# Patient Record
Sex: Female | Born: 1955 | Race: White | Hispanic: No | Smoking: Current every day smoker
Health system: Southern US, Community
[De-identification: ages and names within clinical notes are randomized; demographics above are authoritative.]

## PROBLEM LIST (undated history)

## (undated) DIAGNOSIS — I10 Essential (primary) hypertension: Secondary | ICD-10-CM

## (undated) DIAGNOSIS — R51 Headache: Secondary | ICD-10-CM

## (undated) DIAGNOSIS — M1990 Unspecified osteoarthritis, unspecified site: Secondary | ICD-10-CM

## (undated) DIAGNOSIS — E669 Obesity, unspecified: Secondary | ICD-10-CM

## (undated) DIAGNOSIS — G8929 Other chronic pain: Secondary | ICD-10-CM

## (undated) DIAGNOSIS — I5032 Chronic diastolic (congestive) heart failure: Secondary | ICD-10-CM

## (undated) DIAGNOSIS — R079 Chest pain, unspecified: Secondary | ICD-10-CM

## (undated) DIAGNOSIS — J40 Bronchitis, not specified as acute or chronic: Secondary | ICD-10-CM

## (undated) DIAGNOSIS — J449 Chronic obstructive pulmonary disease, unspecified: Secondary | ICD-10-CM

## (undated) DIAGNOSIS — I219 Acute myocardial infarction, unspecified: Secondary | ICD-10-CM

## (undated) DIAGNOSIS — R0602 Shortness of breath: Secondary | ICD-10-CM

## (undated) DIAGNOSIS — Z559 Problems related to education and literacy, unspecified: Secondary | ICD-10-CM

## (undated) DIAGNOSIS — K219 Gastro-esophageal reflux disease without esophagitis: Secondary | ICD-10-CM

## (undated) DIAGNOSIS — F419 Anxiety disorder, unspecified: Secondary | ICD-10-CM

## (undated) DIAGNOSIS — J45909 Unspecified asthma, uncomplicated: Secondary | ICD-10-CM

## (undated) DIAGNOSIS — Z550 Illiteracy and low-level literacy: Secondary | ICD-10-CM

## (undated) DIAGNOSIS — E039 Hypothyroidism, unspecified: Secondary | ICD-10-CM

## (undated) DIAGNOSIS — R519 Headache, unspecified: Secondary | ICD-10-CM

## (undated) HISTORY — PX: ECTOPIC PREGNANCY SURGERY: SHX613

## (undated) HISTORY — PX: NM MYOVIEW LTD: HXRAD82

## (undated) HISTORY — DX: Obesity, unspecified: E66.9

## (undated) HISTORY — DX: Problems related to education and literacy, unspecified: Z55.9

## (undated) HISTORY — DX: Illiteracy and low-level literacy: Z55.0

## (undated) HISTORY — DX: Acute myocardial infarction, unspecified: I21.9

---

## 1995-12-17 DIAGNOSIS — I219 Acute myocardial infarction, unspecified: Secondary | ICD-10-CM

## 1995-12-17 HISTORY — DX: Acute myocardial infarction, unspecified: I21.9

## 1999-09-30 ENCOUNTER — Emergency Department (HOSPITAL_COMMUNITY): Admission: EM | Admit: 1999-09-30 | Discharge: 1999-09-30 | Payer: Self-pay | Admitting: Emergency Medicine

## 1999-10-06 ENCOUNTER — Emergency Department (HOSPITAL_COMMUNITY): Admission: EM | Admit: 1999-10-06 | Discharge: 1999-10-06 | Payer: Self-pay | Admitting: Emergency Medicine

## 1999-11-03 ENCOUNTER — Emergency Department (HOSPITAL_COMMUNITY): Admission: EM | Admit: 1999-11-03 | Discharge: 1999-11-03 | Payer: Self-pay | Admitting: Emergency Medicine

## 1999-11-03 ENCOUNTER — Encounter: Payer: Self-pay | Admitting: Emergency Medicine

## 2000-11-05 ENCOUNTER — Encounter: Admission: RE | Admit: 2000-11-05 | Discharge: 2000-11-05 | Payer: Self-pay | Admitting: Internal Medicine

## 2001-03-02 ENCOUNTER — Emergency Department (HOSPITAL_COMMUNITY): Admission: EM | Admit: 2001-03-02 | Discharge: 2001-03-02 | Payer: Self-pay | Admitting: Emergency Medicine

## 2001-03-02 ENCOUNTER — Encounter: Payer: Self-pay | Admitting: Emergency Medicine

## 2004-10-22 ENCOUNTER — Emergency Department (HOSPITAL_COMMUNITY): Admission: EM | Admit: 2004-10-22 | Discharge: 2004-10-23 | Payer: Self-pay

## 2010-12-16 HISTORY — PX: CARDIAC CATHETERIZATION: SHX172

## 2011-01-17 ENCOUNTER — Emergency Department (HOSPITAL_COMMUNITY)
Admission: EM | Admit: 2011-01-17 | Discharge: 2011-01-18 | Disposition: A | Discharge status: Home / Self Care | Payer: Self-pay | Attending: Emergency Medicine | Admitting: Emergency Medicine

## 2011-01-17 ENCOUNTER — Emergency Department (HOSPITAL_COMMUNITY): Payer: Self-pay

## 2011-01-17 DIAGNOSIS — J40 Bronchitis, not specified as acute or chronic: Secondary | ICD-10-CM | POA: Insufficient documentation

## 2011-01-17 DIAGNOSIS — R0789 Other chest pain: Secondary | ICD-10-CM | POA: Insufficient documentation

## 2011-01-17 DIAGNOSIS — J9801 Acute bronchospasm: Secondary | ICD-10-CM | POA: Insufficient documentation

## 2011-01-17 DIAGNOSIS — R059 Cough, unspecified: Secondary | ICD-10-CM | POA: Insufficient documentation

## 2011-01-17 DIAGNOSIS — R0602 Shortness of breath: Secondary | ICD-10-CM | POA: Insufficient documentation

## 2011-01-17 DIAGNOSIS — R05 Cough: Secondary | ICD-10-CM | POA: Insufficient documentation

## 2011-01-17 LAB — POCT CARDIAC MARKERS
CKMB, poc: <1 ng/mL — ABNORMAL LOW (ref 1.0–8.0)
CKMB, poc: <1 ng/mL — ABNORMAL LOW (ref 1.0–8.0)
Myoglobin, poc: 32.5 ng/mL (ref 12–200)
Myoglobin, poc: 37.2 ng/mL (ref 12–200)
Troponin i, poc: <0.05 ng/mL (ref 0.00–0.09)
Troponin i, poc: <0.05 ng/mL (ref 0.00–0.09)

## 2011-01-19 ENCOUNTER — Emergency Department (HOSPITAL_COMMUNITY)
Admission: EM | Admit: 2011-01-19 | Discharge: 2011-01-20 | Disposition: A | Discharge status: Home / Self Care | Payer: Self-pay | Attending: Emergency Medicine | Admitting: Emergency Medicine

## 2011-01-19 DIAGNOSIS — R071 Chest pain on breathing: Secondary | ICD-10-CM | POA: Insufficient documentation

## 2011-01-19 DIAGNOSIS — I252 Old myocardial infarction: Secondary | ICD-10-CM | POA: Insufficient documentation

## 2011-03-03 ENCOUNTER — Emergency Department (HOSPITAL_COMMUNITY): Payer: Self-pay

## 2011-03-03 ENCOUNTER — Emergency Department (HOSPITAL_COMMUNITY)
Admission: EM | Admit: 2011-03-03 | Discharge: 2011-03-03 | Disposition: A | Discharge status: Home / Self Care | Payer: Self-pay | Attending: Emergency Medicine | Admitting: Emergency Medicine

## 2011-03-03 DIAGNOSIS — S40029A Contusion of unspecified upper arm, initial encounter: Secondary | ICD-10-CM | POA: Insufficient documentation

## 2011-03-03 DIAGNOSIS — W1809XA Striking against other object with subsequent fall, initial encounter: Secondary | ICD-10-CM | POA: Insufficient documentation

## 2011-03-03 DIAGNOSIS — M79609 Pain in unspecified limb: Secondary | ICD-10-CM | POA: Insufficient documentation

## 2011-03-03 DIAGNOSIS — I519 Heart disease, unspecified: Secondary | ICD-10-CM | POA: Insufficient documentation

## 2011-04-17 ENCOUNTER — Emergency Department (HOSPITAL_COMMUNITY)
Admission: EM | Admit: 2011-04-17 | Discharge: 2011-04-17 | Disposition: A | Discharge status: Home / Self Care | Payer: Self-pay | Attending: Emergency Medicine | Admitting: Emergency Medicine

## 2011-04-17 DIAGNOSIS — IMO0002 Reserved for concepts with insufficient information to code with codable children: Secondary | ICD-10-CM | POA: Insufficient documentation

## 2011-04-17 DIAGNOSIS — S90569A Insect bite (nonvenomous), unspecified ankle, initial encounter: Secondary | ICD-10-CM | POA: Insufficient documentation

## 2011-04-17 DIAGNOSIS — I252 Old myocardial infarction: Secondary | ICD-10-CM | POA: Insufficient documentation

## 2011-04-18 LAB — GLUCOSE, CAPILLARY: Glucose-Capillary: 127 mg/dL — ABNORMAL HIGH (ref 70–99)

## 2011-04-24 ENCOUNTER — Emergency Department (HOSPITAL_COMMUNITY)
Admission: EM | Admit: 2011-04-24 | Discharge: 2011-04-24 | Disposition: A | Discharge status: Home / Self Care | Payer: Self-pay | Attending: Emergency Medicine | Admitting: Emergency Medicine

## 2011-04-24 DIAGNOSIS — B86 Scabies: Secondary | ICD-10-CM | POA: Insufficient documentation

## 2011-04-24 DIAGNOSIS — L0291 Cutaneous abscess, unspecified: Secondary | ICD-10-CM | POA: Insufficient documentation

## 2011-04-24 DIAGNOSIS — L0390 Cellulitis, unspecified: Secondary | ICD-10-CM | POA: Insufficient documentation

## 2011-04-24 DIAGNOSIS — I252 Old myocardial infarction: Secondary | ICD-10-CM | POA: Insufficient documentation

## 2011-04-24 DIAGNOSIS — I2510 Atherosclerotic heart disease of native coronary artery without angina pectoris: Secondary | ICD-10-CM | POA: Insufficient documentation

## 2011-06-03 ENCOUNTER — Emergency Department (HOSPITAL_COMMUNITY)
Admission: EM | Admit: 2011-06-03 | Discharge: 2011-06-04 | Disposition: A | Discharge status: Home / Self Care | Payer: Self-pay | Attending: Emergency Medicine | Admitting: Emergency Medicine

## 2011-06-03 DIAGNOSIS — I252 Old myocardial infarction: Secondary | ICD-10-CM | POA: Insufficient documentation

## 2011-06-03 DIAGNOSIS — R079 Chest pain, unspecified: Secondary | ICD-10-CM | POA: Insufficient documentation

## 2011-06-03 DIAGNOSIS — I2510 Atherosclerotic heart disease of native coronary artery without angina pectoris: Secondary | ICD-10-CM | POA: Insufficient documentation

## 2011-06-03 DIAGNOSIS — L0231 Cutaneous abscess of buttock: Secondary | ICD-10-CM | POA: Insufficient documentation

## 2011-06-03 DIAGNOSIS — R0602 Shortness of breath: Secondary | ICD-10-CM | POA: Insufficient documentation

## 2011-06-03 DIAGNOSIS — L03317 Cellulitis of buttock: Secondary | ICD-10-CM | POA: Insufficient documentation

## 2011-06-04 ENCOUNTER — Emergency Department (HOSPITAL_COMMUNITY): Payer: Self-pay

## 2011-06-04 LAB — DIFFERENTIAL
Basophils Relative: 1 % (ref 0–1)
Eosinophils Absolute: 0.3 10*3/uL (ref 0.0–0.7)
Eosinophils Relative: 3 % (ref 0–5)
Lymphs Abs: 2.3 10*3/uL (ref 0.7–4.0)

## 2011-06-04 LAB — CBC
MCH: 27.9 pg (ref 26.0–34.0)
MCV: 84.9 fL (ref 78.0–100.0)
Platelets: 266 10*3/uL (ref 150–400)
RDW: 14.3 % (ref 11.5–15.5)
WBC: 8.8 10*3/uL (ref 4.0–10.5)

## 2011-06-04 LAB — TROPONIN I: Troponin I: <0.3 ng/mL (ref <0.30)

## 2011-06-04 LAB — POCT I-STAT, CHEM 8
Calcium, Ion: 1.09 mmol/L — ABNORMAL LOW (ref 1.12–1.32)
Chloride: 105 mEq/L (ref 96–112)
Glucose, Bld: 96 mg/dL (ref 70–99)
HCT: 42 % (ref 36.0–46.0)
TCO2: 24 mmol/L (ref 0–100)

## 2011-06-05 ENCOUNTER — Other Ambulatory Visit (HOSPITAL_COMMUNITY): Payer: Self-pay | Admitting: Cardiology

## 2011-06-07 ENCOUNTER — Emergency Department (HOSPITAL_COMMUNITY)
Admission: EM | Admit: 2011-06-07 | Discharge: 2011-06-07 | Disposition: A | Discharge status: Home / Self Care | Payer: Self-pay | Attending: Emergency Medicine | Admitting: Emergency Medicine

## 2011-06-07 ENCOUNTER — Ambulatory Visit (HOSPITAL_COMMUNITY): Payer: Self-pay

## 2011-06-07 ENCOUNTER — Encounter (HOSPITAL_COMMUNITY)
Admission: RE | Admit: 2011-06-07 | Discharge: 2011-06-07 | Disposition: A | Discharge status: Home / Self Care | Payer: Self-pay | Source: Ambulatory Visit | Attending: Cardiology | Admitting: Cardiology

## 2011-06-07 DIAGNOSIS — R079 Chest pain, unspecified: Secondary | ICD-10-CM | POA: Insufficient documentation

## 2011-06-07 DIAGNOSIS — I252 Old myocardial infarction: Secondary | ICD-10-CM | POA: Insufficient documentation

## 2011-06-07 DIAGNOSIS — L0231 Cutaneous abscess of buttock: Secondary | ICD-10-CM | POA: Insufficient documentation

## 2011-06-07 DIAGNOSIS — I2510 Atherosclerotic heart disease of native coronary artery without angina pectoris: Secondary | ICD-10-CM | POA: Insufficient documentation

## 2011-06-07 MED ORDER — TECHNETIUM TC 99M TETROFOSMIN IV KIT
30.0000 | PACK | Freq: Once | INTRAVENOUS | Status: AC | PRN
  Administered 2011-06-07: 30 via INTRAVENOUS

## 2011-06-07 MED ORDER — TECHNETIUM TC 99M TETROFOSMIN IV KIT
10.0000 | PACK | Freq: Once | INTRAVENOUS | Status: AC | PRN
  Administered 2011-06-07: 10 via INTRAVENOUS

## 2011-06-29 ENCOUNTER — Emergency Department (HOSPITAL_COMMUNITY): Payer: Self-pay

## 2011-06-29 ENCOUNTER — Emergency Department (HOSPITAL_COMMUNITY)
Admission: EM | Admit: 2011-06-29 | Discharge: 2011-06-29 | Disposition: A | Discharge status: Home / Self Care | Payer: Self-pay | Attending: Emergency Medicine | Admitting: Emergency Medicine

## 2011-06-29 DIAGNOSIS — I252 Old myocardial infarction: Secondary | ICD-10-CM | POA: Insufficient documentation

## 2011-06-29 DIAGNOSIS — R0609 Other forms of dyspnea: Secondary | ICD-10-CM | POA: Insufficient documentation

## 2011-06-29 DIAGNOSIS — R0989 Other specified symptoms and signs involving the circulatory and respiratory systems: Secondary | ICD-10-CM | POA: Insufficient documentation

## 2011-06-29 DIAGNOSIS — I2510 Atherosclerotic heart disease of native coronary artery without angina pectoris: Secondary | ICD-10-CM | POA: Insufficient documentation

## 2011-06-29 DIAGNOSIS — I4949 Other premature depolarization: Secondary | ICD-10-CM | POA: Insufficient documentation

## 2011-06-29 LAB — COMPREHENSIVE METABOLIC PANEL
ALT: 17 U/L (ref 0–35)
AST: 26 U/L (ref 0–37)
Albumin: 4.1 g/dL (ref 3.5–5.2)
Calcium: 9.3 mg/dL (ref 8.4–10.5)
Sodium: 136 mEq/L (ref 135–145)
Total Protein: 7.5 g/dL (ref 6.0–8.3)

## 2011-06-29 LAB — PRO B NATRIURETIC PEPTIDE: Pro B Natriuretic peptide (BNP): 89.3 pg/mL (ref 0–125)

## 2011-06-29 LAB — CBC
MCH: 27.4 pg (ref 26.0–34.0)
MCHC: 32.4 g/dL (ref 30.0–36.0)
Platelets: 251 10*3/uL (ref 150–400)
RBC: 4.64 MIL/uL (ref 3.87–5.11)

## 2011-06-30 ENCOUNTER — Emergency Department (HOSPITAL_COMMUNITY): Payer: Self-pay

## 2011-06-30 ENCOUNTER — Emergency Department (HOSPITAL_COMMUNITY)
Admission: EM | Admit: 2011-06-30 | Discharge: 2011-06-30 | Disposition: A | Discharge status: Home / Self Care | Payer: Self-pay | Attending: Emergency Medicine | Admitting: Emergency Medicine

## 2011-06-30 DIAGNOSIS — R5381 Other malaise: Secondary | ICD-10-CM | POA: Insufficient documentation

## 2011-06-30 DIAGNOSIS — R5383 Other fatigue: Secondary | ICD-10-CM | POA: Insufficient documentation

## 2011-06-30 DIAGNOSIS — R079 Chest pain, unspecified: Secondary | ICD-10-CM | POA: Insufficient documentation

## 2011-06-30 DIAGNOSIS — I252 Old myocardial infarction: Secondary | ICD-10-CM | POA: Insufficient documentation

## 2011-06-30 DIAGNOSIS — I2510 Atherosclerotic heart disease of native coronary artery without angina pectoris: Secondary | ICD-10-CM | POA: Insufficient documentation

## 2011-06-30 DIAGNOSIS — R0602 Shortness of breath: Secondary | ICD-10-CM | POA: Insufficient documentation

## 2011-06-30 LAB — URINALYSIS, ROUTINE W REFLEX MICROSCOPIC
Glucose, UA: NEGATIVE mg/dL
Protein, ur: NEGATIVE mg/dL
Specific Gravity, Urine: 1.006 (ref 1.005–1.030)
pH: 7.5 (ref 5.0–8.0)

## 2011-06-30 LAB — URINE MICROSCOPIC-ADD ON: Urine-Other: NONE SEEN

## 2011-06-30 LAB — TROPONIN I: Troponin I: <0.3 ng/mL (ref <0.30)

## 2011-06-30 LAB — D-DIMER, QUANTITATIVE: D-Dimer, Quant: 0.42 ug/mL-FEU (ref 0.00–0.48)

## 2011-07-03 ENCOUNTER — Ambulatory Visit: Payer: Self-pay | Admitting: Family Medicine

## 2011-07-04 ENCOUNTER — Other Ambulatory Visit: Payer: Self-pay | Admitting: Family Medicine

## 2011-07-04 ENCOUNTER — Encounter: Payer: Self-pay | Admitting: Family Medicine

## 2011-07-04 ENCOUNTER — Ambulatory Visit (INDEPENDENT_AMBULATORY_CARE_PROVIDER_SITE_OTHER): Payer: Self-pay | Admitting: Family Medicine

## 2011-07-04 VITALS — BP 121/80 | HR 73 | Temp 97.7°F | Ht 63.6 in | Wt 223.0 lb

## 2011-07-04 DIAGNOSIS — I252 Old myocardial infarction: Secondary | ICD-10-CM

## 2011-07-04 DIAGNOSIS — E785 Hyperlipidemia, unspecified: Secondary | ICD-10-CM

## 2011-07-04 DIAGNOSIS — Z8249 Family history of ischemic heart disease and other diseases of the circulatory system: Secondary | ICD-10-CM

## 2011-07-04 DIAGNOSIS — R7989 Other specified abnormal findings of blood chemistry: Secondary | ICD-10-CM

## 2011-07-04 DIAGNOSIS — E669 Obesity, unspecified: Secondary | ICD-10-CM

## 2011-07-04 DIAGNOSIS — R6889 Other general symptoms and signs: Secondary | ICD-10-CM

## 2011-07-04 DIAGNOSIS — R002 Palpitations: Secondary | ICD-10-CM

## 2011-07-04 LAB — CBC WITH DIFFERENTIAL/PLATELET
Basophils Absolute: 0 10*3/uL (ref 0.0–0.1)
Basophils Relative: 1 % (ref 0–1)
Eosinophils Relative: 5 % (ref 0–5)
HCT: 42.2 % (ref 36.0–46.0)
Lymphocytes Relative: 32 % (ref 12–46)
MCHC: 32.5 g/dL (ref 30.0–36.0)
MCV: 86.5 fL (ref 78.0–100.0)
Monocytes Absolute: 0.5 10*3/uL (ref 0.1–1.0)
Platelets: 280 10*3/uL (ref 150–400)
RDW: 14.9 % (ref 11.5–15.5)
WBC: 5.9 10*3/uL (ref 4.0–10.5)

## 2011-07-04 LAB — COMPREHENSIVE METABOLIC PANEL
AST: 16 U/L (ref 0–37)
Albumin: 4.3 g/dL (ref 3.5–5.2)
Alkaline Phosphatase: 79 U/L (ref 39–117)
BUN: 15 mg/dL (ref 6–23)
Creat: 0.68 mg/dL (ref 0.50–1.10)
Glucose, Bld: 81 mg/dL (ref 70–99)
Total Bilirubin: 0.5 mg/dL (ref 0.3–1.2)

## 2011-07-04 LAB — LIPID PANEL
HDL: 53 mg/dL (ref >39)
LDL Cholesterol: 146 mg/dL — ABNORMAL HIGH (ref 0–99)
Total CHOL/HDL Ratio: 4.4 Ratio
Triglycerides: 160 mg/dL — ABNORMAL HIGH (ref <150)
VLDL: 32 mg/dL (ref 0–40)

## 2011-07-04 MED ORDER — METOPROLOL TARTRATE 25 MG PO TABS: 25.0000 mg | ORAL_TABLET | Freq: Two times a day (BID) | ORAL | Status: DC

## 2011-07-04 NOTE — Progress Notes (Signed)
  Subjective:    Patient ID: [name], female    DOB: [birth year], 55 y.o.   MRN: [id number]  HPI Here to establish primary care.  She states she has not seen a doctor in 16 years.  She feels is it time to establish primary care because she is getting older and has collected a myriad of symptoms and would like to feel better.  Cardiac: States she had a heart attack over a decade ago that placed her in the hospital for 18 days.  It sounds may have been A fib? Unclear if was actually an MI. Continues to have periods of palpitation, no chest pain, dyspnea, nausea.  I reviewed last EKG from ER visit, no changes.  Has nucelar stress test which was normal, she has been in ER multiples in past year for different issues.    Review of Systems  Constitutional: Negative for fever and chills.  HENT: Negative for hearing loss and sore throat.   Eyes: Negative for visual disturbance.  Respiratory: Positive for cough and shortness of breath.   Cardiovascular: Positive for palpitations. Negative for chest pain.  Gastrointestinal: Negative for nausea, abdominal pain, diarrhea and constipation.  Genitourinary: Negative for dysuria.  Musculoskeletal: Negative for back pain.  Neurological: Positive for dizziness and light-headedness. Negative for numbness.  Hematological: Bruises/bleeds easily.  Psychiatric/Behavioral: Negative for dysphoric mood. The patient is not nervous/anxious.        Objective:   Physical Exam    GEN: Alert & Oriented, No acute distress, low health literacy CV:  Regular Rate & Rhythm, no murmur.   Respiratory:  Normal work of breathing, CTAB Abd:  + BS, soft, no tenderness to palpation Ext: no pre-tibial edema     Assessment & Plan:

## 2011-07-04 NOTE — Patient Instructions (Signed)
Take a baby aspirin (81 mg) and metoprolol daily Come back in 1 week for physical, gynecological exam and to talk more about prevention. We will review the results of your labwork then.

## 2011-07-05 ENCOUNTER — Telehealth: Payer: Self-pay

## 2011-07-05 DIAGNOSIS — I252 Old myocardial infarction: Secondary | ICD-10-CM | POA: Insufficient documentation

## 2011-07-05 DIAGNOSIS — E039 Hypothyroidism, unspecified: Secondary | ICD-10-CM

## 2011-07-05 NOTE — Progress Notes (Signed)
Addended by: Macy Mis on: 07/05/2011 10:25 AM   Modules accepted: Orders

## 2011-07-05 NOTE — Assessment & Plan Note (Addendum)
Possibly PVC vs A fib per history?  No evidence on last EKG or exam.  CHADS2 = no anticoagulation.  Will treat with low dose BB (also has questionable hx of previous MI)  Will check TSH

## 2011-07-05 NOTE — Telephone Encounter (Signed)
Is calling for lab results. °

## 2011-07-05 NOTE — Assessment & Plan Note (Signed)
Undocumented.  Will place on ASA 81 and BB.  Will check labs for risk stratification.  Fam Hx of early MI in mom

## 2011-07-08 LAB — T4, FREE: Free T4: 0.83 ng/dL (ref 0.80–1.80)

## 2011-07-09 ENCOUNTER — Emergency Department (HOSPITAL_COMMUNITY): Payer: Self-pay

## 2011-07-09 ENCOUNTER — Emergency Department (HOSPITAL_COMMUNITY)
Admission: EM | Admit: 2011-07-09 | Discharge: 2011-07-10 | Disposition: A | Discharge status: Home / Self Care | Payer: Self-pay | Attending: Emergency Medicine | Admitting: Emergency Medicine

## 2011-07-09 DIAGNOSIS — M25519 Pain in unspecified shoulder: Secondary | ICD-10-CM | POA: Insufficient documentation

## 2011-07-09 DIAGNOSIS — Z79899 Other long term (current) drug therapy: Secondary | ICD-10-CM | POA: Insufficient documentation

## 2011-07-09 DIAGNOSIS — R079 Chest pain, unspecified: Secondary | ICD-10-CM | POA: Insufficient documentation

## 2011-07-09 DIAGNOSIS — I2510 Atherosclerotic heart disease of native coronary artery without angina pectoris: Secondary | ICD-10-CM | POA: Insufficient documentation

## 2011-07-09 DIAGNOSIS — M546 Pain in thoracic spine: Secondary | ICD-10-CM | POA: Insufficient documentation

## 2011-07-09 DIAGNOSIS — I252 Old myocardial infarction: Secondary | ICD-10-CM | POA: Insufficient documentation

## 2011-07-10 LAB — D-DIMER, QUANTITATIVE: D-Dimer, Quant: 0.48 ug/mL-FEU (ref 0.00–0.48)

## 2011-07-10 LAB — BASIC METABOLIC PANEL
BUN: 19 mg/dL (ref 6–23)
CO2: 26 mEq/L (ref 19–32)
Chloride: 103 mEq/L (ref 96–112)
GFR calc non Af Amer: >60 mL/min (ref >60)
Glucose, Bld: 95 mg/dL (ref 70–99)
Potassium: 4 mEq/L (ref 3.5–5.1)
Sodium: 138 mEq/L (ref 135–145)

## 2011-07-10 LAB — CBC
MCH: 28.4 pg (ref 26.0–34.0)
MCV: 83.6 fL (ref 78.0–100.0)
Platelets: 269 10*3/uL (ref 150–400)
RBC: 4.51 MIL/uL (ref 3.87–5.11)
RDW: 14.2 % (ref 11.5–15.5)

## 2011-07-10 LAB — CK TOTAL AND CKMB (NOT AT ARMC)
CK, MB: 1.1 ng/mL (ref 0.3–4.0)
CK, MB: 1.1 ng/mL (ref 0.3–4.0)
Relative Index: INVALID (ref 0.0–2.5)

## 2011-07-10 LAB — TROPONIN I: Troponin I: <0.3 ng/mL (ref <0.30)

## 2011-07-11 ENCOUNTER — Encounter: Payer: Self-pay | Admitting: Family Medicine

## 2011-07-11 DIAGNOSIS — E039 Hypothyroidism, unspecified: Secondary | ICD-10-CM | POA: Insufficient documentation

## 2011-07-11 MED ORDER — LEVOTHYROXINE SODIUM 112 MCG PO TABS: 112.0000 ug | ORAL_TABLET | Freq: Every day | ORAL | Status: DC

## 2011-07-11 NOTE — Assessment & Plan Note (Signed)
Low normal TSH.  Technically subclinical hypothyroidism but given she is symptomatic, very high TSh puts her at risk for progressing to full hypothyroidism.  Will start synthroid today.  Fu  bloodwork in 6 weeks

## 2011-07-11 NOTE — Telephone Encounter (Signed)
Was going to talk to patient about lab results today but she rescheduled.  Spoke with ehr about high tsh, will start replacement.  She is unable to [facility] when she will have the money to pick up prescriptions  Discussed symptoms of disease, symptoms to watch for when starting therapy, recheck in 6 weeks on medication.  Patient agreeable.  We have appt scheduled for her physical as well.

## 2011-07-16 ENCOUNTER — Encounter: Payer: Self-pay | Admitting: Family Medicine

## 2011-07-19 ENCOUNTER — Encounter: Payer: Self-pay | Admitting: Family Medicine

## 2011-07-19 DIAGNOSIS — E785 Hyperlipidemia, unspecified: Secondary | ICD-10-CM | POA: Insufficient documentation

## 2011-07-19 NOTE — Assessment & Plan Note (Addendum)
Need to start statin.  Will discuss at follow-up visit.  Sent patient letter

## 2011-07-22 ENCOUNTER — Encounter: Payer: Self-pay | Admitting: Family Medicine

## 2011-07-22 ENCOUNTER — Ambulatory Visit (INDEPENDENT_AMBULATORY_CARE_PROVIDER_SITE_OTHER): Payer: Self-pay | Admitting: Family Medicine

## 2011-07-22 ENCOUNTER — Other Ambulatory Visit (HOSPITAL_COMMUNITY)
Admission: RE | Admit: 2011-07-22 | Discharge: 2011-07-22 | Disposition: A | Discharge status: Home / Self Care | Payer: Self-pay | Source: Ambulatory Visit | Attending: Family Medicine | Admitting: Family Medicine

## 2011-07-22 ENCOUNTER — Other Ambulatory Visit: Payer: Self-pay | Admitting: Family Medicine

## 2011-07-22 VITALS — BP 137/89 | HR 87 | Temp 98.9°F | Ht 63.6 in | Wt 221.0 lb

## 2011-07-22 DIAGNOSIS — E039 Hypothyroidism, unspecified: Secondary | ICD-10-CM

## 2011-07-22 DIAGNOSIS — E669 Obesity, unspecified: Secondary | ICD-10-CM

## 2011-07-22 DIAGNOSIS — Z124 Encounter for screening for malignant neoplasm of cervix: Secondary | ICD-10-CM

## 2011-07-22 DIAGNOSIS — Z01419 Encounter for gynecological examination (general) (routine) without abnormal findings: Secondary | ICD-10-CM | POA: Insufficient documentation

## 2011-07-22 DIAGNOSIS — M79609 Pain in unspecified limb: Secondary | ICD-10-CM

## 2011-07-22 DIAGNOSIS — Z1231 Encounter for screening mammogram for malignant neoplasm of breast: Secondary | ICD-10-CM

## 2011-07-22 DIAGNOSIS — Z23 Encounter for immunization: Secondary | ICD-10-CM

## 2011-07-22 DIAGNOSIS — M79603 Pain in arm, unspecified: Secondary | ICD-10-CM | POA: Insufficient documentation

## 2011-07-22 DIAGNOSIS — E785 Hyperlipidemia, unspecified: Secondary | ICD-10-CM

## 2011-07-22 MED ORDER — IBUPROFEN 600 MG PO TABS: 600.0000 mg | ORAL_TABLET | Freq: Four times a day (QID) | ORAL | Status: AC | PRN

## 2011-07-22 MED ORDER — LEVOTHYROXINE SODIUM 112 MCG PO TABS: 112.0000 ug | ORAL_TABLET | Freq: Every day | ORAL | Status: DC

## 2011-07-22 MED ORDER — TETANUS-DIPHTH-ACELL PERTUSSIS 5-2-15.5 LF-MCG/0.5 IM SUSP
0.5000 mL | Freq: Once | INTRAMUSCULAR | Status: AC
  Administered 2011-07-22: 0.5 mL via INTRAMUSCULAR

## 2011-07-22 MED ORDER — METOPROLOL TARTRATE 25 MG PO TABS: 25.0000 mg | ORAL_TABLET | Freq: Two times a day (BID) | ORAL | Status: DC

## 2011-07-22 NOTE — Assessment & Plan Note (Signed)
Pap performed today.  Given info to schedule mammogram.  She declines STD screening, is post menopausal.  Tetanus updated, unable to refer for colonoscopy at this time given uninsured.  No family history or other urgent ROS.

## 2011-07-22 NOTE — Assessment & Plan Note (Signed)
Was an added on problem, unclear if arm pain or perhaps more shoulder pathology.  Advised NSAID, ice, and gentle ROM exercises.  Will re-evaluate at next visit.

## 2011-07-22 NOTE — Assessment & Plan Note (Signed)
Still has not started synthroid.  Urged her to pick it up, at [facility] is on $4 list.

## 2011-07-22 NOTE — Assessment & Plan Note (Addendum)
Discussed her goal is lower, unclear if she had history of MI.  Would like to start statin but placed higher priority on synthroid and metoprolol.  Discussed with patient, will start at next visit once doing well on metoprolol and synthroid.  Briefly discussed lifestyle modification.

## 2011-07-22 NOTE — Patient Instructions (Signed)
Come back in 6 weeks for recheck Please get your metoprolol and levothyroxine today or tomorrow and start taking it. See info to schedule for mammogram

## 2011-07-22 NOTE — Progress Notes (Signed)
  Subjective:    Patient ID: [name], female    DOB: Mar 27, 1956, 55 y.o.   MRN: [id number]  HPI Annual Gynecological Exam  G4P4 all vaginal Wt Readings from Last 3 Encounters:  07/22/11 221 lb (100.245 kg)  07/04/11 223 lb (101.152 kg)   Last period:  1999 Regular periods: no Heavy bleeding: no  Sexually active: days ago Birth control or hormonal therapy:No Hx of STD: Patient desires STD screening Dyspareunia: No Hot flashes: sometimes Vaginal discharge:No Dysuria:No  Last mammogram: remote cannot tell me exactly Breast mass or concerns: No Last Pap: remote, cannot tell me exactly History of abnormal pap: No  FH of breast, uterine, ovarian, colon cancer: No  Hypothyroidism:  Did nto pick up medications as she thought she had to wait until he got debra hill to get the $4 list.  Palpitations:  Did not get medications.  Has continues to have some dypnea and palpitations.  Was seen at Er for this, I am unable to see full records.   Fell on right arm 2-3 months ago.  Has been sore since then.  States she went to ER and was told it was bruised after getting an xray.  Left humerus xray march 18th shows no fracture. Review of Systems See hpi    Objective:   Physical Exam GEN: Alert & Oriented, No acute distress CV:  Regular Rate & Rhythm, no murmur Respiratory:  Normal work of breathing, CTAB Abd:  + BS, soft, no tenderness to palpation Ext: no pre-tibial edema Pelvic Exam:        External: normal female genitalia without lesions or masses        Vagina: normal without lesions or masses        Cervix: normal without lesions or masses        Adnexa: normal bimanual exam without masses or fullness        Uterus: normal by palpation        Pap smear: performed  Left arm:  Tender to palpation over left upper humerus.  Not specifically over bicipital tendon.  Difficulty with range of motion given pain.  Points to upper arm as site of pain.      Assessment & Plan:

## 2011-07-24 ENCOUNTER — Emergency Department (HOSPITAL_COMMUNITY)
Admission: EM | Admit: 2011-07-24 | Discharge: 2011-07-24 | Disposition: A | Discharge status: Home / Self Care | Payer: Self-pay | Attending: Emergency Medicine | Admitting: Emergency Medicine

## 2011-07-24 DIAGNOSIS — I2510 Atherosclerotic heart disease of native coronary artery without angina pectoris: Secondary | ICD-10-CM | POA: Insufficient documentation

## 2011-07-24 DIAGNOSIS — Z87828 Personal history of other (healed) physical injury and trauma: Secondary | ICD-10-CM | POA: Insufficient documentation

## 2011-07-24 DIAGNOSIS — M79609 Pain in unspecified limb: Secondary | ICD-10-CM | POA: Insufficient documentation

## 2011-07-24 DIAGNOSIS — I252 Old myocardial infarction: Secondary | ICD-10-CM | POA: Insufficient documentation

## 2011-07-24 DIAGNOSIS — Z7982 Long term (current) use of aspirin: Secondary | ICD-10-CM | POA: Insufficient documentation

## 2011-07-25 ENCOUNTER — Telehealth: Payer: Self-pay

## 2011-07-25 ENCOUNTER — Encounter: Payer: Self-pay | Admitting: Family Medicine

## 2011-07-25 NOTE — Telephone Encounter (Signed)
Pt has the orange card and needs a cardiologist that accepts [facility] - can't go to the one she had been going to b/c she can't afford it

## 2011-07-26 ENCOUNTER — Ambulatory Visit (HOSPITAL_COMMUNITY)
Admission: RE | Admit: 2011-07-26 | Discharge: 2011-07-26 | Disposition: A | Discharge status: Home / Self Care | Payer: Self-pay | Source: Ambulatory Visit | Attending: Family Medicine | Admitting: Family Medicine

## 2011-07-26 DIAGNOSIS — Z1231 Encounter for screening mammogram for malignant neoplasm of breast: Secondary | ICD-10-CM | POA: Insufficient documentation

## 2011-07-29 NOTE — Telephone Encounter (Signed)
Informed patient of below.

## 2011-07-29 NOTE — Telephone Encounter (Signed)
Please let patient know that we will discuss need for cardiology referal at next appointment.  Does not need urgently at this time.

## 2011-07-29 NOTE — Telephone Encounter (Signed)
Dr. Earnest Bailey, Can you put a referral in for cardiologist for this patient ---[place]

## 2011-08-01 ENCOUNTER — Other Ambulatory Visit: Payer: Self-pay | Admitting: Family Medicine

## 2011-08-01 DIAGNOSIS — R928 Other abnormal and inconclusive findings on diagnostic imaging of breast: Secondary | ICD-10-CM

## 2011-08-04 ENCOUNTER — Emergency Department (HOSPITAL_COMMUNITY)
Admission: EM | Admit: 2011-08-04 | Discharge: 2011-08-04 | Disposition: A | Discharge status: Home / Self Care | Payer: Self-pay | Attending: Emergency Medicine | Admitting: Emergency Medicine

## 2011-08-04 DIAGNOSIS — R059 Cough, unspecified: Secondary | ICD-10-CM | POA: Insufficient documentation

## 2011-08-04 DIAGNOSIS — R079 Chest pain, unspecified: Secondary | ICD-10-CM | POA: Insufficient documentation

## 2011-08-04 DIAGNOSIS — R05 Cough: Secondary | ICD-10-CM | POA: Insufficient documentation

## 2011-08-04 DIAGNOSIS — I252 Old myocardial infarction: Secondary | ICD-10-CM | POA: Insufficient documentation

## 2011-08-04 DIAGNOSIS — R42 Dizziness and giddiness: Secondary | ICD-10-CM | POA: Insufficient documentation

## 2011-08-04 DIAGNOSIS — N390 Urinary tract infection, site not specified: Secondary | ICD-10-CM | POA: Insufficient documentation

## 2011-08-04 DIAGNOSIS — Z79899 Other long term (current) drug therapy: Secondary | ICD-10-CM | POA: Insufficient documentation

## 2011-08-04 DIAGNOSIS — I2510 Atherosclerotic heart disease of native coronary artery without angina pectoris: Secondary | ICD-10-CM | POA: Insufficient documentation

## 2011-08-04 DIAGNOSIS — R209 Unspecified disturbances of skin sensation: Secondary | ICD-10-CM | POA: Insufficient documentation

## 2011-08-04 DIAGNOSIS — M549 Dorsalgia, unspecified: Secondary | ICD-10-CM | POA: Insufficient documentation

## 2011-08-04 DIAGNOSIS — H538 Other visual disturbances: Secondary | ICD-10-CM | POA: Insufficient documentation

## 2011-08-04 LAB — URINALYSIS, ROUTINE W REFLEX MICROSCOPIC
Hgb urine dipstick: NEGATIVE
Nitrite: NEGATIVE
Protein, ur: NEGATIVE mg/dL
Specific Gravity, Urine: 1.007 (ref 1.005–1.030)
Urobilinogen, UA: 0.2 mg/dL (ref 0.0–1.0)

## 2011-08-04 LAB — POCT I-STAT, CHEM 8
BUN: 15 mg/dL (ref 6–23)
Creatinine, Ser: 0.8 mg/dL (ref 0.50–1.10)
Glucose, Bld: 81 mg/dL (ref 70–99)
Hemoglobin: 14.3 g/dL (ref 12.0–15.0)
Sodium: 141 mEq/L (ref 135–145)
TCO2: 26 mmol/L (ref 0–100)

## 2011-08-04 LAB — URINE MICROSCOPIC-ADD ON

## 2011-08-04 LAB — POCT I-STAT TROPONIN I

## 2011-08-09 ENCOUNTER — Ambulatory Visit
Admission: RE | Admit: 2011-08-09 | Discharge: 2011-08-09 | Disposition: A | Discharge status: Home / Self Care | Payer: No Typology Code available for payment source | Source: Ambulatory Visit | Attending: Family Medicine | Admitting: Family Medicine

## 2011-08-09 DIAGNOSIS — R928 Other abnormal and inconclusive findings on diagnostic imaging of breast: Secondary | ICD-10-CM

## 2011-08-11 ENCOUNTER — Emergency Department (HOSPITAL_COMMUNITY): Payer: Self-pay

## 2011-08-11 ENCOUNTER — Emergency Department (HOSPITAL_COMMUNITY)
Admission: EM | Admit: 2011-08-11 | Discharge: 2011-08-12 | Disposition: A | Discharge status: Home / Self Care | Payer: Self-pay | Attending: Emergency Medicine | Admitting: Emergency Medicine

## 2011-08-11 DIAGNOSIS — M79609 Pain in unspecified limb: Secondary | ICD-10-CM | POA: Insufficient documentation

## 2011-08-11 DIAGNOSIS — E039 Hypothyroidism, unspecified: Secondary | ICD-10-CM | POA: Insufficient documentation

## 2011-08-11 DIAGNOSIS — I252 Old myocardial infarction: Secondary | ICD-10-CM | POA: Insufficient documentation

## 2011-08-11 DIAGNOSIS — I2510 Atherosclerotic heart disease of native coronary artery without angina pectoris: Secondary | ICD-10-CM | POA: Insufficient documentation

## 2011-08-11 DIAGNOSIS — I10 Essential (primary) hypertension: Secondary | ICD-10-CM | POA: Insufficient documentation

## 2011-08-11 DIAGNOSIS — M25519 Pain in unspecified shoulder: Secondary | ICD-10-CM | POA: Insufficient documentation

## 2011-08-15 ENCOUNTER — Emergency Department (HOSPITAL_COMMUNITY)
Admission: EM | Admit: 2011-08-15 | Discharge: 2011-08-16 | Disposition: A | Discharge status: Home / Self Care | Payer: Self-pay | Attending: Emergency Medicine | Admitting: Emergency Medicine

## 2011-08-15 DIAGNOSIS — I252 Old myocardial infarction: Secondary | ICD-10-CM | POA: Insufficient documentation

## 2011-08-15 DIAGNOSIS — L299 Pruritus, unspecified: Secondary | ICD-10-CM | POA: Insufficient documentation

## 2011-08-15 DIAGNOSIS — E039 Hypothyroidism, unspecified: Secondary | ICD-10-CM | POA: Insufficient documentation

## 2011-08-15 DIAGNOSIS — I10 Essential (primary) hypertension: Secondary | ICD-10-CM | POA: Insufficient documentation

## 2011-08-15 DIAGNOSIS — L03119 Cellulitis of unspecified part of limb: Secondary | ICD-10-CM | POA: Insufficient documentation

## 2011-08-15 DIAGNOSIS — E7800 Pure hypercholesterolemia, unspecified: Secondary | ICD-10-CM | POA: Insufficient documentation

## 2011-08-15 DIAGNOSIS — R21 Rash and other nonspecific skin eruption: Secondary | ICD-10-CM | POA: Insufficient documentation

## 2011-08-15 DIAGNOSIS — Z79899 Other long term (current) drug therapy: Secondary | ICD-10-CM | POA: Insufficient documentation

## 2011-08-15 DIAGNOSIS — I2510 Atherosclerotic heart disease of native coronary artery without angina pectoris: Secondary | ICD-10-CM | POA: Insufficient documentation

## 2011-08-15 DIAGNOSIS — L02619 Cutaneous abscess of unspecified foot: Secondary | ICD-10-CM | POA: Insufficient documentation

## 2011-08-15 DIAGNOSIS — R229 Localized swelling, mass and lump, unspecified: Secondary | ICD-10-CM | POA: Insufficient documentation

## 2011-08-16 LAB — POCT I-STAT, CHEM 8
BUN: 13 mg/dL (ref 6–23)
Calcium, Ion: 1.15 mmol/L (ref 1.12–1.32)
Chloride: 105 mEq/L (ref 96–112)
Creatinine, Ser: 0.8 mg/dL (ref 0.50–1.10)
Sodium: 141 mEq/L (ref 135–145)
TCO2: 24 mmol/L (ref 0–100)

## 2011-08-16 LAB — RPR: RPR Ser Ql: NONREACTIVE

## 2011-08-20 ENCOUNTER — Emergency Department (HOSPITAL_COMMUNITY): Payer: Self-pay

## 2011-08-20 ENCOUNTER — Emergency Department (HOSPITAL_COMMUNITY)
Admission: EM | Admit: 2011-08-20 | Discharge: 2011-08-20 | Disposition: A | Discharge status: Home / Self Care | Payer: Self-pay | Attending: Emergency Medicine | Admitting: Emergency Medicine

## 2011-08-20 DIAGNOSIS — W010XXA Fall on same level from slipping, tripping and stumbling without subsequent striking against object, initial encounter: Secondary | ICD-10-CM | POA: Insufficient documentation

## 2011-08-20 DIAGNOSIS — R112 Nausea with vomiting, unspecified: Secondary | ICD-10-CM | POA: Insufficient documentation

## 2011-08-20 DIAGNOSIS — R51 Headache: Secondary | ICD-10-CM | POA: Insufficient documentation

## 2011-08-20 DIAGNOSIS — I252 Old myocardial infarction: Secondary | ICD-10-CM | POA: Insufficient documentation

## 2011-08-20 DIAGNOSIS — E039 Hypothyroidism, unspecified: Secondary | ICD-10-CM | POA: Insufficient documentation

## 2011-08-20 DIAGNOSIS — S40019A Contusion of unspecified shoulder, initial encounter: Secondary | ICD-10-CM | POA: Insufficient documentation

## 2011-08-20 DIAGNOSIS — I10 Essential (primary) hypertension: Secondary | ICD-10-CM | POA: Insufficient documentation

## 2011-08-20 LAB — POCT I-STAT, CHEM 8
HCT: 43 % (ref 36.0–46.0)
Hemoglobin: 14.6 g/dL (ref 12.0–15.0)
Potassium: 4.1 mEq/L (ref 3.5–5.1)
Sodium: 139 mEq/L (ref 135–145)

## 2011-08-25 ENCOUNTER — Emergency Department (HOSPITAL_COMMUNITY): Payer: Self-pay

## 2011-08-25 ENCOUNTER — Emergency Department (HOSPITAL_COMMUNITY)
Admission: EM | Admit: 2011-08-25 | Discharge: 2011-08-26 | Disposition: A | Discharge status: Home / Self Care | Payer: Self-pay | Attending: Emergency Medicine | Admitting: Emergency Medicine

## 2011-08-25 DIAGNOSIS — I2510 Atherosclerotic heart disease of native coronary artery without angina pectoris: Secondary | ICD-10-CM | POA: Insufficient documentation

## 2011-08-25 DIAGNOSIS — R079 Chest pain, unspecified: Secondary | ICD-10-CM | POA: Insufficient documentation

## 2011-08-25 DIAGNOSIS — M25519 Pain in unspecified shoulder: Secondary | ICD-10-CM | POA: Insufficient documentation

## 2011-08-25 DIAGNOSIS — M7500 Adhesive capsulitis of unspecified shoulder: Secondary | ICD-10-CM | POA: Insufficient documentation

## 2011-08-25 DIAGNOSIS — I10 Essential (primary) hypertension: Secondary | ICD-10-CM | POA: Insufficient documentation

## 2011-08-25 DIAGNOSIS — I252 Old myocardial infarction: Secondary | ICD-10-CM | POA: Insufficient documentation

## 2011-08-25 DIAGNOSIS — E039 Hypothyroidism, unspecified: Secondary | ICD-10-CM | POA: Insufficient documentation

## 2011-08-25 DIAGNOSIS — R0602 Shortness of breath: Secondary | ICD-10-CM | POA: Insufficient documentation

## 2011-08-29 ENCOUNTER — Emergency Department (HOSPITAL_COMMUNITY): Payer: Self-pay

## 2011-08-29 ENCOUNTER — Emergency Department (HOSPITAL_COMMUNITY)
Admission: EM | Admit: 2011-08-29 | Discharge: 2011-08-30 | Disposition: A | Discharge status: Home / Self Care | Payer: Self-pay | Attending: Emergency Medicine | Admitting: Emergency Medicine

## 2011-08-29 DIAGNOSIS — R059 Cough, unspecified: Secondary | ICD-10-CM | POA: Insufficient documentation

## 2011-08-29 DIAGNOSIS — I10 Essential (primary) hypertension: Secondary | ICD-10-CM | POA: Insufficient documentation

## 2011-08-29 DIAGNOSIS — R05 Cough: Secondary | ICD-10-CM | POA: Insufficient documentation

## 2011-08-29 DIAGNOSIS — I2510 Atherosclerotic heart disease of native coronary artery without angina pectoris: Secondary | ICD-10-CM | POA: Insufficient documentation

## 2011-08-29 DIAGNOSIS — M545 Low back pain, unspecified: Secondary | ICD-10-CM | POA: Insufficient documentation

## 2011-08-29 DIAGNOSIS — E039 Hypothyroidism, unspecified: Secondary | ICD-10-CM | POA: Insufficient documentation

## 2011-08-29 DIAGNOSIS — Z87891 Personal history of nicotine dependence: Secondary | ICD-10-CM | POA: Insufficient documentation

## 2011-08-29 DIAGNOSIS — I252 Old myocardial infarction: Secondary | ICD-10-CM | POA: Insufficient documentation

## 2011-08-29 DIAGNOSIS — E7800 Pure hypercholesterolemia, unspecified: Secondary | ICD-10-CM | POA: Insufficient documentation

## 2011-08-29 DIAGNOSIS — X58XXXA Exposure to other specified factors, initial encounter: Secondary | ICD-10-CM | POA: Insufficient documentation

## 2011-08-29 DIAGNOSIS — R079 Chest pain, unspecified: Secondary | ICD-10-CM | POA: Insufficient documentation

## 2011-08-29 DIAGNOSIS — T148XXA Other injury of unspecified body region, initial encounter: Secondary | ICD-10-CM | POA: Insufficient documentation

## 2011-08-29 DIAGNOSIS — R109 Unspecified abdominal pain: Secondary | ICD-10-CM | POA: Insufficient documentation

## 2011-08-29 DIAGNOSIS — J40 Bronchitis, not specified as acute or chronic: Secondary | ICD-10-CM | POA: Insufficient documentation

## 2011-08-30 LAB — CBC
HCT: 40.4 % (ref 36.0–46.0)
Hemoglobin: 13 g/dL (ref 12.0–15.0)
RDW: 13.7 % (ref 11.5–15.5)
WBC: 6.9 10*3/uL (ref 4.0–10.5)

## 2011-08-30 LAB — BASIC METABOLIC PANEL
BUN: 13 mg/dL (ref 6–23)
Chloride: 101 mEq/L (ref 96–112)
GFR calc Af Amer: >60 mL/min (ref >60)
Glucose, Bld: 82 mg/dL (ref 70–99)
Potassium: 3.8 mEq/L (ref 3.5–5.1)
Sodium: 137 mEq/L (ref 135–145)

## 2011-08-30 LAB — DIFFERENTIAL
Basophils Absolute: 0 10*3/uL (ref 0.0–0.1)
Lymphocytes Relative: 38 % (ref 12–46)
Neutro Abs: 3.3 10*3/uL (ref 1.7–7.7)

## 2011-09-03 ENCOUNTER — Ambulatory Visit (INDEPENDENT_AMBULATORY_CARE_PROVIDER_SITE_OTHER): Payer: Self-pay | Admitting: Family Medicine

## 2011-09-03 ENCOUNTER — Encounter: Payer: Self-pay | Admitting: Family Medicine

## 2011-09-03 VITALS — BP 104/72 | HR 71 | Wt 218.5 lb

## 2011-09-03 DIAGNOSIS — E039 Hypothyroidism, unspecified: Secondary | ICD-10-CM

## 2011-09-03 DIAGNOSIS — M549 Dorsalgia, unspecified: Secondary | ICD-10-CM | POA: Insufficient documentation

## 2011-09-03 NOTE — Patient Instructions (Signed)
Make follow up in 6 weeks You agreed that it would be reasonable to start taking levothyoxine this week.  You also need metoprolol to protect your heart and a baby aspirin.  Before going to the ER, try to schedule an appt with our office.

## 2011-09-03 NOTE — Assessment & Plan Note (Addendum)
Multiple ER visits for narcotics not only for back pain but other diagnoses.  No significant pain elicited on exam today.  Advised will likely continue to have multiple aches and pain with untreated hypothyroidism and that  repeated use of ER and multiple rxes for back pain is not appropriate.  We have prioritized spending $4 to fill levothyroxine today instead of pain medications.  She elects to follow-up in 6 weeks, advised to return to clinic if has further needs as she will receive best care from [facility].

## 2011-09-03 NOTE — Progress Notes (Signed)
  Subjective:    Patient ID: [name], female    DOB: 12-31-1955, 55 y.o.   MRN: [id number]  HPI here for same day visit to discuss back pain. Did not follow-up for chronic medical conditions.  States back pain for several days.  No injury.  No radiation,  No change in bowel or bladder.  Low back.   She is not forthcoming with the fact that she was in the ER 7 times in the past two months.  When asked what imaging or medications she got in the ER, stated she did not get anything.  After visit, ER records search revealed  8/8: CC: arm pain, Rx ibuprofen 8/19: CC: lightheaded: RxCipro 8/27: arm pain: vicodin #12 8/31: rash: vicodin #10 9/4: headache: vicodin #10 9/10: shoulder pain: percocet and robaxin 9/14: Vidocin rxed but never given after provider noted multiple recent controlled substances filled which the patient did not reveal to her.  Hypothyroidism:  Has not filled synthroid.  She is aware iti s on the $4 list.  States financial difficulty.  Review of Systems See hpi    Objective:   Physical Exam GEN: NAD Back: no tenderness to palpation.  Neg straight leg raise.  Neuro:  Normal gait.  2+ patellar reflexes bilaterally       Assessment & Plan:

## 2011-09-03 NOTE — Assessment & Plan Note (Signed)
See above.  Has not filled levothyrozine.  Advised on importance, will fill and follow-up in 6 weeks.

## 2011-09-05 ENCOUNTER — Telehealth: Payer: Self-pay | Admitting: Family Medicine

## 2011-09-05 NOTE — Telephone Encounter (Signed)
Ms. Didonato has been hurting in her back and legs so bad that she can't sleep.  She would like to arrange to have some tests run to find out what this is from.  Please give her a call.  She was last seen 9/18.

## 2011-09-06 ENCOUNTER — Emergency Department (HOSPITAL_COMMUNITY)
Admission: EM | Admit: 2011-09-06 | Discharge: 2011-09-06 | Discharge status: Against Medical Advice | Payer: Self-pay | Attending: Emergency Medicine | Admitting: Emergency Medicine

## 2011-09-06 DIAGNOSIS — Z0389 Encounter for observation for other suspected diseases and conditions ruled out: Secondary | ICD-10-CM | POA: Insufficient documentation

## 2011-09-06 LAB — URINE MICROSCOPIC-ADD ON

## 2011-09-06 LAB — URINALYSIS, ROUTINE W REFLEX MICROSCOPIC
Bilirubin Urine: NEGATIVE
Nitrite: NEGATIVE
Specific Gravity, Urine: 1.004 — ABNORMAL LOW (ref 1.005–1.030)
pH: 6 (ref 5.0–8.0)

## 2011-09-06 NOTE — Telephone Encounter (Signed)
[name] called back because she hadn't heard back from Dr. Earnest Bailey.  The pain is now so bad, she decided to make an appt to be seen and is scheduled for Monday morning.

## 2011-09-07 ENCOUNTER — Emergency Department (HOSPITAL_COMMUNITY)
Admission: EM | Admit: 2011-09-07 | Discharge: 2011-09-07 | Disposition: A | Discharge status: Home / Self Care | Payer: Self-pay | Attending: Emergency Medicine | Admitting: Emergency Medicine

## 2011-09-07 DIAGNOSIS — I10 Essential (primary) hypertension: Secondary | ICD-10-CM | POA: Insufficient documentation

## 2011-09-07 DIAGNOSIS — R51 Headache: Secondary | ICD-10-CM | POA: Insufficient documentation

## 2011-09-07 DIAGNOSIS — G4700 Insomnia, unspecified: Secondary | ICD-10-CM | POA: Insufficient documentation

## 2011-09-07 DIAGNOSIS — I2510 Atherosclerotic heart disease of native coronary artery without angina pectoris: Secondary | ICD-10-CM | POA: Insufficient documentation

## 2011-09-07 DIAGNOSIS — I252 Old myocardial infarction: Secondary | ICD-10-CM | POA: Insufficient documentation

## 2011-09-07 DIAGNOSIS — M129 Arthropathy, unspecified: Secondary | ICD-10-CM | POA: Insufficient documentation

## 2011-09-07 DIAGNOSIS — M67919 Unspecified disorder of synovium and tendon, unspecified shoulder: Secondary | ICD-10-CM | POA: Insufficient documentation

## 2011-09-07 DIAGNOSIS — E039 Hypothyroidism, unspecified: Secondary | ICD-10-CM | POA: Insufficient documentation

## 2011-09-07 DIAGNOSIS — M719 Bursopathy, unspecified: Secondary | ICD-10-CM | POA: Insufficient documentation

## 2011-09-07 DIAGNOSIS — M25519 Pain in unspecified shoulder: Secondary | ICD-10-CM | POA: Insufficient documentation

## 2011-09-07 NOTE — Telephone Encounter (Signed)
Noted.  Appt on Monday is appropriate.  Thanks.

## 2011-09-09 ENCOUNTER — Ambulatory Visit: Payer: Self-pay | Admitting: Family Medicine

## 2011-09-11 ENCOUNTER — Ambulatory Visit (INDEPENDENT_AMBULATORY_CARE_PROVIDER_SITE_OTHER): Payer: Self-pay | Admitting: Family Medicine

## 2011-09-11 ENCOUNTER — Encounter: Payer: Self-pay | Admitting: Family Medicine

## 2011-09-11 VITALS — BP 126/75 | HR 83 | Temp 98.3°F | Ht 63.6 in | Wt 213.0 lb

## 2011-09-11 DIAGNOSIS — M549 Dorsalgia, unspecified: Secondary | ICD-10-CM

## 2011-09-11 DIAGNOSIS — R251 Tremor, unspecified: Secondary | ICD-10-CM

## 2011-09-11 DIAGNOSIS — Z23 Encounter for immunization: Secondary | ICD-10-CM

## 2011-09-11 DIAGNOSIS — G4700 Insomnia, unspecified: Secondary | ICD-10-CM

## 2011-09-11 DIAGNOSIS — R259 Unspecified abnormal involuntary movements: Secondary | ICD-10-CM

## 2011-09-11 NOTE — Assessment & Plan Note (Signed)
Cannot elicit history of anxiety.  Was pre-existing synthroid replacement.  Will cut down on caffeine and continue to follow.  Will recheck tsh in 4 weeks.

## 2011-09-11 NOTE — Progress Notes (Signed)
  Subjective:    Patient ID: [name], female    DOB: January 19, 1956, 55 y.o.   MRN: [id number]  HPI Last week started having leg shaking, headache, trouble sleeping.  Notes shaking never started before last week, lasts about an hour, usually onset just while setting, no significant stress.  Feels like "insides are shaking" notes hand and legs, but not her head.  Notes one time her legs were shaking and she fell.  Episodes happen daily.  She replicates a mild tremor not any ballistic movements.  Neck, back and leg pain have been going on for a while.  States has never tried any medications, and did not get anything from the ER.  When I questioned her directly, she states she does not like vicodin because it makes her nauseous.  Upon further questioning she does admit to getting tylenol #3 which did help.  Insomnia:  New in the past week.  Trouble initiating sleep, gets enough sleep at night when she can go to sleep. Notes she stays up and watches TV  Until she drifts off to sleep at 2-3 in the morning.  Never goes without sleep.    Notes drinks 3 cups of coffee, las cup at 1-2 in the afternoon, no more coffee than usual.  No alcohol.  No smoking.  Symptoms preceded tx with synthroid.  Has been on synthroid for less than 1 week.   Review of Systemssee HPI     Objective:   Physical Exam GEN: Alert & Oriented, No acute distress CV:  Regular Rate & Rhythm, no murmur Respiratory:  Normal work of breathing, CTAB Abd:  + BS, soft, no tenderness to palpation Neuro:  Reflexes difficult to elicit.  Gait normal without evidence of pain.  Strength 5/5, at times gives way.  Finger to nose, normal.         Assessment & Plan:

## 2011-09-11 NOTE — Assessment & Plan Note (Signed)
Unusual timing of new onset just 1 week ago.  She states it was there prior to starting synthroid.  Discussed sleep hygiene, caffeine restriction.  No evidence of mania, cannot elicit symptoms of anxiety or panic.

## 2011-09-11 NOTE — Patient Instructions (Signed)
Use tylenol (acetaminophen) extra strength several times a day to help muscle aches improve.  If needed, can use ibuprofen added to this.  See handout on sleep hygiene.  Walk at 3 days a week, try walking faster and farther.   Make appointment for 4-6 weeks for thyroid recheck.

## 2011-09-11 NOTE — Assessment & Plan Note (Addendum)
Pain in many locations, back, legs, knees, shoulders.  Pain stated as 10/10, not consistent with exam.  Will start regular acetaminophen with ibuprofen as needed.   Will start walking program- gave her pt education.  Patient agreeable with plan.  I suspect will improve some with thyroid replacement.  Will evaluate further at next appt.

## 2011-09-16 HISTORY — PX: CARDIAC CATHETERIZATION: SHX172

## 2011-09-21 ENCOUNTER — Observation Stay (HOSPITAL_COMMUNITY)
Admission: EM | Admit: 2011-09-21 | Discharge: 2011-09-23 | Disposition: A | Discharge status: Home / Self Care | Payer: Self-pay | Attending: Cardiovascular Disease | Admitting: Cardiovascular Disease

## 2011-09-21 ENCOUNTER — Emergency Department (HOSPITAL_COMMUNITY): Payer: Self-pay

## 2011-09-21 DIAGNOSIS — E785 Hyperlipidemia, unspecified: Secondary | ICD-10-CM | POA: Insufficient documentation

## 2011-09-21 DIAGNOSIS — I10 Essential (primary) hypertension: Secondary | ICD-10-CM | POA: Insufficient documentation

## 2011-09-21 DIAGNOSIS — F411 Generalized anxiety disorder: Secondary | ICD-10-CM | POA: Insufficient documentation

## 2011-09-21 DIAGNOSIS — R079 Chest pain, unspecified: Principal | ICD-10-CM | POA: Insufficient documentation

## 2011-09-21 DIAGNOSIS — I252 Old myocardial infarction: Secondary | ICD-10-CM | POA: Insufficient documentation

## 2011-09-22 ENCOUNTER — Observation Stay (HOSPITAL_COMMUNITY): Payer: Self-pay

## 2011-09-22 ENCOUNTER — Other Ambulatory Visit (HOSPITAL_COMMUNITY): Payer: Self-pay

## 2011-09-22 LAB — DIFFERENTIAL
Basophils Absolute: 0 10*3/uL (ref 0.0–0.1)
Basophils Relative: 1 % (ref 0–1)
Eosinophils Absolute: 0.3 10*3/uL (ref 0.0–0.7)
Eosinophils Relative: 4 % (ref 0–5)
Monocytes Absolute: 0.5 10*3/uL (ref 0.1–1.0)
Monocytes Relative: 7 % (ref 3–12)
Neutro Abs: 3.5 10*3/uL (ref 1.7–7.7)

## 2011-09-22 LAB — CBC
Hemoglobin: 12 g/dL (ref 12.0–15.0)
MCH: 27.1 pg (ref 26.0–34.0)
MCHC: 33 g/dL (ref 30.0–36.0)
Platelets: 253 10*3/uL (ref 150–400)
RDW: 14.6 % (ref 11.5–15.5)

## 2011-09-22 LAB — TSH: TSH: 4.193 u[IU]/mL (ref 0.350–4.500)

## 2011-09-22 LAB — LIPID PANEL
HDL: 46 mg/dL (ref >39)
Total CHOL/HDL Ratio: 4.7 RATIO
VLDL: 26 mg/dL (ref 0–40)

## 2011-09-22 LAB — POCT I-STAT, CHEM 8
HCT: 39 % (ref 36.0–46.0)
Hemoglobin: 13.3 g/dL (ref 12.0–15.0)
Potassium: 3.8 mEq/L (ref 3.5–5.1)
Sodium: 141 mEq/L (ref 135–145)
TCO2: 23 mmol/L (ref 0–100)

## 2011-09-22 LAB — CARDIAC PANEL(CRET KIN+CKTOT+MB+TROPI)
CK, MB: 1.6 ng/mL (ref 0.3–4.0)
Relative Index: INVALID (ref 0.0–2.5)
Total CK: 51 U/L (ref 7–177)
Total CK: 50 U/L (ref 7–177)

## 2011-09-22 LAB — POCT I-STAT TROPONIN I: Troponin i, poc: 0 ng/mL (ref 0.00–0.08)

## 2011-09-22 LAB — HEPARIN LEVEL (UNFRACTIONATED)
Heparin Unfractionated: <0.1 IU/mL — ABNORMAL LOW (ref 0.30–0.70)
Heparin Unfractionated: 0.33 IU/mL (ref 0.30–0.70)

## 2011-09-22 LAB — APTT: aPTT: 34 seconds (ref 24–37)

## 2011-09-22 LAB — CK TOTAL AND CKMB (NOT AT ARMC): Total CK: 60 U/L (ref 7–177)

## 2011-09-22 MED ORDER — TECHNETIUM TC 99M TETROFOSMIN IV KIT
30.0000 | PACK | Freq: Once | INTRAVENOUS | Status: AC | PRN
  Administered 2011-09-22: 30 via INTRAVENOUS

## 2011-09-22 MED ORDER — TECHNETIUM TC 99M TETROFOSMIN IV KIT
10.0000 | PACK | Freq: Once | INTRAVENOUS | Status: AC | PRN
  Administered 2011-09-22: 10 via INTRAVENOUS

## 2011-09-24 ENCOUNTER — Emergency Department (HOSPITAL_COMMUNITY): Payer: Self-pay

## 2011-09-24 ENCOUNTER — Emergency Department (HOSPITAL_COMMUNITY)
Admission: EM | Admit: 2011-09-24 | Discharge: 2011-09-24 | Disposition: A | Discharge status: Home / Self Care | Payer: Self-pay | Attending: Emergency Medicine | Admitting: Emergency Medicine

## 2011-09-24 DIAGNOSIS — M545 Low back pain, unspecified: Secondary | ICD-10-CM | POA: Insufficient documentation

## 2011-09-24 DIAGNOSIS — I2510 Atherosclerotic heart disease of native coronary artery without angina pectoris: Secondary | ICD-10-CM | POA: Insufficient documentation

## 2011-09-24 DIAGNOSIS — E039 Hypothyroidism, unspecified: Secondary | ICD-10-CM | POA: Insufficient documentation

## 2011-09-24 DIAGNOSIS — I252 Old myocardial infarction: Secondary | ICD-10-CM | POA: Insufficient documentation

## 2011-09-24 DIAGNOSIS — I10 Essential (primary) hypertension: Secondary | ICD-10-CM | POA: Insufficient documentation

## 2011-09-24 DIAGNOSIS — M25519 Pain in unspecified shoulder: Secondary | ICD-10-CM | POA: Insufficient documentation

## 2011-09-24 DIAGNOSIS — Y92009 Unspecified place in unspecified non-institutional (private) residence as the place of occurrence of the external cause: Secondary | ICD-10-CM | POA: Insufficient documentation

## 2011-09-24 DIAGNOSIS — W19XXXA Unspecified fall, initial encounter: Secondary | ICD-10-CM | POA: Insufficient documentation

## 2011-09-24 DIAGNOSIS — R079 Chest pain, unspecified: Secondary | ICD-10-CM | POA: Insufficient documentation

## 2011-09-24 DIAGNOSIS — M549 Dorsalgia, unspecified: Secondary | ICD-10-CM | POA: Insufficient documentation

## 2011-09-25 NOTE — Discharge Summary (Signed)
  NAMEKIRBI, FARRUGIA NO.:  [id number]  MEDICAL RECORD NO.:  [id number]  LOCATION:  3707                         FACILITY:  [facility]  PHYSICIAN:  Ricki Rodriguez, M.D.  DATE OF BIRTH:  Feb 05, 1956  DATE OF ADMISSION:  09/21/2011 DATE OF DISCHARGE:  09/23/2011                              DISCHARGE SUMMARY   HOSPITAL LOCATION:  3707, bed #2.  FINAL DIAGNOSES: 1. Chest pain. 2. Anxiety. 3. Hyperlipidemia. 4. Old myocardial infarction.  DISCHARGE MEDICATIONS: 1. Xanax 0.25 mg 1 daily. 2. Nitroglycerin 0.4 mg 1 sublingual q.5 minutes x3 as needed for     chest pain. 3. Pravastatin 40 mg 1 in the evening. 4. Levothyroxine 125 mcg daily. 5. Albuterol inhaler 90 mcg 1-2 puffs 4 times daily as needed. 6. Aspirin 81 mg daily. 7. Benzonatate 100 mg 1 twice daily as needed for cough. 8. Metoprolol tartrate 25 mg 1 twice daily.  DISCHARGE DIET:  Low-sodium, heart-healthy diet.  DISCHARGE ACTIVITY:  The patient is to increase activity gradually as tolerated.  CONDITION ON DISCHARGE:  Improved.  FOLLOWUP:  By Dr. Orpah Cobb in 2 weeks.  HISTORY:  This 55 year old white female presented with recurrent left- sided chest pain radiating to back associated with shortness of breath and nausea.  No fever or cough.  PHYSICAL EXAMINATION:  VITAL SIGNS:  Temperature 98, pulse 73, respirations 16, blood pressure 120/60, height 5 feet and 8 inches, and weight approximately 216 pounds. GENERAL:  The patient is averagely built and well-nourished female, in no acute distress. HEENT:  The patient is normocephalic and atraumatic with brown eyes. Conjunctivae are pink.  Sclerae are nonicteric. NECK:  No JVD. LUNGS:  Clear bilaterally. HEART:  Normal S1, S2. ABDOMEN:  Soft. EXTREMITIES:  No cyanosis or clubbing.  Trace edema present. SKIN:  Warm and dry. NEUROLOGICAL:  The patient moves all 4 extremities.  Cranial nerves grossly intact.  LABORATORY DATA:  Normal  hemoglobin, hematocrit, WBC count, platelet count.  Normal electrolytes, BUN, creatinine.  Normal CK-MB and troponin I x3. EKG:  Normal sinus rhythm with anterior ischemia. Chest x-ray:  No acute process. Nuclear stress test with a normal LV systolic function and no reversible ischemia.  HOSPITAL COURSE:  The patient was admitted to telemetry unit. Myocardial infarction was ruled out.  She underwent nuclear stress test that failed to show any reversible ischemia.  Her medications were adjusted.  She was started on pravastatin for hyperlipidemia and her Levothroid dose was increased to 125 mcg for a borderline high TSH of 4.193.  She will be followed by me in 2 weeks.     Ricki Rodriguez, M.D.     ASK/MEDQ  D:  09/23/2011  T:  09/23/2011  Job:  [id number]  Electronically Signed by Orpah Cobb M.D. on 09/25/2011 05:19:25 PM

## 2011-09-30 ENCOUNTER — Inpatient Hospital Stay (HOSPITAL_COMMUNITY)
Admission: EM | Admit: 2011-09-30 | Discharge: 2011-10-01 | DRG: 287 | Disposition: A | Discharge status: Home / Self Care | Payer: Self-pay | Source: Ambulatory Visit | Attending: Cardiovascular Disease | Admitting: Cardiovascular Disease

## 2011-09-30 ENCOUNTER — Emergency Department (HOSPITAL_COMMUNITY): Payer: Self-pay

## 2011-09-30 DIAGNOSIS — J45909 Unspecified asthma, uncomplicated: Secondary | ICD-10-CM | POA: Diagnosis present

## 2011-09-30 DIAGNOSIS — R0789 Other chest pain: Principal | ICD-10-CM | POA: Diagnosis present

## 2011-09-30 DIAGNOSIS — F411 Generalized anxiety disorder: Secondary | ICD-10-CM | POA: Diagnosis present

## 2011-09-30 DIAGNOSIS — N390 Urinary tract infection, site not specified: Secondary | ICD-10-CM | POA: Diagnosis present

## 2011-09-30 DIAGNOSIS — Z7982 Long term (current) use of aspirin: Secondary | ICD-10-CM

## 2011-09-30 DIAGNOSIS — I252 Old myocardial infarction: Secondary | ICD-10-CM

## 2011-09-30 LAB — POCT I-STAT, CHEM 8
BUN: 25 mg/dL — ABNORMAL HIGH (ref 6–23)
Calcium, Ion: 1.2 mmol/L (ref 1.12–1.32)
Chloride: 106 mEq/L (ref 96–112)
Creatinine, Ser: 0.8 mg/dL (ref 0.50–1.10)
Glucose, Bld: 97 mg/dL (ref 70–99)
HCT: 39 % (ref 36.0–46.0)
Hemoglobin: 13.3 g/dL (ref 12.0–15.0)
Potassium: 4.2 mEq/L (ref 3.5–5.1)
Sodium: 140 meq/L (ref 135–145)
TCO2: 23 mmol/L (ref 0–100)

## 2011-09-30 LAB — DIFFERENTIAL
Eosinophils Absolute: 0.3 10*3/uL (ref 0.0–0.7)
Lymphocytes Relative: 34 % (ref 12–46)
Lymphs Abs: 2.5 10*3/uL (ref 0.7–4.0)
Monocytes Relative: 10 % (ref 3–12)
Neutrophils Relative %: 52 % (ref 43–77)

## 2011-09-30 LAB — POCT I-STAT TROPONIN I: Troponin i, poc: 0 ng/mL (ref 0.00–0.08)

## 2011-09-30 LAB — CBC
HCT: 37.9 % (ref 36.0–46.0)
MCH: 28.1 pg (ref 26.0–34.0)
MCV: 83.3 fL (ref 78.0–100.0)
Platelets: 263 10*3/uL (ref 150–400)
RBC: 4.55 MIL/uL (ref 3.87–5.11)
WBC: 7.3 10*3/uL (ref 4.0–10.5)

## 2011-09-30 LAB — D-DIMER, QUANTITATIVE: D-Dimer, Quant: 0.51 ug/mL-FEU — ABNORMAL HIGH (ref 0.00–0.48)

## 2011-10-01 LAB — URINALYSIS, MICROSCOPIC ONLY
Bilirubin Urine: NEGATIVE
Glucose, UA: NEGATIVE mg/dL
Nitrite: NEGATIVE
Specific Gravity, Urine: 1.013 (ref 1.005–1.030)
pH: 6 (ref 5.0–8.0)

## 2011-10-01 LAB — DIFFERENTIAL
Lymphocytes Relative: 41 % (ref 12–46)
Lymphs Abs: 2.9 10*3/uL (ref 0.7–4.0)
Monocytes Relative: 9 % (ref 3–12)
Neutro Abs: 3.2 10*3/uL (ref 1.7–7.7)
Neutrophils Relative %: 46 % (ref 43–77)

## 2011-10-01 LAB — CARDIAC PANEL(CRET KIN+CKTOT+MB+TROPI)
Relative Index: INVALID (ref 0.0–2.5)
Total CK: 44 U/L (ref 7–177)
Troponin I: <0.3 ng/mL (ref <0.30)

## 2011-10-01 LAB — CBC
HCT: 37.8 % (ref 36.0–46.0)
Hemoglobin: 12.1 g/dL (ref 12.0–15.0)
MCH: 26.9 pg (ref 26.0–34.0)
MCV: 84 fL (ref 78.0–100.0)
Platelets: 254 10*3/uL (ref 150–400)
RBC: 4.5 MIL/uL (ref 3.87–5.11)
WBC: 7.1 10*3/uL (ref 4.0–10.5)

## 2011-10-01 LAB — BASIC METABOLIC PANEL
CO2: 27 mEq/L (ref 19–32)
Chloride: 104 mEq/L (ref 96–112)
Creatinine, Ser: 0.66 mg/dL (ref 0.50–1.10)
GFR calc Af Amer: >90 mL/min (ref >90)
Sodium: 139 mEq/L (ref 135–145)

## 2011-10-01 LAB — POCT ACTIVATED CLOTTING TIME: Activated Clotting Time: 89 seconds

## 2011-10-01 LAB — PROTIME-INR
INR: 1.05 (ref 0.00–1.49)
Prothrombin Time: 13.9 seconds (ref 11.6–15.2)

## 2011-10-04 NOTE — Discharge Summary (Signed)
  [name], [name] NO.:  [id number]  MEDICAL RECORD NO.:  [id number]  LOCATION:  3731                         FACILITY:  [facility]  PHYSICIAN:  Ricki Rodriguez, M.D.  DATE OF BIRTH:  10-04-1956  DATE OF ADMISSION:  09/30/2011 DATE OF DISCHARGE:  10/01/2011                              DISCHARGE SUMMARY   FINAL DIAGNOSES: 1. Chest pain. 2. Possible urinary tract infection. 3. Anxiety. 4. Possible asthma.  DISCHARGE MEDICATIONS: 1. Ciprofloxacin 250 mg 1 twice daily for 5 days. 2. Albuterol inhaler 1-2 puffs 4 times daily as needed. 3. Xanax 0.25 mg 1 daily as needed. 4. Aspirin 81 mg daily. 5. Benzonatate 100 mg 1 twice daily as needed. 6. Levothroid 125 mcg 1 daily. 7. Metoprolol tartrate 25 mg 1 twice daily. 8. Nitroglycerin sublingual 0.4 mg 1 under the tongue every 5 minutes     x 3 as needed for chest pain. 9. Pravachol 40 mg 1 at bedtime.  DISCHARGE DIET:  Low-sodium heart-healthy diet.  DISCHARGE ACTIVITY:  The patient to increase activity gradually as tolerated.  The patient to refrain from any lifting, pulling, pushing, or sexual activity for 2 days.  CONDITION ON DISCHARGE:  Stable.  HISTORY:  This is a 55 year old white female who presented with recurrent left-sided chest pain along with some shortness of breath, nausea, and radiation of the pain to the back.  The patient had a nuclear stress test recently that was negative for reversible ischemia.  PHYSICAL EXAMINATION:  VITAL SIGNS:  Temperature 98, pulse 67, respirations 20, blood pressure 132/63. GENERAL:  The patient is averagely built and well-nourished white female in mild distress. HEENT:  The patient is normocephalic atraumatic with brown eyes. Conjunctivae pink.  Sclerae nonicteric. NECK:  No JVD. LUNGS:  Clear bilaterally. HEART:  Normal S1,S2. ABDOMEN:  Soft and full. EXTREMITIES:  No cyanosis or clubbing.  Trace edema. SKIN:  Warm and dry. NEUROLOGICALLY:  The patient  moves all 4 extremities.  LABORATORY DATA:  Normal hemoglobin, hematocrit, WBC count.  Normal electrolytes.  BUN borderline 25, creatinine 0.80, glucose 97.  D-dimer borderline at 0.51.  CK-MB troponin-I negative x3.  INR 1.05. Urinalysis showed small leukocytosis with 3-6 WBCs on high-power field, and rare bacteria.  EKG showed normal sinus rhythm with nonspecific T- wave changes.  Cardiac catheterization showed normal coronaries and preserved LV systolic function.  HOSPITAL COURSE:  The patient was admitted to telemetry unit, myocardial infarction was ruled out.  Because of recurrent nature of her typical anginal chest pain, the patient underwent cardiac catheterization that failed to show any coronary artery disease.  The patient then reported possible bladder infection.  Urinalysis was mostly negative with 3-6 WBCs on high-power field.  She was given a 5-day course of Cipro 250 mg b.i.d. and she was discharged home in satisfactory condition with followup by me in 2 weeks.     Ricki Rodriguez, M.D.     ASK/MEDQ  D:  10/01/2011  T:  10/02/2011  Job:  [id number]  Electronically Signed by Orpah Cobb M.D. on 10/04/2011 10:01:45 AM

## 2011-10-15 ENCOUNTER — Ambulatory Visit (INDEPENDENT_AMBULATORY_CARE_PROVIDER_SITE_OTHER): Payer: Self-pay | Admitting: Family Medicine

## 2011-10-15 ENCOUNTER — Encounter: Payer: Self-pay | Admitting: Family Medicine

## 2011-10-15 DIAGNOSIS — E039 Hypothyroidism, unspecified: Secondary | ICD-10-CM

## 2011-10-15 DIAGNOSIS — R0609 Other forms of dyspnea: Secondary | ICD-10-CM

## 2011-10-15 DIAGNOSIS — R079 Chest pain, unspecified: Secondary | ICD-10-CM | POA: Insufficient documentation

## 2011-10-15 DIAGNOSIS — F411 Generalized anxiety disorder: Secondary | ICD-10-CM

## 2011-10-15 DIAGNOSIS — F419 Anxiety disorder, unspecified: Secondary | ICD-10-CM | POA: Insufficient documentation

## 2011-10-15 DIAGNOSIS — R0600 Dyspnea, unspecified: Secondary | ICD-10-CM

## 2011-10-15 MED ORDER — PAROXETINE HCL 20 MG PO TABS: 20.0000 mg | ORAL_TABLET | Freq: Every day | ORAL | Status: DC

## 2011-10-15 NOTE — Patient Instructions (Signed)
Will start new medicine paroxetine to help prevent anxiety  Make appointment for pulmonary function testing  Make follow-up with me in 3-4 weeks after PFT's

## 2011-10-15 NOTE — Progress Notes (Signed)
  Subjective:    Patient ID: [name], female    DOB: 08/22/1956, 55 y.o.   MRN: [id number]  HPI patient presents today for hospital followup. She was admitted on October 15 through October 16 for recurrent chest pain the   Chest pain: During this hospitalization patient had a normal cardiac catheterization with preserved LV systolic function. She has a followup scheduled with Dr. Precious Gilding. She was started on pravastatin and was continued on her aspirin, metoprolol. She was also given a prescription for nitroglycerin which she has not filled.  She continues to have episodic recurrent chest pain and dyspnea which she continues to feel very anxious about. She was given a prescription for Xanax which she feels helps him  Dyspnea: Patient reports that she has had chronic dyspnea for several years since she had her history of patient reported MI.  She has smoked a pack per day since the age of 35 and recently quit last year. She notes frequent coughing with occasional sputum.  No fevers or significant change, pnd,  she reports albuterol helps her when she feels dyspneic  Anxiety: She reports that part of the reason she has multiple ER visits is due to the fact that she becomes very anxious when she has periods of dyspnea. She is able to say that the Xanax she was prescribed this for anxiety. When asked rectally she states she has no stress, worries, or sadness.   Review of Systems Please see history of present illness    Objective:   Physical Exam GEN: Alert & Oriented, No acute distress CV:  Regular Rate & Rhythm, no murmur Respiratory:  Normal work of breathing, CTAB Abd:  + BS, soft, no tenderness to palpation         Assessment & Plan:

## 2011-10-15 NOTE — Assessment & Plan Note (Addendum)
Patient's TSH was checked in the hospital on October 7 and was normal at 4.19.  I have encouraged her to continue taking her Synthroid regularly

## 2011-10-15 NOTE — Assessment & Plan Note (Signed)
I suspect that a large part of her chest pain is do to anxiety. She received Xanax while in the hospital and on discharge. I have advised her that I would not continue that for long-term use but will start paroxetine today

## 2011-10-15 NOTE — Assessment & Plan Note (Signed)
I suspect that a large part of her dyspnea is anxiety related. They also have a component of deconditioning and it is certainly possible she has COPD from her long history of tobacco use but she has quit in the past year.  Will send her for PFTs to further characterize her lung function.

## 2011-10-15 NOTE — Assessment & Plan Note (Signed)
Patient is a poor historian and and it is unclear to me if that time she has not been forthcoming with the medical care and prescription she has picked up or if she is having difficulty understanding what she has been told about her health.  I suspect anxiety does play a role in her multiple ER visits even though she is not able to characterize this in words for me today.  Will start proximal team 20 mg and titrate up to 40 mg. I will followup with her in 3-4 weeks

## 2011-10-20 ENCOUNTER — Encounter (HOSPITAL_COMMUNITY): Payer: Self-pay | Admitting: *Deleted

## 2011-10-20 ENCOUNTER — Other Ambulatory Visit: Payer: Self-pay

## 2011-10-20 DIAGNOSIS — R0602 Shortness of breath: Secondary | ICD-10-CM | POA: Insufficient documentation

## 2011-10-20 NOTE — ED Notes (Signed)
She is c/o sob and heart fluttering  Since yesterday.

## 2011-10-21 ENCOUNTER — Emergency Department (HOSPITAL_COMMUNITY)
Admission: EM | Admit: 2011-10-21 | Discharge: 2011-10-21 | Disposition: A | Discharge status: Home / Self Care | Payer: Self-pay | Attending: Emergency Medicine | Admitting: Emergency Medicine

## 2011-10-21 HISTORY — DX: Unspecified osteoarthritis, unspecified site: M19.90

## 2011-10-21 HISTORY — DX: Essential (primary) hypertension: I10

## 2011-10-25 ENCOUNTER — Ambulatory Visit: Payer: Self-pay | Admitting: Pharmacist

## 2011-10-27 ENCOUNTER — Emergency Department (HOSPITAL_COMMUNITY)
Admission: EM | Admit: 2011-10-27 | Discharge: 2011-10-28 | Disposition: A | Discharge status: Home / Self Care | Payer: Self-pay | Attending: Emergency Medicine | Admitting: Emergency Medicine

## 2011-10-27 DIAGNOSIS — M545 Low back pain, unspecified: Secondary | ICD-10-CM | POA: Insufficient documentation

## 2011-10-27 DIAGNOSIS — M7989 Other specified soft tissue disorders: Secondary | ICD-10-CM | POA: Insufficient documentation

## 2011-10-27 DIAGNOSIS — R109 Unspecified abdominal pain: Secondary | ICD-10-CM | POA: Insufficient documentation

## 2011-10-27 DIAGNOSIS — M549 Dorsalgia, unspecified: Secondary | ICD-10-CM

## 2011-10-27 DIAGNOSIS — R609 Edema, unspecified: Secondary | ICD-10-CM | POA: Insufficient documentation

## 2011-10-27 DIAGNOSIS — Z79899 Other long term (current) drug therapy: Secondary | ICD-10-CM | POA: Insufficient documentation

## 2011-10-27 DIAGNOSIS — I10 Essential (primary) hypertension: Secondary | ICD-10-CM | POA: Insufficient documentation

## 2011-10-27 DIAGNOSIS — M25476 Effusion, unspecified foot: Secondary | ICD-10-CM | POA: Insufficient documentation

## 2011-10-27 DIAGNOSIS — M25473 Effusion, unspecified ankle: Secondary | ICD-10-CM | POA: Insufficient documentation

## 2011-10-27 DIAGNOSIS — I2510 Atherosclerotic heart disease of native coronary artery without angina pectoris: Secondary | ICD-10-CM | POA: Insufficient documentation

## 2011-10-27 DIAGNOSIS — M129 Arthropathy, unspecified: Secondary | ICD-10-CM | POA: Insufficient documentation

## 2011-10-27 DIAGNOSIS — I252 Old myocardial infarction: Secondary | ICD-10-CM | POA: Insufficient documentation

## 2011-10-28 ENCOUNTER — Encounter (HOSPITAL_COMMUNITY): Payer: Self-pay | Admitting: *Deleted

## 2011-10-28 LAB — COMPREHENSIVE METABOLIC PANEL
Albumin: 3.9 g/dL (ref 3.5–5.2)
Alkaline Phosphatase: 84 U/L (ref 39–117)
BUN: 15 mg/dL (ref 6–23)
CO2: 25 mEq/L (ref 19–32)
Chloride: 101 mEq/L (ref 96–112)
Glucose, Bld: 96 mg/dL (ref 70–99)
Potassium: 3.9 mEq/L (ref 3.5–5.1)
Total Bilirubin: 0.3 mg/dL (ref 0.3–1.2)

## 2011-10-28 LAB — URINALYSIS, ROUTINE W REFLEX MICROSCOPIC
Glucose, UA: NEGATIVE mg/dL
Leukocytes, UA: NEGATIVE
Nitrite: NEGATIVE
Specific Gravity, Urine: 1.014 (ref 1.005–1.030)
pH: 6.5 (ref 5.0–8.0)

## 2011-10-28 LAB — CBC
Hemoglobin: 12.5 g/dL (ref 12.0–15.0)
RBC: 4.57 MIL/uL (ref 3.87–5.11)
WBC: 7.2 10*3/uL (ref 4.0–10.5)

## 2011-10-28 LAB — DIFFERENTIAL
Basophils Relative: 1 % (ref 0–1)
Lymphocytes Relative: 39 % (ref 12–46)
Lymphs Abs: 2.8 10*3/uL (ref 0.7–4.0)
Monocytes Relative: 10 % (ref 3–12)
Neutro Abs: 3.3 10*3/uL (ref 1.7–7.7)
Neutrophils Relative %: 46 % (ref 43–77)

## 2011-10-28 LAB — LIPASE, BLOOD: Lipase: 57 U/L (ref 11–59)

## 2011-10-28 MED ORDER — ONDANSETRON HCL 4 MG/2ML IJ SOLN
4.0000 mg | Freq: Once | INTRAMUSCULAR | Status: AC
  Administered 2011-10-28: 4 mg via INTRAVENOUS
  Filled 2011-10-28: qty 2

## 2011-10-28 MED ORDER — HYDROCODONE-ACETAMINOPHEN 5-325 MG PO TABS: 2.0000 | ORAL_TABLET | ORAL | Status: AC | PRN

## 2011-10-28 MED ORDER — MORPHINE SULFATE 4 MG/ML IJ SOLN
4.0000 mg | Freq: Once | INTRAMUSCULAR | Status: AC
  Administered 2011-10-28: 4 mg via INTRAVENOUS
  Filled 2011-10-28: qty 1

## 2011-10-28 NOTE — ED Notes (Signed)
Pt c/o lower right back pain; no known injury; also c/o lower legs swelling x 2 days

## 2011-10-28 NOTE — ED Notes (Signed)
Pt states that she commonly has pain in her legs and arms but that recently, over the past few weeks, her legs have been swelling more than normal and that her back pain has increased.

## 2011-10-28 NOTE — ED Notes (Signed)
Pt reports that she has had bilat lower extremity swelling starting several weeks ago, becoming worse.  She reports that she has noticed that she gets short of breath with minimal activity and tonight she began having sharp pains in her low R back.  Pt denies history of ankle edema.

## 2011-10-28 NOTE — ED Provider Notes (Signed)
History     CSN: [id number] Arrival date & time: 10/27/2011 11:12 PM   First MD Initiated Contact with Patient 10/28/11 0131      Chief Complaint  Patient presents with  . Back Pain   HPI History provided by the patient. Patient presents with complaints of right lower back pains for the past few days. Patient also states that she has noticed some swelling to bilateral feet around ankles. Pain is increased with some movements and position. She has also noticed pain increases when eating. She has no change in appetite. Patient denies any injury or trauma. No dysuria, hematuria, urinary frequency. Patient denies fever, chills, sweats, nausea or vomiting. Patient denies history of kidney stone. No numbness or tingling of lower extremities, no incontinence, no weakness in legs.     Past Medical History  Diagnosis Date  . Myocardial infarction 1997  . Coronary artery disease   . Hypertension   . Arthritis     Past Surgical History  Procedure Date  . Ectopic pregnancy surgery     right    Family History  Problem Relation Age of Onset  . Cancer Mother 49    lung ca  . Diabetes Mother   . Anxiety disorder Sister   . Cancer Brother 60    lung    History  Substance Use Topics  . Smoking status: Former Smoker -- 1.0 packs/day for 3 years    Quit date: 12/03/2010  . Smokeless tobacco: Not on file  . Alcohol Use: No    OB History    Grav Para Term Preterm Abortions TAB SAB Ect Mult Living                  Review of Systems  Constitutional: Negative for fever and chills.  Respiratory: Negative for shortness of breath.   Cardiovascular: Negative for chest pain.  Gastrointestinal: Negative for nausea, vomiting, abdominal pain, diarrhea and constipation.  Genitourinary: Positive for flank pain. Negative for dysuria, frequency and hematuria.  Musculoskeletal: Positive for back pain.  All other systems reviewed and are negative.    Allergies  Penicillins  Home  Medications   Current Outpatient Rx  Name Route Sig Dispense Refill  . ALBUTEROL SULFATE HFA 108 (90 BASE) MCG/ACT IN AERS Inhalation Inhale 2 puffs into the lungs every 6 (six) hours as needed.      . ASPIRIN EC 81 MG PO TBEC Oral Take 1 tablet (81 mg total) by mouth daily.  2  . LEVOTHYROXINE SODIUM 112 MCG PO TABS Oral Take 1 tablet (112 mcg total) by mouth daily. Dispense generic 30 tablet 1  . METOPROLOL TARTRATE 25 MG PO TABS Oral Take 1 tablet (25 mg total) by mouth 2 (two) times daily. 60 tablet 11  . PAROXETINE HCL 20 MG PO TABS Oral Take 1-2 tablets (20-40 mg total) by mouth daily. For 1 week, then 1.5 tablets daily x 1 week, then two tablets daily 60 tablet 0  . PRAVASTATIN SODIUM 40 MG PO TABS Oral Take 40 mg by mouth daily.        BP 113/67  Pulse 78  Temp(Src) 97.9 F (36.6 C) (Oral)  Resp 20  Ht 5\' 6"  (1.676 m)  Wt 212 lb (96.163 kg)  BMI 34.22 kg/m2  SpO2 99%  Physical Exam  Nursing note and vitals reviewed. Constitutional: She is oriented to person, place, and time. She appears well-developed and well-nourished.  Cardiovascular: Normal rate.   No murmur heard. Pulmonary/Chest: Effort normal  and breath sounds normal. She has no wheezes. She has no rales.  Abdominal: Soft. Bowel sounds are normal. She exhibits no distension and no mass. There is no tenderness. There is no rebound and no guarding.  Musculoskeletal: Normal range of motion. She exhibits no tenderness.       Mild bilateral lower extremity edema without pitting. Normal DP pulses normal sensations and feet.  Neurological: She is alert and oriented to person, place, and time.  Skin: Skin is warm. No erythema.  Psychiatric: She has a normal mood and affect.    ED Course  Procedures (including critical care time)  Labs Reviewed  COMPREHENSIVE METABOLIC PANEL - Abnormal; Notable for the following:    Sodium 134 (*)    All other components within normal limits  URINALYSIS, ROUTINE W REFLEX MICROSCOPIC    CBC  DIFFERENTIAL  LIPASE, BLOOD   Results for orders placed during the hospital encounter of 10/27/11  URINALYSIS, ROUTINE W REFLEX MICROSCOPIC      Component Value Range   Color, Urine YELLOW  YELLOW    Appearance CLEAR  CLEAR    Specific Gravity, Urine 1.014  1.005 - 1.030    pH 6.5  5.0 - 8.0    Glucose, UA NEGATIVE  NEGATIVE (mg/dL)   Hgb urine dipstick NEGATIVE  NEGATIVE    Bilirubin Urine NEGATIVE  NEGATIVE    Ketones, ur NEGATIVE  NEGATIVE (mg/dL)   Protein, ur NEGATIVE  NEGATIVE (mg/dL)   Urobilinogen, UA 0.2  0.0 - 1.0 (mg/dL)   Nitrite NEGATIVE  NEGATIVE    Leukocytes, UA NEGATIVE  NEGATIVE   CBC      Component Value Range   WBC 7.2  4.0 - 10.5 (K/uL)   RBC 4.57  3.87 - 5.11 (MIL/uL)   Hemoglobin 12.5  12.0 - 15.0 (g/dL)   HCT 45.4  09.8 - 11.9 (%)   MCV 83.6  78.0 - 100.0 (fL)   MCH 27.4  26.0 - 34.0 (pg)   MCHC 32.7  30.0 - 36.0 (g/dL)   RDW 14.7  82.9 - 56.2 (%)   Platelets 275  150 - 400 (K/uL)  DIFFERENTIAL      Component Value Range   Neutrophils Relative 46  43 - 77 (%)   Neutro Abs 3.3  1.7 - 7.7 (K/uL)   Lymphocytes Relative 39  12 - 46 (%)   Lymphs Abs 2.8  0.7 - 4.0 (K/uL)   Monocytes Relative 10  3 - 12 (%)   Monocytes Absolute 0.7  0.1 - 1.0 (K/uL)   Eosinophils Relative 4  0 - 5 (%)   Eosinophils Absolute 0.3  0.0 - 0.7 (K/uL)   Basophils Relative 1  0 - 1 (%)   Basophils Absolute 0.1  0.0 - 0.1 (K/uL)  COMPREHENSIVE METABOLIC PANEL      Component Value Range   Sodium 134 (*) 135 - 145 (mEq/L)   Potassium 3.9  3.5 - 5.1 (mEq/L)   Chloride 101  96 - 112 (mEq/L)   CO2 25  19 - 32 (mEq/L)   Glucose, Bld 96  70 - 99 (mg/dL)   BUN 15  6 - 23 (mg/dL)   Creatinine, Ser 1.30  0.50 - 1.10 (mg/dL)   Calcium 9.7  8.4 - 86.5 (mg/dL)   Total Protein 7.2  6.0 - 8.3 (g/dL)   Albumin 3.9  3.5 - 5.2 (g/dL)   AST 20  0 - 37 (U/L)   ALT 18  0 - 35 (U/L)  Alkaline Phosphatase 84  39 - 117 (U/L)   Total Bilirubin 0.3  0.3 - 1.2 (mg/dL)   GFR calc non  Af Amer >90  >90 (mL/min)   GFR calc Af Amer >90  >90 (mL/min)  LIPASE, BLOOD      Component Value Range   Lipase 57  11 - 59 (U/L)     1. Back pain       MDM  Patient seen and evaluated. Patient in no acute distress. Patient complains of right lower back and flank pains. Patient also noticing some swelling in bilateral lower legs over the past 2 days.  Patient feeling better now. Patient's labs and tests unremarkable.      Angus Seller, PA 10/29/11 [phone]

## 2011-10-30 NOTE — ED Provider Notes (Signed)
Medical screening examination/treatment/procedure(s) were performed by non-physician practitioner and as supervising physician I was immediately available for consultation/collaboration.  Flint Melter, MD 10/30/11 [phone]

## 2011-11-04 ENCOUNTER — Ambulatory Visit: Payer: Self-pay | Admitting: Pharmacist

## 2011-11-11 ENCOUNTER — Ambulatory Visit (INDEPENDENT_AMBULATORY_CARE_PROVIDER_SITE_OTHER): Payer: Self-pay | Admitting: Pharmacist

## 2011-11-11 ENCOUNTER — Encounter: Payer: Self-pay | Admitting: Pharmacist

## 2011-11-11 VITALS — BP 144/72 | HR 65 | Ht 64.5 in | Wt 217.2 lb

## 2011-11-11 DIAGNOSIS — R0609 Other forms of dyspnea: Secondary | ICD-10-CM

## 2011-11-11 DIAGNOSIS — R0600 Dyspnea, unspecified: Secondary | ICD-10-CM

## 2011-11-11 NOTE — Assessment & Plan Note (Signed)
Spirometry evaluation with Pre-Bronchodilator reveals near normal lung function.  Patient has been experiencing chronic dyspnea for several years and taking albuterol (on average of two times per day). Continue current treatment plan at this time.  Counseled on continuing to exercise and continuing to avoid smoking. Reviewed results of pulmonary function tests.  Pt verbalized understanding of results.   Written pt instructions provided.   Total time in face to face counseling 30 minutes.  Patient seen with Marijo Conception, PharmD Candidate and Maudry Mayhew, Pharmacy Resident.

## 2011-11-11 NOTE — Progress Notes (Signed)
  Subjective:    Patient ID: [name], female    DOB: July 23, 1956, 55 y.o.   MRN: [id number]  HPI Patient arrives in good spirits for PFT. Reports quitting smoking last year; "had to quit because of bronchitis." States 10 years of smoking (started in her 2's), about 1 ppd. Family members around her (sister and niece) who are still smoking.   Patient with c/o back pain and knee swelling. States heart doctor says she may have arthritis.   Upon medication review, pt states taking albuterol when short of breath about two times per day. Currently taking 1.5 tab of 20mg  of paroxetine on a titration to goal of 40mg  daily. Took last dose of metoprolol (25mg ) this AM; now currently out of metoprolol but with refills. States taking once daily, prescription written for Lopressor 25mg  BID. Has been taking for a couple of months; feels tired. Upon questioning, states no acetaminophen/ibuprofen/MVI use.    Review of Systems     Objective:   Physical Exam  BP 144/72  Pulse 65  Ht 5' 4.5" (1.638 m)  Wt 217 lb 3.2 oz (98.521 kg)  BMI 36.71 kg/m2  SpO2 98% (manual BP reading)  Best FEV1 of 73% Please see Doc flowsheet for Spirometry report.      Assessment & Plan:  Spirometry evaluation with Pre-Bronchodilator reveals near normal lung function.  Patient has been experiencing chronic dyspnea for several years and taking albuterol (on average of two times per day). Continue current treatment plan at this time.  Counseled on continuing to exercise and continuing to avoid smoking. Reviewed results of pulmonary function tests.  Pt verbalized understanding of results.    Recommended pt to try acetaminophen for back pain (up to 3g daily = 6 extra strength acetaminophen tablets daily).  F/u scheduled with Dr. Earnest Bailey on 11/29; asked pt to f/u with PCP for back pain.   Counseled pt to take her metoprolol BID.   Written pt instructions provided.   Total time in face to face counseling 30 minutes.   Patient seen with Marijo Conception, PharmD Candidate and Maudry Mayhew, Pharmacy Resident.

## 2011-11-11 NOTE — Progress Notes (Signed)
  Subjective:    Patient ID: [name], female    DOB: 10-15-1956, 55 y.o.   MRN: [id number]  HPI Reviewed and agree with Dr. Macky Lower management.    Review of Systems     Objective:   Physical Exam        Assessment & Plan:

## 2011-11-11 NOTE — Patient Instructions (Addendum)
Congratulations on quitting smoking last year! Your lung function today showed that you were near normal.  Please continue to avoid smoking and continue to exercise. Increasing exercise can improve your lung function by strengthening your muscles. We recommend that you start slowly (for example walking once to twice weekly) and work up to 30 minutes 3-5 times per week.  Please follow up with Dr. Earnest Bailey for your back pain. You may try taking acetaminophen (Tylenol) extra strength (500mg ) tablets 1-2 tablets twice a day. Please use no more than 6 tablets (2 tablets three times per day). Thank you for coming in today.

## 2011-11-14 ENCOUNTER — Ambulatory Visit (INDEPENDENT_AMBULATORY_CARE_PROVIDER_SITE_OTHER): Payer: Self-pay | Admitting: Family Medicine

## 2011-11-14 ENCOUNTER — Encounter: Payer: Self-pay | Admitting: Family Medicine

## 2011-11-14 VITALS — BP 104/74 | HR 90 | Temp 98.0°F | Ht 66.0 in | Wt 218.2 lb

## 2011-11-14 DIAGNOSIS — M549 Dorsalgia, unspecified: Secondary | ICD-10-CM

## 2011-11-14 DIAGNOSIS — I252 Old myocardial infarction: Secondary | ICD-10-CM

## 2011-11-14 DIAGNOSIS — R0609 Other forms of dyspnea: Secondary | ICD-10-CM

## 2011-11-14 DIAGNOSIS — K029 Dental caries, unspecified: Secondary | ICD-10-CM | POA: Insufficient documentation

## 2011-11-14 DIAGNOSIS — R0600 Dyspnea, unspecified: Secondary | ICD-10-CM

## 2011-11-14 DIAGNOSIS — F419 Anxiety disorder, unspecified: Secondary | ICD-10-CM

## 2011-11-14 DIAGNOSIS — F411 Generalized anxiety disorder: Secondary | ICD-10-CM

## 2011-11-14 NOTE — Assessment & Plan Note (Signed)
Reviewed xrays from inpatient setting-[facility].  No red flags on history or exam today.  Advised OTC tylenol use, will send to physical therapy.

## 2011-11-14 NOTE — Assessment & Plan Note (Signed)
Right upper molar second from the back, dental caries, evidence of abscess or pulpitis at this point.  Will place dental referral.

## 2011-11-14 NOTE — Progress Notes (Signed)
  Subjective:    Patient ID: [name], female    DOB: 01-12-1956, 55 y.o.   MRN: [id number]  HPI  Back pain:  Continued low back pain of several months duration.  No radiation.  No numbness, weakness, tingling.  Worse at night and wen bending over.  No fever, chills, sweats.  Occasionally takes hydrocodone from ER, and ibuprofen OTC.  Anxiety:  Has not started paroxetine yet- states will pick it up today  Dyspnea:  Uses albuterol near daily- she agrees it is likely anxiety related.  States it makes her feel better.  Dental clinic:  Requests referral for dental clinic for right upper molar dental carie  Review of Systemssee HPI     Objective:   Physical Exam GEN: Alert & Oriented, No acute distress CV:  Regular Rate & Rhythm, no murmur Respiratory:  Normal work of breathing, CTAB Abd:  + BS, soft, no tenderness to palpation Ext: no pre-tibial edema Back: no significant paraspinal tenderness.  Neg straight leg raise.       Assessment & Plan:

## 2011-11-14 NOTE — Assessment & Plan Note (Signed)
Despite telling Dr. Raymondo Band she was taking it, today she states she is not taking Paxil.  She is agreeable and feels like it would be helpful.  Advised on importance of daily dosing.  F/u in 1 month

## 2011-11-14 NOTE — Assessment & Plan Note (Signed)
Normal PFT Nov 2012

## 2011-11-14 NOTE — Patient Instructions (Signed)
Medicine- paroxetine is to help with nerves- its an everyday medicine- not just when you feel bad.  Come back in one month and lets check on how you feel  Will send referral for dental clinic  Will send referral for physical therapy

## 2011-12-02 ENCOUNTER — Emergency Department (HOSPITAL_COMMUNITY): Payer: Self-pay

## 2011-12-02 ENCOUNTER — Encounter (HOSPITAL_COMMUNITY): Payer: Self-pay | Admitting: Emergency Medicine

## 2011-12-02 ENCOUNTER — Other Ambulatory Visit: Payer: Self-pay

## 2011-12-02 ENCOUNTER — Observation Stay (HOSPITAL_COMMUNITY)
Admission: EM | Admit: 2011-12-02 | Discharge: 2011-12-03 | Disposition: A | Discharge status: Home / Self Care | Payer: Self-pay | Attending: Family Medicine | Admitting: Family Medicine

## 2011-12-02 DIAGNOSIS — E669 Obesity, unspecified: Secondary | ICD-10-CM | POA: Diagnosis present

## 2011-12-02 DIAGNOSIS — R0989 Other specified symptoms and signs involving the circulatory and respiratory systems: Secondary | ICD-10-CM | POA: Insufficient documentation

## 2011-12-02 DIAGNOSIS — F411 Generalized anxiety disorder: Secondary | ICD-10-CM | POA: Insufficient documentation

## 2011-12-02 DIAGNOSIS — R0789 Other chest pain: Secondary | ICD-10-CM

## 2011-12-02 DIAGNOSIS — I10 Essential (primary) hypertension: Secondary | ICD-10-CM

## 2011-12-02 DIAGNOSIS — R079 Chest pain, unspecified: Principal | ICD-10-CM | POA: Diagnosis present

## 2011-12-02 DIAGNOSIS — R0609 Other forms of dyspnea: Secondary | ICD-10-CM | POA: Insufficient documentation

## 2011-12-02 DIAGNOSIS — I2510 Atherosclerotic heart disease of native coronary artery without angina pectoris: Secondary | ICD-10-CM | POA: Insufficient documentation

## 2011-12-02 HISTORY — DX: Shortness of breath: R06.02

## 2011-12-02 NOTE — ED Notes (Signed)
PT. REPORTS LEFT CHEST PAIN AND LOW BACK PAIN / RIGHT LEG PAIN AND LEFT ARM PAIN ONSET TODAY , SLIGHT SOB , NO NAUSEA OR VOMITING .

## 2011-12-03 ENCOUNTER — Encounter (HOSPITAL_COMMUNITY): Payer: Self-pay | Admitting: *Deleted

## 2011-12-03 ENCOUNTER — Other Ambulatory Visit: Payer: Self-pay

## 2011-12-03 LAB — CARDIAC PANEL(CRET KIN+CKTOT+MB+TROPI)
CK, MB: 1.3 ng/mL (ref 0.3–4.0)
Relative Index: INVALID (ref 0.0–2.5)
Total CK: 51 U/L (ref 7–177)
Troponin I: <0.3 ng/mL (ref <0.30)
Troponin I: <0.3 ng/mL (ref <0.30)
Troponin I: <0.3 ng/mL (ref <0.30)

## 2011-12-03 LAB — DIFFERENTIAL
Basophils Absolute: 0.1 10*3/uL (ref 0.0–0.1)
Basophils Relative: 1 % (ref 0–1)
Eosinophils Relative: 4 % (ref 0–5)
Monocytes Absolute: 0.5 10*3/uL (ref 0.1–1.0)
Neutro Abs: 4.4 10*3/uL (ref 1.7–7.7)

## 2011-12-03 LAB — BASIC METABOLIC PANEL
Calcium: 9.6 mg/dL (ref 8.4–10.5)
Creatinine, Ser: 0.75 mg/dL (ref 0.50–1.10)
GFR calc Af Amer: >90 mL/min (ref >90)
GFR calc non Af Amer: >90 mL/min (ref >90)

## 2011-12-03 LAB — CBC
HCT: 41 % (ref 36.0–46.0)
MCHC: 32.2 g/dL (ref 30.0–36.0)
RDW: 15.1 % (ref 11.5–15.5)

## 2011-12-03 LAB — HEMOGLOBIN A1C
Hgb A1c MFr Bld: 5.7 % — ABNORMAL HIGH (ref <5.7)
Mean Plasma Glucose: 117 mg/dL — ABNORMAL HIGH (ref <117)

## 2011-12-03 LAB — POCT I-STAT TROPONIN I: Troponin i, poc: 0 ng/mL (ref 0.00–0.08)

## 2011-12-03 MED ORDER — ASPIRIN EC 81 MG PO TBEC: 81.0000 mg | DELAYED_RELEASE_TABLET | Freq: Every day | ORAL | Status: DC

## 2011-12-03 MED ORDER — NITROGLYCERIN 0.4 MG SL SUBL
0.4000 mg | SUBLINGUAL_TABLET | SUBLINGUAL | Status: DC | PRN
  Administered 2011-12-03 (×2): 0.4 mg via SUBLINGUAL

## 2011-12-03 MED ORDER — ALPRAZOLAM 0.25 MG PO TABS: 0.2500 mg | ORAL_TABLET | Freq: Two times a day (BID) | ORAL | Status: DC | PRN

## 2011-12-03 MED ORDER — SODIUM CHLORIDE 0.9 % IV SOLN: 250.0000 mL | INTRAVENOUS | Status: DC | PRN

## 2011-12-03 MED ORDER — SODIUM CHLORIDE 0.9 % IV SOLN
Freq: Once | INTRAVENOUS | Status: AC
  Administered 2011-12-03: 02:00:00 via INTRAVENOUS

## 2011-12-03 MED ORDER — SIMVASTATIN 20 MG PO TABS
20.0000 mg | ORAL_TABLET | Freq: Every day | ORAL | Status: DC
  Administered 2011-12-03: 20 mg via ORAL
  Filled 2011-12-03: qty 1

## 2011-12-03 MED ORDER — METOPROLOL TARTRATE 25 MG PO TABS
25.0000 mg | ORAL_TABLET | Freq: Two times a day (BID) | ORAL | Status: DC
  Administered 2011-12-03: 25 mg via ORAL
  Filled 2011-12-03 (×2): qty 1

## 2011-12-03 MED ORDER — PAROXETINE HCL 20 MG PO TABS
20.0000 mg | ORAL_TABLET | Freq: Every day | ORAL | Status: DC
  Administered 2011-12-03: 20 mg via ORAL
  Filled 2011-12-03: qty 1

## 2011-12-03 MED ORDER — ASPIRIN 300 MG RE SUPP
300.0000 mg | RECTAL | Status: DC
  Filled 2011-12-03: qty 1

## 2011-12-03 MED ORDER — HEPARIN SODIUM (PORCINE) 5000 UNIT/ML IJ SOLN
5000.0000 [IU] | Freq: Three times a day (TID) | INTRAMUSCULAR | Status: DC
  Administered 2011-12-03: 5000 [IU] via SUBCUTANEOUS
  Filled 2011-12-03 (×4): qty 1

## 2011-12-03 MED ORDER — LEVOTHYROXINE SODIUM 112 MCG PO TABS
112.0000 ug | ORAL_TABLET | Freq: Every day | ORAL | Status: DC
  Administered 2011-12-03: 112 ug via ORAL
  Filled 2011-12-03 (×2): qty 1

## 2011-12-03 MED ORDER — ALBUTEROL SULFATE HFA 108 (90 BASE) MCG/ACT IN AERS
2.0000 | INHALATION_SPRAY | Freq: Four times a day (QID) | RESPIRATORY_TRACT | Status: DC | PRN
  Filled 2011-12-03: qty 6.7

## 2011-12-03 MED ORDER — ONDANSETRON HCL 4 MG/2ML IJ SOLN: 4.0000 mg | Freq: Four times a day (QID) | INTRAMUSCULAR | Status: DC | PRN

## 2011-12-03 MED ORDER — ASPIRIN 81 MG PO CHEW: 324.0000 mg | CHEWABLE_TABLET | ORAL | Status: DC

## 2011-12-03 MED ORDER — ACETAMINOPHEN 325 MG PO TABS
650.0000 mg | ORAL_TABLET | ORAL | Status: DC | PRN
  Filled 2011-12-03 (×2): qty 2

## 2011-12-03 MED ORDER — SODIUM CHLORIDE 0.9 % IJ SOLN: 3.0000 mL | INTRAMUSCULAR | Status: DC | PRN

## 2011-12-03 MED ORDER — SODIUM CHLORIDE 0.9 % IJ SOLN: 3.0000 mL | Freq: Two times a day (BID) | INTRAMUSCULAR | Status: DC

## 2011-12-03 MED ORDER — NITROGLYCERIN 0.4 MG SL SUBL: 0.4000 mg | SUBLINGUAL_TABLET | SUBLINGUAL | Status: DC | PRN

## 2011-12-03 NOTE — H&P (Signed)
I have seen and examined this patient. I have discussed with Dr Ayesha Mohair.  I agree with their findings and plans as documented in their admission note.  Nonspecific chest pain, probably muscular origin given localizing pain in region of left pectoral muscle insertion site around sternum with resisted left arm internal rotation.  Patient stable for discharge once cardiac enzyme rule out completed.  ;May treat with NSAIDS once r./o completed.

## 2011-12-03 NOTE — ED Provider Notes (Signed)
History     CSN: [id number] Arrival date & time: 12/02/2011  8:58 PM   None     Chief Complaint  Patient presents with  . Chest Pain    (Consider location/radiation/quality/duration/timing/severity/associated sxs/prior treatment) HPI This is a 55 year old white female with a history of coronary artery disease. She is complaining of left upper chest pain that began yesterday morning. It is intermittent. There is no known exacerbating or mitigating factor. The pain is described as both sharp and pressure-like. It radiates to the left arm. It is accompanied by dyspnea but no diaphoresis or nausea. It has been severe at times but is mild at the present time. She also complains of right lower back pain radiating down the back of the right leg. This is worse with movement, and moderate in severity. She states her lower legs are swollen, right greater the left.  Past Medical History  Diagnosis Date  . Myocardial infarction 1997  . Coronary artery disease   . Hypertension   . Arthritis   . Problem with literacy     Past Surgical History  Procedure Date  . Ectopic pregnancy surgery     right    Family History  Problem Relation Age of Onset  . Cancer Mother 49    lung ca  . Diabetes Mother   . Anxiety disorder Sister   . Cancer Brother 60    lung    History  Substance Use Topics  . Smoking status: Former Smoker -- 1.0 packs/day for 3 years    Quit date: 12/03/2010  . Smokeless tobacco: Not on file  . Alcohol Use: No    OB History    Grav Para Term Preterm Abortions TAB SAB Ect Mult Living                  Review of Systems  All other systems reviewed and are negative.    Allergies  Penicillins  Home Medications   Current Outpatient Rx  Name Route Sig Dispense Refill  . ALBUTEROL SULFATE HFA 108 (90 BASE) MCG/ACT IN AERS Inhalation Inhale 2 puffs into the lungs every 6 (six) hours as needed. For wheezing or sob    . ALPRAZOLAM 0.25 MG PO TABS Oral Take 0.25  mg by mouth daily as needed.      . ASPIRIN EC 81 MG PO TBEC Oral Take 1 tablet (81 mg total) by mouth daily.  2  . CYCLOBENZAPRINE HCL 10 MG PO TABS Oral Take 10 mg by mouth 3 (three) times daily as needed. For muscle spasms     . FUROSEMIDE 20 MG PO TABS Oral Take 20 mg by mouth daily as needed.      Marland Kitchen LEVOTHYROXINE SODIUM 112 MCG PO TABS Oral Take 1 tablet (112 mcg total) by mouth daily. Dispense generic 30 tablet 1  . PAROXETINE HCL 20 MG PO TABS Oral Take 20 mg by mouth daily.     Marland Kitchen PRAVASTATIN SODIUM 40 MG PO TABS Oral Take 40 mg by mouth daily.     Marland Kitchen METOPROLOL TARTRATE 25 MG PO TABS Oral Take 1 tablet (25 mg total) by mouth 2 (two) times daily. 60 tablet 11    BP 95/67  Pulse 57  Temp(Src) 97.9 F (36.6 C) (Oral)  Resp 12  SpO2 100%  Physical Exam General: Well-developed, well-nourished female in no acute distress; appearance consistent with age of record HENT: normocephalic, atraumatic Eyes: pupils equal round and reactive to light; extraocular muscles intact Neck: supple  Heart: regular rate and rhythm; no murmurs Lungs: clear to auscultation bilaterally Abdomen: soft; nontender; nondistended; bowel sounds present Extremities: No deformity; full range of motion; edema of lower legs Neurologic: Awake, alert and oriented; motor function intact in all extremities and symmetric; no facial droop Skin: Warm and dry Psychiatric: Flat affect    ED Course  Procedures (including critical care time)     MDM   Nursing notes and vitals signs, including pulse oximetry, reviewed.  Summary of this visit's results, reviewed by myself:  Labs:  Results for orders placed during the hospital encounter of 12/02/11  CBC      Component Value Range   WBC 8.0  4.0 - 10.5 (K/uL)   RBC 4.92  3.87 - 5.11 (MIL/uL)   Hemoglobin 13.2  12.0 - 15.0 (g/dL)   HCT 96.0  45.4 - 09.8 (%)   MCV 83.3  78.0 - 100.0 (fL)   MCH 26.8  26.0 - 34.0 (pg)   MCHC 32.2  30.0 - 36.0 (g/dL)   RDW 11.9   14.7 - 82.9 (%)   Platelets 306  150 - 400 (K/uL)  DIFFERENTIAL      Component Value Range   Neutrophils Relative 55  43 - 77 (%)   Neutro Abs 4.4  1.7 - 7.7 (K/uL)   Lymphocytes Relative 34  12 - 46 (%)   Lymphs Abs 2.7  0.7 - 4.0 (K/uL)   Monocytes Relative 6  3 - 12 (%)   Monocytes Absolute 0.5  0.1 - 1.0 (K/uL)   Eosinophils Relative 4  0 - 5 (%)   Eosinophils Absolute 0.3  0.0 - 0.7 (K/uL)   Basophils Relative 1  0 - 1 (%)   Basophils Absolute 0.1  0.0 - 0.1 (K/uL)  BASIC METABOLIC PANEL      Component Value Range   Sodium 138  135 - 145 (mEq/L)   Potassium 4.3  3.5 - 5.1 (mEq/L)   Chloride 102  96 - 112 (mEq/L)   CO2 26  19 - 32 (mEq/L)   Glucose, Bld 91  70 - 99 (mg/dL)   BUN 14  6 - 23 (mg/dL)   Creatinine, Ser 5.62  0.50 - 1.10 (mg/dL)   Calcium 9.6  8.4 - 13.0 (mg/dL)   GFR calc non Af Amer >90  >90 (mL/min)   GFR calc Af Amer >90  >90 (mL/min)  POCT I-STAT TROPONIN I      Component Value Range   Troponin i, poc 0.00  0.00 - 0.08 (ng/mL)   Comment 3             Imaging Studies: Dg Chest 2 View  12/02/2011  *RADIOLOGY REPORT*  Clinical Data: Chest pain  CHEST - 2 VIEW  Comparison: 09/30/2011  Findings: Mild linear left lung base opacity.  Otherwise no focal consolidation.  No pleural effusion or pneumothorax. Cardiomediastinal contours within normal limits.  No acute osseous abnormality.  IMPRESSION: No acute process identified.  Original Report Authenticated By: Waneta Martins, M.D.   EKG Interpretation:  Date & Time: 12/03/2011 09:08 PM  Rate: 61  Rhythm: normal sinus rhythm  QRS Axis: normal  Intervals: normal  ST/T Wave abnormalities: normal  Conduction Disutrbances:none  Narrative Interpretation:   Old EKG Reviewed: No significant changes  3:24 AM Pain-free after sublingual nitroglycerin.             Hanley Seamen, MD 12/03/11 [phone]

## 2011-12-03 NOTE — ED Notes (Signed)
Patient presents stating that she was SOB earlier today but it got worse throughout the day.  Also c/o CP and her legs hurting at times.  No acute distress noted at this time

## 2011-12-03 NOTE — H&P (Signed)
Family Medicine Teaching [facility] Admission History and Physical  Patient name: [name] Medical record number: [id number] Date of birth: 11/12/1956 Age: 55 y.o. Gender: female  Primary Care Provider: Delbert Harness, MD, MD  Chief Complaint: Chest pain  History of Present Illness: [name] is a 55 y.o. year old female presenting with chest pain times one day. Her chest pain began this morning while she was watching TV. She states that was left-sided began is pressure and this began to be sharp. It had no radiation. This was accompanied by shortness of breath, but not nausea, sweatiness.  The cell typical for her chest pain. She states that she gets chest pain anytime she walks even for a short distance. Patient had an MI in 1997, however since then she has quit smoking, and follow closely with Dr. Algie Coffer.  In October she had a negative nuclear stress test, and a normal cardiac catheterization. She reports that she had some mild cough preceding this chest pain but denies fever or great illness. She has not been sedentary prior to this. She states that her legs are slightly swollen but this has been going on for a long time and they are unchanged. Review Of Systems: Per HPI with the following additions: No changes in bowel or bladder, no headache Otherwise 12 point review of systems was performed and was unremarkable.  Patient Active Problem List  Diagnoses  . Obesity  . Palpitations  . History of myocardial infarction  . Hypothyroidism  . Hyperlipidemia LDL goal < 100  . Arm pain  . Back pain  . Tremor  . Insomnia  . Dyspnea  . Chest pain  . Anxiety  . Dental caries    Past Medical History: Past Medical History  Diagnosis Date  . Myocardial infarction 1997  . Coronary artery disease   . Hypertension   . Arthritis   . Problem with literacy     Past Surgical History: Past Surgical History  Procedure Date  . Ectopic pregnancy surgery     right     Social History: History   Social History  . Marital Status: Legally Separated    Spouse Name: N/A    Number of Children: N/A  . Years of Education: N/A   Social History Main Topics  . Smoking status: Former Smoker -- 1.0 packs/day for 3 years    Quit date: 12/03/2010  . Smokeless tobacco: None  . Alcohol Use: No  . Drug Use: No  . Sexually Active: Yes    Birth Control/ Protection: Post-menopausal   Other Topics Concern  . None   Social History Narrative   Lives with a female friend, just roommates, separated.  Has 4 kids, all grown.  Finished 10th grade.  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      Family History: Family History  Problem Relation Age of Onset  . Cancer Mother 82    lung ca  . Diabetes Mother   . Anxiety disorder Sister   . Cancer Brother 60    lung    Allergies: Allergies  Allergen Reactions  . Penicillins Rash    Current Facility-Administered Medications  Medication Dose Route Frequency Provider Last Rate Last Dose  . 0.9 %  sodium chloride infusion   Intravenous Once Carlisle Beers Molpus, MD 20 mL/hr at 12/03/11 0150    . DISCONTD: nitroGLYCERIN (NITROSTAT) SL tablet 0.4 mg  0.4 mg Sublingual Q5 min PRN Hanley Seamen, MD   0.4  mg at 12/03/11 0207   Current Outpatient Prescriptions  Medication Sig Dispense Refill  . albuterol (PROVENTIL HFA;VENTOLIN HFA) 108 (90 BASE) MCG/ACT inhaler Inhale 2 puffs into the lungs every 6 (six) hours as needed. For wheezing or sob      . ALPRAZolam (XANAX) 0.25 MG tablet Take 0.25 mg by mouth daily as needed.        Marland Kitchen aspirin EC 81 MG tablet Take 1 tablet (81 mg total) by mouth daily.    2  . cyclobenzaprine (FLEXERIL) 10 MG tablet Take 10 mg by mouth 3 (three) times daily as needed. For muscle spasms       . furosemide (LASIX) 20 MG tablet Take 20 mg by mouth daily as needed.        Marland Kitchen levothyroxine (SYNTHROID) 112 MCG tablet Take 1 tablet (112 mcg total) by mouth daily. Dispense generic  30 tablet  1  .  PARoxetine (PAXIL) 20 MG tablet Take 20 mg by mouth daily.       . pravastatin (PRAVACHOL) 40 MG tablet Take 40 mg by mouth daily.       . metoprolol tartrate (LOPRESSOR) 25 MG tablet Take 1 tablet (25 mg total) by mouth 2 (two) times daily.  60 tablet  11     Physical Exam: BP 95/67  Pulse 57  Temp(Src) 97.9 F (36.6 C) (Oral)  Resp 12  SpO2 100%            General: alert, cooperative and no distress HEENT: PERRLA and extra ocular movement intact Heart: S1, S2 normal, no murmur, rub or gallop, regular rate and rhythm Lungs: clear to auscultation, no wheezes or rales and unlabored breathing Abdomen: abdomen is soft without significant tenderness, masses, organomegaly or guarding Extremities: edema trace bilaterally Musculoskeletal: Chest pain is reproducible with palpation of left side of rib cage. Skin:no rashes Neurology: normal without focal findings, mental status, speech normal, alert and oriented x3, PERLA, cranial nerves 2-12 intact and gait and station normal  Labs and Imaging: Lab Results  Component Value Date/Time   NA 138 12/02/2011 11:08 PM   K 4.3 12/02/2011 11:08 PM   CL 102 12/02/2011 11:08 PM   CO2 26 12/02/2011 11:08 PM   BUN 14 12/02/2011 11:08 PM   CREATININE 0.75 12/02/2011 11:08 PM   CREATININE 0.68 07/04/2011 12:01 PM   GLUCOSE 91 12/02/2011 11:08 PM   Lab Results  Component Value Date   WBC 8.0 12/02/2011   HGB 13.2 12/02/2011   HCT 41.0 12/02/2011   MCV 83.3 12/02/2011   PLT 306 12/02/2011   Dg Chest 2 View  12/02/2011  *RADIOLOGY REPORT*  Clinical Data: Chest pain  CHEST - 2 VIEW  Comparison: 09/30/2011  Findings: Mild linear left lung base opacity.  Otherwise no focal consolidation.  No pleural effusion or pneumothorax. Cardiomediastinal contours within normal limits.  No acute osseous abnormality.  IMPRESSION: No acute process identified.  Original Report Authenticated By: Waneta Martins, M.D.        Assessment and Plan: [name] is a 55 y.o. year old female presenting with chest pain. 1. chest pain-pain is reproducible upon palpation. She has had extensive workup for this chest pain in the last several months. We will admit her and cycle cardiac enzymes. We'll also check a EKG in the morning. We'll place on telemetry while ruling out myocardial infarction. The ED doctor has already drawn a d-dimer. If this is elevated, we will get a lower extremity Doppler.  If she rules out, we will start Motrin for her likely musculoskeletal chest pain. 2.  hypertension-continue patient's home meds. 3.  Anxiety-continue home meds of Xanax and Paxil 4. FEN/GI: We'll saline lock IV. Heart healthy diet. 5. Prophylaxis: Heparin subcutaneous 6. Disposition: Pending negative cardiac enzymes.   SignedEllery Plunk, MD Family Medicine Resident PGY-3 12/03/2011 3:46 AM  [phone]

## 2011-12-03 NOTE — Discharge Summary (Signed)
Physician Discharge Summary  Patient ID: [name] MRN: [id number] DOB/AGE: 1956-05-01 55 y.o.  Admit date: 12/02/2011 Discharge date: 12/03/2011  Admission Diagnoses: Chest pain  Discharge Diagnoses: Chest pain, possibly secondary to musculoskeletal pain. MI ruled out.  Principal Problem:  *Chest pain Active Problems:  Obesity   Discharged Condition: stable  Hospital Course:  [name] is a very nice 55 y.o. year old female with a history of anxiety, hyperlipidemia and hypertension who presented with chest pain times one day. Was admitted to rule out MI.   1. Chest pain, MI rule out-Put on Nitro, Heparin, Metoprolol, Simvastatin and Aspirin. Patient had POC troponin done which was negative. EKG read as normal. Telemetry showed no unusual rhythms or rates. 2 sets of cardiac enzymes were drawn, both found to be negavtive. Chest xray read as showing no acute process. Patient also had negative myoview and cardiac cath in 10/12.  2. Hypertension-patient was continued on home medication of Metoprolol. Blood pressure stable during stay.  3. Anxiety-patient was continued on home medication of Xanax. No episodes anxiety during stay.   Follow-Up Issues: 1. Bilateral anterior shin swellng 2. MSK Chest Pain   Consults: none  Significant Diagnostic Studies: cardiac graphics: ECG: Normal, Cardiac enzymes: negative, Tele: no abnormal readings, CXR: no acute process  Treatments: cardiac meds: Nitro, Metoprolol, Simvastatin, Aspirin and anticoagulation: Heparin  Discharge Exam: Blood pressure 109/76, pulse 76, temperature 98.4 F (36.9 C), temperature source Oral, resp. rate 18, SpO2 96.00%. Gen: alert, oriented, no distress, pleasant Cardiac: RRR, no murmurs Chest Wall: mild tenderness to palpation along left sternum Lungs: CTA-B Extremities: mild bruising of dorsal surface of feet bilaterally; mild non-pitting edema of ankles, 2+ DP pulses and sensation intact  bilat.   Disposition: Home or Self Care  Discharge Orders    Future Appointments: Provider: Department: Dept Phone: Center:   12/13/2011 1:30 PM Delbert Harness, MD [facility] Resident [phone] [facility]     Future Orders Please Complete By Expires   Discharge patient        Medication List  As of 12/03/2011  2:31 PM   CONTINUE taking these medications         albuterol 108 (90 BASE) MCG/ACT inhaler   Commonly known as: PROVENTIL HFA;VENTOLIN HFA      ALPRAZolam 0.25 MG tablet   Commonly known as: XANAX      aspirin EC 81 MG tablet      cyclobenzaprine 10 MG tablet   Commonly known as: FLEXERIL      furosemide 20 MG tablet   Commonly known as: LASIX      levothyroxine 112 MCG tablet   Commonly known as: SYNTHROID, LEVOTHROID   Take 1 tablet (112 mcg total) by mouth daily. Dispense generic      metoprolol tartrate 25 MG tablet   Commonly known as: LOPRESSOR   Take 1 tablet (25 mg total) by mouth 2 (two) times daily.      PARoxetine 20 MG tablet   Commonly known as: PAXIL      pravastatin 40 MG tablet   Commonly known as: PRAVACHOL           Follow-up Information    Follow up with Delbert Harness, MD on 12/13/2011.         SignedMat Carne 12/03/2011, 2:31 PM

## 2011-12-03 NOTE — ED Notes (Signed)
PT COMPLAINING OF SOB-PT PLACED ON 2LPM O2 BY N/C

## 2011-12-13 ENCOUNTER — Ambulatory Visit: Payer: Self-pay | Admitting: Family Medicine

## 2011-12-18 ENCOUNTER — Ambulatory Visit (INDEPENDENT_AMBULATORY_CARE_PROVIDER_SITE_OTHER): Payer: Self-pay | Admitting: Family Medicine

## 2011-12-18 VITALS — BP 130/80 | HR 64 | Temp 98.0°F | Ht 66.0 in | Wt 224.0 lb

## 2011-12-18 DIAGNOSIS — F419 Anxiety disorder, unspecified: Secondary | ICD-10-CM

## 2011-12-18 DIAGNOSIS — E569 Vitamin deficiency, unspecified: Secondary | ICD-10-CM

## 2011-12-18 DIAGNOSIS — R0609 Other forms of dyspnea: Secondary | ICD-10-CM

## 2011-12-18 DIAGNOSIS — R0600 Dyspnea, unspecified: Secondary | ICD-10-CM

## 2011-12-18 DIAGNOSIS — F411 Generalized anxiety disorder: Secondary | ICD-10-CM

## 2011-12-18 DIAGNOSIS — R079 Chest pain, unspecified: Secondary | ICD-10-CM

## 2011-12-18 DIAGNOSIS — R0989 Other specified symptoms and signs involving the circulatory and respiratory systems: Secondary | ICD-10-CM

## 2011-12-18 DIAGNOSIS — M791 Myalgia, unspecified site: Secondary | ICD-10-CM

## 2011-12-18 DIAGNOSIS — IMO0001 Reserved for inherently not codable concepts without codable children: Secondary | ICD-10-CM

## 2011-12-18 DIAGNOSIS — E039 Hypothyroidism, unspecified: Secondary | ICD-10-CM

## 2011-12-18 LAB — COMPREHENSIVE METABOLIC PANEL
AST: 18 U/L (ref 0–37)
Alkaline Phosphatase: 77 U/L (ref 39–117)
BUN: 19 mg/dL (ref 6–23)
Glucose, Bld: 78 mg/dL (ref 70–99)
Sodium: 139 mEq/L (ref 135–145)
Total Bilirubin: 0.6 mg/dL (ref 0.3–1.2)
Total Protein: 6.7 g/dL (ref 6.0–8.3)

## 2011-12-18 MED ORDER — PAROXETINE HCL 40 MG PO TABS: 40.0000 mg | ORAL_TABLET | Freq: Every day | ORAL | Status: DC

## 2011-12-18 MED ORDER — MELOXICAM 15 MG PO TABS: 15.0000 mg | ORAL_TABLET | Freq: Every day | ORAL | Status: DC

## 2011-12-18 NOTE — Patient Instructions (Signed)
Increase paxil to 40 mg per day (take two until you are out and then pick up new prescription)  Use flexeril (muscle relaxant) and meloxicam to help with pain.  Will order an ECHO to check on your heart.  Bring all of your medications to next visit in 1-2 weeks

## 2011-12-18 NOTE — Assessment & Plan Note (Addendum)
Will complete workup for myalgias with Vit D, ESR, CRP, repeat TS, Vit D, CMET.  Has had CK, CBC in hospital last week.    Update;: ESR wnl, CRP mildly elevated.  Not likely inflammatory myalgias.

## 2011-12-18 NOTE — Assessment & Plan Note (Signed)
Anxiety combined with poor health literacy likley contributing to multiple ER visits and poor compliance.  Will increase paroxetine to 40 mg daily.  I am unsure she is taking it.  Asked her to follow-up in 1-2 weeks and bring all her meds for a med recheck.

## 2011-12-18 NOTE — Assessment & Plan Note (Signed)
MSK chest pain.  Will start flexeril and meloxicam

## 2011-12-18 NOTE — Progress Notes (Signed)
  Subjective:    Patient ID: [name], female    DOB: Jul 04, 1956, 56 y.o.   MRN: [id number]  HPI Here for hospital follow-up  Continued presentation to ER for multipel pain complaints, chest pain.  Was admitted and ruled out for ACS.    Continued body pain, chest pain, dyspnea, anxiety.  Is unsure what medication she is taking.  Did not bring them with her today.  States allover 10/10 pain  Was placed on lasix in hospital for LE edema.  No further workup obtained. Review of Systems See HPI    Objective:   Physical Exam GEN: Alert & Oriented, No acute distress CV:  Regular Rate & Rhythm, no murmur Respiratory:  Normal work of breathing, CTAB Abd:  + BS, soft, no tenderness to palpation Ext: trace edema bilaterally ,no skin changes          Assessment & Plan:

## 2011-12-18 NOTE — Assessment & Plan Note (Signed)
Symptoms of dyspnea have not changes, but now with some LE edema.  Has had normal D dimer and BNP in hospital in past.  PFt's normal.  Will get ECHO to complete workup.

## 2011-12-19 ENCOUNTER — Telehealth: Payer: Self-pay | Admitting: *Deleted

## 2011-12-19 DIAGNOSIS — E559 Vitamin D deficiency, unspecified: Secondary | ICD-10-CM | POA: Insufficient documentation

## 2011-12-19 LAB — SEDIMENTATION RATE: Sed Rate: 5 mm/hr (ref 0–22)

## 2011-12-19 LAB — VITAMIN D 25 HYDROXY (VIT D DEFICIENCY, FRACTURES): Vit D, 25-Hydroxy: 25 ng/mL — ABNORMAL LOW (ref 30–89)

## 2011-12-19 NOTE — Telephone Encounter (Signed)
Called and informed patient of appointment for 2-D Echo at [facility] on 12/26/11 at 1 pm. Also told patient to bring insurance card to appointment because we do not have scanned into chart.[name], [name]

## 2011-12-19 NOTE — Assessment & Plan Note (Signed)
Would benefit from repleting.  Unable to reach patient by phone, has indicated she has trouble with reading.  Will discuss further when she comes for 1-2 week follow-up

## 2011-12-19 NOTE — Assessment & Plan Note (Signed)
TSH again elevated.  Patient with poor health literacy- not taking medications regularly.  Will have her return and bring all meds with her for patient education

## 2011-12-26 ENCOUNTER — Ambulatory Visit (HOSPITAL_COMMUNITY)
Admission: RE | Admit: 2011-12-26 | Discharge: 2011-12-26 | Disposition: A | Discharge status: Home / Self Care | Payer: Self-pay | Source: Ambulatory Visit | Attending: Family Medicine | Admitting: Family Medicine

## 2011-12-26 DIAGNOSIS — I252 Old myocardial infarction: Secondary | ICD-10-CM | POA: Insufficient documentation

## 2011-12-26 DIAGNOSIS — I10 Essential (primary) hypertension: Secondary | ICD-10-CM | POA: Insufficient documentation

## 2011-12-26 DIAGNOSIS — I059 Rheumatic mitral valve disease, unspecified: Secondary | ICD-10-CM | POA: Insufficient documentation

## 2011-12-26 DIAGNOSIS — I319 Disease of pericardium, unspecified: Secondary | ICD-10-CM | POA: Insufficient documentation

## 2011-12-26 DIAGNOSIS — R0609 Other forms of dyspnea: Secondary | ICD-10-CM | POA: Insufficient documentation

## 2011-12-26 DIAGNOSIS — R0600 Dyspnea, unspecified: Secondary | ICD-10-CM

## 2011-12-26 DIAGNOSIS — I079 Rheumatic tricuspid valve disease, unspecified: Secondary | ICD-10-CM | POA: Insufficient documentation

## 2011-12-26 DIAGNOSIS — E669 Obesity, unspecified: Secondary | ICD-10-CM | POA: Insufficient documentation

## 2011-12-26 DIAGNOSIS — E785 Hyperlipidemia, unspecified: Secondary | ICD-10-CM | POA: Insufficient documentation

## 2011-12-26 DIAGNOSIS — R0989 Other specified symptoms and signs involving the circulatory and respiratory systems: Secondary | ICD-10-CM | POA: Insufficient documentation

## 2011-12-26 NOTE — Progress Notes (Signed)
  Echocardiogram 2D Echocardiogram has been performed.  Clide Deutscher RDCS 12/26/2011, 2:12 PM

## 2011-12-27 ENCOUNTER — Ambulatory Visit (INDEPENDENT_AMBULATORY_CARE_PROVIDER_SITE_OTHER): Payer: Self-pay | Admitting: Family Medicine

## 2011-12-27 ENCOUNTER — Encounter: Payer: Self-pay | Admitting: Family Medicine

## 2011-12-27 DIAGNOSIS — E039 Hypothyroidism, unspecified: Secondary | ICD-10-CM

## 2011-12-27 DIAGNOSIS — IMO0001 Reserved for inherently not codable concepts without codable children: Secondary | ICD-10-CM

## 2011-12-27 DIAGNOSIS — J449 Chronic obstructive pulmonary disease, unspecified: Secondary | ICD-10-CM

## 2011-12-27 DIAGNOSIS — M791 Myalgia, unspecified site: Secondary | ICD-10-CM

## 2011-12-27 DIAGNOSIS — R0600 Dyspnea, unspecified: Secondary | ICD-10-CM

## 2011-12-27 DIAGNOSIS — R0609 Other forms of dyspnea: Secondary | ICD-10-CM

## 2011-12-27 MED ORDER — ERGOCALCIFEROL 1.25 MG (50000 UT) PO CAPS: 50000.0000 [IU] | ORAL_CAPSULE | ORAL | Status: DC

## 2011-12-27 NOTE — Progress Notes (Signed)
  Subjective:    Patient ID: [name], female    DOB: August 14, 1956, 56 y.o.   MRN: [id number]  HPI Here to follow-up dyspnea, labwork, ECHO  Dyspnea:  Continued to get winded when walking.  Resolved when resting.  Walks once to twice daily with a friend.  Unclear distance.  No chest pain. Reviewed ECHO, Grade 1 diastolic dysfunction. PFT's wnl.  Anxiety:  Got saved and will be baptized  Soon.  Continued worried continuously, sad as she is coming upon 2 year anniversary of death of fiancee.  NO SI, HI  Elevated TSH: does not take medications, denies new symtpoms   Notes she is not currently on any medications due to cannot even afford $4 meds.  States MAP program had ordered all of her meds and will subsidize them for free and she may pick them up in the next 1-2 weeks.   Review of Systemssee above     Objective:   Physical Exam  GEN: Alert & Oriented, No acute distress CV:  Regular Rate & Rhythm, no murmur Respiratory:  Normal work of breathing, CTAB Abd:  + BS, soft, no tenderness to palpation Ext: no pre-tibial edema       Assessment & Plan:

## 2011-12-27 NOTE — Assessment & Plan Note (Signed)
Not taking meds, likely contributing to some symptoms.  States she will get meds in 1-2 weeks.  Will follow-up.

## 2011-12-27 NOTE — Assessment & Plan Note (Addendum)
Likely multifactorial with COPD, deconditioning, and anxiety all playing roles.  Unable to afford any medication- even $4 meds.  Will wait until she is optimized on other treatment before considering stepping up tx for COPD.  Currently on albuterol prn but does not currently have money for any.   Encouraged increasing exercise slowly, smoking cessation

## 2011-12-27 NOTE — Patient Instructions (Signed)
Follow-up in February after you are able to get your medicines  Congratulations on your church!  Bring your medicines to every visit!

## 2011-12-27 NOTE — Assessment & Plan Note (Signed)
Workup does not suggest inflammatory etiology.  Likely multifactorial.  Elevated TSH and LOW vitamin also likely contributing some.  Unable to afford meds now.  Anticipates receiving MAP program assistance in next few weeks.  Will follow-up after able to restart meds.

## 2012-01-08 ENCOUNTER — Other Ambulatory Visit: Payer: Self-pay | Admitting: Family Medicine

## 2012-01-08 MED ORDER — ALBUTEROL SULFATE HFA 108 (90 BASE) MCG/ACT IN AERS: 2.0000 | INHALATION_SPRAY | Freq: Four times a day (QID) | RESPIRATORY_TRACT | Status: DC | PRN

## 2012-01-09 ENCOUNTER — Other Ambulatory Visit: Payer: Self-pay | Admitting: Family Medicine

## 2012-01-09 DIAGNOSIS — N6310 Unspecified lump in the right breast, unspecified quadrant: Secondary | ICD-10-CM

## 2012-01-14 ENCOUNTER — Other Ambulatory Visit: Payer: Self-pay | Admitting: Family Medicine

## 2012-01-14 MED ORDER — METOPROLOL SUCCINATE ER 50 MG PO TB24: 50.0000 mg | ORAL_TABLET | Freq: Every day | ORAL | Status: DC

## 2012-01-14 MED ORDER — ATORVASTATIN CALCIUM 10 MG PO TABS: 10.0000 mg | ORAL_TABLET | Freq: Every day | ORAL | Status: DC

## 2012-01-28 ENCOUNTER — Ambulatory Visit: Payer: Self-pay | Admitting: Family Medicine

## 2012-02-05 ENCOUNTER — Encounter (HOSPITAL_COMMUNITY): Payer: Self-pay | Admitting: *Deleted

## 2012-02-05 ENCOUNTER — Emergency Department (HOSPITAL_COMMUNITY)
Admission: EM | Admit: 2012-02-05 | Discharge: 2012-02-06 | Disposition: A | Discharge status: Home Health | Payer: Self-pay | Attending: Emergency Medicine | Admitting: Emergency Medicine

## 2012-02-05 ENCOUNTER — Emergency Department (HOSPITAL_COMMUNITY): Payer: Self-pay

## 2012-02-05 DIAGNOSIS — I2510 Atherosclerotic heart disease of native coronary artery without angina pectoris: Secondary | ICD-10-CM | POA: Insufficient documentation

## 2012-02-05 DIAGNOSIS — J40 Bronchitis, not specified as acute or chronic: Secondary | ICD-10-CM | POA: Insufficient documentation

## 2012-02-05 DIAGNOSIS — I10 Essential (primary) hypertension: Secondary | ICD-10-CM | POA: Insufficient documentation

## 2012-02-05 DIAGNOSIS — R079 Chest pain, unspecified: Secondary | ICD-10-CM | POA: Insufficient documentation

## 2012-02-05 DIAGNOSIS — R05 Cough: Secondary | ICD-10-CM | POA: Insufficient documentation

## 2012-02-05 DIAGNOSIS — Z79899 Other long term (current) drug therapy: Secondary | ICD-10-CM | POA: Insufficient documentation

## 2012-02-05 DIAGNOSIS — I252 Old myocardial infarction: Secondary | ICD-10-CM | POA: Insufficient documentation

## 2012-02-05 DIAGNOSIS — R059 Cough, unspecified: Secondary | ICD-10-CM | POA: Insufficient documentation

## 2012-02-05 HISTORY — DX: Headache, unspecified: R51.9

## 2012-02-05 HISTORY — DX: Headache: R51

## 2012-02-05 HISTORY — DX: Anxiety disorder, unspecified: F41.9

## 2012-02-05 HISTORY — DX: Bronchitis, not specified as acute or chronic: J40

## 2012-02-05 HISTORY — DX: Other chronic pain: G89.29

## 2012-02-05 LAB — BASIC METABOLIC PANEL
BUN: 17 mg/dL (ref 6–23)
CO2: 23 mEq/L (ref 19–32)
Chloride: 100 mEq/L (ref 96–112)
Creatinine, Ser: 0.81 mg/dL (ref 0.50–1.10)
Glucose, Bld: 95 mg/dL (ref 70–99)

## 2012-02-05 LAB — D-DIMER, QUANTITATIVE: D-Dimer, Quant: 0.38 ug/mL-FEU (ref 0.00–0.48)

## 2012-02-05 LAB — DIFFERENTIAL
Lymphocytes Relative: 29 % (ref 12–46)
Monocytes Absolute: 0.5 10*3/uL (ref 0.1–1.0)
Monocytes Relative: 6 % (ref 3–12)
Neutro Abs: 4.3 10*3/uL (ref 1.7–7.7)

## 2012-02-05 LAB — CBC
HCT: 39.9 % (ref 36.0–46.0)
Hemoglobin: 12.6 g/dL (ref 12.0–15.0)
WBC: 7.1 10*3/uL (ref 4.0–10.5)

## 2012-02-05 MED ORDER — ONDANSETRON 8 MG PO TBDP
8.0000 mg | ORAL_TABLET | Freq: Once | ORAL | Status: AC
  Administered 2012-02-05: 8 mg via ORAL
  Filled 2012-02-05: qty 1

## 2012-02-05 MED ORDER — ALBUTEROL SULFATE (5 MG/ML) 0.5% IN NEBU
5.0000 mg | INHALATION_SOLUTION | Freq: Once | RESPIRATORY_TRACT | Status: AC
  Administered 2012-02-05: 5 mg via RESPIRATORY_TRACT
  Filled 2012-02-05: qty 1

## 2012-02-05 MED ORDER — IPRATROPIUM BROMIDE 0.02 % IN SOLN
0.5000 mg | Freq: Once | RESPIRATORY_TRACT | Status: AC
  Administered 2012-02-05: 0.5 mg via RESPIRATORY_TRACT
  Filled 2012-02-05: qty 2.5

## 2012-02-05 NOTE — ED Notes (Signed)
Pt alert, nad, c/o cough, congestion, onset several days ago, states bilateral rib pain, resp even unlabored, skin pwd, denies n/v or sob

## 2012-02-05 NOTE — ED Provider Notes (Signed)
9:28 PM Medical screening exam.    Pt with hx MI, family hx blood clots reports 3 days of right sided chest pain and SOB, followed by one day of cough.  Pt also has right leg pain without injury.  Patient's right chest is tender to palpation and she states this reproduces pain; however, patient also states pain is occasionally exacerbated by deep inspiration and by walking up stairs.  Patient to be moved to the main ED out of fast track for further work up.    Dillard Cannon [place], [place] 02/05/12 2130

## 2012-02-05 NOTE — ED Provider Notes (Signed)
Medical screening examination/treatment/procedure(s) were performed by non-physician practitioner and as supervising physician I was immediately available for consultation/collaboration. Devoria Albe, MD, Armando Gang   Ward Givens, MD 02/05/12 [phone]

## 2012-02-05 NOTE — ED Notes (Signed)
MD at bedside. Dammen, PA at bedside.

## 2012-02-05 NOTE — ED Provider Notes (Signed)
History     CSN: [id number]  Arrival date & time 02/05/12  4098   First MD Initiated Contact with Patient 02/05/12 2117      Chief Complaint  Patient presents with  . Cough  . Chest Pain    Ribs     HPI  History provided by the patient. Patient is a 56 year old female who is former smoker with past history of hypertension, chronic bronchitis, anxiety, coronary artery disease who presents with complaints of right chest pain, cough and nausea over the past 4 days. Symptoms began gradually and have worsened. Cough is nonproductive. She denies hemoptysis. Patient denies any fever, chills, sweats. Patient reports right lateral chest wall pain worse with deep breathing and coughing. Pain is improved at rest. Patient denies any fall or injury to the chest. Patient also has complaints of bilateral lower extremity swelling. She reports some tightness and tenderness to the legs predominantly on the right side. Patient has not taken any medication to use a treatment for her symptoms. Symptoms are described as moderate. She denies any other aggravating or alleviating factors.    Past Medical History  Diagnosis Date  . Myocardial infarction 1997  . Coronary artery disease   . Hypertension   . Arthritis   . Problem with literacy   . Shortness of breath   . Anxiety   . Bronchitis   . Chronic headaches     Past Surgical History  Procedure Date  . Ectopic pregnancy surgery     right    Family History  Problem Relation Age of Onset  . Cancer Mother 72    lung ca  . Diabetes Mother   . Anxiety disorder Sister   . Cancer Brother 60    lung    History  Substance Use Topics  . Smoking status: Former Smoker -- 1.0 packs/day for 3 years    Quit date: 12/03/2010  . Smokeless tobacco: Not on file  . Alcohol Use: No    OB History    Grav Para Term Preterm Abortions TAB SAB Ect Mult Living                  Review of Systems  Constitutional: Positive for appetite change. Negative  for fever, chills and diaphoresis.  Respiratory: Positive for cough and shortness of breath.   Cardiovascular: Positive for chest pain. Negative for palpitations.  Gastrointestinal: Positive for nausea. Negative for vomiting, abdominal pain, diarrhea and constipation.  Genitourinary: Negative for dysuria, frequency and flank pain.  Skin: Negative for rash.  All other systems reviewed and are negative.    Allergies  Penicillins  Home Medications   Current Outpatient Rx  Name Route Sig Dispense Refill  . ALBUTEROL SULFATE HFA 108 (90 BASE) MCG/ACT IN AERS Inhalation Inhale 2 puffs into the lungs every 6 (six) hours as needed. For wheezing or sob 1 Inhaler 5  . ALPRAZOLAM 0.25 MG PO TABS Oral Take 0.25 mg by mouth daily as needed. anxiety    . ASPIRIN EC 81 MG PO TBEC Oral Take 1 tablet (81 mg total) by mouth daily.  2  . CYCLOBENZAPRINE HCL 10 MG PO TABS Oral Take 10 mg by mouth 3 (three) times daily as needed. For muscle spasms     . FUROSEMIDE 20 MG PO TABS Oral Take 20 mg by mouth daily.    Marland Kitchen LEVOTHYROXINE SODIUM 125 MCG PO TABS Oral Take 125 mcg by mouth daily.    Marland Kitchen METOPROLOL TARTRATE 25 MG PO  TABS Oral Take 25 mg by mouth 2 (two) times daily.    Marland Kitchen PAROXETINE HCL 40 MG PO TABS Oral Take 1 tablet (40 mg total) by mouth daily. 30 tablet 11  . PRAVASTATIN SODIUM 40 MG PO TABS Oral Take 40 mg by mouth daily.      BP 146/92  Pulse 71  Temp(Src) 98 F (36.7 C) (Oral)  Resp 16  Wt 224 lb (101.606 kg)  SpO2 100%  Physical Exam  Nursing note and vitals reviewed. Constitutional: She is oriented to person, place, and time. She appears well-developed and well-nourished. No distress.  HENT:  Head: Normocephalic and atraumatic.  Cardiovascular: Normal rate and regular rhythm.   Pulmonary/Chest: Effort normal. No respiratory distress. She has wheezes. She has no rales. She exhibits tenderness.       Reproducible right lateral chest wall tenderness.  Abdominal: Soft. She exhibits no  distension. There is tenderness in the left lower quadrant. There is no rebound, no guarding and no CVA tenderness.  Musculoskeletal: She exhibits edema.       Mild bilateral lower extremity swelling. Skin normal without erythema or increased warmth. Negative Holman's sign.  Neurological: She is alert and oriented to person, place, and time.  Skin: Skin is warm and dry. No rash noted.  Psychiatric: She has a normal mood and affect. Her behavior is normal.    ED Course  Procedures   Results for orders placed during the hospital encounter of 02/05/12  CBC      Component Value Range   WBC 7.1  4.0 - 10.5 (K/uL)   RBC 4.80  3.87 - 5.11 (MIL/uL)   Hemoglobin 12.6  12.0 - 15.0 (g/dL)   HCT 27.2  53.6 - 64.4 (%)   MCV 83.1  78.0 - 100.0 (fL)   MCH 26.3  26.0 - 34.0 (pg)   MCHC 31.6  30.0 - 36.0 (g/dL)   RDW 03.4 (*) 74.2 - 15.5 (%)   Platelets 274  150 - 400 (K/uL)  DIFFERENTIAL      Component Value Range   Neutrophils Relative 60  43 - 77 (%)   Neutro Abs 4.3  1.7 - 7.7 (K/uL)   Lymphocytes Relative 29  12 - 46 (%)   Lymphs Abs 2.1  0.7 - 4.0 (K/uL)   Monocytes Relative 6  3 - 12 (%)   Monocytes Absolute 0.5  0.1 - 1.0 (K/uL)   Eosinophils Relative 4  0 - 5 (%)   Eosinophils Absolute 0.3  0.0 - 0.7 (K/uL)   Basophils Relative 0  0 - 1 (%)   Basophils Absolute 0.0  0.0 - 0.1 (K/uL)  BASIC METABOLIC PANEL      Component Value Range   Sodium 135  135 - 145 (mEq/L)   Potassium 3.7  3.5 - 5.1 (mEq/L)   Chloride 100  96 - 112 (mEq/L)   CO2 23  19 - 32 (mEq/L)   Glucose, Bld 95  70 - 99 (mg/dL)   BUN 17  6 - 23 (mg/dL)   Creatinine, Ser 5.95  0.50 - 1.10 (mg/dL)   Calcium 9.4  8.4 - 63.8 (mg/dL)   GFR calc non Af Amer 80 (*) >90 (mL/min)   GFR calc Af Amer >90  >90 (mL/min)  D-DIMER, QUANTITATIVE      Component Value Range   D-Dimer, Quant 0.38  0.00 - 0.48 (ug/mL-FEU)  TROPONIN I      Component Value Range   Troponin I <0.30  <0.30 (  ng/mL)      Dg Chest 2  View  02/05/2012  *RADIOLOGY REPORT*  Clinical Data: Right chest pain, cough and shortness of breath.  Ex- smoker.  CHEST - 2 VIEW  Comparison: 12/02/2011.  Findings: Normal sized heart.  Clear lungs.  Stable mild diffuse peribronchial thickening and accentuation of the interstitial markings.  The bones appear somewhat osteopenic.  IMPRESSION: Stable mild chronic bronchitic changes.  No acute abnormality.  Original Report Authenticated By: Darrol Angel, M.D.     1. Bronchitis       MDM  11:15PM patient seen and evaluated. Patient in no acute distress.      Date: 02/05/2012  Rate: 74  Rhythm: normal sinus rhythm  QRS Axis: normal  Intervals: normal  ST/T Wave abnormalities: normal  Conduction Disutrbances:none  Narrative Interpretation:   Old EKG Reviewed: No significant changes from 12/03/2011       Angus Seller, PA 02/06/12 [phone]

## 2012-02-06 MED ORDER — PREDNISONE 10 MG PO TABS: ORAL_TABLET | ORAL | Status: DC

## 2012-02-06 MED ORDER — ALBUTEROL SULFATE HFA 108 (90 BASE) MCG/ACT IN AERS
2.0000 | INHALATION_SPRAY | RESPIRATORY_TRACT | Status: DC | PRN
  Administered 2012-02-06: 2 via RESPIRATORY_TRACT
  Filled 2012-02-06: qty 6.7

## 2012-02-06 NOTE — ED Provider Notes (Signed)
Medical screening examination/treatment/procedure(s) were performed by non-physician practitioner and as supervising physician I was immediately available for consultation/collaboration.   Dayton Bailiff, MD 02/06/12 [phone]

## 2012-02-06 NOTE — Discharge Instructions (Signed)
Your labs and tests today have not shown any signs for concerning or emergent conditions to explain you symptoms.  At this time your providers her symptoms are caused from bronchitis. You have been given an albuterol inhaler to use for your symptoms. Please use this for taking 2 puffs every 4 hours as needed.  Bronchitis Bronchitis is the body's way of reacting to injury and/or infection (inflammation) of the bronchi. Bronchi are the air tubes that extend from the windpipe into the lungs. If the inflammation becomes severe, it may cause shortness of breath. CAUSES  Inflammation may be caused by:  A virus.   Germs (bacteria).   Dust.   Allergens.   Pollutants and many other irritants.  The cells lining the bronchial tree are covered with tiny hairs (cilia). These constantly beat upward, away from the lungs, toward the mouth. This keeps the lungs free of pollutants. When these cells become too irritated and are unable to do their job, mucus begins to develop. This causes the characteristic cough of bronchitis. The cough clears the lungs when the cilia are unable to do their job. Without either of these protective mechanisms, the mucus would settle in the lungs. Then you would develop pneumonia. Smoking is a common cause of bronchitis and can contribute to pneumonia. Stopping this habit is the single most important thing you can do to help yourself. TREATMENT   Your caregiver may prescribe an antibiotic if the cough is caused by bacteria. Also, medicines that open up your airways make it easier to breathe. Your caregiver may also recommend or prescribe an expectorant. It will loosen the mucus to be coughed up. Only take over-the-counter or prescription medicines for pain, discomfort, or fever as directed by your caregiver.   Removing whatever causes the problem (smoking, for example) is critical to preventing the problem from getting worse.   Cough suppressants may be prescribed for relief of  cough symptoms.   Inhaled medicines may be prescribed to help with symptoms now and to help prevent problems from returning.   For those with recurrent (chronic) bronchitis, there may be a need for steroid medicines.  SEEK IMMEDIATE MEDICAL CARE IF:   During treatment, you develop more pus-like mucus (purulent sputum).   You have a fever.   Your baby is older than 3 months with a rectal temperature of 102 F (38.9 C) or higher.   Your baby is 77 months old or younger with a rectal temperature of 100.4 F (38 C) or higher.   You become progressively more ill.   You have increased difficulty breathing, wheezing, or shortness of breath.  It is necessary to seek immediate medical care if you are elderly or sick from any other disease. MAKE SURE YOU:   Understand these instructions.   Will watch your condition.   Will get help right away if you are not doing well or get worse.  Document Released: 12/02/2005 Document Revised: 08/14/2011 Document Reviewed: 10/11/2008 [facility] Patient Information 2012 [place], [place].

## 2012-02-08 ENCOUNTER — Emergency Department (HOSPITAL_COMMUNITY)
Admission: EM | Admit: 2012-02-08 | Discharge: 2012-02-08 | Disposition: A | Discharge status: Home / Self Care | Payer: Self-pay | Attending: Emergency Medicine | Admitting: Emergency Medicine

## 2012-02-08 ENCOUNTER — Encounter (HOSPITAL_COMMUNITY): Payer: Self-pay | Admitting: Adult Health

## 2012-02-08 DIAGNOSIS — Z8739 Personal history of other diseases of the musculoskeletal system and connective tissue: Secondary | ICD-10-CM | POA: Insufficient documentation

## 2012-02-08 DIAGNOSIS — I252 Old myocardial infarction: Secondary | ICD-10-CM | POA: Insufficient documentation

## 2012-02-08 DIAGNOSIS — I10 Essential (primary) hypertension: Secondary | ICD-10-CM | POA: Insufficient documentation

## 2012-02-08 DIAGNOSIS — X503XXA Overexertion from repetitive movements, initial encounter: Secondary | ICD-10-CM | POA: Insufficient documentation

## 2012-02-08 DIAGNOSIS — T148XXA Other injury of unspecified body region, initial encounter: Secondary | ICD-10-CM | POA: Insufficient documentation

## 2012-02-08 DIAGNOSIS — Z79899 Other long term (current) drug therapy: Secondary | ICD-10-CM | POA: Insufficient documentation

## 2012-02-08 DIAGNOSIS — I2510 Atherosclerotic heart disease of native coronary artery without angina pectoris: Secondary | ICD-10-CM | POA: Insufficient documentation

## 2012-02-08 MED ORDER — IBUPROFEN 800 MG PO TABS: 800.0000 mg | ORAL_TABLET | Freq: Three times a day (TID) | ORAL | Status: AC

## 2012-02-08 MED ORDER — IBUPROFEN 800 MG PO TABS
800.0000 mg | ORAL_TABLET | Freq: Once | ORAL | Status: AC
  Administered 2012-02-08: 800 mg via ORAL
  Filled 2012-02-08: qty 1

## 2012-02-08 NOTE — ED Provider Notes (Signed)
History     CSN: [id number]  Arrival date & time 02/08/12  1455   First MD Initiated Contact with Patient 02/08/12 1514      Chief Complaint  Patient presents with  . Arm Pain    HPI The patient presents with pain in her left arm.  The pain was present upon awakening, approximately 8 hours ago.  Since that time the pain has been persistent, throbbing, focally about the forearm with radiation towards the wrist.  The pain is worse with motion.  The patient has not attempted any relief with medications.  She denies any distal dysesthesia, any weakness, any other pain or focal complaints.  Denies any chest pain, dyspnea, edema. Notably, the patient recalls moving heavy objects yesterday.  She denies any trauma, any memorable events, but does reiterate that the object mood was very heavy. The patient notes that since a presentation 2 days ago for mild cough, back condition has improved.  She denies any new shortness of breath, any new chest pain, any lightheadedness, any new nausea, vomiting, or other complaints. Past Medical History  Diagnosis Date  . Myocardial infarction 1997  . Coronary artery disease   . Hypertension   . Arthritis   . Problem with literacy   . Shortness of breath   . Anxiety   . Bronchitis   . Chronic headaches     Past Surgical History  Procedure Date  . Ectopic pregnancy surgery     right    Family History  Problem Relation Age of Onset  . Cancer Mother 48    lung ca  . Diabetes Mother   . Anxiety disorder Sister   . Cancer Brother 60    lung    History  Substance Use Topics  . Smoking status: Former Smoker -- 1.0 packs/day for 3 years    Quit date: 12/03/2010  . Smokeless tobacco: Not on file  . Alcohol Use: No    OB History    Grav Para Term Preterm Abortions TAB SAB Ect Mult Living                  Review of Systems  All other systems reviewed and are negative.    Allergies  Penicillins  Home Medications   Current Outpatient  Rx  Name Route Sig Dispense Refill  . ALBUTEROL SULFATE HFA 108 (90 BASE) MCG/ACT IN AERS Inhalation Inhale 2 puffs into the lungs every 6 (six) hours as needed. For wheezing or sob 1 Inhaler 5  . ALPRAZOLAM 0.25 MG PO TABS Oral Take 0.25 mg by mouth daily as needed. anxiety    . ASPIRIN EC 81 MG PO TBEC Oral Take 1 tablet (81 mg total) by mouth daily.  2  . FUROSEMIDE 20 MG PO TABS Oral Take 20 mg by mouth daily.    Marland Kitchen LEVOTHYROXINE SODIUM 125 MCG PO TABS Oral Take 125 mcg by mouth daily.    Marland Kitchen METOPROLOL TARTRATE 25 MG PO TABS Oral Take 25 mg by mouth 2 (two) times daily.    Marland Kitchen PAROXETINE HCL 40 MG PO TABS Oral Take 1 tablet (40 mg total) by mouth daily. 30 tablet 11  . PRAVASTATIN SODIUM 40 MG PO TABS Oral Take 40 mg by mouth daily.    Marland Kitchen PREDNISONE 10 MG PO TABS  Take 6 tabs on day one, take 5 tablets on day 2, take 4 tablets on day 3, take 3 tabs a day 4, take 2 tabs on day 5, take  one tablet on day 6 21 tablet 0  . IBUPROFEN 800 MG PO TABS Oral Take 1 tablet (800 mg total) by mouth every 8 (eight) hours. 12 tablet 0    BP 131/74  Pulse 65  Temp(Src) 98.5 F (36.9 C) (Oral)  Resp 20  SpO2 97%  Physical Exam  Nursing note and vitals reviewed. Constitutional: She is oriented to person, place, and time. She appears well-developed and well-nourished. No distress.  HENT:  Head: Normocephalic and atraumatic.  Eyes: Conjunctivae and EOM are normal.  Cardiovascular: Normal rate and intact distal pulses.   Pulmonary/Chest: Effort normal. No stridor. No respiratory distress.  Musculoskeletal: She exhibits no edema.       Left shoulder: Normal.       Left wrist: Normal.       Arms: Neurological: She is alert and oriented to person, place, and time. No cranial nerve deficit.  Skin: Skin is warm and dry.  Psychiatric: She has a normal mood and affect.    ED Course  Procedures (including critical care time)  Labs Reviewed - No data to display No results found.   1. Muscle strain        MDM  This female with multiple medical problems now presents with one day of left forearm pain on exam she is in no distress.  Patient has no risk factors for PE, nor any overt physical exam stigmata for this condition.  The patient's recollection of moving heavy objects the day prior to the onset of her discomfort suggestive of muscle strain.  The patient's physical exam his reassuring.  The patient was discharged in stable condition with a short course of NSAIDs.        Gerhard Munch, MD 02/08/12 1547

## 2012-02-08 NOTE — Discharge Instructions (Signed)
Muscle Strain A muscle strain (pulled muscle) happens when a muscle is over-stretched. Recovery usually takes 5 to 6 weeks.  HOME CARE   Put ice on the injured area.   Put ice in a plastic bag.   Place a towel between your skin and the bag.   Leave the ice on for 15 to 20 minutes at a time, every hour for the first 2 days.   Do not use the muscle for several days or until your doctor says you can. Do not use the muscle if you have pain.   Wrap the injured area with an elastic bandage for comfort. Do not put it on too tightly.   Only take medicine as told by your doctor.   Warm up before exercise. This helps prevent muscle strains.  GET HELP RIGHT AWAY IF:  There is increased pain or puffiness (swelling) in the affected area. MAKE SURE YOU:   Understand these instructions.   Will watch your condition.   Will get help right away if you are not doing well or get worse.  Document Released: 09/10/2008 Document Revised: 08/14/2011 Document Reviewed: 09/10/2008 [employer] Patient Information 2012 [employer]. 

## 2012-02-08 NOTE — ED Notes (Signed)
Reports 2 days of left lower forearm and left wrist pain that began 2 days ago. Pt states, "my arm is swollen up" no edema or deformity noted. CMS intact.

## 2012-03-04 ENCOUNTER — Encounter (HOSPITAL_COMMUNITY): Payer: Self-pay

## 2012-03-04 ENCOUNTER — Emergency Department (INDEPENDENT_AMBULATORY_CARE_PROVIDER_SITE_OTHER)
Admission: EM | Admit: 2012-03-04 | Discharge: 2012-03-04 | Disposition: A | Discharge status: Home / Self Care | Payer: Self-pay | Source: Home / Self Care | Attending: Family Medicine | Admitting: Family Medicine

## 2012-03-04 ENCOUNTER — Emergency Department (INDEPENDENT_AMBULATORY_CARE_PROVIDER_SITE_OTHER): Payer: Self-pay

## 2012-03-04 ENCOUNTER — Telehealth: Payer: Self-pay | Admitting: Family Medicine

## 2012-03-04 DIAGNOSIS — IMO0002 Reserved for concepts with insufficient information to code with codable children: Secondary | ICD-10-CM

## 2012-03-04 DIAGNOSIS — M179 Osteoarthritis of knee, unspecified: Secondary | ICD-10-CM

## 2012-03-04 DIAGNOSIS — M1710 Unilateral primary osteoarthritis, unspecified knee: Secondary | ICD-10-CM

## 2012-03-04 MED ORDER — DICLOFENAC POTASSIUM 50 MG PO TABS: 50.0000 mg | ORAL_TABLET | Freq: Three times a day (TID) | ORAL | Status: DC

## 2012-03-04 NOTE — Telephone Encounter (Signed)
Spoke with patient again. She is not able to come in here today.  Consulted with Dr. Earnest Bailey and she advised that patient needs evaluation of the leg and the knots that she is referring to. Patient states one knot is on the side of her leg and one is developing in calf of leg.   Advised that she needs this evaluated today. She voices understanding and will plan to go to Urgent Care.

## 2012-03-04 NOTE — Discharge Instructions (Signed)
Try heat and elevation and medication as prescribed and contact your doctor for further evaluation if further problems.

## 2012-03-04 NOTE — Telephone Encounter (Signed)
Spoke with patient and she states swelling of right leg. More at ankle and knee . States there is a knot mid leg and at the knee. States there is popping at the knee .Leg is not sore to touch but hurts to walk on . Offered appointment today as work in and ask if she can come in now. She will have to try to get a ride and will probably be around 4:00 before she can get here. Advised that will be too late for appointment here but  she can go to Urgent Care.

## 2012-03-04 NOTE — ED Provider Notes (Signed)
History     CSN: [id number]  Arrival date & time 03/04/12  1618   First MD Initiated Contact with Patient 03/04/12 1639      Chief Complaint  Patient presents with  . Leg Pain    (Consider location/radiation/quality/duration/timing/severity/associated sxs/prior treatment) Patient is a 56 y.o. female presenting with knee pain. The history is provided by the patient.  Knee Pain This is a new problem. The current episode started more than 2 days ago. The problem occurs constantly. The problem has been gradually worsening. Associated symptoms comments: Swelling of right lower leg.. The symptoms are aggravated by walking.    Past Medical History  Diagnosis Date  . Myocardial infarction 1997  . Coronary artery disease   . Hypertension   . Arthritis   . Problem with literacy   . Shortness of breath   . Anxiety   . Bronchitis   . Chronic headaches     Past Surgical History  Procedure Date  . Ectopic pregnancy surgery     right    Family History  Problem Relation Age of Onset  . Cancer Mother 83    lung ca  . Diabetes Mother   . Anxiety disorder Sister   . Cancer Brother 60    lung    History  Substance Use Topics  . Smoking status: Former Smoker -- 1.0 packs/day for 3 years    Quit date: 12/03/2010  . Smokeless tobacco: Not on file  . Alcohol Use: No    OB History    Grav Para Term Preterm Abortions TAB SAB Ect Mult Living                  Review of Systems  Constitutional: Negative.   Musculoskeletal: Positive for joint swelling and gait problem. Negative for back pain.    Allergies  Penicillins  Home Medications   Current Outpatient Rx  Name Route Sig Dispense Refill  . ALBUTEROL SULFATE HFA 108 (90 BASE) MCG/ACT IN AERS Inhalation Inhale 2 puffs into the lungs every 6 (six) hours as needed. For wheezing or sob 1 Inhaler 5  . ALPRAZOLAM 0.25 MG PO TABS Oral Take 0.25 mg by mouth daily as needed. anxiety    . ASPIRIN EC 81 MG PO TBEC Oral Take 1  tablet (81 mg total) by mouth daily.  2  . DICLOFENAC POTASSIUM 50 MG PO TABS Oral Take 1 tablet (50 mg total) by mouth 3 (three) times daily. 30 tablet 0  . FUROSEMIDE 20 MG PO TABS Oral Take 20 mg by mouth daily.    Marland Kitchen LEVOTHYROXINE SODIUM 125 MCG PO TABS Oral Take 125 mcg by mouth daily.    Marland Kitchen METOPROLOL TARTRATE 25 MG PO TABS Oral Take 25 mg by mouth 2 (two) times daily.    Marland Kitchen PAROXETINE HCL 40 MG PO TABS Oral Take 1 tablet (40 mg total) by mouth daily. 30 tablet 11  . PRAVASTATIN SODIUM 40 MG PO TABS Oral Take 40 mg by mouth daily.    Marland Kitchen PREDNISONE 10 MG PO TABS  Take 6 tabs on day one, take 5 tablets on day 2, take 4 tablets on day 3, take 3 tabs a day 4, take 2 tabs on day 5, take one tablet on day 6 21 tablet 0    BP 128/75  Pulse 66  Temp(Src) 98.1 F (36.7 C) (Oral)  Resp 16  SpO2 98%  Physical Exam  Nursing note and vitals reviewed. Constitutional: She is oriented to person,  place, and time. She appears well-developed and well-nourished.  Musculoskeletal: She exhibits tenderness. She exhibits no edema.       Right knee: She exhibits abnormal alignment and bony tenderness. She exhibits no swelling, no effusion, no ecchymosis, no erythema, no LCL laxity and no MCL laxity. tenderness found. Medial joint line tenderness noted.       Right foot: Normal. She exhibits no swelling and normal capillary refill.  Neurological: She is alert and oriented to person, place, and time.  Skin: Skin is warm and dry.    ED Course  Procedures (including critical care time)  Labs Reviewed - No data to display Dg Knee Complete 4 Views Right  03/04/2012  *RADIOLOGY REPORT*  Clinical Data: Leg pain  RIGHT KNEE - COMPLETE 4+ VIEW  Comparison: None  Findings: There is no evidence of fracture or dislocation.  There is no evidence of arthropathy or other focal bone abnormality. Soft tissues are unremarkable.  IMPRESSION: Negative exam.  Original Report Authenticated By: Rosealee Albee, M.D.     1.  Degenerative joint disease of knee       MDM  X-rays reviewed and report per radiologist.         Linna Hoff, MD 03/04/12 [id number]

## 2012-03-04 NOTE — Telephone Encounter (Signed)
States that her legs are swelling (right worse) ankle and foot - needs to know if she should go to hosp

## 2012-03-11 ENCOUNTER — Encounter: Payer: Self-pay | Admitting: Family Medicine

## 2012-03-11 ENCOUNTER — Ambulatory Visit (INDEPENDENT_AMBULATORY_CARE_PROVIDER_SITE_OTHER): Payer: Self-pay | Admitting: Family Medicine

## 2012-03-11 VITALS — BP 119/83 | HR 63 | Temp 98.3°F | Ht 66.0 in | Wt 223.6 lb

## 2012-03-11 DIAGNOSIS — F411 Generalized anxiety disorder: Secondary | ICD-10-CM

## 2012-03-11 DIAGNOSIS — E039 Hypothyroidism, unspecified: Secondary | ICD-10-CM

## 2012-03-11 DIAGNOSIS — M25569 Pain in unspecified knee: Secondary | ICD-10-CM

## 2012-03-11 DIAGNOSIS — F419 Anxiety disorder, unspecified: Secondary | ICD-10-CM

## 2012-03-11 DIAGNOSIS — M222X1 Patellofemoral disorders, right knee: Secondary | ICD-10-CM | POA: Insufficient documentation

## 2012-03-11 NOTE — Progress Notes (Signed)
  Subjective:    Patient ID: [name], female    DOB: 1956-06-10, 56 y.o.   MRN: [id number]  HPIurgent care follow-up forknee pain  Right knee pain.  Xrays normal.  Worse in past few weeks when going up stairs.  Popping, no locking. Located medially.  Feels it gets swollen at times with increased activity.  Anxiety:  Feels it may be contributing to her dyspnea.  Excited about [facility] in spring.  Hypthyroidism:  fatgue, not taking meds.  Still not taking any medications- [facility]. States should be in 1-2 more weeks.  Feels she may be able to afford 1 or 2 or her meds.    Review of Systems See HPI    Objective:   Physical Exam GEN: Alert & Oriented, No acute distress CV:  Regular Rate & Rhythm, no murmur Respiratory:  Normal work of breathing, CTAB Abd:  + BS, soft, no tenderness to palpation Knee: Normal to inspection with no erythema or effusion or obvious bony abnormalities. Palpation with no warmth  or patellar tenderness or condyle tenderness..  Medial joint line tender ROM normal in flexion and extension and lower leg rotation. No laxity Non painful patellar compression.          Assessment & Plan:

## 2012-03-11 NOTE — Assessment & Plan Note (Signed)
Encouraged to fill this rpescription

## 2012-03-11 NOTE — Assessment & Plan Note (Signed)
Encouraged to ill fluoxetine as one of the medicines with higher priority.

## 2012-03-11 NOTE — Patient Instructions (Signed)
Slowly increase your walking time daily.    Get your medicine for depression (paroxetine) and [facility]  Will have someone call you to see sports medicine doctor  Follow-up in 3 months

## 2012-03-11 NOTE — Assessment & Plan Note (Signed)
Arthritis vs possible meniscal tear.  She has difficulty both financially and with understanding instructions so will not add new NSAID today.  Will refer to sports med for evaluation, if indicated, knee injection and dedicated instruction for rehab exercises may be a helpful strategy for this patient.

## 2012-03-17 ENCOUNTER — Ambulatory Visit (INDEPENDENT_AMBULATORY_CARE_PROVIDER_SITE_OTHER): Payer: Self-pay | Admitting: Family Medicine

## 2012-03-17 ENCOUNTER — Encounter: Payer: Self-pay | Admitting: Family Medicine

## 2012-03-17 ENCOUNTER — Ambulatory Visit: Payer: Self-pay | Admitting: Family Medicine

## 2012-03-17 VITALS — BP 118/70 | HR 96 | Temp 98.0°F | Ht 68.0 in | Wt 226.0 lb

## 2012-03-17 DIAGNOSIS — R0609 Other forms of dyspnea: Secondary | ICD-10-CM

## 2012-03-17 DIAGNOSIS — R0600 Dyspnea, unspecified: Secondary | ICD-10-CM

## 2012-03-17 DIAGNOSIS — R0989 Other specified symptoms and signs involving the circulatory and respiratory systems: Secondary | ICD-10-CM

## 2012-03-17 DIAGNOSIS — I252 Old myocardial infarction: Secondary | ICD-10-CM

## 2012-03-17 DIAGNOSIS — M25569 Pain in unspecified knee: Secondary | ICD-10-CM

## 2012-03-17 MED ORDER — LEVOTHYROXINE SODIUM 125 MCG PO TABS: 125.0000 ug | ORAL_TABLET | Freq: Every day | ORAL | Status: DC

## 2012-03-17 MED ORDER — PAROXETINE HCL 40 MG PO TABS: 40.0000 mg | ORAL_TABLET | Freq: Every day | ORAL | Status: DC

## 2012-03-17 NOTE — Patient Instructions (Signed)
Use acetaminophen (generic tylenol for pain)  Pickup prescriptions at pharmacy

## 2012-03-18 NOTE — Assessment & Plan Note (Signed)
See previous note 3/27[facility] referral.

## 2012-03-18 NOTE — Progress Notes (Signed)
  Subjective:    Patient ID: [name], female    DOB: Sep 05, 1956, 56 y.o.   MRN: [id number]  HPI  Work in for continues right knee pain and cough  Brings meds in for review- now has gotten Lipitor instead of pravastatin [facility]. Is unsure of what she was supposed to pick up from last appointment- still has not gotten synthroid and SSRI.  Knee pain:  continued knee pain.  No redness or swelling.  Has taken OTC ibuprofen with some relief.  Cough:  conitnued nighttime cough and dyspnea on exertion. No sputum.  No significant worsening.  I have reviewed patient's  PMH, FH, and Social history and Medications as related to this visit.   Review of Systems See HPI    Objective:   Physical Exam GEN: Alert & Oriented, No acute distress CV:  Regular Rate & Rhythm, no murmur Respiratory:  Normal work of breathing, CTAB Knee: no sig swelling or erythema.  No calf pain.      Assessment & Plan:

## 2012-03-18 NOTE — Progress Notes (Signed)
Addended by: Macy Mis on: 03/18/2012 11:02 AM   Modules accepted: Orders, Level of Service

## 2012-03-18 NOTE — Assessment & Plan Note (Addendum)
Continues night cough and occ dyspnea likely due to deconditioning.  No evidence for acute infection, cardiopulmonary source.   History and treatment limited by poor ehalth literacy and inability to get medications.  Will prioritize obtaining synthroid and paxil today- I have printed out another prescription for these two medications with instructions to physically take them to [facility] as a reminder of what she is supposed to pick up.

## 2012-03-27 ENCOUNTER — Ambulatory Visit (INDEPENDENT_AMBULATORY_CARE_PROVIDER_SITE_OTHER): Payer: Self-pay | Admitting: Family Medicine

## 2012-03-27 VITALS — BP 105/72 | Ht 68.0 in | Wt 220.0 lb

## 2012-03-27 DIAGNOSIS — M222X1 Patellofemoral disorders, right knee: Secondary | ICD-10-CM

## 2012-03-27 DIAGNOSIS — M259 Joint disorder, unspecified: Secondary | ICD-10-CM

## 2012-03-27 NOTE — Assessment & Plan Note (Signed)
.   Right knee pain most likely related to patellofemoral syndrome. I explained this type of inflammatory response that is underneath the kneecap using the knee model. We will start her on VM O. exercises. We'll see her back in one month.

## 2012-03-27 NOTE — Patient Instructions (Signed)
You have some inflammation on the underside of your kneecap. This is causing her knee pain. We have started you on some knee exercises. Please do them twice a day. Please also ice her knee at least once a day for 15 minutes. It may see you back in about a month.

## 2012-03-27 NOTE — Progress Notes (Signed)
  Subjective:    Patient ID: [name], female    DOB: Sep 09, 1956, 56 y.o.   MRN: [id number]  HPI  Right knee pain for several weeks. Worse with going up and down stairs, worse with a lot of standing or walking. Pain is primarily in the anterior part of the knee. Sharp sometimes aching. No giving way of the leg. No numbness in the foot or calf. No calf pain. PERTINENT  PMH / PSH: No history of prior specific injury to the right knee and has had no knee surgery.  Review of Systems    has noted no redness, warts or rash gout on the right knee. She has noted some occasional intermittent swelling of the right lower extremity around the knee. Objective:   Physical Exam  Vital signs reviewed. GENERAL: Well developed, well nourished, no acute distress. Obese. KNEES: Bilaterally full range of motion in flexion and extension. Bilaterally crepitus noted on extension. The medial and lateral joint lines on the right knee are nontender. There is no effusion. Negative McMurray. The popliteal space is benign. The calf is soft. Distally she is neurovascularly intact. Quadriceps muscle bulk and tone is symmetrical but poorly developed. IMAGING: For review right knee non-standing reveals mild degenerative changes only.       Assessment & Plan:  #1. Right knee pain most likely related to patellofemoral syndrome. I explained this type of inflammatory response that is underneath the kneecap using the knee model. We will start her on VM O. exercises. We'll see her back in one month.

## 2012-04-01 ENCOUNTER — Emergency Department (HOSPITAL_COMMUNITY)
Admission: EM | Admit: 2012-04-01 | Discharge: 2012-04-01 | Disposition: A | Discharge status: Home / Self Care | Payer: Self-pay | Attending: Emergency Medicine | Admitting: Emergency Medicine

## 2012-04-01 DIAGNOSIS — B372 Candidiasis of skin and nail: Secondary | ICD-10-CM | POA: Insufficient documentation

## 2012-04-01 DIAGNOSIS — I252 Old myocardial infarction: Secondary | ICD-10-CM | POA: Insufficient documentation

## 2012-04-01 DIAGNOSIS — Z8739 Personal history of other diseases of the musculoskeletal system and connective tissue: Secondary | ICD-10-CM | POA: Insufficient documentation

## 2012-04-01 DIAGNOSIS — I2510 Atherosclerotic heart disease of native coronary artery without angina pectoris: Secondary | ICD-10-CM | POA: Insufficient documentation

## 2012-04-01 DIAGNOSIS — I10 Essential (primary) hypertension: Secondary | ICD-10-CM | POA: Insufficient documentation

## 2012-04-01 MED ORDER — FLUCONAZOLE 100 MG PO TABS: 100.0000 mg | ORAL_TABLET | Freq: Every day | ORAL | Status: AC

## 2012-04-01 MED ORDER — OXYCODONE-ACETAMINOPHEN 5-325 MG PO TABS
1.0000 | ORAL_TABLET | Freq: Once | ORAL | Status: AC
  Administered 2012-04-01: 1 via ORAL
  Filled 2012-04-01: qty 1

## 2012-04-01 MED ORDER — OXYCODONE-ACETAMINOPHEN 5-325 MG PO TABS: 1.0000 | ORAL_TABLET | ORAL | Status: AC | PRN

## 2012-04-01 NOTE — Discharge Instructions (Signed)
Use topical treatments for fungus. You can use either clotrimazole or miconazole. Apply this twice a day. Try to keep the area dry.  Cutaneous Candidiasis Cutaneous candidiasis is a condition in which there is an overgrowth of yeast (candida) on the skin. Yeast normally live on the skin, but in small enough numbers not to cause any symptoms. In certain cases, increased growth of the yeast may cause an actual yeast infection. This kind of infection usually occurs in areas of the skin that are constantly warm and moist, such as the armpits or the groin. Yeast is the most common cause of diaper rash in babies and in people who cannot control their bowel movements (incontinence). CAUSES  The fungus that most often causes cutaneous candidiasis is Candida albicans. Conditions that can increase the risk of getting a yeast infection of the skin include:  Obesity.   Pregnancy.   Diabetes.   Taking antibiotic medicine.   Taking birth control pills.   Taking steroid medicines.   Thyroid disease.   An iron or zinc deficiency.   Problems with the immune system.  SYMPTOMS   Red, swollen area of the skin.   Bumps on the skin.   Itchiness.  DIAGNOSIS  The diagnosis of cutaneous candidiasis is usually based on its appearance. Light scrapings of the skin may also be taken and viewed under a microscope to identify the presence of yeast. TREATMENT  Antifungal creams may be applied to the infected skin. In severe cases, oral medicines may be needed.  HOME CARE INSTRUCTIONS   Keep your skin clean and dry.   Maintain a healthy weight.   If you have diabetes, keep your blood sugar under control.  SEEK IMMEDIATE MEDICAL CARE IF:  Your rash continues to spread despite treatment.   You have a fever, chills, or abdominal pain.  Document Released: 08/20/2011 Document Revised: 11/21/2011 Document Reviewed: 08/20/2011 [facility] Patient Information 2012 [place], [place].  Fluconazole tablets What is  this medicine? FLUCONAZOLE (floo KON na zole) is an antifungal medicine. It is used to treat certain kinds of fungal or yeast infections. This medicine may be used for other purposes; ask your health care provider or pharmacist if you have questions. What should I tell my health care provider before I take this medicine? They need to know if you have any of these conditions: -electrolyte abnormalities -history of irregular heart beat -kidney disease -an unusual or allergic reaction to fluconazole, other azole antifungals, medicines, foods, dyes, or preservatives -pregnant or trying to get pregnant -breast-feeding How should I use this medicine? Take this medicine by mouth. Follow the directions on the prescription label. Do not take your medicine more often than directed. Talk to your pediatrician regarding the use of this medicine in children. Special care may be needed. This medicine has been used in children as young as 43 months of age. Overdosage: If you think you have taken too much of this medicine contact a poison control center or emergency room at once. NOTE: This medicine is only for you. Do not share this medicine with others. What if I miss a dose? If you miss a dose, take it as soon as you can. If it is almost time for your next dose, take only that dose. Do not take double or extra doses. What may interact with this medicine? Do not take this medicine with any of the following medications: -cisapride -pimozide -red yeast rice This medicine may also interact with the following medications: -birth control pills -cyclosporine -  diuretics like hydrochlorothiazide -medicines for diabetes that are taken by mouth -medicines for high cholesterol like atorvastatin, lovastatin or simvastatin -phenytoin -ramelteon -rifabutin -rifampin -some medicines for anxiety or sleep -tacrolimus -terfenadine -theophylline -warfarin This list may not describe all possible interactions. Give  your health care provider a list of all the medicines, herbs, non-prescription drugs, or dietary supplements you use. Also tell them if you smoke, drink alcohol, or use illegal drugs. Some items may interact with your medicine. What should I watch for while using this medicine? Visit your doctor or health care professional for regular checkups. If you are taking this medicine for a long time you may need blood work. Tell your doctor if your symptoms do not improve. Some fungal infections need many weeks or months of treatment to cure. Alcohol can increase possible damage to your liver. Avoid alcoholic drinks. If you have a vaginal infection, do not have sex until you have finished your treatment. You can wear a sanitary napkin. Do not use tampons. Wear freshly washed cotton, not synthetic, panties. What side effects may I notice from receiving this medicine? Side effects that you should report to your doctor or health care professional as soon as possible: -allergic reactions like skin rash or itching, hives, swelling of the lips, mouth, tongue, or throat -dark urine -feeling dizzy or faint -irregular heartbeat or chest pain -redness, blistering, peeling or loosening of the skin, including inside the mouth -trouble breathing -unusual bruising or bleeding -vomiting -yellowing of the eyes or skin Side effects that usually do not require medical attention (report to your doctor or health care professional if they continue or are bothersome): -changes in how food tastes -diarrhea -headache -stomach upset or nausea This list may not describe all possible side effects. Call your doctor for medical advice about side effects. You may report side effects to FDA at 1-800-FDA-1088. Where should I keep my medicine? Keep out of the reach of children. Store at room temperature below 30 degrees C (86 degrees F). Throw away any medicine after the expiration date. NOTE: This sheet is a summary. It may not  cover all possible information. If you have questions about this medicine, talk to your doctor, pharmacist, or health care provider.  2012, [employer] Standard. (04/11/2008 4:59:25 PM)  Acetaminophen; Oxycodone tablets What is this medicine? ACETAMINOPHEN; OXYCODONE (a set a MEE noe fen; ox i KOE done) is a pain reliever. It is used to treat mild to moderate pain. This medicine may be used for other purposes; ask your health care provider or pharmacist if you have questions. What should I tell my health care provider before I take this medicine? They need to know if you have any of these conditions: -brain tumor -Crohn's disease, inflammatory bowel disease, or ulcerative colitis -drink more than 3 alcohol containing drinks per day -drug abuse or addiction -head injury -heart or circulation problems -kidney disease or problems going to the bathroom -liver disease -lung disease, asthma, or breathing problems -an unusual or allergic reaction to acetaminophen, oxycodone, other opioid analgesics, other medicines, foods, dyes, or preservatives -pregnant or trying to get pregnant -breast-feeding How should I use this medicine? Take this medicine by mouth with a full glass of water. Follow the directions on the prescription label. Take your medicine at regular intervals. Do not take your medicine more often than directed. Talk to your pediatrician regarding the use of this medicine in children. Special care may be needed. Patients over 18 years old may have a stronger reaction  and need a smaller dose. Overdosage: If you think you have taken too much of this medicine contact a poison control center or emergency room at once. NOTE: This medicine is only for you. Do not share this medicine with others. What if I miss a dose? If you miss a dose, take it as soon as you can. If it is almost time for your next dose, take only that dose. Do not take double or extra doses. What may interact with this  medicine? -alcohol or medicines that contain alcohol -antihistamines -barbiturates like amobarbital, butalbital, butabarbital, methohexital, pentobarbital, phenobarbital, thiopental, and secobarbital -benztropine -drugs for bladder problems like solifenacin, trospium, oxybutynin, tolterodine, hyoscyamine, and methscopolamine -drugs for breathing problems like ipratropium and tiotropium -drugs for certain stomach or intestine problems like propantheline, homatropine methylbromide, glycopyrrolate, atropine, belladonna, and dicyclomine -general anesthetics like etomidate, ketamine, nitrous oxide, propofol, desflurane, enflurane, halothane, isoflurane, and sevoflurane -medicines for depression, anxiety, or psychotic disturbances -medicines for pain like codeine, morphine, pentazocine, buprenorphine, butorphanol, nalbuphine, tramadol, and propoxyphene -medicines for sleep -muscle relaxants -naltrexone -phenothiazines like perphenazine, thioridazine, chlorpromazine, mesoridazine, fluphenazine, prochlorperazine, promazine, and trifluoperazine -scopolamine -trihexyphenidyl This list may not describe all possible interactions. Give your health care provider a list of all the medicines, herbs, non-prescription drugs, or dietary supplements you use. Also tell them if you smoke, drink alcohol, or use illegal drugs. Some items may interact with your medicine. What should I watch for while using this medicine? Tell your doctor or health care professional if your pain does not go away, if it gets worse, or if you have new or a different type of pain. You may develop tolerance to the medicine. Tolerance means that you will need a higher dose of the medication for pain relief. Tolerance is normal and is expected if you take this medicine for a long time. Do not suddenly stop taking your medicine because you may develop a severe reaction. Your body becomes used to the medicine. This does NOT mean you are addicted.  Addiction is a behavior related to getting and using a drug for a nonmedical reason. If you have pain, you have a medical reason to take pain medicine. Your doctor will tell you how much medicine to take. If your doctor wants you to stop the medicine, the dose will be slowly lowered over time to avoid any side effects. You may get drowsy or dizzy. Do not drive, use machinery, or do anything that needs mental alertness until you know how this medicine affects you. Do not stand or sit up quickly, especially if you are an older patient. This reduces the risk of dizzy or fainting spells. Alcohol may interfere with the effect of this medicine. Avoid alcoholic drinks. The medicine will cause constipation. Try to have a bowel movement at least every 2 to 3 days. If you do not have a bowel movement for 3 days, call your doctor or health care professional. Do not take Tylenol (acetaminophen) or medicines that have acetaminophen with this medicine. Too much acetaminophen can be very dangerous. Many nonprescription medicines contain acetaminophen. Always read the labels carefully to avoid taking more acetaminophen. What side effects may I notice from receiving this medicine? Side effects that you should report to your doctor or health care professional as soon as possible: -allergic reactions like skin rash, itching or hives, swelling of the face, lips, or tongue -breathing difficulties, wheezing -confusion -light headedness or fainting spells -severe stomach pain -yellowing of the skin or the whites of the eyes  Side effects that usually do not require medical attention (report to your doctor or health care professional if they continue or are bothersome): -dizziness -drowsiness -nausea -vomiting This list may not describe all possible side effects. Call your doctor for medical advice about side effects. You may report side effects to FDA at 1-800-FDA-1088. Where should I keep my medicine? Keep out of the  reach of children. This medicine can be abused. Keep your medicine in a safe place to protect it from theft. Do not share this medicine with anyone. Selling or giving away this medicine is dangerous and against the law. Store at room temperature between 20 and 25 degrees C (68 and 77 degrees F). Keep container tightly closed. Protect from light. Flush any unused medicines down the toilet. Do not use the medicine after the expiration date. NOTE: This sheet is a summary. It may not cover all possible information. If you have questions about this medicine, talk to your doctor, pharmacist, or health care provider.  2012, [employer]vier/Gold Standard. (10/31/2008 10:01:21 AM)

## 2012-04-01 NOTE — ED Notes (Signed)
DR Preston Fleeting IN TO SEE PT

## 2012-04-01 NOTE — ED Notes (Signed)
To ed for eval of "rash" to lower abd under skin folds.

## 2012-04-01 NOTE — ED Provider Notes (Signed)
History     CSN: [id number]  Arrival date & time 04/01/12  1612   First MD Initiated Contact with Patient 04/01/12 1723      Chief Complaint  Patient presents with  . Rash    (Consider location/radiation/quality/duration/timing/severity/associated sxs/prior treatment) Patient is a 56 y.o. female presenting with rash. The history is provided by the patient.  Rash   She has had a painful rash in her lower abdomen in an area of the skin of for the last 2 weeks. It has been getting worse. Pain is 9/10. She has treated it with washing with soap and water which has not been effective. She denies fever, chills, sweats. She has seen her physician for this who had recommended the watching. She's also noted some urinary frequency. She does not have a history of diabetes.  Past Medical History  Diagnosis Date  . Myocardial infarction 1997  . Coronary artery disease   . Hypertension   . Arthritis   . Problem with literacy   . Shortness of breath   . Anxiety   . Bronchitis   . Chronic headaches     Past Surgical History  Procedure Date  . Ectopic pregnancy surgery     right    Family History  Problem Relation Age of Onset  . Cancer Mother 31    lung ca  . Diabetes Mother   . Anxiety disorder Sister   . Cancer Brother 60    lung    History  Substance Use Topics  . Smoking status: Former Smoker -- 1.0 packs/day for 3 years    Quit date: 12/03/2010  . Smokeless tobacco: Not on file  . Alcohol Use: No    OB History    Grav Para Term Preterm Abortions TAB SAB Ect Mult Living                  Review of Systems  Skin: Positive for rash.  All other systems reviewed and are negative.    Allergies  Penicillins  Home Medications   Current Outpatient Rx  Name Route Sig Dispense Refill  . ALBUTEROL SULFATE HFA 108 (90 BASE) MCG/ACT IN AERS Inhalation Inhale 2 puffs into the lungs every 6 (six) hours as needed. For wheezing or sob 1 Inhaler 5  . ASPIRIN EC 81 MG PO  TBEC Oral Take 1 tablet (81 mg total) by mouth daily.  2  . ATORVASTATIN CALCIUM 10 MG PO TABS Oral Take 10 mg by mouth daily. MAP    . METOPROLOL SUCCINATE ER 50 MG PO TB24 Oral Take 50 mg by mouth daily. Take with or immediately following a meal.      BP 106/68  Pulse 65  Temp(Src) 98.7 F (37.1 C) (Oral)  Resp 16  SpO2 97%  Physical Exam  Nursing note and vitals reviewed. Obese 56 year old female who is resting comfortably and in no acute distress. Vital signs are normal. Oxygen saturation is 97% which is normal. Head is normocephalic and atraumatic. PERRLA, EOMI. Oropharynx is clear. Neck is nontender and supple. Back is nontender. Lungs are clear without rales, wheezes, rhonchi. Heart has regular rate and rhythm without murmur. Abdomen is obese and there is a rash and a sense of underneath her pannus is erythematous with some excoriations his skin and some satellite lesions consistent with a monilial infection. There is no induration or fluctuance to suggest an underlying abscess. This is moderately tender. Extremities have 1+ edema, no cyanosis. There is full range of  motion present. Skin is otherwise warm and dry without rash. Neurologic: Mental status is normal, cranial nerves are intact, there no focal motor or sensory deficits.  ED Course  Procedures (including critical care time)  Results for orders placed during the hospital encounter of 04/01/12  GLUCOSE, CAPILLARY      Component Value Range   Glucose-Capillary 86  70 - 99 (mg/dL)      1. Candidal dermatitis       MDM  Because of unexplained Candida rash, CBG was obtained which did not show evidence of diabetes. It is a fairly large area and I do not think it would be amenable to purely topical treatments and she will be sent home with a prescription for fluconazole 100 mg daily for 2 weeks. She is to supplement this with topical anti-candidate treatment such as miconazole or clotrimazole. She's given a prescription for  Percocet for pain and is to followup with her PCP in one week.        Dione Booze, MD 04/01/12 [phone]

## 2012-04-01 NOTE — ED Notes (Signed)
Pt states she continues to have left arm pain since falling into a hole a year ago. States she has been seen ED and Cardiology for same. Pt had a cath a cple months ago 'that was fine'. Pain in left arm increases with movement.

## 2012-04-09 ENCOUNTER — Ambulatory Visit: Payer: Self-pay | Admitting: Family Medicine

## 2012-04-24 ENCOUNTER — Ambulatory Visit: Payer: Self-pay | Admitting: Family Medicine

## 2012-05-07 ENCOUNTER — Ambulatory Visit: Payer: Self-pay | Admitting: Family Medicine

## 2012-05-16 HISTORY — PX: NM MYOVIEW LTD: HXRAD82

## 2012-05-21 ENCOUNTER — Emergency Department (HOSPITAL_COMMUNITY)
Admission: EM | Admit: 2012-05-21 | Discharge: 2012-05-21 | Disposition: A | Discharge status: Home / Self Care | Payer: Self-pay | Attending: Emergency Medicine | Admitting: Emergency Medicine

## 2012-05-21 ENCOUNTER — Emergency Department (HOSPITAL_COMMUNITY): Payer: Self-pay

## 2012-05-21 ENCOUNTER — Encounter (HOSPITAL_COMMUNITY): Payer: Self-pay

## 2012-05-21 DIAGNOSIS — I252 Old myocardial infarction: Secondary | ICD-10-CM | POA: Insufficient documentation

## 2012-05-21 DIAGNOSIS — N390 Urinary tract infection, site not specified: Secondary | ICD-10-CM | POA: Insufficient documentation

## 2012-05-21 DIAGNOSIS — R0789 Other chest pain: Secondary | ICD-10-CM

## 2012-05-21 DIAGNOSIS — I10 Essential (primary) hypertension: Secondary | ICD-10-CM | POA: Insufficient documentation

## 2012-05-21 DIAGNOSIS — Z8739 Personal history of other diseases of the musculoskeletal system and connective tissue: Secondary | ICD-10-CM | POA: Insufficient documentation

## 2012-05-21 DIAGNOSIS — R0602 Shortness of breath: Secondary | ICD-10-CM | POA: Insufficient documentation

## 2012-05-21 DIAGNOSIS — Z79899 Other long term (current) drug therapy: Secondary | ICD-10-CM | POA: Insufficient documentation

## 2012-05-21 DIAGNOSIS — R071 Chest pain on breathing: Secondary | ICD-10-CM | POA: Insufficient documentation

## 2012-05-21 DIAGNOSIS — I2510 Atherosclerotic heart disease of native coronary artery without angina pectoris: Secondary | ICD-10-CM | POA: Insufficient documentation

## 2012-05-21 LAB — DIFFERENTIAL
Basophils Relative: 1 % (ref 0–1)
Eosinophils Absolute: 0.3 10*3/uL (ref 0.0–0.7)
Lymphs Abs: 2.5 10*3/uL (ref 0.7–4.0)
Monocytes Absolute: 0.6 10*3/uL (ref 0.1–1.0)
Monocytes Relative: 8 % (ref 3–12)

## 2012-05-21 LAB — URINALYSIS, ROUTINE W REFLEX MICROSCOPIC
Bilirubin Urine: NEGATIVE
Hgb urine dipstick: NEGATIVE
Ketones, ur: NEGATIVE mg/dL
Nitrite: POSITIVE — AB
pH: 6.5 (ref 5.0–8.0)

## 2012-05-21 LAB — POCT I-STAT TROPONIN I: Troponin i, poc: 0 ng/mL (ref 0.00–0.08)

## 2012-05-21 LAB — BASIC METABOLIC PANEL
BUN: 14 mg/dL (ref 6–23)
Chloride: 103 mEq/L (ref 96–112)
Creatinine, Ser: 0.71 mg/dL (ref 0.50–1.10)
GFR calc Af Amer: >90 mL/min (ref >90)
GFR calc non Af Amer: >90 mL/min (ref >90)
Glucose, Bld: 81 mg/dL (ref 70–99)

## 2012-05-21 LAB — URINE MICROSCOPIC-ADD ON

## 2012-05-21 LAB — CBC
HCT: 40.8 % (ref 36.0–46.0)
Hemoglobin: 13.4 g/dL (ref 12.0–15.0)
MCH: 28 pg (ref 26.0–34.0)
MCHC: 32.8 g/dL (ref 30.0–36.0)

## 2012-05-21 MED ORDER — CIPROFLOXACIN HCL 500 MG PO TABS: 500.0000 mg | ORAL_TABLET | Freq: Two times a day (BID) | ORAL | Status: AC

## 2012-05-21 MED ORDER — OXYCODONE-ACETAMINOPHEN 5-325 MG PO TABS: 1.0000 | ORAL_TABLET | Freq: Four times a day (QID) | ORAL | Status: AC | PRN

## 2012-05-21 MED ORDER — SODIUM CHLORIDE 0.9 % IV BOLUS (SEPSIS)
500.0000 mL | Freq: Once | INTRAVENOUS | Status: AC
  Administered 2012-05-21: 500 mL via INTRAVENOUS

## 2012-05-21 MED ORDER — OXYCODONE-ACETAMINOPHEN 5-325 MG PO TABS
1.0000 | ORAL_TABLET | Freq: Once | ORAL | Status: AC
  Administered 2012-05-21: 1 via ORAL
  Filled 2012-05-21: qty 1

## 2012-05-21 MED ORDER — CIPROFLOXACIN HCL 500 MG PO TABS
500.0000 mg | ORAL_TABLET | Freq: Once | ORAL | Status: AC
  Administered 2012-05-21: 500 mg via ORAL
  Filled 2012-05-21: qty 1

## 2012-05-21 MED ORDER — MORPHINE SULFATE 4 MG/ML IJ SOLN
4.0000 mg | Freq: Once | INTRAMUSCULAR | Status: AC
  Administered 2012-05-21: 4 mg via INTRAVENOUS
  Filled 2012-05-21: qty 1

## 2012-05-21 MED ORDER — ONDANSETRON HCL 4 MG/2ML IJ SOLN
4.0000 mg | Freq: Once | INTRAMUSCULAR | Status: AC
  Administered 2012-05-21: 4 mg via INTRAVENOUS
  Filled 2012-05-21: qty 2

## 2012-05-21 NOTE — ED Notes (Signed)
Per EMS pt c/o of chest pain. Sharp right side of chest and back. Worse when moving, laying and deep breath. Pain started last night. Fell 2 days ago. Lungs clear. Denies n/v. Previous hx of MI, COPD, HTN. Allergy to PCN.

## 2012-05-21 NOTE — Discharge Instructions (Signed)
Chest Wall Pain Chest wall pain is pain in or around the bones and muscles of your chest. It may take up to 6 weeks to get better. It may take longer if you must stay physically active in your work and activities.  CAUSES  Chest wall pain may happen on its own. However, it may be caused by:  A viral illness like the flu.   Injury.   Coughing.   Exercise.   Arthritis.   Fibromyalgia.   Shingles.  HOME CARE INSTRUCTIONS   Avoid overtiring physical activity. Try not to strain or perform activities that cause pain. This includes any activities using your chest or your abdominal and side muscles, especially if heavy weights are used.   Put ice on the sore area.   Put ice in a plastic bag.   Place a towel between your skin and the bag.   Leave the ice on for 15 to 20 minutes per hour while awake for the first 2 days.   Only take over-the-counter or prescription medicines for pain, discomfort, or fever as directed by your caregiver.  SEEK IMMEDIATE MEDICAL CARE IF:   Your pain increases, or you are very uncomfortable.   You have a fever.   Your chest pain becomes worse.   You have new, unexplained symptoms.   You have nausea or vomiting.   You feel sweaty or lightheaded.   You have a cough with phlegm (sputum), or you cough up blood.  MAKE SURE YOU:   Understand these instructions.   Will watch your condition.   Will get help right away if you are not doing well or get worse.  Document Released: 12/02/2005 Document Revised: 11/21/2011 Document Reviewed: 07/29/2011 [employer] Patient Information 2012 [employer]. 

## 2012-05-21 NOTE — ED Notes (Signed)
Attempted IV restart due to IV started by ems not flushing. Unsuccessful, will have another RN attempt.

## 2012-05-21 NOTE — ED Provider Notes (Signed)
History     CSN: [id number]  Arrival date & time 05/21/12  1730   First MD Initiated Contact with Patient 05/21/12 1746      Chief Complaint  Patient presents with  . Chest Pain    (Consider location/radiation/quality/duration/timing/severity/associated sxs/prior treatment) Patient is a 56 y.o. female presenting with chest pain. The history is provided by the patient.  Chest Pain The chest pain began 3 - 5 days ago. Duration of episode(s) is 3 days. Chest pain occurs constantly. The chest pain is worsening. The pain is associated with breathing and coughing. At its most intense, the pain is at 9/10. The pain is currently at 9/10. The severity of the pain is severe. The quality of the pain is described as pleuritic, sharp and stabbing. The pain radiates to the upper back. Chest pain is worsened by deep breathing. Primary symptoms include shortness of breath and cough. Pertinent negatives for primary symptoms include no fever, no syncope, no wheezing, no nausea and no vomiting.  The cough is non-productive.  Pertinent negatives for associated symptoms include no diaphoresis and no lower extremity edema. She tried NSAIDs for the symptoms.  Her past medical history is significant for hypertension and MI.  Procedure history is positive for cardiac catheterization.     Past Medical History  Diagnosis Date  . Myocardial infarction 1997  . Coronary artery disease   . Hypertension   . Arthritis   . Problem with literacy   . Shortness of breath   . Anxiety   . Bronchitis   . Chronic headaches     Past Surgical History  Procedure Date  . Ectopic pregnancy surgery     right    Family History  Problem Relation Age of Onset  . Cancer Mother 93    lung ca  . Diabetes Mother   . Anxiety disorder Sister   . Cancer Brother 60    lung    History  Substance Use Topics  . Smoking status: Former Smoker -- 1.0 packs/day for 3 years    Quit date: 12/03/2010  . Smokeless tobacco: Not  on file  . Alcohol Use: No    OB History    Grav Para Term Preterm Abortions TAB SAB Ect Mult Living                  Review of Systems  Constitutional: Negative for fever and diaphoresis.  Respiratory: Positive for cough and shortness of breath. Negative for wheezing.   Cardiovascular: Positive for chest pain. Negative for syncope.  Gastrointestinal: Negative for nausea and vomiting.  Genitourinary: Positive for dysuria.  All other systems reviewed and are negative.    Allergies  Penicillins  Home Medications   Current Outpatient Rx  Name Route Sig Dispense Refill  . ALBUTEROL SULFATE HFA 108 (90 BASE) MCG/ACT IN AERS Inhalation Inhale 2 puffs into the lungs every 6 (six) hours as needed. For wheezing or sob 1 Inhaler 5  . ASPIRIN EC 81 MG PO TBEC Oral Take 1 tablet (81 mg total) by mouth daily.  2  . ATORVASTATIN CALCIUM 10 MG PO TABS Oral Take 10 mg by mouth daily. MAP    . METOPROLOL SUCCINATE ER 50 MG PO TB24 Oral Take 50 mg by mouth daily. Take with or immediately following a meal.      BP 124/65  Pulse 58  Temp(Src) 98.7 F (37.1 C) (Oral)  Resp 12  SpO2 97%  Physical Exam  Nursing note and vitals reviewed.  Constitutional: She is oriented to person, place, and time. She appears well-developed and well-nourished. No distress.  HENT:  Head: Normocephalic and atraumatic.  Mouth/Throat: Oropharynx is clear and moist.  Eyes: Conjunctivae and EOM are normal. Pupils are equal, round, and reactive to light.  Neck: Normal range of motion. Neck supple.  Cardiovascular: Normal rate, regular rhythm and intact distal pulses.   No murmur heard. Pulmonary/Chest: Effort normal and breath sounds normal. No respiratory distress. She has no wheezes. She has no rales. She exhibits tenderness. She exhibits no crepitus, no deformity and no swelling.    Abdominal: Soft. She exhibits no distension. There is no tenderness. There is no rebound and no guarding.  Musculoskeletal:  Normal range of motion. She exhibits tenderness. She exhibits no edema.       Right knee: She exhibits no swelling and no effusion. tenderness found. Medial joint line and lateral joint line tenderness noted.       No calf tenderness and no distal edema in either leg currently  Neurological: She is alert and oriented to person, place, and time.  Skin: Skin is warm and dry. No rash noted. No erythema.  Psychiatric: She has a normal mood and affect. Her behavior is normal.    ED Course  Procedures (including critical care time)  Labs Reviewed  URINALYSIS, ROUTINE W REFLEX MICROSCOPIC - Abnormal; Notable for the following:    APPearance CLOUDY (*)    Nitrite POSITIVE (*)    Leukocytes, UA MODERATE (*)    All other components within normal limits  URINE MICROSCOPIC-ADD ON - Abnormal; Notable for the following:    Squamous Epithelial / LPF FEW (*)    Bacteria, UA MANY (*)    All other components within normal limits  CBC  DIFFERENTIAL  BASIC METABOLIC PANEL  D-DIMER, QUANTITATIVE  POCT I-STAT TROPONIN I   Dg Chest 2 View  05/21/2012  *RADIOLOGY REPORT*  Clinical Data: Chest pain.  CHEST - 2 VIEW  Comparison: Chest x-ray 02/05/2012.  Findings: Lung volumes are normal.  No consolidative airspace disease.  No pleural effusions.  No pneumothorax.  No pulmonary nodule or mass noted.  Pulmonary vasculature and the cardiomediastinal silhouette are within normal limits.  IMPRESSION: 1. No radiographic evidence of acute cardiopulmonary disease.  Original Report Authenticated By: Florencia Reasons, M.D.     Date: 05/21/2012  Rate: 58  Rhythm: normal sinus rhythm  QRS Axis: normal  Intervals: normal  ST/T Wave abnormalities: normal  Conduction Disutrbances: none  Narrative Interpretation: unremarkable      No diagnosis found.    MDM   Patient with right-sided chest pain that radiates into the back that started 2-3 days ago. This pain is not consistent with cardiac pain despite  patient having a history of an MI. Patient has been slightly short of breath but denies any radiation into her abdomen or down her arms suggestive of dissection. She's also been been complaining of right knee pain for 1-2 months. She has no prior history of PE she is not tachycardic so she is a low risk well's criteria. One set of cardiac enzymes were ordered feel that if these are normal low likelihood for cardiac or she's had persistent pain now for 3 days. Her EKG is within normal limits. CBC, BMP, UA, d-dimer, chest x-ray pending  9:22 PM Labs are within normal limits. Except for a UTI. Do not feel that this pain is a PE or cardiac related. Also patient states she fell down the  stairs and that's when the pain really started this is most likely all due to trauma.      Gwyneth Sprout, MD 05/21/12 2123

## 2012-05-21 NOTE — ED Notes (Signed)
[id number]<AV> Expected date:<BR> Expected time: 5:27 PM<BR> Means of arrival:<BR> Comments:<BR> M61 - 56yoF Chest wall pain, recent fall

## 2012-05-23 LAB — URINE CULTURE: Colony Count: >=100000

## 2012-05-24 NOTE — ED Notes (Signed)
+  Urine. Patient treated with Cipro. Sensitive to same. Per protocol MD. °

## 2012-06-02 ENCOUNTER — Emergency Department (HOSPITAL_COMMUNITY): Payer: Medicaid Other

## 2012-06-02 ENCOUNTER — Emergency Department (HOSPITAL_COMMUNITY)
Admission: EM | Admit: 2012-06-02 | Discharge: 2012-06-02 | Discharge status: Against Medical Advice | Payer: Medicaid Other | Attending: Emergency Medicine | Admitting: Emergency Medicine

## 2012-06-02 ENCOUNTER — Encounter: Payer: Self-pay | Admitting: Family Medicine

## 2012-06-02 ENCOUNTER — Ambulatory Visit (INDEPENDENT_AMBULATORY_CARE_PROVIDER_SITE_OTHER): Payer: Self-pay | Admitting: Family Medicine

## 2012-06-02 VITALS — BP 124/85 | HR 61 | Ht 68.0 in | Wt 228.0 lb

## 2012-06-02 DIAGNOSIS — F419 Anxiety disorder, unspecified: Secondary | ICD-10-CM

## 2012-06-02 DIAGNOSIS — M79609 Pain in unspecified limb: Secondary | ICD-10-CM | POA: Insufficient documentation

## 2012-06-02 DIAGNOSIS — E785 Hyperlipidemia, unspecified: Secondary | ICD-10-CM

## 2012-06-02 DIAGNOSIS — F411 Generalized anxiety disorder: Secondary | ICD-10-CM

## 2012-06-02 DIAGNOSIS — E039 Hypothyroidism, unspecified: Secondary | ICD-10-CM

## 2012-06-02 LAB — CBC
HCT: 39.8 % (ref 36.0–46.0)
Hemoglobin: 13.4 g/dL (ref 12.0–15.0)
MCH: 28.5 pg (ref 26.0–34.0)
MCHC: 33.7 g/dL (ref 30.0–36.0)
MCV: 84.7 fL (ref 78.0–100.0)
RBC: 4.7 MIL/uL (ref 3.87–5.11)

## 2012-06-02 LAB — BASIC METABOLIC PANEL
BUN: 14 mg/dL (ref 6–23)
CO2: 23 mEq/L (ref 19–32)
GFR calc non Af Amer: 77 mL/min — ABNORMAL LOW (ref >90)
Glucose, Bld: 94 mg/dL (ref 70–99)
Potassium: 3.9 mEq/L (ref 3.5–5.1)
Sodium: 138 mEq/L (ref 135–145)

## 2012-06-02 LAB — POCT I-STAT TROPONIN I: Troponin i, poc: 0 ng/mL (ref 0.00–0.08)

## 2012-06-02 NOTE — Assessment & Plan Note (Signed)
Asked her to restart paroxetien.

## 2012-06-02 NOTE — Assessment & Plan Note (Signed)
Will check LDL today.  

## 2012-06-02 NOTE — ED Notes (Signed)
No response from triage or waiting. 

## 2012-06-02 NOTE — Progress Notes (Signed)
  Subjective:    Patient ID: GRACELIN WEISBERG, female    DOB: 1955-12-18, 56 y.o.   MRN: [id number]  HPI Here for routine follow-up  Medication reconciliation performed today. She brings a bag full of empty pill bottle.  She does not know what most of these are for.  Over half were discarded today as they are not current prescriptions or the course was completed after prescription from the ER.  She does have some bottles with tablets left for her current medications of toprol, lipitor, asa, synthroid.  She states she takes synthroid daily.   She does not have paroexetin    Hypothyroidism: continues to feel fatigued. States is taking synthroid daily  Anxiety:  continues to struggle with sadness and anxiety.  Patient states she did not refill paroxetine although it helped because they pharmacy did not give it to her.  At the last appointment I gave her a paper rx to  Physically take to the pharmacy.  She does not recall what happened.  Hypertension:  Well cotnrolled today.  States had cardiology follow-up next month.   Review of Systems see HPI   Objective:   Physical Exam GEN: Alert & Oriented, No acute distress CV:  Regular Rate & Rhythm, no murmur Respiratory:  Normal work of breathing, CTAB Abd:  + BS, soft, no tenderness to palpation Ext: no pre-tibial edema        Assessment & Plan:

## 2012-06-02 NOTE — Patient Instructions (Addendum)
Ok to use acetaminophen 2-3 times per day   Get paroxetine from pharmacy.  Try a half tablet daily  For 1 week then go to a full tablet  Follow-up in 3 months or sooner if needed

## 2012-06-02 NOTE — ED Notes (Signed)
Pt presents to department for evaluation of bilateral knee/lower leg swelling, chest pain, and SOB. Ongoing x1 week. Pt states shortness of breath becomes worse with ambulation, some relief with inhaler at home. Also states intermittent diffuse chest pain. 2/10 at the time. Skin warm and dry. Ambulatory to triage. Respirations unlabored.

## 2012-06-02 NOTE — ED Notes (Signed)
No response from triage or waiting.

## 2012-06-03 ENCOUNTER — Encounter (HOSPITAL_COMMUNITY): Payer: Self-pay

## 2012-06-03 ENCOUNTER — Emergency Department (HOSPITAL_COMMUNITY): Payer: Medicaid Other

## 2012-06-03 ENCOUNTER — Emergency Department (HOSPITAL_COMMUNITY)
Admission: EM | Admit: 2012-06-03 | Discharge: 2012-06-03 | Disposition: A | Discharge status: Home / Self Care | Payer: Medicaid Other | Attending: Emergency Medicine | Admitting: Emergency Medicine

## 2012-06-03 DIAGNOSIS — Z880 Allergy status to penicillin: Secondary | ICD-10-CM | POA: Insufficient documentation

## 2012-06-03 DIAGNOSIS — F411 Generalized anxiety disorder: Secondary | ICD-10-CM | POA: Insufficient documentation

## 2012-06-03 DIAGNOSIS — I10 Essential (primary) hypertension: Secondary | ICD-10-CM | POA: Insufficient documentation

## 2012-06-03 DIAGNOSIS — M25569 Pain in unspecified knee: Secondary | ICD-10-CM | POA: Insufficient documentation

## 2012-06-03 DIAGNOSIS — M222X1 Patellofemoral disorders, right knee: Secondary | ICD-10-CM

## 2012-06-03 DIAGNOSIS — Z87891 Personal history of nicotine dependence: Secondary | ICD-10-CM | POA: Insufficient documentation

## 2012-06-03 DIAGNOSIS — I2510 Atherosclerotic heart disease of native coronary artery without angina pectoris: Secondary | ICD-10-CM | POA: Insufficient documentation

## 2012-06-03 DIAGNOSIS — M129 Arthropathy, unspecified: Secondary | ICD-10-CM | POA: Insufficient documentation

## 2012-06-03 DIAGNOSIS — I252 Old myocardial infarction: Secondary | ICD-10-CM | POA: Insufficient documentation

## 2012-06-03 LAB — TSH: TSH: 18.749 u[IU]/mL — ABNORMAL HIGH (ref 0.350–4.500)

## 2012-06-03 MED ORDER — TRAMADOL HCL 50 MG PO TABS: 50.0000 mg | ORAL_TABLET | Freq: Four times a day (QID) | ORAL | Status: DC | PRN

## 2012-06-03 MED ORDER — IBUPROFEN 400 MG PO TABS
800.0000 mg | ORAL_TABLET | Freq: Once | ORAL | Status: AC
  Administered 2012-06-03: 800 mg via ORAL
  Filled 2012-06-03: qty 2

## 2012-06-03 NOTE — ED Notes (Signed)
Shoulder leg and back pain, started "quite a while" ago, denies injury just onset of pain.

## 2012-06-03 NOTE — ED Provider Notes (Signed)
History   This chart was scribed for Gerhard Munch, MD by Shari Heritage. The patient was seen in room TR05C/TR05C. Patient's care was started at 1533.     CSN: [id number]  Arrival date & time 06/03/12  1533   First MD Initiated Contact with Patient 06/03/12 1743      Chief Complaint  Patient presents with  . Leg Pain    (Consider location/radiation/quality/duration/timing/severity/associated sxs/prior treatment) Patient is a 56 y.o. female presenting with knee pain. The history is provided by the patient. No language interpreter was used.  Knee Pain This is a recurrent problem. The current episode started more than 1 week ago. The problem has not changed since onset.Pertinent negatives include no chest pain, no abdominal pain, no headaches and no shortness of breath. Nothing relieves the symptoms. She has tried acetaminophen for the symptoms. The treatment provided no relief.   [name] is a 56 y.o. female who presents to the Emergency Department complaining of constant, moderate right knee pain that travels down to her ankle with associated swelling onset several weeks ago. Patient says that she has noticed some swelling in her left leg, but no pain. Patient says she has been taking nighttime pain relievers with no relief. Patient reports other symptoms of chills and tingling on the plantar surface of her right foot. Patient with medical h/o MI, CAD, HTN, arthritis, SOB, and anxiety. Patient is a former smoker (quit date:12/03/2010).  Past Medical History  Diagnosis Date  . Myocardial infarction 1997  . Coronary artery disease   . Hypertension   . Arthritis   . Problem with literacy   . Shortness of breath   . Anxiety   . Bronchitis   . Chronic headaches     Past Surgical History  Procedure Date  . Ectopic pregnancy surgery     right    Family History  Problem Relation Age of Onset  . Cancer Mother 17    lung ca  . Diabetes Mother   . Anxiety disorder Sister    . Cancer Brother 60    lung    History  Substance Use Topics  . Smoking status: Former Smoker -- 1.0 packs/day for 3 years    Quit date: 12/03/2010  . Smokeless tobacco: Not on file  . Alcohol Use: No    OB History    Grav Para Term Preterm Abortions TAB SAB Ect Mult Living                  Review of Systems  Constitutional:       Per HPI, otherwise negative  HENT:       Per HPI, otherwise negative  Eyes: Negative.   Respiratory: Negative for shortness of breath.        Per HPI, otherwise negative  Cardiovascular: Negative for chest pain.       Per HPI, otherwise negative  Gastrointestinal: Negative for vomiting and abdominal pain.  Genitourinary: Negative.   Musculoskeletal:       Per HPI, otherwise negative  Skin: Negative.   Neurological: Negative for syncope and headaches.    Allergies  Penicillins  Home Medications   Current Outpatient Rx  Name Route Sig Dispense Refill  . ALBUTEROL SULFATE HFA 108 (90 BASE) MCG/ACT IN AERS Inhalation Inhale 2 puffs into the lungs every 6 (six) hours as needed. For wheezing or shortness of breath    . ASPIRIN EC 81 MG PO TBEC Oral Take 1 tablet (81 mg total) by  mouth daily.  2  . ATORVASTATIN CALCIUM 10 MG PO TABS Oral Take 10 mg by mouth daily. MAP    . DM-GUAIFENESIN ER 60-1200 MG PO TB12 Oral Take 1 tablet by mouth daily.    Marland Kitchen DIPHENHYDRAMINE-APAP (SLEEP) 25-500 MG PO TABS Oral Take 1 tablet by mouth at bedtime as needed. Pain / sleep    . LEVOTHYROXINE SODIUM 125 MCG PO TABS Oral Take 125 mcg by mouth daily.    Marland Kitchen MAGNESIUM SALICYLATE TETRAHYD 580 MG PO TABS Oral Take 1 tablet by mouth daily as needed. For back pain    . METOPROLOL SUCCINATE ER 50 MG PO TB24 Oral Take 50 mg by mouth daily. Take with or immediately following a meal.      BP 95/59  Pulse 62  Temp 98.1 F (36.7 C)  Resp 18  SpO2 96%  Physical Exam  Nursing note and vitals reviewed. Constitutional: She is oriented to person, place, and time. She  appears well-developed and well-nourished. No distress.  HENT:  Head: Normocephalic and atraumatic.  Eyes: Conjunctivae and EOM are normal.  Cardiovascular: Normal rate and regular rhythm.   Pulmonary/Chest: Effort normal and breath sounds normal. No stridor. No respiratory distress.  Abdominal: She exhibits no distension.  Musculoskeletal: She exhibits no edema and no tenderness.       Right knee - No patellar tenderness. No popliteal tenderness. Extension to 170 degrees. Flexion to 110 degrees. Knee is neither warm nor erythematous.  Neurological: She is alert and oriented to person, place, and time. No cranial nerve deficit.  Skin: Skin is warm and dry.  Psychiatric: She has a normal mood and affect.    ED Course  Procedures (including critical care time) DIAGNOSTIC STUDIES: Oxygen Saturation is 96% on room air, normal by my interpretation.    COORDINATION OF CARE: 5:55PM- Patient informed of current plan for treatment and evaluation and agrees with plan at this time. Will order an X-ray. Have ordered Ibuprofen and an ice pack for application areas of pain.   Labs Reviewed - No data to display  Dg Knee Complete 4 Views Right  06/03/2012  *RADIOLOGY REPORT*  Clinical Data: leg pain  RIGHT KNEE - COMPLETE 4+ VIEW  Comparison: None.  Findings: There is no joint effusion.  There is no fracture or subluxation.  No radio-opaque foreign body or soft tissue calcification.  IMPRESSION:  1.  No acute findings.  Original Report Authenticated By: Rosealee Albee, M.D.     No diagnosis found.    MDM  I personally performed the services described in this documentation, which was scribed in my presence. The recorded information has been reviewed and considered.  This obese elderly appearing female presents with ongoing pain in her right knee.  Notably, the patient has previously been diagnosed with patellofemoral pain syndrome, though she does not acknowledge this entity.  On exam the  patient is in no distress, though she has pain in her knee and restricted range of motion.  There is no appreciable effusion.  The patient is neurovascularly intact.  Following review of patient's chart, her x-ray today, she was discharged in stable condition with explicit instructions to follow up with orthopedics and her primary care physician.  Gerhard Munch, MD 06/04/12 [phone]

## 2012-06-03 NOTE — Discharge Instructions (Signed)
It is very important that you follow-up with both your physician and your orthopedist to continue the evaluation of your pain.

## 2012-06-03 NOTE — ED Notes (Signed)
sts did trip and fall down steps recently

## 2012-06-04 MED ORDER — LEVOTHYROXINE SODIUM 137 MCG PO TABS: 125.0000 ug | ORAL_TABLET | Freq: Every day | ORAL | Status: DC

## 2012-06-04 NOTE — Assessment & Plan Note (Signed)
tsh improved by still very high.  Compliance has been an issue in the past but she states she religiously takes it daily.  Will increase and repeat tsh in 6-8 weeks

## 2012-06-04 NOTE — Addendum Note (Signed)
Addended by: Macy Mis on: 06/04/2012 04:13 PM   Modules accepted: Orders

## 2012-06-08 ENCOUNTER — Ambulatory Visit (INDEPENDENT_AMBULATORY_CARE_PROVIDER_SITE_OTHER): Payer: Self-pay | Admitting: Family Medicine

## 2012-06-08 ENCOUNTER — Encounter: Payer: Self-pay | Admitting: Family Medicine

## 2012-06-08 VITALS — BP 109/77 | HR 63 | Ht 68.0 in | Wt 228.0 lb

## 2012-06-08 DIAGNOSIS — M222X1 Patellofemoral disorders, right knee: Secondary | ICD-10-CM

## 2012-06-08 DIAGNOSIS — M259 Joint disorder, unspecified: Secondary | ICD-10-CM

## 2012-06-08 NOTE — Progress Notes (Signed)
Patient ID: [name], female   DOB: [birth year], 56 y.o.   MRN: [id number]  HPI   Right knee pain for several months, worsening over the past 2 weeks.  Pain now radiates down front of her leg to her ankle.  No new injury. Worse with going up and down stairs, worse with a lot of standing or walking. Pain is primarily in the anterior part of the knee. Sharp sometimes aching-- more achy at night time when trying to rest.  Keeping her from sleeping. No giving way of the leg. No numbness in the foot or calf. No calf pain. + popping in knee, no locking.  Doing VMO exercises 2-3x/week. Doesn't seem to have made any difference.  Using ice-- not helping, still hurts; uses ice 2-3x/day for 15-20 min at a time.  Tylenol doesn't seem to help.     Left shoulder: pain in posterior shoulder, radiates down arm to hand.  Pain has been present for a year after tripping in a hole while walking.  Sharp pains in hand, has to shake it to make it feel better. + tingling on outside of left hand/pinky finger.  Worse at night- usually sleeps on right side at night, but has been sleeping on left side more recently b/c of knee pain.  Occasional sensation of weakness in left arm.   PERTINENT  PMH / PSH: No hx knee surgery Hx low literacy for health information  Vital signs reviewed. GENERAL: Well developed, well nourished, no acute distress. Obese. KNEE: right. FROM in flex and extension. Mild patellar crepitus. Slight lateral tracking patella B. No knee effusion. Immensely intact to varus and valgus stress. Negative McMurray. Normal Lachman. Distally intact vascular and sensory status. IMAGING: Reviewed for review of knee. No significant DJD.  INJECTION: Patient was given informed consent, signed copy in the chart. Appropriate time out was taken. Area prepped and draped in usual sterile fashion. 1 cc of methylprednisolone 40 mg/ml plus  4 cc of 1% lidocaine without epinephrine was injected into the right knee  using  a(n) anterior approach. The patient tolerated the procedure well. There were no complications. Post procedure instructions were given.

## 2012-06-09 NOTE — Assessment & Plan Note (Signed)
Exam and history consistent with patellofemoral syndrome. Has not really been doing her exercises regularly, that is she doesn't 2-3 times a week at most. We discussed at length today. We offered several options including corticosteroid injection although as I explained to her am not sure this will improve her pain. Given her crepitus it is likely that she has some component of DJD in the patellofemoral compartment and this may be contributing to her pain which I think is keeping her from doing the exercises. Ultimately we decided to do corticosteroid injection, focus on the exercises and return to clinic in about 3-4 weeks. At that time we can further address her shoulder.

## 2012-06-12 ENCOUNTER — Emergency Department (HOSPITAL_COMMUNITY): Payer: Medicaid Other

## 2012-06-12 ENCOUNTER — Inpatient Hospital Stay (HOSPITAL_COMMUNITY)
Admission: EM | Admit: 2012-06-12 | Discharge: 2012-06-15 | DRG: 313 | Disposition: A | Discharge status: Home / Self Care | Payer: Medicaid Other | Attending: Cardiology | Admitting: Cardiology

## 2012-06-12 DIAGNOSIS — E7800 Pure hypercholesterolemia, unspecified: Secondary | ICD-10-CM | POA: Diagnosis present

## 2012-06-12 DIAGNOSIS — R0602 Shortness of breath: Secondary | ICD-10-CM | POA: Diagnosis present

## 2012-06-12 DIAGNOSIS — Z7982 Long term (current) use of aspirin: Secondary | ICD-10-CM

## 2012-06-12 DIAGNOSIS — F411 Generalized anxiety disorder: Secondary | ICD-10-CM | POA: Diagnosis present

## 2012-06-12 DIAGNOSIS — I252 Old myocardial infarction: Secondary | ICD-10-CM

## 2012-06-12 DIAGNOSIS — I472 Ventricular tachycardia, unspecified: Secondary | ICD-10-CM | POA: Diagnosis present

## 2012-06-12 DIAGNOSIS — Z79899 Other long term (current) drug therapy: Secondary | ICD-10-CM

## 2012-06-12 DIAGNOSIS — M129 Arthropathy, unspecified: Secondary | ICD-10-CM | POA: Diagnosis present

## 2012-06-12 DIAGNOSIS — I4729 Other ventricular tachycardia: Secondary | ICD-10-CM | POA: Diagnosis present

## 2012-06-12 DIAGNOSIS — R0789 Other chest pain: Principal | ICD-10-CM | POA: Diagnosis present

## 2012-06-12 DIAGNOSIS — I498 Other specified cardiac arrhythmias: Secondary | ICD-10-CM | POA: Diagnosis present

## 2012-06-12 DIAGNOSIS — R079 Chest pain, unspecified: Secondary | ICD-10-CM

## 2012-06-12 DIAGNOSIS — I2510 Atherosclerotic heart disease of native coronary artery without angina pectoris: Secondary | ICD-10-CM | POA: Diagnosis present

## 2012-06-12 DIAGNOSIS — E669 Obesity, unspecified: Secondary | ICD-10-CM

## 2012-06-12 DIAGNOSIS — I10 Essential (primary) hypertension: Secondary | ICD-10-CM | POA: Diagnosis present

## 2012-06-12 LAB — COMPREHENSIVE METABOLIC PANEL
Albumin: 3.7 g/dL (ref 3.5–5.2)
Alkaline Phosphatase: 78 U/L (ref 39–117)
BUN: 19 mg/dL (ref 6–23)
Creatinine, Ser: 0.85 mg/dL (ref 0.50–1.10)
GFR calc Af Amer: 88 mL/min — ABNORMAL LOW (ref >90)
Glucose, Bld: 108 mg/dL — ABNORMAL HIGH (ref 70–99)
Potassium: 4 mEq/L (ref 3.5–5.1)
Total Bilirubin: 0.5 mg/dL (ref 0.3–1.2)
Total Protein: 6.5 g/dL (ref 6.0–8.3)

## 2012-06-12 LAB — LIPASE, BLOOD: Lipase: 76 U/L — ABNORMAL HIGH (ref 11–59)

## 2012-06-12 LAB — CBC WITH DIFFERENTIAL/PLATELET
Basophils Relative: 1 % (ref 0–1)
Eosinophils Absolute: 0.2 10*3/uL (ref 0.0–0.7)
HCT: 38.9 % (ref 36.0–46.0)
Hemoglobin: 12.8 g/dL (ref 12.0–15.0)
Lymphs Abs: 2.6 10*3/uL (ref 0.7–4.0)
MCH: 27.9 pg (ref 26.0–34.0)
MCHC: 32.9 g/dL (ref 30.0–36.0)
MCV: 84.9 fL (ref 78.0–100.0)
Monocytes Absolute: 0.5 10*3/uL (ref 0.1–1.0)
Monocytes Relative: 7 % (ref 3–12)
Neutrophils Relative %: 56 % (ref 43–77)
RBC: 4.58 MIL/uL (ref 3.87–5.11)

## 2012-06-12 LAB — POCT I-STAT TROPONIN I

## 2012-06-12 LAB — D-DIMER, QUANTITATIVE: D-Dimer, Quant: 0.22 ug/mL-FEU (ref 0.00–0.48)

## 2012-06-12 MED ORDER — ONDANSETRON HCL 4 MG/2ML IJ SOLN
4.0000 mg | Freq: Once | INTRAMUSCULAR | Status: AC
  Administered 2012-06-12: 4 mg via INTRAVENOUS
  Filled 2012-06-12: qty 2

## 2012-06-12 MED ORDER — HEPARIN BOLUS VIA INFUSION
4000.0000 [IU] | Freq: Once | INTRAVENOUS | Status: AC
  Administered 2012-06-13: 4000 [IU] via INTRAVENOUS

## 2012-06-12 MED ORDER — HEPARIN (PORCINE) IN NACL 100-0.45 UNIT/ML-% IJ SOLN
1200.0000 [IU]/h | INTRAMUSCULAR | Status: DC
  Administered 2012-06-13 (×2): 1200 [IU]/h via INTRAVENOUS
  Filled 2012-06-12 (×4): qty 250

## 2012-06-12 MED ORDER — SODIUM CHLORIDE 0.9 % IV BOLUS (SEPSIS)
1000.0000 mL | Freq: Once | INTRAVENOUS | Status: AC
  Administered 2012-06-12: 1000 mL via INTRAVENOUS

## 2012-06-12 MED ORDER — NITROGLYCERIN IN D5W 200-5 MCG/ML-% IV SOLN
5.0000 ug/min | INTRAVENOUS | Status: DC
  Administered 2012-06-13: 5 ug/min via INTRAVENOUS
  Filled 2012-06-12: qty 250

## 2012-06-12 MED ORDER — MORPHINE SULFATE 4 MG/ML IJ SOLN
4.0000 mg | Freq: Once | INTRAMUSCULAR | Status: AC
  Administered 2012-06-12: 4 mg via INTRAVENOUS
  Filled 2012-06-12: qty 1

## 2012-06-12 NOTE — Progress Notes (Signed)
ANTICOAGULATION CONSULT NOTE - Initial Consult  Pharmacy Consult for Heparin Indication: chest pain/ACS  Allergies  Allergen Reactions  . Penicillins Rash    Patient Measurements:   6/24 Wt=103kg, Ht=173 cm Heparin Dosing Weight: 88 kg  Vital Signs: Temp: 97.9 F (36.6 C) (06/28 1823) Temp src: Oral (06/28 1823) BP: 112/72 mmHg (06/28 2016) Pulse Rate: 60  (06/28 2016)  Labs:  Alvira Philips 06/12/12 2013  HGB 12.8  HCT 38.9  PLT 246  APTT --  LABPROT --  INR --  HEPARINUNFRC --  CREATININE 0.85  CKTOTAL --  CKMB --  TROPONINI --    The CrCl is unknown because both a height and weight (above a minimum accepted value) are required for this calculation.   Medical History: Past Medical History  Diagnosis Date  . Myocardial infarction 1997  . Coronary artery disease   . Hypertension   . Arthritis   . Problem with literacy   . Shortness of breath   . Anxiety   . Bronchitis   . Chronic headaches     Assessment: 56 yo F admitted with CP, to begin heparin for r/o ACS.  Patient reports no recent bleeding and is not on any blood thinners.  Goal of Therapy:  Heparin level 0.3-0.7 units/ml Monitor platelets by anticoagulation protocol: Yes   Plan:  1.  Heparin 4000 units IV x 1 2.  Begin heparin gtt at 1200 units/hr 3.  Check heparin level 6 hours after beginning, and baseline INR 4.  Daily heparin level, CBC  Rolland Porter, Pharm.D., BCPS Clinical Pharmacist Pager: [phone] 06/12/2012,10:20 PM

## 2012-06-12 NOTE — ED Notes (Signed)
Patient C/O having trouble breathing and pain in her chest. Onset was last night. Onset of shortness of breath was first, Chest pain ensued a little later. Along with the chest pain, patient had pain in her left arm and lower back. The pain continues to be present at this time. Edema of Bilateral lower extremities noted, right greater than left.

## 2012-06-12 NOTE — ED Notes (Signed)
Patient having runs of V-Tach. MD notified.

## 2012-06-12 NOTE — ED Provider Notes (Signed)
Medical screening examination/treatment/procedure(s) were conducted as a shared visit with non-physician practitioner(s) and myself.  I personally evaluated the patient during the encounter  Pt with known history of CAD.  Presents with recurrent chest pain and intermittent wide complex tachycardia.  Asymptomatic now.  Will admit for further evaluation.  Celene Kras, MD 06/12/12 [phone]

## 2012-06-12 NOTE — ED Provider Notes (Signed)
History     CSN: [id number]  Arrival date & time 06/12/12  1816   First MD Initiated Contact with Patient 06/12/12 1817      7:12 PM HPI Reports chest pain that began yesterday. Describes pain as a chest pressure. States pain radiates to left neck and left arm. Reports pain is worse with deep breaths. Patient has a significant history of a myocardial infarction, coronary artery disease, hypertension, shortness of breath, recent visit to emergency room for leg pain. Patient denies recent road trips, hormone therapy, smoking, history of blood clots or recent surgery Patient is a 56 y.o. female presenting with chest pain. The history is provided by the patient.  Chest Pain The chest pain began yesterday. Primary symptoms include palpitations. Pertinent negatives for primary symptoms include no fever, no fatigue, no shortness of breath, no cough, no wheezing, no abdominal pain, no nausea, no vomiting and no dizziness.  The palpitations did not occur with dizziness or shortness of breath.  Pertinent negatives for associated symptoms include no diaphoresis, no numbness and no weakness.     Past Medical History  Diagnosis Date  . Myocardial infarction 1997  . Coronary artery disease   . Hypertension   . Arthritis   . Problem with literacy   . Shortness of breath   . Anxiety   . Bronchitis   . Chronic headaches     Past Surgical History  Procedure Date  . Ectopic pregnancy surgery     right    Family History  Problem Relation Age of Onset  . Cancer Mother 71    lung ca  . Diabetes Mother   . Anxiety disorder Sister   . Cancer Brother 60    lung    History  Substance Use Topics  . Smoking status: Former Smoker -- 1.0 packs/day for 3 years    Quit date: 12/03/2010  . Smokeless tobacco: Not on file  . Alcohol Use: No    OB History    Grav Para Term Preterm Abortions TAB SAB Ect Mult Living                  Review of Systems  Constitutional: Negative for fever,  diaphoresis and fatigue.  HENT: Positive for neck pain.   Respiratory: Negative for cough, shortness of breath and wheezing.   Cardiovascular: Positive for chest pain and palpitations. Negative for leg swelling.  Gastrointestinal: Negative for nausea, vomiting and abdominal pain.  Neurological: Negative for dizziness, weakness, numbness and headaches.  All other systems reviewed and are negative.    Allergies  Penicillins  Home Medications   Current Outpatient Rx  Name Route Sig Dispense Refill  . ALBUTEROL SULFATE HFA 108 (90 BASE) MCG/ACT IN AERS Inhalation Inhale 2 puffs into the lungs every 6 (six) hours as needed. For wheezing or shortness of breath    . ASPIRIN EC 81 MG PO TBEC Oral Take 81 mg by mouth every morning.   2  . ATORVASTATIN CALCIUM 10 MG PO TABS Oral Take 10 mg by mouth every morning.     Marland Kitchen DIPHENHYDRAMINE-APAP (SLEEP) 25-500 MG PO TABS Oral Take 1 tablet by mouth at bedtime as needed. For back & arm pain    . METOPROLOL SUCCINATE ER 50 MG PO TB24 Oral Take 50 mg by mouth daily. Take with or immediately following a meal.      BP 103/56  Pulse 73  Temp 97.9 F (36.6 C) (Oral)  Resp 18  SpO2 96%  Physical Exam  Vitals reviewed. Constitutional: She is oriented to person, place, and time. Vital signs are normal. She appears well-developed and well-nourished.  HENT:  Head: Normocephalic and atraumatic.  Eyes: Conjunctivae are normal. Pupils are equal, round, and reactive to light.  Neck: Normal range of motion. Neck supple.  Cardiovascular: Normal rate, regular rhythm and normal heart sounds.  Exam reveals no friction rub.   No murmur heard. Pulmonary/Chest: Effort normal and breath sounds normal. She has no wheezes. She has no rhonchi. She has no rales. She exhibits no tenderness.  Musculoskeletal: Normal range of motion.  Neurological: She is alert and oriented to person, place, and time. Coordination normal.  Skin: Skin is warm and dry. No rash noted. No  erythema. No pallor.    ED Course  Procedures  Results for orders placed during the hospital encounter of 06/12/12  CBC WITH DIFFERENTIAL      Component Value Range   WBC 7.6  4.0 - 10.5 K/uL   RBC 4.58  3.87 - 5.11 MIL/uL   Hemoglobin 12.8  12.0 - 15.0 g/dL   HCT 11.9  14.7 - 82.9 %   MCV 84.9  78.0 - 100.0 fL   MCH 27.9  26.0 - 34.0 pg   MCHC 32.9  30.0 - 36.0 g/dL   RDW 56.2  13.0 - 86.5 %   Platelets 246  150 - 400 K/uL   Neutrophils Relative 56  43 - 77 %   Neutro Abs 4.2  1.7 - 7.7 K/uL   Lymphocytes Relative 34  12 - 46 %   Lymphs Abs 2.6  0.7 - 4.0 K/uL   Monocytes Relative 7  3 - 12 %   Monocytes Absolute 0.5  0.1 - 1.0 K/uL   Eosinophils Relative 3  0 - 5 %   Eosinophils Absolute 0.2  0.0 - 0.7 K/uL   Basophils Relative 1  0 - 1 %   Basophils Absolute 0.0  0.0 - 0.1 K/uL  COMPREHENSIVE METABOLIC PANEL      Component Value Range   Sodium 138  135 - 145 mEq/L   Potassium 4.0  3.5 - 5.1 mEq/L   Chloride 104  96 - 112 mEq/L   CO2 24  19 - 32 mEq/L   Glucose, Bld 108 (*) 70 - 99 mg/dL   BUN 19  6 - 23 mg/dL   Creatinine, Ser 7.84  0.50 - 1.10 mg/dL   Calcium 8.9  8.4 - 69.6 mg/dL   Total Protein 6.5  6.0 - 8.3 g/dL   Albumin 3.7  3.5 - 5.2 g/dL   AST 14  0 - 37 U/L   ALT 10  0 - 35 U/L   Alkaline Phosphatase 78  39 - 117 U/L   Total Bilirubin 0.5  0.3 - 1.2 mg/dL   GFR calc non Af Amer 76 (*) >90 mL/min   GFR calc Af Amer 88 (*) >90 mL/min  LIPASE, BLOOD      Component Value Range   Lipase 76 (*) 11 - 59 U/L  D-DIMER, QUANTITATIVE      Component Value Range   D-Dimer, Quant 0.22  0.00 - 0.48 ug/mL-FEU  POCT I-STAT TROPONIN I      Component Value Range   Troponin i, poc 0.00  0.00 - 0.08 ng/mL   Comment 3            Dg Chest 2 View  06/12/2012  *RADIOLOGY REPORT*  Clinical Data: Left-sided chest pain.  Shortness of breath.  Cough and congestion.  CHEST - 2 VIEW  Comparison: Multiple priors, most recently 06/02/2012.  Findings: Lung volumes are slightly  low.  No consolidative airspace disease.  No pleural effusions.  No pneumothorax.  Pulmonary vasculature and the cardiomediastinal silhouette are within normal limits.  IMPRESSION: 1.  Slightly low lung volumes without radiographic evidence of acute cardiopulmonary disease.  Original Report Authenticated By: Florencia Reasons, M.D.    ED ECG REPORT   Date: 06/12/2012  EKG Time: 10:27 PM  Rate: 75  Rhythm: normal sinus rhythm, PVCs  Axis: normal  Intervals:none  ST&T Change: nonspecific T wave changes  Narrative Interpretation: no signifcant changes since May 21 2012.               MDM   Spoke with Dr. Algie Coffer concerning chest pain. Recommended admission to 2 history of coronary artery disease and myocardial infarction. Also later during the patient's visit in emergency department she developed a wide-complex tachycardia. Dr. Algie Coffer requested heparin and nitroglycerin drips be started. Patient discussed with Dr Lynelle Doctor.       Thomasene Lot, PA-C 06/12/12 2232

## 2012-06-12 NOTE — H&P (Signed)
[name] is an 56 y.o. female.   Chief Complaint: Chest pain/palpitations HPI: Patient is 56 year old female with past medical history significant for coronary artery disease history of myocardial infarction in 1996 hypertension hypercholesteremia morbid obesity history of tobacco abuse anxiety disorder came to the ER complaining off retrosternal chest pain described as heaviness radiating to left shoulder and left arm since last night off and on associated with nausea and palpitation states chest pain was grade 10 over 10 partially relieved with sublingual nitroglycerin and morphine sulfate patient also complains of occasional palpitation off and on her was noted to have nonsustained VT and frequent episodes of her nonsustained ventricular bigeminy on the monitor. EKG otherwise showed normal sinus rhythm with nonspecific T wave changes. Patient was admitted with similar chest pain in October of 2012 when she had nuclear stress test which was negative for ischemia. Patient denies any cough fever or chills. Patient also gives history of exertional dyspnea with minimal exertion. Denies history of PND orthopnea or leg swelling.  Past Medical History  Diagnosis Date  . Myocardial infarction 1997  . Coronary artery disease   . Hypertension   . Arthritis   . Problem with literacy   . Shortness of breath   . Anxiety   . Bronchitis   . Chronic headaches     Past Surgical History  Procedure Date  . Ectopic pregnancy surgery     right    Family History  Problem Relation Age of Onset  . Cancer Mother 25    lung ca  . Diabetes Mother   . Anxiety disorder Sister   . Cancer Brother 33    lung   Social History:  reports that she quit smoking about 18 months ago. She does not have any smokeless tobacco history on file. She reports that she does not drink alcohol or use illicit drugs.  Allergies:  Allergies  Allergen Reactions  . Penicillins Rash     (Not in a hospital  admission)  Results for orders placed during the hospital encounter of 06/12/12 (from the past 48 hour(s))  CBC WITH DIFFERENTIAL     Status: Normal   Collection Time   06/12/12  8:13 PM      Component Value Range Comment   WBC 7.6  4.0 - 10.5 K/uL    RBC 4.58  3.87 - 5.11 MIL/uL    Hemoglobin 12.8  12.0 - 15.0 g/dL    HCT 47.8  29.5 - 62.1 %    MCV 84.9  78.0 - 100.0 fL    MCH 27.9  26.0 - 34.0 pg    MCHC 32.9  30.0 - 36.0 g/dL    RDW 30.8  65.7 - 84.6 %    Platelets 246  150 - 400 K/uL    Neutrophils Relative 56  43 - 77 %    Neutro Abs 4.2  1.7 - 7.7 K/uL    Lymphocytes Relative 34  12 - 46 %    Lymphs Abs 2.6  0.7 - 4.0 K/uL    Monocytes Relative 7  3 - 12 %    Monocytes Absolute 0.5  0.1 - 1.0 K/uL    Eosinophils Relative 3  0 - 5 %    Eosinophils Absolute 0.2  0.0 - 0.7 K/uL    Basophils Relative 1  0 - 1 %    Basophils Absolute 0.0  0.0 - 0.1 K/uL   COMPREHENSIVE METABOLIC PANEL     Status: Abnormal  Collection Time   06/12/12  8:13 PM      Component Value Range Comment   Sodium 138  135 - 145 mEq/L    Potassium 4.0  3.5 - 5.1 mEq/L    Chloride 104  96 - 112 mEq/L    CO2 24  19 - 32 mEq/L    Glucose, Bld 108 (*) 70 - 99 mg/dL    BUN 19  6 - 23 mg/dL    Creatinine, Ser 4.09  0.50 - 1.10 mg/dL    Calcium 8.9  8.4 - 81.1 mg/dL    Total Protein 6.5  6.0 - 8.3 g/dL    Albumin 3.7  3.5 - 5.2 g/dL    AST 14  0 - 37 U/L    ALT 10  0 - 35 U/L    Alkaline Phosphatase 78  39 - 117 U/L    Total Bilirubin 0.5  0.3 - 1.2 mg/dL    GFR calc non Af Amer 76 (*) >90 mL/min    GFR calc Af Amer 88 (*) >90 mL/min   LIPASE, BLOOD     Status: Abnormal   Collection Time   06/12/12  8:13 PM      Component Value Range Comment   Lipase 76 (*) 11 - 59 U/L   D-DIMER, QUANTITATIVE     Status: Normal   Collection Time   06/12/12  8:13 PM      Component Value Range Comment   D-Dimer, Quant 0.22  0.00 - 0.48 ug/mL-FEU   POCT I-STAT TROPONIN I     Status: Normal   Collection Time   06/12/12   8:28 PM      Component Value Range Comment   Troponin i, poc 0.00  0.00 - 0.08 ng/mL    Comment 3             Dg Chest 2 View  06/12/2012  *RADIOLOGY REPORT*  Clinical Data: Left-sided chest pain.  Shortness of breath.  Cough and congestion.  CHEST - 2 VIEW  Comparison: Multiple priors, most recently 06/02/2012.  Findings: Lung volumes are slightly low.  No consolidative airspace disease.  No pleural effusions.  No pneumothorax.  Pulmonary vasculature and the cardiomediastinal silhouette are within normal limits.  IMPRESSION: 1.  Slightly low lung volumes without radiographic evidence of acute cardiopulmonary disease.  Original Report Authenticated By: Florencia Reasons, M.D.    Review of Systems  Constitutional: Negative for fever and chills.  Eyes: Negative for blurred vision and double vision.  Respiratory: Positive for shortness of breath. Negative for cough, hemoptysis and sputum production.   Cardiovascular: Positive for chest pain, palpitations and orthopnea. Negative for claudication, leg swelling and PND.  Gastrointestinal: Positive for nausea. Negative for vomiting, abdominal pain and diarrhea.  Neurological: Negative for dizziness and headaches.    Blood pressure 130/86, pulse 59, temperature 97.9 F (36.6 C), temperature source Oral, resp. rate 18, SpO2 97.00%. Physical Exam  Constitutional: She is oriented to person, place, and time. She appears well-developed and well-nourished.  HENT:  Head: Normocephalic and atraumatic.  Mouth/Throat: No oropharyngeal exudate.  Eyes: Conjunctivae and EOM are normal. Pupils are equal, round, and reactive to light. No scleral icterus.  Neck: Normal range of motion. Neck supple. No JVD present. No tracheal deviation present. No thyromegaly present.  Cardiovascular: Normal rate, regular rhythm and normal heart sounds.  Exam reveals no gallop and no friction rub.   No murmur heard. Respiratory: Effort normal and breath sounds normal. No  respiratory distress. She has no wheezes. She has no rales. She exhibits no tenderness.  GI: Soft. Bowel sounds are normal. She exhibits no distension. There is no tenderness. There is no rebound.  Musculoskeletal: She exhibits no edema and no tenderness.  Lymphadenopathy:    She has no cervical adenopathy.  Neurological: She is alert and oriented to person, place, and time.     Assessment/Plan Chest pain rule out MI Status post nonsustained wide-complex tachycardia Coronary artery disease history of MI in the past Hypertension Hypercholesteremia Morbid obesity History of tobacco abuse Anxiety disorder History of bronchial asthma Plan As per orders Check old records  schedule for nuclear stress test  Robynn Pane 06/12/2012, 10:50 PM

## 2012-06-12 NOTE — ED Notes (Signed)
EMS-pt reports generalized chest heaviness since last night radiating to left arm and back with no associated symptoms. Pt reports pain increases with palpation and inspiration. 22g(L)hand. Pt given 324 ASA. Pt reports no relief after 1 nitro vitals stable.

## 2012-06-13 ENCOUNTER — Inpatient Hospital Stay (HOSPITAL_COMMUNITY): Payer: Medicaid Other

## 2012-06-13 ENCOUNTER — Encounter (HOSPITAL_COMMUNITY): Payer: Self-pay

## 2012-06-13 LAB — CBC
Hemoglobin: 12.1 g/dL (ref 12.0–15.0)
MCH: 28.8 pg (ref 26.0–34.0)
MCHC: 33.3 g/dL (ref 30.0–36.0)
Platelets: 251 10*3/uL (ref 150–400)
RDW: 14.8 % (ref 11.5–15.5)

## 2012-06-13 LAB — COMPREHENSIVE METABOLIC PANEL
ALT: 9 U/L (ref 0–35)
AST: 17 U/L (ref 0–37)
CO2: 25 mEq/L (ref 19–32)
Calcium: 8.7 mg/dL (ref 8.4–10.5)
Chloride: 107 mEq/L (ref 96–112)
Creatinine, Ser: 0.92 mg/dL (ref 0.50–1.10)
GFR calc Af Amer: 80 mL/min — ABNORMAL LOW (ref >90)
GFR calc non Af Amer: 69 mL/min — ABNORMAL LOW (ref >90)
Glucose, Bld: 110 mg/dL — ABNORMAL HIGH (ref 70–99)
Total Bilirubin: 0.4 mg/dL (ref 0.3–1.2)

## 2012-06-13 LAB — CARDIAC PANEL(CRET KIN+CKTOT+MB+TROPI)
CK, MB: 1.2 ng/mL (ref 0.3–4.0)
CK, MB: 1.3 ng/mL (ref 0.3–4.0)
Relative Index: INVALID (ref 0.0–2.5)
Total CK: 43 U/L (ref 7–177)
Troponin I: <0.3 ng/mL (ref <0.30)
Troponin I: <0.3 ng/mL (ref <0.30)

## 2012-06-13 LAB — LIPID PANEL
Cholesterol: 142 mg/dL (ref 0–200)
LDL Cholesterol: 79 mg/dL (ref 0–99)
Triglycerides: 65 mg/dL (ref <150)
VLDL: 13 mg/dL (ref 0–40)

## 2012-06-13 LAB — HEPARIN LEVEL (UNFRACTIONATED)
Heparin Unfractionated: 0.57 IU/mL (ref 0.30–0.70)
Heparin Unfractionated: 0.55 IU/mL (ref 0.30–0.70)

## 2012-06-13 LAB — PROTIME-INR
INR: 1.14 (ref 0.00–1.49)
Prothrombin Time: 14.8 seconds (ref 11.6–15.2)

## 2012-06-13 LAB — MAGNESIUM: Magnesium: 2 mg/dL (ref 1.5–2.5)

## 2012-06-13 LAB — TSH: TSH: 24.278 u[IU]/mL — ABNORMAL HIGH (ref 0.350–4.500)

## 2012-06-13 MED ORDER — NITROGLYCERIN IN D5W 200-5 MCG/ML-% IV SOLN: 5.0000 ug/min | INTRAVENOUS | Status: DC

## 2012-06-13 MED ORDER — ASPIRIN EC 81 MG PO TBEC: 81.0000 mg | DELAYED_RELEASE_TABLET | Freq: Every day | ORAL | Status: DC

## 2012-06-13 MED ORDER — TECHNETIUM TC 99M TETROFOSMIN IV KIT
30.0000 | PACK | Freq: Once | INTRAVENOUS | Status: AC | PRN
  Administered 2012-06-13: 30 via INTRAVENOUS

## 2012-06-13 MED ORDER — ASPIRIN 300 MG RE SUPP: 300.0000 mg | RECTAL | Status: DC

## 2012-06-13 MED ORDER — ASPIRIN 81 MG PO CHEW: 324.0000 mg | CHEWABLE_TABLET | ORAL | Status: DC

## 2012-06-13 MED ORDER — ASPIRIN EC 81 MG PO TBEC
81.0000 mg | DELAYED_RELEASE_TABLET | Freq: Every morning | ORAL | Status: DC
  Administered 2012-06-13 – 2012-06-15 (×3): 81 mg via ORAL
  Filled 2012-06-13 (×3): qty 1

## 2012-06-13 MED ORDER — TECHNETIUM TC 99M TETROFOSMIN IV KIT
10.0000 | PACK | Freq: Once | INTRAVENOUS | Status: AC | PRN
  Administered 2012-06-13: 10 via INTRAVENOUS

## 2012-06-13 MED ORDER — REGADENOSON 0.4 MG/5ML IV SOLN
INTRAVENOUS | Status: AC
  Administered 2012-06-13: 10:00:00
  Filled 2012-06-13: qty 5

## 2012-06-13 MED ORDER — ATORVASTATIN CALCIUM 10 MG PO TABS
10.0000 mg | ORAL_TABLET | Freq: Every morning | ORAL | Status: DC
  Administered 2012-06-13 – 2012-06-15 (×3): 10 mg via ORAL
  Filled 2012-06-13 (×3): qty 1

## 2012-06-13 MED ORDER — REGADENOSON 0.4 MG/5ML IV SOLN
0.4000 mg | Freq: Once | INTRAVENOUS | Status: AC
  Administered 2012-06-13: 0.4 mg via INTRAVENOUS

## 2012-06-13 MED ORDER — METOPROLOL TARTRATE 25 MG PO TABS
25.0000 mg | ORAL_TABLET | Freq: Two times a day (BID) | ORAL | Status: DC
  Administered 2012-06-13 (×3): 25 mg via ORAL
  Filled 2012-06-13 (×5): qty 1

## 2012-06-13 MED ORDER — ACETAMINOPHEN 325 MG PO TABS
650.0000 mg | ORAL_TABLET | ORAL | Status: DC | PRN
  Administered 2012-06-14 (×2): 650 mg via ORAL
  Filled 2012-06-13 (×2): qty 2

## 2012-06-13 MED ORDER — PANTOPRAZOLE SODIUM 40 MG PO TBEC
40.0000 mg | DELAYED_RELEASE_TABLET | Freq: Every day | ORAL | Status: DC
  Administered 2012-06-13 – 2012-06-15 (×3): 40 mg via ORAL
  Filled 2012-06-13 (×3): qty 1

## 2012-06-13 MED ORDER — ONDANSETRON HCL 4 MG/2ML IJ SOLN: 4.0000 mg | Freq: Four times a day (QID) | INTRAMUSCULAR | Status: DC | PRN

## 2012-06-13 MED ORDER — NITROGLYCERIN 0.4 MG SL SUBL: 0.4000 mg | SUBLINGUAL_TABLET | SUBLINGUAL | Status: DC | PRN

## 2012-06-13 MED ORDER — SODIUM CHLORIDE 0.9 % IV BOLUS (SEPSIS)
250.0000 mL | Freq: Once | INTRAVENOUS | Status: AC
  Administered 2012-06-13: 250 mL via INTRAVENOUS

## 2012-06-13 NOTE — Progress Notes (Signed)
ANTICOAGULATION CONSULT NOTE - Follow Up Consult  Pharmacy Consult for Heparin Indication: chest pain/ACS  Allergies  Allergen Reactions  . Penicillins Rash    Patient Measurements: Height: 5\' 8"  (172.7 cm) Weight: 229 lb 8 oz (104.1 kg) IBW/kg (Calculated) : 63.9  6/24 Wt=103kg, Ht=173 cm Heparin Dosing Weight: 88 kg  Vital Signs: Temp: 98.1 F (36.7 C) (06/29 0540) Temp src: Oral (06/29 0540) BP: 97/68 mmHg (06/29 0540) Pulse Rate: 62  (06/29 0540)  Labs:  Basename 06/13/12 0520 06/13/12 0145 06/12/12 2013  HGB 12.1 -- 12.8  HCT 36.3 -- 38.9  PLT 251 -- 246  APTT 77* -- --  LABPROT 14.8 -- --  INR 1.14 -- --  HEPARINUNFRC 0.57 -- --  CREATININE 0.92 -- 0.85  CKTOTAL -- 61 --  CKMB -- 1.2 --  TROPONINI -- <0.30 --    Estimated Creatinine Clearance: 87.3 ml/min (by C-G formula based on Cr of 0.92).   Medical History: Past Medical History  Diagnosis Date  . Myocardial infarction 1997  . Coronary artery disease   . Hypertension   . Arthritis   . Problem with literacy   . Shortness of breath   . Anxiety   . Bronchitis   . Chronic headaches     Assessment: 56 yo F admitted with CP, to begin heparin for r/o ACS.  Heparin level is 0.57 on IV Heparin rate of 1200 units/hr.  This is within desired goal range and no noted bleeding complications.  CBC is stable as is platelets.  Goal of Therapy:  Heparin level 0.3-0.7 units/ml Monitor platelets by anticoagulation protocol: Yes   Plan:  1.  Continue IV heparin at 1200 units/hr 2.  F/u daily CBC and monitor for bleeding complications.  Nadara Mustard, PharmD., MS Clinical Pharmacist Pager:  [phone]  Thank you for allowing pharmacy to be part of this patients care team. 06/13/2012,6:56 AM

## 2012-06-13 NOTE — Progress Notes (Signed)
ANTICOAGULATION CONSULT NOTE - Follow Up Consult  Pharmacy Consult for Heparin Indication: chest pain/ACS  Allergies  Allergen Reactions  . Penicillins Rash    Patient Measurements: Height: 5\' 8"  (172.7 cm) Weight: 229 lb 8 oz (104.1 kg) IBW/kg (Calculated) : 63.9  Heparin Dosing Weight: 88 kg  Vital Signs: Temp: 98.1 F (36.7 C) (06/29 1430) Temp src: Oral (06/29 1430) BP: 100/60 mmHg (06/29 1430) Pulse Rate: 59  (06/29 1430)  Labs:  Basename 06/13/12 1526 06/13/12 1339 06/13/12 0745 06/13/12 0520 06/13/12 0145 06/12/12 2013  HGB -- -- -- 12.1 -- 12.8  HCT -- -- -- 36.3 -- 38.9  PLT -- -- -- 251 -- 246  APTT -- -- -- 77* -- --  LABPROT -- -- -- 14.8 -- --  INR -- -- -- 1.14 -- --  HEPARINUNFRC 0.55 -- -- 0.57 -- --  CREATININE -- -- -- 0.92 -- 0.85  CKTOTAL -- 43 40 -- 61 --  CKMB -- 1.3 1.2 -- 1.2 --  TROPONINI -- <0.30 <0.30 -- <0.30 --    Estimated Creatinine Clearance: 87.3 ml/min (by C-G formula based on Cr of 0.92).   Medications:  Infusions:    . heparin 1,200 Units/hr (06/13/12 0001)  . DISCONTD: nitroGLYCERIN 10 mcg/min (06/13/12 0016)  . DISCONTD: nitroGLYCERIN Stopped (06/13/12 0115)    Assessment: 56 yo F continue on heparin for CP and multiple episodes of nonsustained ventricular bigeminy/trigeminy.  Heparin remains therapeutic on 1200 units/hr.    Goal of Therapy:  Heparin level 0.3-0.7 units/ml Monitor platelets by anticoagulation protocol: Yes   Plan:  Continue heparin at 1200 units/hr. Follow up with AM CBC and heparin level.  [employer], Pharm.D., BCPS Clinical Pharmacist Pager [phone] 06/13/2012 5:14 PM

## 2012-06-13 NOTE — Progress Notes (Signed)
Subjective:  Patient denies any chest pain or shortness of breath complains of occasional skipping of the heartbeat denies any dizziness lightheadedness or syncope. Noted to have multiple episodes of nonsustained ventricular bigeminy and trigeminy and occasional couplets no further episodes of nonsustained VT.  Objective:  Vital Signs in the last 24 hours: Temp:  [97.7 F (36.5 C)-98.1 F (36.7 C)] 98.1 F (36.7 C) (06/29 0540) Pulse Rate:  [51-88] 68  (06/29 1151) Resp:  [10-18] 17  (06/29 0540) BP: (85-130)/(53-86) 105/67 mmHg (06/29 1151) SpO2:  [96 %-100 %] 97 % (06/29 0540) Weight:  [104.1 kg (229 lb 8 oz)] 104.1 kg (229 lb 8 oz) (06/29 0540)  Intake/Output from previous day:   Intake/Output from this shift:    Physical Exam: Neck: no adenopathy, no carotid bruit, no JVD and supple, symmetrical, trachea midline Lungs: clear to auscultation bilaterally Heart: regular rate and rhythm, S1, S2 normal, no murmur, click, rub or gallop Abdomen: soft, non-tender; bowel sounds normal; no masses,  no organomegaly Extremities: extremities normal, atraumatic, no cyanosis or edema  Lab Results:  Basename 06/13/12 0520 06/12/12 2013  WBC 8.3 7.6  HGB 12.1 12.8  PLT 251 246    Basename 06/13/12 0520 06/12/12 2013  NA 141 138  K 3.8 4.0  CL 107 104  CO2 25 24  GLUCOSE 110* 108*  BUN 16 19  CREATININE 0.92 0.85    Basename 06/13/12 0745 06/13/12 0145  TROPONINI <0.30 <0.30   Hepatic Function Panel  Basename 06/13/12 0520  PROT 6.1  ALBUMIN 3.4*  AST 17  ALT 9  ALKPHOS 72  BILITOT 0.4  BILIDIR --  IBILI --    Basename 06/13/12 0520  CHOL 142   No results found for this basename: PROTIME in the last 72 hours  Imaging: Imaging results have been reviewed and Dg Chest 2 View  06/12/2012  *RADIOLOGY REPORT*  Clinical Data: Left-sided chest pain.  Shortness of breath.  Cough and congestion.  CHEST - 2 VIEW  Comparison: Multiple priors, most recently 06/02/2012.   Findings: Lung volumes are slightly low.  No consolidative airspace disease.  No pleural effusions.  No pneumothorax.  Pulmonary vasculature and the cardiomediastinal silhouette are within normal limits.  IMPRESSION: 1.  Slightly low lung volumes without radiographic evidence of acute cardiopulmonary disease.  Original Report Authenticated By: Florencia Reasons, M.D.    Cardiac Studies:  Assessment/Plan:  Status post Chest pain MI ruled out Status post nonsustained wide-complex tachycardia and ventricular bigeminy and trigeminy asymptomatic Coronary artery disease history of MI in the past  Hypertension  Hypercholesteremia  Morbid obesity  History of tobacco abuse  Anxiety disorder  History of bronchial asthma  Plan  Schedule for nuclear stress test today Increase beta blockers as tolerated   LOS: 1 day    Joanann Mies N 06/13/2012, 12:32 PM

## 2012-06-13 NOTE — Progress Notes (Signed)
Monitor tech has notified me of several runs of VTach, the longest one was 15. Patient states that she feels a little dizzy with minimal intermittent chest pain. Notified Dr. Sharyn Lull and he is aware. Will continue to monitor closely. Lajuana Matte, RN

## 2012-06-13 NOTE — ED Notes (Signed)
Pt voices being pain free at this time. Ambulatory to restroom without difficulty

## 2012-06-13 NOTE — Progress Notes (Signed)
Monitor tech informed me of a 12 beats of VTach. Called Dr. Sharyn Lull to notify him and he ordered BNP and Mg labs to be drawn. Will continue to monitor closely. Lajuana Matte, RN

## 2012-06-13 NOTE — Progress Notes (Signed)
Pt had PVCs followed by 3 beats of V-Tach. Pt assessed, denies chest pain or pain in general, and vitals were taken. BP noted to be 97/68, however pt recently taken scheduled metoprolol. BP prior to medication was 109/72. Pt stated did not feel anything during episode of V-Tach. Pt lying in bed without any signs of distress. Will continue to monitor pt.

## 2012-06-13 NOTE — Progress Notes (Signed)
Pt ambulated in hallway 500 feet with standby assist. Pt tolerated well.   Alfonso Ellis, RN

## 2012-06-13 NOTE — ED Notes (Signed)
Pt c/o chest pain. Started nitro as ordered, will attemtp to relieve cp with nitro, if unsuccessful will notify MD for change in bed placement

## 2012-06-13 NOTE — Progress Notes (Signed)
Pt had a 14 beat run of VT followed by a 16 beat run of VT and then a 12 beat run of VT. Pt states she has intermittent chest pain and shortness of breath. Pt not currently having chest pain. BP 96/62. O2 sat 95% on RA. Pt now in SB/SR with HR in 50s-60s. Dr. Sharyn Lull notified. Will continue to closely monitor pt.   Alfonso Ellis, RN

## 2012-06-14 LAB — HEPARIN LEVEL (UNFRACTIONATED): Heparin Unfractionated: 0.85 IU/mL — ABNORMAL HIGH (ref 0.30–0.70)

## 2012-06-14 LAB — CBC
Hemoglobin: 11.9 g/dL — ABNORMAL LOW (ref 12.0–15.0)
MCH: 28.2 pg (ref 26.0–34.0)
MCHC: 32.4 g/dL (ref 30.0–36.0)
Platelets: 223 10*3/uL (ref 150–400)

## 2012-06-14 MED ORDER — AMIODARONE HCL 200 MG PO TABS
200.0000 mg | ORAL_TABLET | Freq: Two times a day (BID) | ORAL | Status: DC
  Administered 2012-06-14 – 2012-06-15 (×4): 200 mg via ORAL
  Filled 2012-06-14 (×5): qty 1

## 2012-06-14 MED ORDER — HEPARIN (PORCINE) IN NACL 100-0.45 UNIT/ML-% IJ SOLN
1000.0000 [IU]/h | INTRAMUSCULAR | Status: DC
  Filled 2012-06-14 (×2): qty 250

## 2012-06-14 MED ORDER — METOPROLOL TARTRATE 12.5 MG HALF TABLET
12.5000 mg | ORAL_TABLET | Freq: Two times a day (BID) | ORAL | Status: DC
  Administered 2012-06-15: 12.5 mg via ORAL
  Filled 2012-06-14 (×4): qty 1

## 2012-06-14 NOTE — Progress Notes (Signed)
Pt has been having frequent runs of nonsustained VT every few minutes ranging from 4-11 beats. Pt sleeping in bed. Once awake, pt asymptomatic. BP 96/62. HR in 50s SB. 96% on RA. Rapid response RN called but no action taken. Crash cart placed outside of room. Dr. Sharyn Lull notified. Orders received to start Amiodarone PO and decrease Lopressor. Will give pt first dose of Amiodarone now and continue to monitor closely.  Alfonso Ellis, RN

## 2012-06-14 NOTE — Progress Notes (Signed)
ANTICOAGULATION CONSULT NOTE - Follow Up Consult  Pharmacy Consult for Heparin Indication: chest pain/ACS  Allergies  Allergen Reactions  . Penicillins Rash    Patient Measurements: Height: 5\' 8"  (172.7 cm) Weight: 229 lb 8 oz (104.1 kg) IBW/kg (Calculated) : 63.9  Heparin Dosing Weight: 88 kg  Vital Signs: Temp: 97.6 F (36.4 C) (06/30 0543) Temp src: Oral (06/30 0543) BP: 90/60 mmHg (06/30 0543) Pulse Rate: 57  (06/30 0543)  Labs:  Alvira Philips 06/14/12 0620 06/13/12 1526 06/13/12 1339 06/13/12 0745 06/13/12 0520 06/13/12 0145 06/12/12 2013  HGB 11.9* -- -- -- 12.1 -- --  HCT 36.7 -- -- -- 36.3 -- 38.9  PLT 223 -- -- -- 251 -- 246  APTT -- -- -- -- 77* -- --  LABPROT -- -- -- -- 14.8 -- --  INR -- -- -- -- 1.14 -- --  HEPARINUNFRC 0.85* 0.55 -- -- 0.57 -- --  CREATININE -- -- -- -- 0.92 -- 0.85  CKTOTAL -- -- 43 40 -- 61 --  CKMB -- -- 1.3 1.2 -- 1.2 --  TROPONINI -- -- <0.30 <0.30 -- <0.30 --    Estimated Creatinine Clearance: 87.3 ml/min (by C-G formula based on Cr of 0.92).   Medications:  Infusions:     . heparin 1,200 Units/hr (06/13/12 1746)    Assessment: 56 yo F continue on heparin for CP and multiple episodes of nonsustained ventricular bigeminy/trigeminy.  Heparin supratherapeutic this a.m. on 1200 units/hr.  No bleeding noted.  Goal of Therapy:  Heparin level 0.3-0.7 units/ml Monitor platelets by anticoagulation protocol: Yes   Plan:  Decrease heparin to 1000 units/hr. F/u 6 hr heparin level.  Christoper Fabian, PharmD, BCPS Clinical pharmacist, pager [phone] 06/14/2012 7:42 AM

## 2012-06-14 NOTE — Progress Notes (Signed)
ANTICOAGULATION CONSULT NOTE - Follow Up Consult  Pharmacy Consult for Heparin Indication: chest pain/ACS  Allergies  Allergen Reactions  . Penicillins Rash    Patient Measurements: Height: 5\' 8"  (172.7 cm) Weight: 229 lb 8 oz (104.1 kg) IBW/kg (Calculated) : 63.9  Heparin Dosing Weight: 88 kg  Vital Signs: Temp: 97.9 F (36.6 C) (06/30 1413) Temp src: Oral (06/30 1413) BP: 91/61 mmHg (06/30 1413) Pulse Rate: 51  (06/30 1413)  Labs:  Basename 06/14/12 1540 06/14/12 0620 06/13/12 1526 06/13/12 1339 06/13/12 0745 06/13/12 0520 06/13/12 0145 06/12/12 2013  HGB -- 11.9* -- -- -- 12.1 -- --  HCT -- 36.7 -- -- -- 36.3 -- 38.9  PLT -- 223 -- -- -- 251 -- 246  APTT -- -- -- -- -- 77* -- --  LABPROT -- -- -- -- -- 14.8 -- --  INR -- -- -- -- -- 1.14 -- --  HEPARINUNFRC 0.69 0.85* 0.55 -- -- -- -- --  CREATININE -- -- -- -- -- 0.92 -- 0.85  CKTOTAL -- -- -- 43 40 -- 61 --  CKMB -- -- -- 1.3 1.2 -- 1.2 --  TROPONINI -- -- -- <0.30 <0.30 -- <0.30 --    Estimated Creatinine Clearance: 87.3 ml/min (by C-G formula based on Cr of 0.92).   Medications:  Infusions:     . heparin 1,000 Units/hr (06/14/12 0745)  . DISCONTD: heparin 1,200 Units/hr (06/13/12 1746)    Assessment: 56 yo F continues on heparin for CP and multiple episodes of nonsustained ventricular bigeminy/trigeminy.  Heparin now therapeutic on 1000 units/hr.  No bleeding noted.  Nuclear stress test was negative for ischemia.  Goal of Therapy:  Heparin level 0.3-0.7 units/ml Monitor platelets by anticoagulation protocol: Yes   Plan:  Continue heparin at 1000 units.hr. Continue daily heparin levels and CBCs.   [employer], Pharm.D., BCPS Clinical Pharmacist Pager [phone] 06/14/2012 6:04 PM

## 2012-06-14 NOTE — Progress Notes (Signed)
Subjective:  Patient complains of occasional skipping of the heartbeat associated with vague chest discomfort. Noted to have multiple episodes of nonsustained VT started on low-dose amiodarone. Nuclear stress test was negative for ischemia with good LV systolic function  Objective:  Vital Signs in the last 24 hours: Temp:  [97.6 F (36.4 C)-98.2 F (36.8 C)] 97.6 F (36.4 C) (06/30 0543) Pulse Rate:  [52-88] 57  (06/30 0543) Resp:  [16-18] 16  (06/30 0543) BP: (85-105)/(60-67) 96/60 mmHg (06/30 1000) SpO2:  [91 %-98 %] 97 % (06/30 0543)  Intake/Output from previous day: 06/29 0701 - 06/30 0700 In: 360 [P.O.:360] Out: 1352 [Urine:1352] Intake/Output from this shift:    Physical Exam: Neck: no adenopathy, no carotid bruit, no JVD and supple, symmetrical, trachea midline Lungs: clear to auscultation bilaterally Heart: regular rate and rhythm, S1, S2 normal and Systolic murmur noted Abdomen: soft, non-tender; bowel sounds normal; no masses,  no organomegaly Extremities: extremities normal, atraumatic, no cyanosis or edema  Lab Results:  [facility] 06/14/12 0620 06/13/12 0520  WBC 7.8 8.3  HGB 11.9* 12.1  PLT 223 251    Basename 06/13/12 0520 06/12/12 2013  NA 141 138  K 3.8 4.0  CL 107 104  CO2 25 24  GLUCOSE 110* 108*  BUN 16 19  CREATININE 0.92 0.85    Basename 06/13/12 1339 06/13/12 0745  TROPONINI <0.30 <0.30   Hepatic Function Panel  Basename 06/13/12 0520  PROT 6.1  ALBUMIN 3.4*  AST 17  ALT 9  ALKPHOS 72  BILITOT 0.4  BILIDIR --  IBILI --    Basename 06/13/12 0520  CHOL 142   No results found for this basename: PROTIME in the last 72 hours  Imaging: Imaging results have been reviewed and Dg Chest 2 View  06/12/2012  *RADIOLOGY REPORT*  Clinical Data: Left-sided chest pain.  Shortness of breath.  Cough and congestion.  CHEST - 2 VIEW  Comparison: Multiple priors, most recently 06/02/2012.  Findings: Lung volumes are slightly low.  No consolidative  airspace disease.  No pleural effusions.  No pneumothorax.  Pulmonary vasculature and the cardiomediastinal silhouette are within normal limits.  IMPRESSION: 1.  Slightly low lung volumes without radiographic evidence of acute cardiopulmonary disease.  Original Report Authenticated By: Florencia Reasons, M.D.   Nm Myocar Multi W/spect W/wall Motion / Ef  06/13/2012  *RADIOLOGY REPORT*  Clinical Data:  Chest pain  Technique:  Standard myocardial SPECT imaging performed after resting intravenous injection of 10 mCi Tc-36m tetrofosmin. Subsequently, intravenous infusion of Lexiscan performed under the supervision of the Cardiology staff.  At peak effect of the drug, 30 mCi Tc-71mtetrofosmin injected intravenously and standard myocardial SPECT imaging performed.  Quantitative gated imaging also performed to evaluate left ventricular wall motion and estimate left ventricular ejection fraction.  Comparison:  09/22/2011  MYOCARDIAL IMAGING WITH SPECT (REST AND PHARMACOLOGIC-STRESS)  Findings:  No evidence for pharmacologically induced myocardial reversibility.  The summed difference score is equal to zero.  GATED LEFT VENTRICULAR WALL MOTION STUDY  Findings:  Review of the gated images demonstrates there is moderate hypokinesis involving the inferior lateral wall. This is new since the previous exam.  LEFT VENTRICULAR EJECTION FRACTION  Findings:  QGS ejection fraction measures 58% , with an end- diastolic volume of 89 ml and an end-systolic volume of 37 ml. Previously the left ventricular ejection fraction equal 70%.  IMPRESSION:  1.  No evidence for pharmacologically induced myocardial ischemia. 2.  Inferolateral hypokinesis, new from previous exam. 3.  Left ventricular ejection fraction equals 58%.  This is decreased from 70% previously.  Original Report Authenticated By: Rosealee Albee, M.D.    Cardiac Studies:  Assessment/Plan:  Status post Chest pain MI ruled out  Status post nonsustained wide-complex  tachycardia and ventricular bigeminy and trigeminy asymptomatic  Coronary artery disease history of MI in the past  Hypertension  Hypercholesteremia  Morbid obesity  History of tobacco abuse  Anxiety disorder  History of bronchial asthma  Plan  Continue low-dose amiodarone for now Increase beta blockers as blood pressure tolerates May need EP evaluation Dr. Algie Coffer to follow patient from a.m.  LOS: 2 days    [name] 06/14/2012, 10:32 AM

## 2012-06-15 LAB — CBC
MCHC: 33.3 g/dL (ref 30.0–36.0)
Platelets: 244 10*3/uL (ref 150–400)
RDW: 14.7 % (ref 11.5–15.5)
WBC: 7.3 10*3/uL (ref 4.0–10.5)

## 2012-06-15 LAB — HEPARIN LEVEL (UNFRACTIONATED): Heparin Unfractionated: 0.57 IU/mL (ref 0.30–0.70)

## 2012-06-15 MED ORDER — NITROGLYCERIN 0.4 MG SL SUBL: 0.4000 mg | SUBLINGUAL_TABLET | SUBLINGUAL | Status: DC | PRN

## 2012-06-15 NOTE — Discharge Summary (Signed)
Physician Discharge Summary  Patient ID: [name] MRN: [id number] DOB/AGE: [birth year] 56 y.o.  Admit date: 06/12/2012 Discharge date: 06/15/2012  Admission Diagnoses: Status post Chest pain MI ruled out  Status post nonsustained wide-complex tachycardia and ventricular bigeminy and trigeminy asymptomatic  Coronary artery disease history of MI in the past  Hypertension  Hypercholesteremia  Morbid obesity  History of tobacco abuse  Anxiety disorder  History of bronchial asthma   Discharge Diagnoses:  Principle Problems: *Chest pain* Nonsustained wide-complex tachycardia and ventricular bigeminy and trigeminy asymptomatic  Hypertension  Hypercholesteremia  Morbid obesity  History of tobacco abuse  Anxiety disorder  History of bronchial asthma    Discharged Condition: good  Hospital Course: 56 years old white female with recurrent chest pain in spite of normal cardiac cath 8 months ago. Nuclear stress test was negative for reversible ischemia. She was discharged home in satisfactory condition.   Consults: None  Significant Diagnostic Studies: labs: CBC and electrolytes within normal limits.  Nuclear medicine:1. No evidence for pharmacologically induced myocardial ischemia.  2. Inferolateral hypokinesis, new from previous exam.  3. Left ventricular ejection fraction equals 58%. This is decreased from 70% previously.  Treatments: cardiac meds: metoprolol and amiodarone  Discharge Exam: Blood pressure 147/77, pulse 52, temperature 97 F (36.1 C), temperature source Oral, resp. rate 16, height 5\' 8"  (1.727 m), weight 104.1 kg (229 lb 8 oz), SpO2 96.00%. Constitutional: She is oriented to person, place, and time. She appears well-developed and well-nourished.  HENT: Normocephalic and atraumatic. Mouth/Throat: No oropharyngeal exudate. Eyes: Conjunctivae and EOM are normal. Pupils are equal, round, and reactive to light. No scleral icterus.  Neck: Normal range of motion.  Neck supple. No JVD present. No tracheal deviation present. No thyromegaly present.  Cardiovascular: Normal rate, regular rhythm and normal heart sounds. Exam reveals no gallop and no friction rub. No murmur heard.  Respiratory: Effort normal and breath sounds normal. No respiratory distress. She has no wheezes. She has no rales. She exhibits no tenderness.  GI: Soft. Bowel sounds are normal. There is no tenderness. There is no rebound.  Musculoskeletal: She exhibits no edema and no tenderness.  Neurological: She is alert and oriented to person, place, and time. Moves all 4 extremities   Disposition: 01-Home or Self Care  Discharge Orders    Future Appointments: Provider: Department: Dept Phone: Center:   06/29/2012 1:45 PM Nestor Ramp, MD [facility] [phone] [facility]     Medication List  As of 06/15/2012 11:04 AM   TAKE these medications         albuterol 108 (90 BASE) MCG/ACT inhaler   Commonly known as: PROVENTIL HFA;VENTOLIN HFA   Inhale 2 puffs into the lungs every 6 (six) hours as needed. For wheezing or shortness of breath      aspirin EC 81 MG tablet   Take 81 mg by mouth every morning.      atorvastatin 10 MG tablet   Commonly known as: LIPITOR   Take 10 mg by mouth every morning.      diphenhydramine-acetaminophen 25-500 MG Tabs   Commonly known as: TYLENOL PM   Take 1 tablet by mouth at bedtime as needed. For back & arm pain      metoprolol succinate 50 MG 24 hr tablet   Commonly known as: TOPROL-XL   Take 50 mg by mouth daily. Take with or immediately following a meal.      nitroGLYCERIN 0.4 MG SL tablet   Commonly known as:  NITROSTAT   Place 1 tablet (0.4 mg total) under the tongue every 5 (five) minutes x 3 doses as needed for chest pain.           Follow-up Information    Follow up with [facility], MD. Schedule an appointment as soon as possible for a visit in 2 weeks.   Contact information:   [address] [place]  [address] [phone]          Signed: Ricki Rodriguez 06/15/2012, 11:04 AM

## 2012-06-22 DIAGNOSIS — M25569 Pain in unspecified knee: Secondary | ICD-10-CM | POA: Insufficient documentation

## 2012-06-22 DIAGNOSIS — M25519 Pain in unspecified shoulder: Secondary | ICD-10-CM | POA: Insufficient documentation

## 2012-06-22 DIAGNOSIS — R51 Headache: Secondary | ICD-10-CM | POA: Insufficient documentation

## 2012-06-22 DIAGNOSIS — Z7982 Long term (current) use of aspirin: Secondary | ICD-10-CM | POA: Insufficient documentation

## 2012-06-22 DIAGNOSIS — F411 Generalized anxiety disorder: Secondary | ICD-10-CM | POA: Insufficient documentation

## 2012-06-22 DIAGNOSIS — M545 Low back pain, unspecified: Secondary | ICD-10-CM | POA: Insufficient documentation

## 2012-06-22 DIAGNOSIS — I252 Old myocardial infarction: Secondary | ICD-10-CM | POA: Insufficient documentation

## 2012-06-22 DIAGNOSIS — I10 Essential (primary) hypertension: Secondary | ICD-10-CM | POA: Insufficient documentation

## 2012-06-22 DIAGNOSIS — T07XXXA Unspecified multiple injuries, initial encounter: Secondary | ICD-10-CM | POA: Insufficient documentation

## 2012-06-22 DIAGNOSIS — Z79899 Other long term (current) drug therapy: Secondary | ICD-10-CM | POA: Insufficient documentation

## 2012-06-22 DIAGNOSIS — I2510 Atherosclerotic heart disease of native coronary artery without angina pectoris: Secondary | ICD-10-CM | POA: Insufficient documentation

## 2012-06-22 DIAGNOSIS — W108XXA Fall (on) (from) other stairs and steps, initial encounter: Secondary | ICD-10-CM | POA: Insufficient documentation

## 2012-06-23 ENCOUNTER — Encounter (HOSPITAL_COMMUNITY): Payer: Self-pay | Admitting: *Deleted

## 2012-06-23 ENCOUNTER — Emergency Department (HOSPITAL_COMMUNITY)
Admission: EM | Admit: 2012-06-23 | Discharge: 2012-06-23 | Disposition: A | Discharge status: Home / Self Care | Payer: Medicaid Other | Attending: Emergency Medicine | Admitting: Emergency Medicine

## 2012-06-23 ENCOUNTER — Emergency Department (HOSPITAL_COMMUNITY): Payer: Medicaid Other

## 2012-06-23 DIAGNOSIS — T07XXXA Unspecified multiple injuries, initial encounter: Secondary | ICD-10-CM

## 2012-06-23 LAB — CBC WITH DIFFERENTIAL/PLATELET
Basophils Relative: 1 % (ref 0–1)
Eosinophils Absolute: 0.4 10*3/uL (ref 0.0–0.7)
Hemoglobin: 13.7 g/dL (ref 12.0–15.0)
MCHC: 32.9 g/dL (ref 30.0–36.0)
Monocytes Relative: 6 % (ref 3–12)
Neutro Abs: 4.8 10*3/uL (ref 1.7–7.7)
Neutrophils Relative %: 57 % (ref 43–77)
Platelets: 267 10*3/uL (ref 150–400)
RBC: 4.87 MIL/uL (ref 3.87–5.11)

## 2012-06-23 LAB — URINALYSIS, ROUTINE W REFLEX MICROSCOPIC
Bilirubin Urine: NEGATIVE
Glucose, UA: NEGATIVE mg/dL
Hgb urine dipstick: NEGATIVE
Ketones, ur: NEGATIVE mg/dL
Specific Gravity, Urine: 1.02 (ref 1.005–1.030)
pH: 5.5 (ref 5.0–8.0)

## 2012-06-23 LAB — URINE MICROSCOPIC-ADD ON

## 2012-06-23 LAB — COMPREHENSIVE METABOLIC PANEL
ALT: 16 U/L (ref 0–35)
AST: 18 U/L (ref 0–37)
Albumin: 3.9 g/dL (ref 3.5–5.2)
Alkaline Phosphatase: 89 U/L (ref 39–117)
BUN: 19 mg/dL (ref 6–23)
Chloride: 103 mEq/L (ref 96–112)
Potassium: 3.6 mEq/L (ref 3.5–5.1)
Sodium: 139 mEq/L (ref 135–145)
Total Bilirubin: 0.3 mg/dL (ref 0.3–1.2)
Total Protein: 7.2 g/dL (ref 6.0–8.3)

## 2012-06-23 LAB — TROPONIN I: Troponin I: <0.3 ng/mL (ref <0.30)

## 2012-06-23 MED ORDER — NAPROXEN 250 MG PO TABS
500.0000 mg | ORAL_TABLET | Freq: Once | ORAL | Status: AC
  Administered 2012-06-23: 500 mg via ORAL
  Filled 2012-06-23: qty 2

## 2012-06-23 MED ORDER — OXYCODONE-ACETAMINOPHEN 5-325 MG PO TABS
2.0000 | ORAL_TABLET | Freq: Once | ORAL | Status: AC
  Administered 2012-06-23: 2 via ORAL
  Filled 2012-06-23: qty 2

## 2012-06-23 MED ORDER — NAPROXEN 500 MG PO TABS: 500.0000 mg | ORAL_TABLET | Freq: Two times a day (BID) | ORAL | Status: DC

## 2012-06-23 NOTE — ED Notes (Signed)
Pt c/o  Lower back pain with sob and pain in her lt shgoulder and arm.  Nauseated  And vomiting

## 2012-06-23 NOTE — ED Notes (Signed)
Patient currently sitting up in bed; no respiratory or acute distress noted.  Patient updated on plan of care; informed patient that we are currently waiting on EDP to come and talk about x-ray results.  Patient has no other questions or concerns at this time; will continue to monitor.

## 2012-06-23 NOTE — ED Notes (Signed)
Dr Miller at bedside. 

## 2012-06-23 NOTE — ED Notes (Signed)
Patient back from x-ray 

## 2012-06-23 NOTE — ED Notes (Signed)
Patient currently sitting up in bed; no respiratory or acute distress noted.  Patient updated on plan of care; informed patient that we are currently waiting on further orders from EDP.  Patient has no other questions or concerns at this time; will continue to monitor. 

## 2012-06-23 NOTE — ED Provider Notes (Signed)
History     CSN: [id number]  Arrival date & time 06/22/12  2345   First MD Initiated Contact with Patient 06/23/12 0124      Chief Complaint  Patient presents with  . mulriple symptoms     (Consider location/radiation/quality/duration/timing/severity/associated sxs/prior treatment) HPI Comments: 56-year-old female with multiple medical problems who presents with pain in her left shoulder, left knee, lower back and left side of her chest after she fell down the stairs yesterday. She states that she was walking down the stairs at her foot flops, her flip-flop got caught, she slid down about 5 stairs on her back and left side causing pain and bruising. This has been persistent, mild, worse with ambulation and movement of the affected joints. She denies associated injuries, coughing, swelling, vomiting, dysuria, diarrhea.  The history is provided by the patient and a relative.    Past Medical History  Diagnosis Date  . Myocardial infarction 1997  . Coronary artery disease   . Hypertension   . Arthritis   . Problem with literacy   . Shortness of breath   . Anxiety   . Bronchitis   . Chronic headaches     Past Surgical History  Procedure Date  . Ectopic pregnancy surgery     right    Family History  Problem Relation Age of Onset  . Cancer Mother 58    lung ca  . Diabetes Mother   . Anxiety disorder Sister   . Cancer Brother 60    lung    History  Substance Use Topics  . Smoking status: Former Smoker -- 1.0 packs/day for 3 years    Quit date: 12/03/2010  . Smokeless tobacco: Not on file  . Alcohol Use: No    OB History    Grav Para Term Preterm Abortions TAB SAB Ect Mult Living                  Review of Systems  All other systems reviewed and are negative.    Allergies  Penicillins  Home Medications   Current Outpatient Rx  Name Route Sig Dispense Refill  . ALBUTEROL SULFATE HFA 108 (90 BASE) MCG/ACT IN AERS Inhalation Inhale 2 puffs into the lungs  every 6 (six) hours as needed. For wheezing or shortness of breath    . ASPIRIN EC 81 MG PO TBEC Oral Take 81 mg by mouth every morning.   2  . ATORVASTATIN CALCIUM 10 MG PO TABS Oral Take 10 mg by mouth every morning.     Marland Kitchen DIPHENHYDRAMINE-APAP (SLEEP) 25-500 MG PO TABS Oral Take 1 tablet by mouth at bedtime as needed. For back & arm pain    . METOPROLOL SUCCINATE ER 50 MG PO TB24 Oral Take 50 mg by mouth daily. Take with or immediately following a meal.    . NITROGLYCERIN 0.4 MG SL SUBL Sublingual Place 1 tablet (0.4 mg total) under the tongue every 5 (five) minutes x 3 doses as needed for chest pain. 25 tablet 1  . NAPROXEN 500 MG PO TABS Oral Take 1 tablet (500 mg total) by mouth 2 (two) times daily with a meal. 30 tablet 0    BP 127/66  Pulse 89  Temp 98.3 F (36.8 C) (Oral)  Resp 16  SpO2 97%  Physical Exam  Nursing note and vitals reviewed. Constitutional: She appears well-developed and well-nourished. No distress.  HENT:  Head: Normocephalic and atraumatic.  Mouth/Throat: Oropharynx is clear and moist. No oropharyngeal exudate.  Eyes: Conjunctivae and EOM are normal. Pupils are equal, round, and reactive to light. Right eye exhibits no discharge. Left eye exhibits no discharge. No scleral icterus.  Neck: Normal range of motion. Neck supple. No JVD present. No thyromegaly present.  Cardiovascular: Normal rate, regular rhythm, normal heart sounds and intact distal pulses.  Exam reveals no gallop and no friction rub.   No murmur heard. Pulmonary/Chest: Effort normal and breath sounds normal. No respiratory distress. She has no wheezes. She has no rales.  Abdominal: Soft. Bowel sounds are normal. She exhibits no distension and no mass. There is no tenderness.  Musculoskeletal: Normal range of motion. She exhibits tenderness ( While tenderness to the left shoulder and left knee, decreased range of motion secondary to pain. Tenderness over the lumbar spine, no step-offs or deformities).  She exhibits no edema.       No decreased range of motion of the hands or wrists bilaterally  Lymphadenopathy:    She has no cervical adenopathy.  Neurological: She is alert. Coordination normal.  Skin: Skin is warm and dry.       No bruising to the dorsum of the bilateral hands, minimal underlying tenderness,  Psychiatric: She has a normal mood and affect. Her behavior is normal.    ED Course  Procedures (including critical care time)  Labs Reviewed  COMPREHENSIVE METABOLIC PANEL - Abnormal; Notable for the following:    Glucose, Bld 141 (*)     GFR calc non Af Amer 81 (*)     All other components within normal limits  URINALYSIS, ROUTINE W REFLEX MICROSCOPIC - Abnormal; Notable for the following:    Leukocytes, UA MODERATE (*)     All other components within normal limits  CBC WITH DIFFERENTIAL  TROPONIN I  URINE MICROSCOPIC-ADD ON   Dg Chest 2 View  06/23/2012  *RADIOLOGY REPORT*  Clinical Data: Chest pain  CHEST - 2 VIEW  Comparison: 09/21/2011  Findings: Mild cardiomegaly.  Clear lungs.  Normal vascularity.  No pneumothorax and no pleural effusion.  IMPRESSION: Cardiomegaly without decompensation.  Original Report Authenticated By: Donavan Burnet, M.D.   Dg Lumbar Spine Complete  06/23/2012  *RADIOLOGY REPORT*  Clinical Data: Status post fall down steps; lower back pain.  LUMBAR SPINE - COMPLETE 4+ VIEW  Comparison: Lumbar spine radiograph performed 09/22/2011  Findings: There is no evidence of fracture or subluxation. Vertebral bodies demonstrate normal height and alignment. Intervertebral disc spaces are preserved.  The visualized neural foramina are grossly unremarkable in appearance.  The visualized bowel gas pattern is unremarkable in appearance; air and stool are noted within the colon.  Mild sclerotic change is noted at the sacroiliac joints.  IMPRESSION: No evidence of fracture or subluxation along the lumbar spine.  Original Report Authenticated By: Tonia Ghent, M.D.   Dg  Shoulder Left  06/23/2012  *RADIOLOGY REPORT*  Clinical Data: Status post fall down steps; left shoulder pain.  LEFT SHOULDER - 2+ VIEW  Comparison: Left shoulder radiographs performed 09/24/2011  Findings: There is no evidence of fracture or dislocation.  The left humeral head is seated within the glenoid fossa.  The acromioclavicular joint is unremarkable in appearance.  No significant soft tissue abnormalities are seen.  The visualized portions of the left lung are clear.  IMPRESSION: No evidence of fracture or dislocation.  Original Report Authenticated By: Tonia Ghent, M.D.   Dg Knee Complete 4 Views Left  06/23/2012  *RADIOLOGY REPORT*  Clinical Data: Status post fall down steps; left knee  pain.  LEFT KNEE - COMPLETE 4+ VIEW  Comparison: None.  Findings: There is no evidence of fracture or dislocation.  The joint spaces are preserved.  No significant degenerative change is seen; the patellofemoral joint is grossly unremarkable in appearance.  No significant joint effusion is seen.  The visualized soft tissues are normal in appearance.  IMPRESSION: No evidence of fracture or dislocation.  Original Report Authenticated By: Tonia Ghent, M.D.     1. Contusion of multiple sites       MDM  Rule out fractures, patient has reproducible tenderness in all locations including chest wall, and left shoulder, left knee, lumbar spine. Vital signs are normal and the EKG shows ectopy but no signs of ischemia. Doubt cardiac etiology for chest pain, much more likely related to the accident. 7%, pulse of 70, temperature 98.1.   ED ECG REPORT  I personally interpreted this EKG   Date: 06/23/2012   Rate: 79  Rhythm: normal sinus rhythm  QRS Axis: normal  Intervals: normal  ST/T Wave abnormalities: nonspecific T wave changes  Conduction Disutrbances:none  Narrative Interpretation: Frequent premature ventricular complexes  Old EKG Reviewed: Nonspecific T waves present in precordial leads since last EKG from  06/14/2012    X-rays negative for fractures or dislocations. EKG unremarkable, doubt cardiac source of chest pain, ice packs and anti-inflammatories recommended, patient given Percocet in the emergency department with some improvement.  Discharge Prescriptions include:  Naprosyn   Vida Roller, MD 06/23/12 [phone]

## 2012-06-23 NOTE — ED Notes (Signed)
Patient given copy of discharge paperwork; went over discharge instructions with patient.  Patient instructed to take prescriptions as directed, to follow up with primary care physician if symptoms persist, and to return to the ED for new, worsening, or concerning symptoms.

## 2012-06-23 NOTE — ED Notes (Addendum)
Patient complaining of lower back pain, left shoulder pain, and shortness of breath upon exertion that started Sunday night (patient reports worsening symptoms tonight).  Patient reports left sided chest pain; patient describes the pain as "intermittent" and "sharp"; currently rates pain 10/10 on the numerical pain scale.  Patient reports history of myocardial infarction.  Patient alert and oriented x4; PERRL present.  Upon arrival to room, patient changed into gown and connected to continuous cardiac, pulse ox, and blood pressure monitor.  Will continue to monitor.

## 2012-06-27 ENCOUNTER — Inpatient Hospital Stay (HOSPITAL_COMMUNITY)
Admission: EM | Admit: 2012-06-27 | Discharge: 2012-06-30 | DRG: 310 | Disposition: A | Discharge status: Home / Self Care | Payer: Self-pay | Attending: Cardiovascular Disease | Admitting: Cardiovascular Disease

## 2012-06-27 ENCOUNTER — Encounter (HOSPITAL_COMMUNITY): Payer: Self-pay | Admitting: Emergency Medicine

## 2012-06-27 ENCOUNTER — Emergency Department (HOSPITAL_COMMUNITY): Payer: Self-pay

## 2012-06-27 DIAGNOSIS — M129 Arthropathy, unspecified: Secondary | ICD-10-CM | POA: Diagnosis present

## 2012-06-27 DIAGNOSIS — I4729 Other ventricular tachycardia: Principal | ICD-10-CM | POA: Diagnosis present

## 2012-06-27 DIAGNOSIS — M1990 Unspecified osteoarthritis, unspecified site: Secondary | ICD-10-CM | POA: Diagnosis present

## 2012-06-27 DIAGNOSIS — E7800 Pure hypercholesterolemia, unspecified: Secondary | ICD-10-CM | POA: Diagnosis present

## 2012-06-27 DIAGNOSIS — R079 Chest pain, unspecified: Secondary | ICD-10-CM | POA: Diagnosis present

## 2012-06-27 DIAGNOSIS — E039 Hypothyroidism, unspecified: Secondary | ICD-10-CM | POA: Diagnosis present

## 2012-06-27 DIAGNOSIS — E669 Obesity, unspecified: Secondary | ICD-10-CM

## 2012-06-27 DIAGNOSIS — I2510 Atherosclerotic heart disease of native coronary artery without angina pectoris: Secondary | ICD-10-CM | POA: Diagnosis present

## 2012-06-27 DIAGNOSIS — I472 Ventricular tachycardia, unspecified: Principal | ICD-10-CM | POA: Diagnosis present

## 2012-06-27 DIAGNOSIS — Z87891 Personal history of nicotine dependence: Secondary | ICD-10-CM

## 2012-06-27 DIAGNOSIS — R51 Headache: Secondary | ICD-10-CM | POA: Diagnosis present

## 2012-06-27 DIAGNOSIS — I10 Essential (primary) hypertension: Secondary | ICD-10-CM | POA: Diagnosis present

## 2012-06-27 DIAGNOSIS — R0602 Shortness of breath: Secondary | ICD-10-CM

## 2012-06-27 DIAGNOSIS — F411 Generalized anxiety disorder: Secondary | ICD-10-CM | POA: Diagnosis present

## 2012-06-27 DIAGNOSIS — I252 Old myocardial infarction: Secondary | ICD-10-CM

## 2012-06-27 LAB — CBC WITH DIFFERENTIAL/PLATELET
Basophils Absolute: 0.1 10*3/uL (ref 0.0–0.1)
Eosinophils Relative: 5 % (ref 0–5)
Lymphocytes Relative: 33 % (ref 12–46)
Lymphs Abs: 2.6 10*3/uL (ref 0.7–4.0)
MCV: 86 fL (ref 78.0–100.0)
Neutrophils Relative %: 55 % (ref 43–77)
Platelets: 264 10*3/uL (ref 150–400)
RBC: 4.73 MIL/uL (ref 3.87–5.11)
RDW: 14.7 % (ref 11.5–15.5)
WBC: 7.8 10*3/uL (ref 4.0–10.5)

## 2012-06-27 LAB — BASIC METABOLIC PANEL
CO2: 26 mEq/L (ref 19–32)
Calcium: 9.3 mg/dL (ref 8.4–10.5)
GFR calc non Af Amer: 74 mL/min — ABNORMAL LOW (ref >90)
Glucose, Bld: 85 mg/dL (ref 70–99)
Potassium: 4.2 mEq/L (ref 3.5–5.1)
Sodium: 141 mEq/L (ref 135–145)

## 2012-06-27 LAB — TROPONIN I: Troponin I: <0.3 ng/mL (ref <0.30)

## 2012-06-27 MED ORDER — AMIODARONE HCL IN DEXTROSE 360-4.14 MG/200ML-% IV SOLN
1.0000 mg/min | INTRAVENOUS | Status: AC
  Filled 2012-06-27 (×2): qty 200

## 2012-06-27 MED ORDER — AMIODARONE HCL IN DEXTROSE 360-4.14 MG/200ML-% IV SOLN
0.5000 mg/min | INTRAVENOUS | Status: DC
  Administered 2012-06-28 – 2012-06-29 (×4): 0.5 mg/min via INTRAVENOUS
  Filled 2012-06-27 (×8): qty 200

## 2012-06-27 MED ORDER — AMIODARONE HCL IN DEXTROSE 360-4.14 MG/200ML-% IV SOLN
INTRAVENOUS | Status: AC
  Filled 2012-06-27: qty 200

## 2012-06-27 MED ORDER — AMIODARONE HCL IN DEXTROSE 360-4.14 MG/200ML-% IV SOLN
60.0000 mg/h | INTRAVENOUS | Status: DC
  Administered 2012-06-27 (×2): 60 mg/h via INTRAVENOUS
  Filled 2012-06-27 (×10): qty 200

## 2012-06-27 MED ORDER — ACETAMINOPHEN 325 MG PO TABS
650.0000 mg | ORAL_TABLET | ORAL | Status: DC | PRN
  Administered 2012-06-28 – 2012-06-29 (×2): 650 mg via ORAL
  Filled 2012-06-27 (×2): qty 2

## 2012-06-27 MED ORDER — HEPARIN SODIUM (PORCINE) 5000 UNIT/ML IJ SOLN
5000.0000 [IU] | Freq: Three times a day (TID) | INTRAMUSCULAR | Status: DC
  Administered 2012-06-28 – 2012-06-30 (×8): 5000 [IU] via SUBCUTANEOUS
  Filled 2012-06-27 (×11): qty 1

## 2012-06-27 MED ORDER — ASPIRIN 300 MG RE SUPP
300.0000 mg | RECTAL | Status: DC
  Filled 2012-06-27: qty 1

## 2012-06-27 MED ORDER — SODIUM CHLORIDE 0.9 % IV BOLUS (SEPSIS)
1000.0000 mL | Freq: Once | INTRAVENOUS | Status: AC
  Administered 2012-06-27: 1000 mL via INTRAVENOUS

## 2012-06-27 MED ORDER — ONDANSETRON HCL 4 MG/2ML IJ SOLN: 4.0000 mg | Freq: Four times a day (QID) | INTRAMUSCULAR | Status: DC | PRN

## 2012-06-27 MED ORDER — ASPIRIN EC 81 MG PO TBEC
81.0000 mg | DELAYED_RELEASE_TABLET | Freq: Every day | ORAL | Status: DC
  Administered 2012-06-28 – 2012-06-30 (×3): 81 mg via ORAL
  Filled 2012-06-27 (×3): qty 1

## 2012-06-27 MED ORDER — METOPROLOL TARTRATE 12.5 MG HALF TABLET
12.5000 mg | ORAL_TABLET | Freq: Two times a day (BID) | ORAL | Status: DC
  Administered 2012-06-28 – 2012-06-29 (×4): 12.5 mg via ORAL
  Filled 2012-06-27 (×5): qty 1

## 2012-06-27 MED ORDER — ASPIRIN 81 MG PO CHEW: 324.0000 mg | CHEWABLE_TABLET | ORAL | Status: DC

## 2012-06-27 MED ORDER — ASPIRIN EC 81 MG PO TBEC: 81.0000 mg | DELAYED_RELEASE_TABLET | Freq: Every morning | ORAL | Status: DC

## 2012-06-27 MED ORDER — SODIUM CHLORIDE 0.9 % IV SOLN
INTRAVENOUS | Status: DC
  Administered 2012-06-28 – 2012-06-29 (×5): via INTRAVENOUS

## 2012-06-27 MED ORDER — ATORVASTATIN CALCIUM 10 MG PO TABS
10.0000 mg | ORAL_TABLET | Freq: Every day | ORAL | Status: DC
  Administered 2012-06-28 – 2012-06-29 (×2): 10 mg via ORAL
  Filled 2012-06-27 (×3): qty 1

## 2012-06-27 MED ORDER — AMIODARONE IV BOLUS ONLY 150 MG/100ML
150.0000 mg | Freq: Once | INTRAVENOUS | Status: AC
  Administered 2012-06-27: 150 mg via INTRAVENOUS
  Filled 2012-06-27: qty 100

## 2012-06-27 MED ORDER — NITROGLYCERIN 0.4 MG SL SUBL: 0.4000 mg | SUBLINGUAL_TABLET | SUBLINGUAL | Status: DC | PRN

## 2012-06-27 MED ORDER — ASPIRIN 81 MG PO CHEW
324.0000 mg | CHEWABLE_TABLET | Freq: Once | ORAL | Status: AC
  Administered 2012-06-27: 243 mg via ORAL
  Filled 2012-06-27: qty 3

## 2012-06-27 MED ORDER — PANTOPRAZOLE SODIUM 40 MG PO TBEC
40.0000 mg | DELAYED_RELEASE_TABLET | Freq: Every day | ORAL | Status: DC
  Administered 2012-06-28 – 2012-06-30 (×4): 40 mg via ORAL
  Filled 2012-06-27 (×5): qty 1

## 2012-06-27 NOTE — ED Notes (Signed)
Patient tolerated amiodarone w/o diff,  Drip given over 10 min with micron filter.  Patient with occassional pvc noted on monitor.  She is also reporting some chest pain.  Family at bedside.  Patient placed on zoll per protocol and oxygen via nasal canula.

## 2012-06-27 NOTE — ED Notes (Addendum)
Pt from home with c/o intermittant shortness of breath for a couple of weeks. Vitals for EMS 120/76, 64. Pt also c/o right side pain ongoing since the 9th. Pt also reports intermittent feeling of flutter in her chest.

## 2012-06-27 NOTE — ED Notes (Signed)
Pt undressed, in gown (by Toniann Fail, RN), on monitor, continuous pulse oximetry and blood pressure cuff

## 2012-06-27 NOTE — ED Notes (Signed)
Pt showing V-tach on the monitor, this RN evaluated pt, pt lying in the bed not moving, pt reports having episodes of her heart "feeling funny" and sob

## 2012-06-27 NOTE — ED Provider Notes (Signed)
History     CSN: [id number]  Arrival date & time 06/27/12  1549   First MD Initiated Contact with Patient 06/27/12 1642      Chief Complaint  Patient presents with  . Shortness of Breath    (Consider location/radiation/quality/duration/timing/severity/associated sxs/prior treatment) HPI  56 year old female with history of prior MI, CAD, anxiety, and bronchitis presents complaining of shortness of breath. She reports intermittent shortness of breath and chest discomfort for the past several weeks. She reports whenever she exhibits associated with have shortness of breath and mild chest discomfort to her mid chest. Symptoms improved with resting. Shortness of breath is accompanied by dry cough. She reports no fever, chills, hemoptysis, diaphoresis, nausea, vomiting, abdominal pain. Patient also mentioned that she fell last week and has noticed pain to her right ribs. Pain worsen with breathing.  Has not notice any bruising.  Sts her nitro sometimes alleviate her discomfort. Is a former smoker.  Has cardiac stress test performed on June 28th which shows no reproducible MI.  Pt also reports that her right leg has been aching and she notice swelling for the past few weeks.  Denies recent surgery, exogenous hormone use, prolonged bedrest, or prior hx of VTE.    Past Medical History  Diagnosis Date  . Myocardial infarction 1997  . Coronary artery disease   . Hypertension   . Arthritis   . Problem with literacy   . Shortness of breath   . Anxiety   . Bronchitis   . Chronic headaches     Past Surgical History  Procedure Date  . Ectopic pregnancy surgery     right    Family History  Problem Relation Age of Onset  . Cancer Mother 14    lung ca  . Diabetes Mother   . Anxiety disorder Sister   . Cancer Brother 60    lung    History  Substance Use Topics  . Smoking status: Former Smoker -- 1.0 packs/day for 3 years    Quit date: 12/03/2010  . Smokeless tobacco: Not on file  .  Alcohol Use: No    OB History    Grav Para Term Preterm Abortions TAB SAB Ect Mult Living                  Review of Systems  All other systems reviewed and are negative.    Allergies  Penicillins  Home Medications   Current Outpatient Rx  Name Route Sig Dispense Refill  . ALBUTEROL SULFATE HFA 108 (90 BASE) MCG/ACT IN AERS Inhalation Inhale 2 puffs into the lungs every 6 (six) hours as needed. For wheezing or shortness of breath    . ASPIRIN EC 81 MG PO TBEC Oral Take 81 mg by mouth every morning.   2  . ATORVASTATIN CALCIUM 10 MG PO TABS Oral Take 10 mg by mouth every morning.     Marland Kitchen DIPHENHYDRAMINE-APAP (SLEEP) 25-500 MG PO TABS Oral Take 1 tablet by mouth at bedtime as needed. For back & arm pain    . METOPROLOL SUCCINATE ER 50 MG PO TB24 Oral Take 50 mg by mouth daily. Take with or immediately following a meal.    . NAPROXEN 500 MG PO TABS Oral Take 1 tablet (500 mg total) by mouth 2 (two) times daily with a meal. 30 tablet 0  . NITROGLYCERIN 0.4 MG SL SUBL Sublingual Place 1 tablet (0.4 mg total) under the tongue every 5 (five) minutes x 3 doses as needed for  chest pain. 25 tablet 1    BP 103/54  Pulse 61  Temp 98.2 F (36.8 C) (Oral)  Resp 14  SpO2 98%  Physical Exam  Nursing note and vitals reviewed. Constitutional: She appears well-developed and well-nourished. No distress.       Awake, alert, nontoxic appearance  HENT:  Head: Atraumatic.  Eyes: Conjunctivae are normal. Right eye exhibits no discharge. Left eye exhibits no discharge.  Neck: Neck supple. No JVD present.  Cardiovascular: Normal rate and regular rhythm.   Pulmonary/Chest: Effort normal. No respiratory distress. She exhibits no tenderness.       Rales heard to R lower lung field.  No wheezes, or rhonchi.    Abdominal: Soft. There is no tenderness. There is no rebound.  Musculoskeletal: She exhibits no edema and no tenderness.       ROM appears intact, no obvious focal weakness  No palpable  cord, erythema, negative Homan sign.  No edema  Neurological:       Mental status and motor strength appears intact  Skin: Skin is warm. No rash noted.  Psychiatric: She has a normal mood and affect.    ED Course  Procedures (including critical care time)  Labs Reviewed - No data to display No results found.   No diagnosis found.   Date: 06/27/2012  Rate: 74  Rhythm: normal sinus rhythm, occasional PVC  QRS Axis: normal  Intervals: normal  ST/T Wave abnormalities: nonspecific T wave changes  Conduction Disutrbances:none  Narrative Interpretation:   Old EKG Reviewed: unchanged  Results for orders placed during the hospital encounter of 06/27/12  CBC WITH DIFFERENTIAL      Component Value Range   WBC 7.8  4.0 - 10.5 K/uL   RBC 4.73  3.87 - 5.11 MIL/uL   Hemoglobin 13.4  12.0 - 15.0 g/dL   HCT 46.9  62.9 - 52.8 %   MCV 86.0  78.0 - 100.0 fL   MCH 28.3  26.0 - 34.0 pg   MCHC 32.9  30.0 - 36.0 g/dL   RDW 41.3  24.4 - 01.0 %   Platelets 264  150 - 400 K/uL   Neutrophils Relative 55  43 - 77 %   Neutro Abs 4.3  1.7 - 7.7 K/uL   Lymphocytes Relative 33  12 - 46 %   Lymphs Abs 2.6  0.7 - 4.0 K/uL   Monocytes Relative 7  3 - 12 %   Monocytes Absolute 0.5  0.1 - 1.0 K/uL   Eosinophils Relative 5  0 - 5 %   Eosinophils Absolute 0.4  0.0 - 0.7 K/uL   Basophils Relative 1  0 - 1 %   Basophils Absolute 0.1  0.0 - 0.1 K/uL  BASIC METABOLIC PANEL      Component Value Range   Sodium 141  135 - 145 mEq/L   Potassium 4.2  3.5 - 5.1 mEq/L   Chloride 105  96 - 112 mEq/L   CO2 26  19 - 32 mEq/L   Glucose, Bld 85  70 - 99 mg/dL   BUN 14  6 - 23 mg/dL   Creatinine, Ser 2.72  0.50 - 1.10 mg/dL   Calcium 9.3  8.4 - 53.6 mg/dL   GFR calc non Af Amer 74 (*) >90 mL/min   GFR calc Af Amer 85 (*) >90 mL/min  TROPONIN I      Component Value Range   Troponin I <0.30  <0.30 ng/mL  PRO B NATRIURETIC PEPTIDE  Component Value Range   Pro B Natriuretic peptide (BNP) 156.0 (*) 0 - 125  pg/mL   Dg Chest 2 View  06/23/2012  *RADIOLOGY REPORT*  Clinical Data: Chest pain  CHEST - 2 VIEW  Comparison: 09/21/2011  Findings: Mild cardiomegaly.  Clear lungs.  Normal vascularity.  No pneumothorax and no pleural effusion.  IMPRESSION: Cardiomegaly without decompensation.  Original Report Authenticated By: Donavan Burnet, M.D.   Dg Chest 2 View  06/12/2012  *RADIOLOGY REPORT*  Clinical Data: Left-sided chest pain.  Shortness of breath.  Cough and congestion.  CHEST - 2 VIEW  Comparison: Multiple priors, most recently 06/02/2012.  Findings: Lung volumes are slightly low.  No consolidative airspace disease.  No pleural effusions.  No pneumothorax.  Pulmonary vasculature and the cardiomediastinal silhouette are within normal limits.  IMPRESSION: 1.  Slightly low lung volumes without radiographic evidence of acute cardiopulmonary disease.  Original Report Authenticated By: Florencia Reasons, M.D.   Dg Chest 2 View  06/02/2012  *RADIOLOGY REPORT*  Clinical Data: Chest pain.  Shortness of breath.  Fever and body aches.  Former smoker.  CHEST - 2 VIEW  Comparison: Two-view chest x-ray 05/21/2012, 02/05/2012, 12/02/2011, 01/17/2011.  Findings: Cardiomediastinal silhouette unremarkable, unchanged. Lungs clear.  Bronchovascular markings normal.  Pulmonary vascularity normal.  No pneumothorax.  No pleural effusions.  Mild degenerative changes involving the lumbar spine.  No significant interval change.  IMPRESSION: No acute cardiopulmonary disease.  Stable examination.  Original Report Authenticated By: Arnell Sieving, M.D.   Dg Lumbar Spine Complete  06/23/2012  *RADIOLOGY REPORT*  Clinical Data: Status post fall down steps; lower back pain.  LUMBAR SPINE - COMPLETE 4+ VIEW  Comparison: Lumbar spine radiograph performed 09/22/2011  Findings: There is no evidence of fracture or subluxation. Vertebral bodies demonstrate normal height and alignment. Intervertebral disc spaces are preserved.  The visualized  neural foramina are grossly unremarkable in appearance.  The visualized bowel gas pattern is unremarkable in appearance; air and stool are noted within the colon.  Mild sclerotic change is noted at the sacroiliac joints.  IMPRESSION: No evidence of fracture or subluxation along the lumbar spine.  Original Report Authenticated By: Tonia Ghent, M.D.   Nm Myocar Multi W/spect W/wall Motion / Ef  06/13/2012  *RADIOLOGY REPORT*  Clinical Data:  Chest pain  Technique:  Standard myocardial SPECT imaging performed after resting intravenous injection of 10 mCi Tc-72m tetrofosmin. Subsequently, intravenous infusion of Lexiscan performed under the supervision of the Cardiology staff.  At peak effect of the drug, 30 mCi Tc-49mtetrofosmin injected intravenously and standard myocardial SPECT imaging performed.  Quantitative gated imaging also performed to evaluate left ventricular wall motion and estimate left ventricular ejection fraction.  Comparison:  09/22/2011  MYOCARDIAL IMAGING WITH SPECT (REST AND PHARMACOLOGIC-STRESS)  Findings:  No evidence for pharmacologically induced myocardial reversibility.  The summed difference score is equal to zero.  GATED LEFT VENTRICULAR WALL MOTION STUDY  Findings:  Review of the gated images demonstrates there is moderate hypokinesis involving the inferior lateral wall. This is new since the previous exam.  LEFT VENTRICULAR EJECTION FRACTION  Findings:  QGS ejection fraction measures 58% , with an end- diastolic volume of 89 ml and an end-systolic volume of 37 ml. Previously the left ventricular ejection fraction equal 70%.  IMPRESSION:  1.  No evidence for pharmacologically induced myocardial ischemia. 2.  Inferolateral hypokinesis, new from previous exam. 3.  Left ventricular ejection fraction equals 58%.  This is decreased from 70% previously.  Original  Report Authenticated By: Rosealee Albee, M.D.   Dg Chest Port 1 View  06/27/2012  *RADIOLOGY REPORT*  Clinical Data: Shortness  of breath.  Cough.  Chest pain.  PORTABLE CHEST - 1 VIEW  Comparison: 06/23/2012  Findings: Low lung volumes are seen however both lungs are clear. Heart size is stable and within normal limits.  No evidence of pleural effusion.  No mass or lymphadenopathy identified.  IMPRESSION: Low lung volumes.  No acute findings.  Original Report Authenticated By: Danae Orleans, M.D.   Dg Shoulder Left  06/23/2012  *RADIOLOGY REPORT*  Clinical Data: Status post fall down steps; left shoulder pain.  LEFT SHOULDER - 2+ VIEW  Comparison: Left shoulder radiographs performed 09/24/2011  Findings: There is no evidence of fracture or dislocation.  The left humeral head is seated within the glenoid fossa.  The acromioclavicular joint is unremarkable in appearance.  No significant soft tissue abnormalities are seen.  The visualized portions of the left lung are clear.  IMPRESSION: No evidence of fracture or dislocation.  Original Report Authenticated By: Tonia Ghent, M.D.   Dg Knee Complete 4 Views Left  06/23/2012  *RADIOLOGY REPORT*  Clinical Data: Status post fall down steps; left knee pain.  LEFT KNEE - COMPLETE 4+ VIEW  Comparison: None.  Findings: There is no evidence of fracture or dislocation.  The joint spaces are preserved.  No significant degenerative change is seen; the patellofemoral joint is grossly unremarkable in appearance.  No significant joint effusion is seen.  The visualized soft tissues are normal in appearance.  IMPRESSION: No evidence of fracture or dislocation.  Original Report Authenticated By: Tonia Ghent, M.D.   Dg Knee Complete 4 Views Right  06/03/2012  *RADIOLOGY REPORT*  Clinical Data: leg pain  RIGHT KNEE - COMPLETE 4+ VIEW  Comparison: None.  Findings: There is no joint effusion.  There is no fracture or subluxation.  No radio-opaque foreign body or soft tissue calcification.  IMPRESSION:  1.  No acute findings.  Original Report Authenticated By: Rosealee Albee, M.D.      MDM  56  years old white female with recurrent chest pain and sob in spite of normal cardiac cath 8 months ago. Nuclear stress test was negative for reversible ischemia. Unchanged ECG.  VSS.    She has a cardiac stress test on June 28th shows no evidence of inducible MI.  Cardiac Cath less than a year ago with no significant finding.    Vital signs and medical records were reviewed and considered.  Labs and imaging were reviewed by me.    5:32 PM Pt has several runs of PVCs while in ED.  Does reports sob when she feels her heart is racing.  Prior note indicate that pt is suppose to be started on amiodarone, however this medication is not listed in her mediation list. WIll consult with Dr. Algie Coffer for further management.  My attending is aware.    7:10 PM Pt is not in CHF, has normal electrolytes, LV function is normal according to prior NM test, Normal coronary exam previously.  I consult with Dr. Sharyn Lull, who recommend to provide IVF, continue with amiodarone treatment here, and d/c pt to f/u with Dr. Algie Coffer for further evaluation.  He recommend not prescribing amiodarone at discharge.    Pt is now back to sinus rhythm and in NAD.    8:30 PM Pt convert from sinus rhythm to wide complex vtach, and does endorse sob.  I have consulted with  Dr. Sharyn Lull again, who agrees to see pt in ED for admission.   Fayrene Helper, PA-C 06/27/12 2119

## 2012-06-27 NOTE — ED Provider Notes (Signed)
Medical screening examination/treatment/procedure(s) were conducted as a shared visit with non-physician practitioner(s) and myself.  I personally evaluated the patient during the encounter.  56yF with dyspnea and palpitations. Frequent runs of nonsustained regular wide complex tachycardia in ED. Pt recently admitted and mention in a note of nonsustained v-tach and that would be started on amiodarone. This is not on current med list and pt not sure if she is taking it.  W/u today fairly unremarkable. Had stress echo during last admit a couple weeks ago and did not show any evidence of inducible ischemia. Will dose amiodarone and discuss with cards.  Raeford Razor, MD 06/27/12 1911

## 2012-06-27 NOTE — ED Notes (Signed)
Because of pt showing signs of v-tach on the monitor, Kohut, MD and Silva Bandy, RN charge was notified; pt placed on oxygen Benedict (2L) and zoll pads as well; pt informed on what was going on, what we were seeing on the monitor as well as the steps that we are going to make to take care of her; pt understands and acknowledges

## 2012-06-27 NOTE — ED Notes (Signed)
Kohut, MD notified re: pt runs of V tach & then converting to SB, MD also notified re: pt BP, will eval to see how pt responds to NS bolus 1 liter bolus

## 2012-06-27 NOTE — H&P (Signed)
[name] is an 56 y.o. female.   Chief Complaint: Palpitations associated with shortness of breath HPI: Patient is 56 year old female with past medical history significant for coronary artery disease history of MI in 1996 hypertension hypercholesteremia morbid obesity history of tobacco abuse anxiety disorder history of bronchial asthma recently discharged from the hospital came to the ER complaining of frequent palpitations associated with shortness of breath. Patient denies any chest pain nausea vomiting diaphoresis denies any lightheadedness or syncope denies history of PND orthopnea leg swelling. Patient was noted to have frequent her nonsustained VT on the monitor her requiring IV amiodarone. And was started on amiodarone drip in the ER. Patient was admitted approximately 2 weeks ago with chest pain and nuclear stress test which was negative for ischemia and was noted to have nonsustained VT bigeminy and trigeminy was started on her by mouth amiodarone during the hospital stay and was discharged home but due to recurrent palpitation patient decided to come to the ER.  Past Medical History  Diagnosis Date  . Myocardial infarction 1997  . Coronary artery disease   . Hypertension   . Arthritis   . Problem with literacy   . Shortness of breath   . Anxiety   . Bronchitis   . Chronic headaches     Past Surgical History  Procedure Date  . Ectopic pregnancy surgery     right    Family History  Problem Relation Age of Onset  . Cancer Mother 23    lung ca  . Diabetes Mother   . Anxiety disorder Sister   . Cancer Brother 73    lung   Social History:  reports that she quit smoking about 18 months ago. She does not have any smokeless tobacco history on file. She reports that she does not drink alcohol or use illicit drugs.  Allergies:  Allergies  Allergen Reactions  . Penicillins Rash     (Not in a hospital admission)  Results for orders placed during the hospital  encounter of 06/27/12 (from the past 48 hour(s))  CBC WITH DIFFERENTIAL     Status: Normal   Collection Time   06/27/12  5:13 PM      Component Value Range Comment   WBC 7.8  4.0 - 10.5 K/uL    RBC 4.73  3.87 - 5.11 MIL/uL    Hemoglobin 13.4  12.0 - 15.0 g/dL    HCT 40.9  81.1 - 91.4 %    MCV 86.0  78.0 - 100.0 fL    MCH 28.3  26.0 - 34.0 pg    MCHC 32.9  30.0 - 36.0 g/dL    RDW 78.2  95.6 - 21.3 %    Platelets 264  150 - 400 K/uL    Neutrophils Relative 55  43 - 77 %    Neutro Abs 4.3  1.7 - 7.7 K/uL    Lymphocytes Relative 33  12 - 46 %    Lymphs Abs 2.6  0.7 - 4.0 K/uL    Monocytes Relative 7  3 - 12 %    Monocytes Absolute 0.5  0.1 - 1.0 K/uL    Eosinophils Relative 5  0 - 5 %    Eosinophils Absolute 0.4  0.0 - 0.7 K/uL    Basophils Relative 1  0 - 1 %    Basophils Absolute 0.1  0.0 - 0.1 K/uL   BASIC METABOLIC PANEL     Status: Abnormal   Collection Time   06/27/12  5:13 PM      Component Value Range Comment   Sodium 141  135 - 145 mEq/L    Potassium 4.2  3.5 - 5.1 mEq/L    Chloride 105  96 - 112 mEq/L    CO2 26  19 - 32 mEq/L    Glucose, Bld 85  70 - 99 mg/dL    BUN 14  6 - 23 mg/dL    Creatinine, Ser 1.61  0.50 - 1.10 mg/dL    Calcium 9.3  8.4 - 09.6 mg/dL    GFR calc non Af Amer 74 (*) >90 mL/min    GFR calc Af Amer 85 (*) >90 mL/min   TROPONIN I     Status: Normal   Collection Time   06/27/12  5:14 PM      Component Value Range Comment   Troponin I <0.30  <0.30 ng/mL   PRO B NATRIURETIC PEPTIDE     Status: Abnormal   Collection Time   06/27/12  5:14 PM      Component Value Range Comment   Pro B Natriuretic peptide (BNP) 156.0 (*) 0 - 125 pg/mL    Dg Chest Port 1 View  06/27/2012  *RADIOLOGY REPORT*  Clinical Data: Shortness of breath.  Cough.  Chest pain.  PORTABLE CHEST - 1 VIEW  Comparison: 06/23/2012  Findings: Low lung volumes are seen however both lungs are clear. Heart size is stable and within normal limits.  No evidence of pleural effusion.  No mass or  lymphadenopathy identified.  IMPRESSION: Low lung volumes.  No acute findings.  Original Report Authenticated By: Danae Orleans, M.D.    Review of Systems  Constitutional: Negative for fever, chills and weight loss.  HENT: Negative for hearing loss.   Eyes: Negative for blurred vision and double vision.  Respiratory: Positive for shortness of breath. Negative for cough, hemoptysis, sputum production and wheezing.   Cardiovascular: Positive for palpitations and orthopnea. Negative for chest pain, claudication, leg swelling and PND.  Gastrointestinal: Negative for heartburn, vomiting and abdominal pain.  Neurological: Negative for dizziness and headaches.    Blood pressure 103/83, pulse 54, temperature 98.2 F (36.8 C), temperature source Oral, resp. rate 11, height 5\' 8"  (1.727 m), weight 100.699 kg (222 lb), SpO2 100.00%. Physical Exam  Constitutional: She is oriented to person, place, and time. She appears well-developed and well-nourished.  HENT:  Head: Normocephalic and atraumatic.  Eyes: Conjunctivae are normal. Pupils are equal, round, and reactive to light. Left eye exhibits no discharge. No scleral icterus.  Neck: Normal range of motion. No JVD present. No tracheal deviation present. No thyromegaly present.  Cardiovascular: Normal rate, regular rhythm and normal heart sounds.  Exam reveals no gallop and no friction rub.   Respiratory: Effort normal and breath sounds normal. No respiratory distress. She has no wheezes. She has no rales. She exhibits no tenderness.  GI: Soft. Bowel sounds are normal. She exhibits no distension. There is no tenderness. There is no rebound and no guarding.  Musculoskeletal: She exhibits edema. She exhibits no tenderness.  Lymphadenopathy:    She has no cervical adenopathy.  Neurological: She is alert and oriented to person, place, and time.     Assessment/Plan Recurrent nonsustained VT asymptomatic with negative nuclear stress test recently with  normal LV systolic function Coronary artery disease history of MI in the past Hypertension Hypercholesteremia Morbid obesity History of tobacco abuse Anxiety disorder History of bronchitis Degenerative joint disease  Plan As per orders  Lareen Mullings N 06/27/2012, 9:04 PM

## 2012-06-28 LAB — CARDIAC PANEL(CRET KIN+CKTOT+MB+TROPI)
CK, MB: 0.8 ng/mL (ref 0.3–4.0)
Relative Index: INVALID (ref 0.0–2.5)
Relative Index: INVALID (ref 0.0–2.5)
Total CK: 43 U/L (ref 7–177)
Total CK: 45 U/L (ref 7–177)
Total CK: 46 U/L (ref 7–177)
Troponin I: <0.3 ng/mL (ref <0.30)

## 2012-06-28 LAB — BASIC METABOLIC PANEL
BUN: 13 mg/dL (ref 6–23)
CO2: 24 mEq/L (ref 19–32)
Chloride: 109 mEq/L (ref 96–112)
Creatinine, Ser: 0.83 mg/dL (ref 0.50–1.10)
GFR calc Af Amer: >90 mL/min (ref >90)
Glucose, Bld: 96 mg/dL (ref 70–99)
Potassium: 4 mEq/L (ref 3.5–5.1)

## 2012-06-28 LAB — COMPREHENSIVE METABOLIC PANEL
AST: 14 U/L (ref 0–37)
Albumin: 3.3 g/dL — ABNORMAL LOW (ref 3.5–5.2)
BUN: 13 mg/dL (ref 6–23)
Calcium: 8.4 mg/dL (ref 8.4–10.5)
Chloride: 108 mEq/L (ref 96–112)
Creatinine, Ser: 0.84 mg/dL (ref 0.50–1.10)
Total Protein: 6.1 g/dL (ref 6.0–8.3)

## 2012-06-28 LAB — DIFFERENTIAL
Basophils Absolute: 0.1 10*3/uL (ref 0.0–0.1)
Basophils Relative: 1 % (ref 0–1)
Eosinophils Relative: 5 % (ref 0–5)
Lymphocytes Relative: 42 % (ref 12–46)
Monocytes Absolute: 0.4 10*3/uL (ref 0.1–1.0)

## 2012-06-28 LAB — LIPID PANEL
HDL: 42 mg/dL (ref >39)
Total CHOL/HDL Ratio: 4.2 RATIO
VLDL: 24 mg/dL (ref 0–40)

## 2012-06-28 LAB — CBC
HCT: 36.4 % (ref 36.0–46.0)
HCT: 36.7 % (ref 36.0–46.0)
Hemoglobin: 11.8 g/dL — ABNORMAL LOW (ref 12.0–15.0)
MCHC: 32.4 g/dL (ref 30.0–36.0)
MCV: 86.5 fL (ref 78.0–100.0)
MCV: 86.8 fL (ref 78.0–100.0)
Platelets: 223 10*3/uL (ref 150–400)
RBC: 4.21 MIL/uL (ref 3.87–5.11)
RDW: 14.8 % (ref 11.5–15.5)
RDW: 14.9 % (ref 11.5–15.5)
WBC: 6.2 10*3/uL (ref 4.0–10.5)
WBC: 6.4 10*3/uL (ref 4.0–10.5)

## 2012-06-28 LAB — TSH: TSH: 26.094 u[IU]/mL — ABNORMAL HIGH (ref 0.350–4.500)

## 2012-06-28 LAB — MAGNESIUM: Magnesium: 1.9 mg/dL (ref 1.5–2.5)

## 2012-06-28 LAB — PROTIME-INR: Prothrombin Time: 13.8 seconds (ref 11.6–15.2)

## 2012-06-28 LAB — APTT: aPTT: 28 seconds (ref 24–37)

## 2012-06-28 NOTE — Progress Notes (Signed)
Pt has been having frequent runs of V-tach non sustained. Pt on Amio drip. Discussed with Dr. Sharyn Lull who agreed to start NS infusion at 75 with electrolyte checks. Labs resulted with slightly low electrolytes(see labs). Discussed with charge wether to call. Will defer to am team for a follow up of electrolyte replacement. Will continue to monitor.

## 2012-06-28 NOTE — Progress Notes (Addendum)
Pt had 12 beat run of Vtach while laying in bed and then a 8 run and a 9 run. Pt reports feeling the palpatations and feeling somewhat light headed. BP 105/71 and HR 57. Notified MD Harwani and ordered to just keep monitoring.

## 2012-06-28 NOTE — ED Provider Notes (Signed)
Please see completed note for this encounter.  Raeford Razor, MD 06/28/12 [phone]

## 2012-06-28 NOTE — Progress Notes (Signed)
Subjective:  Patient denies any chest pain or shortness of breath. States palpitations are much better overall feels better . Had one episode of nonsustained we earlier this morning otherwise frequent bigeminy and trigeminy on the monitor  Objective:  Vital Signs in the last 24 hours: Temp:  [98.2 F (36.8 C)-98.3 F (36.8 C)] 98.3 F (36.8 C) (07/14 0447) Pulse Rate:  [49-102] 55  (07/14 0447) Resp:  [11-18] 18  (07/14 0447) BP: (90-125)/(45-84) 125/70 mmHg (07/14 0941) SpO2:  [97 %-100 %] 99 % (07/14 0447) Weight:  [100.699 kg (222 lb)-110.814 kg (244 lb 4.8 oz)] 110.814 kg (244 lb 4.8 oz) (07/13 2241)  Intake/Output from previous day:   Intake/Output from this shift:    Physical Exam: Neck: no adenopathy, no carotid bruit, no JVD and supple, symmetrical, trachea midline Lungs: clear to auscultation bilaterally Heart: regular rate and rhythm, S1, S2 normal, no murmur, click, rub or gallop Abdomen: soft, non-tender; bowel sounds normal; no masses,  no organomegaly Extremities: extremities normal, atraumatic, no cyanosis or edema  Lab Results:  Basename 06/28/12 0500 06/27/12 2330  WBC 6.2 6.4  HGB 11.8* 11.9*  PLT 224 223    Basename 06/28/12 0500 06/27/12 2330  NA 142 140  K 4.0 3.8  CL 109 108  CO2 24 25  GLUCOSE 96 123*  BUN 13 13  CREATININE 0.83 0.84    Basename 06/28/12 0550 06/27/12 2318  TROPONINI <0.30 <0.30   Hepatic Function Panel  Basename 06/27/12 2330  PROT 6.1  ALBUMIN 3.3*  AST 14  ALT 12  ALKPHOS 72  BILITOT 0.2*  BILIDIR --  IBILI --    Basename 06/28/12 0500  CHOL 177   No results found for this basename: PROTIME in the last 72 hours  Imaging: Imaging results have been reviewed and Dg Chest Port 1 View  06/27/2012  *RADIOLOGY REPORT*  Clinical Data: Shortness of breath.  Cough.  Chest pain.  PORTABLE CHEST - 1 VIEW  Comparison: 06/23/2012  Findings: Low lung volumes are seen however both lungs are clear. Heart size is stable and  within normal limits.  No evidence of pleural effusion.  No mass or lymphadenopathy identified.  IMPRESSION: Low lung volumes.  No acute findings.  Original Report Authenticated By: Danae Orleans, M.D.    Cardiac Studies:  Assessment/Plan:  Recurrent nonsustained VT asymptomatic with negative nuclear stress test recently with normal LV systolic function  Coronary artery disease history of MI in the past  Hypertension  Hypercholesteremia  Morbid obesity  History of tobacco abuse  Anxiety disorder  History of bronchitis  Degenerative joint disease  Plan Continue present management Increase via blockers as blood pressure tolerates. Further evaluation and treatment as per her Dr. Algie Coffer  LOS: 1 day    Robynn Pane 06/28/2012, 10:19 AM

## 2012-06-29 ENCOUNTER — Ambulatory Visit: Payer: Self-pay | Admitting: Family Medicine

## 2012-06-29 MED ORDER — LEVOTHYROXINE SODIUM 50 MCG PO TABS
50.0000 ug | ORAL_TABLET | Freq: Every day | ORAL | Status: DC
  Administered 2012-06-29: 50 ug via ORAL
  Filled 2012-06-29 (×2): qty 1

## 2012-06-29 MED ORDER — METOPROLOL TARTRATE 25 MG PO TABS
25.0000 mg | ORAL_TABLET | Freq: Four times a day (QID) | ORAL | Status: DC
  Administered 2012-06-29 – 2012-06-30 (×4): 25 mg via ORAL
  Filled 2012-06-29 (×7): qty 1

## 2012-06-29 NOTE — Progress Notes (Signed)
Subjective:  Excess caffeine intake as coffee and sodas daily. Used to eat extra chocolate before.  Objective:  Vital Signs in the last 24 hours: Temp:  [97.3 F (36.3 C)-98.3 F (36.8 C)] 97.8 F (36.6 C) (07/15 1353) Pulse Rate:  [56-69] 59  (07/15 1353) Cardiac Rhythm:  [-] Sinus bradycardia;Ventricular tachycardia (07/15 0842) Resp:  [18] 18  (07/15 1353) BP: (98-144)/(60-72) 103/67 mmHg (07/15 1353) SpO2:  [97 %-98 %] 97 % (07/15 1353)  Physical Exam: BP Readings from Last 1 Encounters:  06/29/12 103/67    Wt Readings from Last 1 Encounters:  06/27/12 110.814 kg (244 lb 4.8 oz)    Weight change:   HEENT: [place]/AT, Eyes-Light blue, PERL, EOMI, Conjunctiva-Pink, Sclera-Non-icteric Neck: No JVD, No bruit, Trachea midline. Lungs:  Clear, Bilateral. Cardiac:  Regular rhythm, normal S1 and S2, no S3.  Abdomen:  Soft, non-tender, distended. Extremities:  No edema present. No cyanosis. No clubbing. CNS: AxOx3, Cranial nerves grossly intact, moves all 4 extremities. Right handed. Skin: Warm and dry.   Intake/Output from previous day:      Lab Results: BMET    Component Value Date/Time   NA 142 06/28/2012 0500   K 4.0 06/28/2012 0500   CL 109 06/28/2012 0500   CO2 24 06/28/2012 0500   GLUCOSE 96 06/28/2012 0500   BUN 13 06/28/2012 0500   CREATININE 0.83 06/28/2012 0500   CREATININE 0.96 12/18/2011 1511   CALCIUM 8.3* 06/28/2012 0500   GFRNONAA 78* 06/28/2012 0500   GFRAA >90 06/28/2012 0500   CBC    Component Value Date/Time   WBC 6.2 06/28/2012 0500   RBC 4.21 06/28/2012 0500   HGB 11.8* 06/28/2012 0500   HCT 36.4 06/28/2012 0500   PLT 224 06/28/2012 0500   MCV 86.5 06/28/2012 0500   MCH 28.0 06/28/2012 0500   MCHC 32.4 06/28/2012 0500   RDW 14.9 06/28/2012 0500   LYMPHSABS 2.7 06/27/2012 2330   MONOABS 0.4 06/27/2012 2330   EOSABS 0.3 06/27/2012 2330   BASOSABS 0.1 06/27/2012 2330   CARDIAC ENZYMES Lab Results  Component Value Date   CKTOTAL 46 06/28/2012   CKMB 1.1 06/28/2012    TROPONINI <0.30 06/28/2012    Assessment/Plan:  Patient Active Hospital Problem List: Recurrent nonsustained VT asymptomatic Coronary artery disease history of MI in the past  Hypertension  Hypercholesteremia  Morbid obesity  History of tobacco abuse  Anxiety disorder  History of bronchitis  Degenerative joint disease  History of excess caffeine intake Hypothyroidism  Start synthroid 50 mcg. DC Amiodarone Patient promises to giving up caffeine from today.   LOS: 2 days    Orpah Cobb  MD  06/29/2012, 6:43 PM

## 2012-06-29 NOTE — Progress Notes (Signed)
UR Completed.  [name] [phone] 06/29/2012  

## 2012-06-30 MED ORDER — LEVOTHYROXINE SODIUM 50 MCG PO TABS: 50.0000 ug | ORAL_TABLET | Freq: Every day | ORAL | Status: DC

## 2012-06-30 NOTE — Progress Notes (Signed)
Frequent runs of VT on monitor, 5-9 beats.  Patient asymptomatic.  Will continue to monitor.  Alonza Bogus

## 2012-06-30 NOTE — Progress Notes (Signed)
Called Dr. Algie Coffer about pt episodes of v-tach.  Pt checked at 09:40 for non-sustained v-tach.  Pt asymptomatic 108/67 and heart rate in 70's. Pt OK for discharge.  Pt anxious to be discharged. Thomas Hoff

## 2012-06-30 NOTE — Discharge Summary (Signed)
Physician Discharge Summary  Patient ID: [name] MRN: [id number] DOB/AGE: Mar 27, 1956 56 y.o.  Admit date: 06/27/2012 Discharge date: 06/30/2012  Admission Diagnoses: Recurrent nonsustained VT asymptomatic  Coronary artery disease history of MI in the past  Hypertension  Hypercholesteremia  Morbid obesity  History of tobacco abuse  Anxiety disorder  History of bronchitis  Degenerative joint disease    Discharge Diagnoses:  Principle Problem:  *  Recurrent nonsustained VT asymptomatic*  Coronary artery disease  History of MI in the past  Hypertension  Hypercholesteremia  Morbid obesity  History of tobacco abuse  Anxiety disorder  History of bronchitis  Degenerative joint disease  History of excess caffeine intake  Hypothyroidism  Discharged Condition: good  Hospital Course: 56 years old female with excess caffeine intake from coffee and sodas has palpitations from recurrent VPCs, Bigeminy and non sustained VT. No dizziness or syncope.  She responded to holding caffeine intake and resuming B-blocker. Amiodarone was discontinued due to elevated TSH. She was discharged home in satisfactory condition.  Consults: None  Significant Diagnostic Studies: labs: Normal cardiac enzymes, cbc and electrolytes. Elevated TSH of 26.094.  Treatments: cardiac meds: Metoprolol and amiodarone. Synthroid for hypothyroidism.  Discharge Exam: Blood pressure 126/84, pulse 64, temperature 98.6 F (37 C), temperature source Oral, resp. rate 18, height 5\' 8"  (1.727 m), weight 110.814 kg (244 lb 4.8 oz), SpO2 97.00%. HEENT: [place]/AT, Eyes-Light blue, PERL, EOMI, Conjunctiva-Pink, Sclera-Non-icteric  Neck: No JVD, No bruit, Trachea midline.  Lungs: Clear, Bilateral.  Cardiac: Regular rhythm, normal S1 and S2, no S3.  Abdomen: Soft, non-tender, distended.  Extremities: No edema present. No cyanosis. No clubbing.  CNS: AxOx3, Cranial nerves grossly intact, moves all 4 extremities. Right  handed.  Skin: Warm and dry.   Disposition: 01-Home or Self Care   Medication List  As of 06/30/2012  8:39 AM   STOP taking these medications         naproxen 500 MG tablet         TAKE these medications         albuterol 108 (90 BASE) MCG/ACT inhaler   Commonly known as: PROVENTIL HFA;VENTOLIN HFA   Inhale 2 puffs into the lungs every 6 (six) hours as needed. For wheezing or shortness of breath      aspirin EC 81 MG tablet   Take 81 mg by mouth every morning.      atorvastatin 10 MG tablet   Commonly known as: LIPITOR   Take 10 mg by mouth every morning.      diphenhydramine-acetaminophen 25-500 MG Tabs   Commonly known as: TYLENOL PM   Take 1 tablet by mouth at bedtime as needed. For back & arm pain      levothyroxine 50 MCG tablet   Commonly known as: SYNTHROID, LEVOTHROID   Take 1 tablet (50 mcg total) by mouth daily before breakfast.      metoprolol succinate 50 MG 24 hr tablet   Commonly known as: TOPROL-XL   Take 50 mg by mouth daily. Take with or immediately following a meal.      nitroGLYCERIN 0.4 MG SL tablet   Commonly known as: NITROSTAT   Place 1 tablet (0.4 mg total) under the tongue every 5 (five) minutes x 3 doses as needed for chest pain.           Follow-up Information    Follow up with [facility], MD. Schedule an appointment as soon as possible for a visit in 1 week.  Contact information:   [address] [place] [address] [phone]          Signed: Ricki Rodriguez 06/30/2012, 8:39 AM

## 2012-07-05 ENCOUNTER — Emergency Department (HOSPITAL_COMMUNITY)
Admission: EM | Admit: 2012-07-05 | Discharge: 2012-07-06 | Disposition: A | Discharge status: Home / Self Care | Payer: Medicaid Other | Attending: Emergency Medicine | Admitting: Emergency Medicine

## 2012-07-05 DIAGNOSIS — I10 Essential (primary) hypertension: Secondary | ICD-10-CM | POA: Insufficient documentation

## 2012-07-05 DIAGNOSIS — I2510 Atherosclerotic heart disease of native coronary artery without angina pectoris: Secondary | ICD-10-CM | POA: Insufficient documentation

## 2012-07-05 DIAGNOSIS — F411 Generalized anxiety disorder: Secondary | ICD-10-CM | POA: Insufficient documentation

## 2012-07-05 DIAGNOSIS — Z8739 Personal history of other diseases of the musculoskeletal system and connective tissue: Secondary | ICD-10-CM | POA: Insufficient documentation

## 2012-07-05 DIAGNOSIS — Z79899 Other long term (current) drug therapy: Secondary | ICD-10-CM | POA: Insufficient documentation

## 2012-07-05 DIAGNOSIS — I252 Old myocardial infarction: Secondary | ICD-10-CM | POA: Insufficient documentation

## 2012-07-05 DIAGNOSIS — Z7982 Long term (current) use of aspirin: Secondary | ICD-10-CM | POA: Insufficient documentation

## 2012-07-05 DIAGNOSIS — Z87891 Personal history of nicotine dependence: Secondary | ICD-10-CM | POA: Insufficient documentation

## 2012-07-05 DIAGNOSIS — R079 Chest pain, unspecified: Secondary | ICD-10-CM

## 2012-07-06 ENCOUNTER — Emergency Department (HOSPITAL_COMMUNITY): Payer: Medicaid Other

## 2012-07-06 ENCOUNTER — Ambulatory Visit
Admission: RE | Admit: 2012-07-06 | Discharge: 2012-07-06 | Disposition: A | Discharge status: Home / Self Care | Payer: Self-pay | Source: Ambulatory Visit | Attending: Cardiovascular Disease | Admitting: Cardiovascular Disease

## 2012-07-06 ENCOUNTER — Other Ambulatory Visit: Payer: Self-pay | Admitting: Cardiovascular Disease

## 2012-07-06 DIAGNOSIS — R52 Pain, unspecified: Secondary | ICD-10-CM

## 2012-07-06 LAB — CBC WITH DIFFERENTIAL/PLATELET
Basophils Absolute: 0.1 10*3/uL (ref 0.0–0.1)
Basophils Relative: 1 % (ref 0–1)
Eosinophils Absolute: 0.3 10*3/uL (ref 0.0–0.7)
Eosinophils Relative: 4 % (ref 0–5)
HCT: 40.1 % (ref 36.0–46.0)
Hemoglobin: 13.5 g/dL (ref 12.0–15.0)
Lymphocytes Relative: 38 % (ref 12–46)
Lymphs Abs: 2.5 10*3/uL (ref 0.7–4.0)
MCH: 28.4 pg (ref 26.0–34.0)
MCHC: 33.7 g/dL (ref 30.0–36.0)
MCV: 84.4 fL (ref 78.0–100.0)
Monocytes Absolute: 0.5 K/uL (ref 0.1–1.0)
Monocytes Relative: 8 % (ref 3–12)
Neutro Abs: 3.2 10*3/uL (ref 1.7–7.7)
Neutrophils Relative %: 49 % (ref 43–77)
Platelets: 252 10*3/uL (ref 150–400)
RBC: 4.75 MIL/uL (ref 3.87–5.11)
RDW: 14.5 % (ref 11.5–15.5)
WBC: 6.6 K/uL (ref 4.0–10.5)

## 2012-07-06 LAB — BASIC METABOLIC PANEL
CO2: 23 mEq/L (ref 19–32)
GFR calc non Af Amer: >90 mL/min (ref >90)
Glucose, Bld: 86 mg/dL (ref 70–99)
Potassium: 3.7 mEq/L (ref 3.5–5.1)
Sodium: 136 mEq/L (ref 135–145)

## 2012-07-06 LAB — CK: Total CK: 63 U/L (ref 7–177)

## 2012-07-06 LAB — BASIC METABOLIC PANEL WITH GFR
BUN: 14 mg/dL (ref 6–23)
Calcium: 9.1 mg/dL (ref 8.4–10.5)
Chloride: 102 meq/L (ref 96–112)
Creatinine, Ser: 0.77 mg/dL (ref 0.50–1.10)
GFR calc Af Amer: >90 mL/min (ref >90)

## 2012-07-06 LAB — D-DIMER, QUANTITATIVE: D-Dimer, Quant: 0.33 ug{FEU}/mL (ref 0.00–0.48)

## 2012-07-06 MED ORDER — HYDROCODONE-ACETAMINOPHEN 5-500 MG PO TABS: 1.0000 | ORAL_TABLET | Freq: Four times a day (QID) | ORAL | Status: AC | PRN

## 2012-07-06 NOTE — ED Notes (Signed)
Report received from Night RN. First contact with pt. Pt denied pain or any other needs, pt up for discharge. Will discharge pt

## 2012-07-06 NOTE — ED Notes (Signed)
[id number]<AV> Expected date:<BR> Expected time:<BR> Means of arrival:<BR> Comments:<BR> Downtime, Lockie Pares

## 2012-07-06 NOTE — ED Provider Notes (Signed)
History     CSN: [id number]  Arrival date & time 07/05/12  2119   None     No chief complaint on file.   (Consider location/radiation/quality/duration/timing/severity/associated sxs/prior treatment) HPI HX per PT. CP for the last week on and off with palpitations. No F/C, no n/v/d. No new leg swelling, no leg pain. Pain is R sided sharp in quality and occasionaly radiates to L arm.  Some SOB none at this time. Pain inc with cough. No alleviating factors. Recent admit a week ago for CP and started on thyroid medication. Mod in severity Past Medical History  Diagnosis Date  . Myocardial infarction 1997  . Coronary artery disease   . Hypertension   . Arthritis   . Problem with literacy   . Shortness of breath   . Anxiety   . Bronchitis   . Chronic headaches     Past Surgical History  Procedure Date  . Ectopic pregnancy surgery     right    Family History  Problem Relation Age of Onset  . Cancer Mother 23    lung ca  . Diabetes Mother   . Anxiety disorder Sister   . Cancer Brother 60    lung    History  Substance Use Topics  . Smoking status: Former Smoker -- 1.0 packs/day for 3 years    Quit date: 12/03/2010  . Smokeless tobacco: Not on file  . Alcohol Use: No    OB History    Grav Para Term Preterm Abortions TAB SAB Ect Mult Living                  Review of Systems  Constitutional: Negative for fever and chills.  HENT: Negative for neck pain and neck stiffness.   Eyes: Negative for pain.  Respiratory: Positive for cough.   Cardiovascular: Positive for chest pain.  Gastrointestinal: Negative for nausea, vomiting and abdominal pain.  Genitourinary: Negative for dysuria.  Musculoskeletal: Negative for back pain.  Skin: Negative for rash.  Neurological: Negative for headaches.  All other systems reviewed and are negative.    Allergies  Penicillins  Home Medications   Current Outpatient Rx  Name Route Sig Dispense Refill  . ASPIRIN EC 81 MG PO  TBEC Oral Take 81 mg by mouth every morning.   2  . ATORVASTATIN CALCIUM 10 MG PO TABS Oral Take 10 mg by mouth every morning.     Marland Kitchen DIPHENHYDRAMINE-APAP (SLEEP) 25-500 MG PO TABS Oral Take 1 tablet by mouth at bedtime as needed. For back & arm pain    . LEVOTHYROXINE SODIUM 50 MCG PO TABS Oral Take 1 tablet (50 mcg total) by mouth daily before breakfast. 30 tablet 1  . METOPROLOL SUCCINATE ER 50 MG PO TB24 Oral Take 50 mg by mouth daily. Take with or immediately following a meal.    . ALBUTEROL SULFATE HFA 108 (90 BASE) MCG/ACT IN AERS Inhalation Inhale 2 puffs into the lungs every 6 (six) hours as needed. For wheezing or shortness of breath    . NITROGLYCERIN 0.4 MG SL SUBL Sublingual Place 1 tablet (0.4 mg total) under the tongue every 5 (five) minutes x 3 doses as needed for chest pain. 25 tablet 1    BP 102/64  Pulse 53  Resp 20  SpO2 97%  Physical Exam  Constitutional: She is oriented to person, place, and time. She appears well-developed and well-nourished.  HENT:  Head: Normocephalic and atraumatic.  Eyes: Conjunctivae and EOM are normal.  Pupils are equal, round, and reactive to light.  Neck: Trachea normal. Neck supple. No thyromegaly present.  Cardiovascular: Normal rate, regular rhythm, S1 normal, S2 normal and normal pulses.     No systolic murmur is present   No diastolic murmur is present  Pulses:      Radial pulses are 2+ on the right side, and 2+ on the left side.  Pulmonary/Chest: Effort normal and breath sounds normal. She has no wheezes. She has no rhonchi. She has no rales. She exhibits no tenderness.  Abdominal: Soft. Normal appearance and bowel sounds are normal. There is no tenderness. There is no CVA tenderness and negative Murphy's sign.  Musculoskeletal:       BLE:s 1 plus symetric pretibial edema, Calves nontender, no cords or erythema, negative Homans sign  Neurological: She is alert and oriented to person, place, and time. She has normal strength. No cranial  nerve deficit or sensory deficit. GCS eye subscore is 4. GCS verbal subscore is 5. GCS motor subscore is 6.  Skin: Skin is warm and dry. No rash noted. She is not diaphoretic.  Psychiatric: Her speech is normal.       Cooperative and appropriate    ED Course  Procedures (including critical care time)   Date: 07/06/2012  Rate: 50  Rhythm: sinus bradycardia  QRS Axis: normal  Intervals: normal  ST/T Wave abnormalities: nonspecific ST changes  Conduction Disutrbances:none  Narrative Interpretation: low volt  Old EKG Reviewed: unchanged  Labs reviewed. Normal cardiac enzymes, CBC, BMP and neg d-dimer. CXR reviewed- NAD  1. Chest pain    ASA. NTG. Morphine with pain control.  At 0423 has run of nonsustained V-tach 9 beats. Noted to have h/o same. Asymptomatic during event  CAR consult - 6:54 AM d/w Dr Sharyn Lull - he saw PT in the hospital last week and recs that she can be discharged home and f/u DR Algie Coffer this week. Does not need admission or further ED work up at this time.     MDM   Labs, imaging, IV narcotics, old records reviewed. Nursing notes reviewed. Vital signs reviewed.     Sunnie Nielsen, MD 07/06/12 [phone]

## 2012-07-07 MED FILL — Ondansetron HCl Inj 4 MG/2ML (2 MG/ML): INTRAMUSCULAR | Qty: 2 | Status: AC

## 2012-07-07 MED FILL — Morphine Sulfate Inj 4 MG/ML: INTRAMUSCULAR | Qty: 1 | Status: AC

## 2012-07-14 ENCOUNTER — Emergency Department (HOSPITAL_COMMUNITY): Payer: Medicaid Other

## 2012-07-14 ENCOUNTER — Encounter (HOSPITAL_COMMUNITY): Payer: Self-pay

## 2012-07-14 ENCOUNTER — Emergency Department (HOSPITAL_COMMUNITY)
Admission: EM | Admit: 2012-07-14 | Discharge: 2012-07-14 | Disposition: A | Discharge status: Home / Self Care | Payer: Medicaid Other | Attending: Emergency Medicine | Admitting: Emergency Medicine

## 2012-07-14 DIAGNOSIS — I2510 Atherosclerotic heart disease of native coronary artery without angina pectoris: Secondary | ICD-10-CM | POA: Insufficient documentation

## 2012-07-14 DIAGNOSIS — Z87891 Personal history of nicotine dependence: Secondary | ICD-10-CM | POA: Insufficient documentation

## 2012-07-14 DIAGNOSIS — Z79899 Other long term (current) drug therapy: Secondary | ICD-10-CM | POA: Insufficient documentation

## 2012-07-14 DIAGNOSIS — I10 Essential (primary) hypertension: Secondary | ICD-10-CM | POA: Insufficient documentation

## 2012-07-14 DIAGNOSIS — R002 Palpitations: Secondary | ICD-10-CM

## 2012-07-14 DIAGNOSIS — Z8739 Personal history of other diseases of the musculoskeletal system and connective tissue: Secondary | ICD-10-CM | POA: Insufficient documentation

## 2012-07-14 DIAGNOSIS — Z7982 Long term (current) use of aspirin: Secondary | ICD-10-CM | POA: Insufficient documentation

## 2012-07-14 DIAGNOSIS — I252 Old myocardial infarction: Secondary | ICD-10-CM | POA: Insufficient documentation

## 2012-07-14 LAB — CBC WITH DIFFERENTIAL/PLATELET
Basophils Relative: 1 % (ref 0–1)
Eosinophils Absolute: 0.4 10*3/uL (ref 0.0–0.7)
HCT: 41.1 % (ref 36.0–46.0)
Hemoglobin: 13.5 g/dL (ref 12.0–15.0)
MCH: 28.2 pg (ref 26.0–34.0)
MCHC: 32.8 g/dL (ref 30.0–36.0)
Monocytes Absolute: 0.5 10*3/uL (ref 0.1–1.0)
Monocytes Relative: 7 % (ref 3–12)
RDW: 14.6 % (ref 11.5–15.5)

## 2012-07-14 LAB — POCT I-STAT, CHEM 8
Calcium, Ion: 1.19 mmol/L (ref 1.12–1.23)
Glucose, Bld: 86 mg/dL (ref 70–99)
HCT: 43 % (ref 36.0–46.0)
Hemoglobin: 14.6 g/dL (ref 12.0–15.0)
TCO2: 22 mmol/L (ref 0–100)

## 2012-07-14 LAB — POCT I-STAT TROPONIN I: Troponin i, poc: 0 ng/mL (ref 0.00–0.08)

## 2012-07-14 MED ORDER — METOPROLOL TARTRATE 25 MG PO TABS: 25.0000 mg | ORAL_TABLET | Freq: Two times a day (BID) | ORAL | Status: DC

## 2012-07-14 NOTE — ED Notes (Signed)
Pt brought back to room placed on monitor and on zoll pads, IV established. Pt aware of poc.

## 2012-07-14 NOTE — ED Provider Notes (Signed)
History     CSN: [id number]  Arrival date & time 07/14/12  1429   First MD Initiated Contact with Patient 07/14/12 1444      Chief Complaint  Patient presents with  . Chest Pain    (Consider location/radiation/quality/duration/timing/severity/associated sxs/prior treatment) Patient is a 56 y.o. female presenting with palpitations. The history is provided by the patient and medical records.  Palpitations  This is a recurrent problem. The current episode started yesterday. Episode frequency: several times daily. The problem has not changed since onset.The problem is associated with an unknown factor. Associated symptoms include chest pain, irregular heartbeat and shortness of breath. Pertinent negatives include no diaphoresis, no fever, no chest pressure, no exertional chest pressure, no near-syncope, no abdominal pain, no nausea, no vomiting, no headaches, no back pain, no lower extremity edema, no dizziness and no cough. She has tried nothing for the symptoms. Risk factors include obesity. Her past medical history does not include anemia, hyperthyroidism or valve disorder.    Past Medical History  Diagnosis Date  . Myocardial infarction 1997  . Coronary artery disease   . Hypertension   . Arthritis   . Problem with literacy   . Shortness of breath   . Anxiety   . Bronchitis   . Chronic headaches     Past Surgical History  Procedure Date  . Ectopic pregnancy surgery     right    Family History  Problem Relation Age of Onset  . Cancer Mother 65    lung ca  . Diabetes Mother   . Anxiety disorder Sister   . Cancer Brother 60    lung    History  Substance Use Topics  . Smoking status: Former Smoker -- 1.0 packs/day for 3 years    Quit date: 12/03/2010  . Smokeless tobacco: Not on file  . Alcohol Use: No    OB History    Grav Para Term Preterm Abortions TAB SAB Ect Mult Living                  Review of Systems  Constitutional: Negative for fever, chills and  diaphoresis.  Respiratory: Positive for shortness of breath. Negative for cough.   Cardiovascular: Positive for chest pain and palpitations. Negative for leg swelling and near-syncope.  Gastrointestinal: Negative for nausea, vomiting and abdominal pain.  Musculoskeletal: Negative for back pain.  Skin: Negative for color change and rash.  Neurological: Negative for dizziness, syncope, light-headedness and headaches.  All other systems reviewed and are negative.    Allergies  Penicillins  Home Medications   Current Outpatient Rx  Name Route Sig Dispense Refill  . ASPIRIN EC 81 MG PO TBEC Oral Take 81 mg by mouth every morning.   2  . ATORVASTATIN CALCIUM 10 MG PO TABS Oral Take 10 mg by mouth every morning.     Marland Kitchen DIPHENHYDRAMINE-APAP (SLEEP) 25-500 MG PO TABS Oral Take 1 tablet by mouth at bedtime as needed. For back & arm pain    . HYDROCODONE-ACETAMINOPHEN 5-500 MG PO TABS Oral Take 1-2 tablets by mouth every 6 (six) hours as needed for pain. 15 tablet 0  . LEVOTHYROXINE SODIUM 50 MCG PO TABS Oral Take 1 tablet (50 mcg total) by mouth daily before breakfast. 30 tablet 1  . NITROGLYCERIN 0.4 MG SL SUBL Sublingual Place 1 tablet (0.4 mg total) under the tongue every 5 (five) minutes x 3 doses as needed for chest pain. 25 tablet 1  . ALBUTEROL SULFATE HFA 108 (  90 BASE) MCG/ACT IN AERS Inhalation Inhale 2 puffs into the lungs every 6 (six) hours as needed. For wheezing or shortness of breath    . METOPROLOL SUCCINATE ER 50 MG PO TB24 Oral Take 50 mg by mouth daily. Take with or immediately following a meal.      BP 108/64  Pulse 69  Temp 97.8 F (36.6 C) (Oral)  Resp 18  SpO2 97%  Physical Exam  Nursing note and vitals reviewed. Constitutional: She is oriented to person, place, and time. She appears well-developed and well-nourished.  HENT:  Head: Normocephalic and atraumatic.  Eyes: Pupils are equal, round, and reactive to light.  Cardiovascular: Normal rate, normal heart sounds  and intact distal pulses.  An irregular rhythm present.  Pulmonary/Chest: Effort normal and breath sounds normal. No respiratory distress.  Abdominal: Soft. Bowel sounds are normal. She exhibits no distension. There is no tenderness.  Musculoskeletal: She exhibits no edema.  Neurological: She is alert and oriented to person, place, and time.  Skin: Skin is warm and dry.  Psychiatric: She has a normal mood and affect.    ED Course  Procedures (including critical care time)   Labs Reviewed  CBC WITH DIFFERENTIAL  POCT I-STAT, CHEM 8  POCT I-STAT TROPONIN I   Dg Chest Portable 1 View  07/14/2012  *RADIOLOGY REPORT*  Clinical Data: Chest pain and hypertension.  PORTABLE CHEST - 1 VIEW  Comparison: 07/06/2012  Findings: 1500 hours.  Lung volumes are low. The lungs are clear without focal infiltrate, edema, pneumothorax or pleural effusion. The cardiopericardial silhouette is enlarged. Telemetry leads overlie the chest.  IMPRESSION: Stable.  Cardiomegaly without acute cardiopulmonary process.  Original Report Authenticated By: ERIC A. MANSELL, M.D.    Date: 07/14/2012  Rate: 85  Rhythm: normal sinus rhythm and ventricular tachycardia NSR, run of 4 beats of VT  QRS Axis: normal  Intervals: QT prolonged QTc = 546 ms  ST/T Wave abnormalities: normal  Conduction Disutrbances:none  Narrative Interpretation:   Old EKG Reviewed: unchanged 07/06/12    1. Palpitations       MDM  This is a 56 year old female who presents today with intermittent palpitations. She states it started last night, and has happened several times during the day today. It lasts for 1-5 minutes, and is associated with chest discomfort and shortness of breath.She has experienced this multiple times recently, and has been seen and admitted for this twice this month. She had a stress test in June that showed no inducible ischemia, as well as normal LV function, and had a cardiac catheterization in October of 2012 that did  not show any disease. When she first presented with the symptoms, she was started on amiodarone, however this was stopped on her last admission due to elevated TSH. She has been free of palpitations from the time of her last admission in the middle of the month up until last night. She has cut out caffeine, does not smoke, has no recent infectious symptoms, denies any new medications, herbal supplements, or any other stimulants. Her exam is unremarkable. During conversation she is noted to have several runs of V. tach, ranging from 3-8 beats, but the patient does not feel most of these runs. She was only able to feel one run of V. tach during the conversation, and her symptoms lasted for less than a minute. The patient's lab work and EKG are unremarkable. Discussed the patient with Dr. Derryl Harbor, who recommends increasing the patient's metoprolol dose but does not  think that with her recent negative workup in with her not feeling most of the episodes, that she requires any further evaluation at this point in time. Discussed with the patient taking her metoprolol, but says that she has not taken it in several months and she is unable to afford it. We'll switch the patient to short acting metoprolol, and have her take an equivalent dose twice a day, as this medication is on the four-hour. Discussed with the patient the importance of taking his medication as prescribed to help decrease her palpitations, as well as followup with her cardiologist. The patient expresses understanding with this plan.       Theotis Burrow, MD 07/14/12 [phone]

## 2012-07-14 NOTE — ED Notes (Signed)
Pt complains of chest pain onset last night sts feels like skipping beats.

## 2012-07-15 NOTE — ED Provider Notes (Signed)
I saw and evaluated the patient, reviewed the resident's note and I agree with the findings and plan and agree with their ECG interpretation. Runs of vtach. Recent admission for same. Has not been taking her betablocker due to cost. D/w her cardiologist. Will follow in office patient is asymptomatic with the episodes  Juliet Rude. Rubin Payor, MD 07/15/12 [phone]

## 2012-07-18 ENCOUNTER — Encounter (HOSPITAL_COMMUNITY): Payer: Self-pay | Admitting: *Deleted

## 2012-07-18 ENCOUNTER — Emergency Department (HOSPITAL_COMMUNITY)
Admission: EM | Admit: 2012-07-18 | Discharge: 2012-07-18 | Disposition: A | Discharge status: Home / Self Care | Payer: Medicaid Other | Attending: Emergency Medicine | Admitting: Emergency Medicine

## 2012-07-18 DIAGNOSIS — S93401A Sprain of unspecified ligament of right ankle, initial encounter: Secondary | ICD-10-CM

## 2012-07-18 DIAGNOSIS — J45901 Unspecified asthma with (acute) exacerbation: Secondary | ICD-10-CM

## 2012-07-18 DIAGNOSIS — S93409A Sprain of unspecified ligament of unspecified ankle, initial encounter: Secondary | ICD-10-CM | POA: Insufficient documentation

## 2012-07-18 DIAGNOSIS — I252 Old myocardial infarction: Secondary | ICD-10-CM | POA: Insufficient documentation

## 2012-07-18 DIAGNOSIS — W19XXXA Unspecified fall, initial encounter: Secondary | ICD-10-CM | POA: Insufficient documentation

## 2012-07-18 DIAGNOSIS — I10 Essential (primary) hypertension: Secondary | ICD-10-CM | POA: Insufficient documentation

## 2012-07-18 DIAGNOSIS — Z79899 Other long term (current) drug therapy: Secondary | ICD-10-CM | POA: Insufficient documentation

## 2012-07-18 MED ORDER — PREDNISONE 20 MG PO TABS: ORAL_TABLET | ORAL | Status: DC

## 2012-07-18 MED ORDER — ALBUTEROL SULFATE HFA 108 (90 BASE) MCG/ACT IN AERS: 2.0000 | INHALATION_SPRAY | RESPIRATORY_TRACT | Status: DC | PRN

## 2012-07-18 NOTE — Progress Notes (Signed)
Orthopedic Tech Progress Note Patient Details:  [name] 07/01/1956 [id number]  Ortho Devices Type of Ortho Device: ASO Ortho Device/Splint Location: right LE Ortho Device/Splint Interventions: Application   Aveena Bari T 07/18/2012, 3:15 PM

## 2012-07-18 NOTE — ED Notes (Signed)
PT reported to EMS that she needed a refill on her Nebs . She has been out of them for 2 months. Pt also reported she fell  2 days ago and now has ankle pain. On arrive PT was on room air and laughing and talking with  Friend that road in the EMS truck with her. Pt ambulatory from stretcher to FT bed. Pt in no acute resp. Distress note on arrival to FT.

## 2012-07-18 NOTE — ED Provider Notes (Signed)
History   This chart was scribed for Hurman Horn, MD by Melba Coon. The patient was seen in room TR10C/TR10C and the patient's care was started at 2:50PM.    CSN: [id number]  Arrival date & time 07/18/12  1333   None     Chief Complaint  Patient presents with  . Medication Refill    PT has been out of  nebs    (Consider location/radiation/quality/duration/timing/severity/associated sxs/prior treatment) HPI [name] is a 56 y.o. female who EMS presents to the Emergency Department complaining of persistent, moderate cough, SOB and wheezing for a few weeks. Pt is here to refill her nebulizer medication that has been empty for 2 months. Pt also reports ankle pain pertaining to a fall about 2 days ago, but pt is in NAD. Pt used to smoke but quit about 1.5 years ago. Cough-induced chest pain present. No HA, fever, neck pain, sore throat, rash, back pain, abd pain, n/v/d, dysuria, or extremity edema, weakness, numbness, or tingling. Allergic to penicllins. No other pertinent medical symptoms.  Past Medical History  Diagnosis Date  . Myocardial infarction 1997  . Coronary artery disease   . Hypertension   . Arthritis   . Problem with literacy   . Shortness of breath   . Anxiety   . Bronchitis   . Chronic headaches     Past Surgical History  Procedure Date  . Ectopic pregnancy surgery     right    Family History  Problem Relation Age of Onset  . Cancer Mother 15    lung ca  . Diabetes Mother   . Anxiety disorder Sister   . Cancer Brother 60    lung    History  Substance Use Topics  . Smoking status: Former Smoker -- 1.0 packs/day for 3 years    Quit date: 12/03/2010  . Smokeless tobacco: Not on file  . Alcohol Use: No    OB History    Grav Para Term Preterm Abortions TAB SAB Ect Mult Living                  Review of Systems 10 Systems reviewed and all are negative for acute change except as noted in the HPI.   Allergies  Penicillins  Home  Medications   Current Outpatient Rx  Name Route Sig Dispense Refill  . ASPIRIN EC 81 MG PO TBEC Oral Take 81 mg by mouth every morning.   2  . LEVOTHYROXINE SODIUM 50 MCG PO TABS Oral Take 1 tablet (50 mcg total) by mouth daily before breakfast. 30 tablet 1  . METOPROLOL TARTRATE 25 MG PO TABS Oral Take 1 tablet (25 mg total) by mouth 2 (two) times daily. 60 tablet 0  . ALBUTEROL SULFATE HFA 108 (90 BASE) MCG/ACT IN AERS Inhalation Inhale 2 puffs into the lungs every 2 (two) hours as needed for wheezing or shortness of breath (cough). 1 Inhaler 0  . ATORVASTATIN CALCIUM 10 MG PO TABS Oral Take 10 mg by mouth daily.      BP 114/68  Pulse 55  Temp 97.5 F (36.4 C) (Oral)  SpO2 98%  Physical Exam  Nursing note and vitals reviewed. Constitutional: She is oriented to person, place, and time. She appears well-developed and well-nourished. No distress.  HENT:  Head: Normocephalic and atraumatic.  Eyes: EOM are normal.  Neck: Neck supple. No tracheal deviation present.  Cardiovascular: Normal rate.   Pulmonary/Chest: Effort normal. No respiratory distress. She has wheezes (Few  scattered, faint, end-expiratory wheezing).  Musculoskeletal: Normal range of motion. She exhibits tenderness (Mild soft tenderness over rt lateral ligaments to rt lateral ankle with no bony pain or foot tenderness).  Neurological: She is alert and oriented to person, place, and time.  Skin: Skin is warm and dry.       CR less than 2 sec over rt ankle  Psychiatric: She has a normal mood and affect. Her behavior is normal.    ED Course  Procedures (including critical care time)  DIAGNOSTIC STUDIES: Oxygen Saturation is 98% on room air, normal by my interpretation.    COORDINATION OF CARE:  2:56PM - albuterol inhaler will be refilled for next few days; pt ready for d/c.   Labs Reviewed - No data to display No results found.   1. Asthma exacerbation   2. Right ankle sprain       MDM  I personally  performed the services described in this documentation, which was scribed in my presence. The recorded information has been reviewed and considered.  Patient / Family / Caregiver informed of clinical course, understand medical decision-making process, and agree with plan.         Hurman Horn, MD 07/25/12 [phone]

## 2012-07-20 ENCOUNTER — Emergency Department (HOSPITAL_COMMUNITY): Payer: Medicaid Other

## 2012-07-20 ENCOUNTER — Emergency Department (HOSPITAL_COMMUNITY)
Admission: EM | Admit: 2012-07-20 | Discharge: 2012-07-21 | Disposition: A | Discharge status: Home / Self Care | Payer: Medicaid Other | Attending: Emergency Medicine | Admitting: Emergency Medicine

## 2012-07-20 ENCOUNTER — Encounter (HOSPITAL_COMMUNITY): Payer: Self-pay | Admitting: *Deleted

## 2012-07-20 DIAGNOSIS — Z87891 Personal history of nicotine dependence: Secondary | ICD-10-CM | POA: Insufficient documentation

## 2012-07-20 DIAGNOSIS — Z880 Allergy status to penicillin: Secondary | ICD-10-CM | POA: Insufficient documentation

## 2012-07-20 DIAGNOSIS — I2510 Atherosclerotic heart disease of native coronary artery without angina pectoris: Secondary | ICD-10-CM | POA: Insufficient documentation

## 2012-07-20 DIAGNOSIS — I498 Other specified cardiac arrhythmias: Secondary | ICD-10-CM | POA: Insufficient documentation

## 2012-07-20 DIAGNOSIS — R0789 Other chest pain: Secondary | ICD-10-CM

## 2012-07-20 DIAGNOSIS — F411 Generalized anxiety disorder: Secondary | ICD-10-CM | POA: Insufficient documentation

## 2012-07-20 DIAGNOSIS — R071 Chest pain on breathing: Secondary | ICD-10-CM | POA: Insufficient documentation

## 2012-07-20 DIAGNOSIS — I10 Essential (primary) hypertension: Secondary | ICD-10-CM | POA: Insufficient documentation

## 2012-07-20 DIAGNOSIS — J45909 Unspecified asthma, uncomplicated: Secondary | ICD-10-CM | POA: Insufficient documentation

## 2012-07-20 NOTE — ED Notes (Signed)
Pt states she fell two wks ago and wasn't having any problems until today when her back started hurting; also requesting chest xray because she says she has had some shortness of breath today and left sided rib pain from fall

## 2012-07-20 NOTE — ED Notes (Signed)
Pt fell a couple of wks ago; lower back pain; rib pain with movement; left arm pain

## 2012-07-21 MED ORDER — ALBUTEROL SULFATE HFA 108 (90 BASE) MCG/ACT IN AERS
2.0000 | INHALATION_SPRAY | RESPIRATORY_TRACT | Status: DC | PRN
  Administered 2012-07-21: 2 via RESPIRATORY_TRACT
  Filled 2012-07-21: qty 6.7

## 2012-07-21 NOTE — ED Notes (Signed)
Pt reports she slipped while wearing flip flops on wet steps about two weeks. Pt reports back pain and rib pain.

## 2012-07-21 NOTE — ED Provider Notes (Signed)
History     CSN: [id number]  Arrival date & time 07/20/12  2304   First MD Initiated Contact with Patient 07/21/12 0002      Chief Complaint  Patient presents with  . Back Pain  . Chest Pain    (Consider location/radiation/quality/duration/timing/severity/associated sxs/prior treatment) HPI Comments: Patient states, about one week ago.  She fell down.  Several stab, landing on her bottom she injured her left foot.  She was seen here 2 days ago.  Again.  For pain in her foot, which was x-rayed.  There is no fracture.  She did not mention at that time that she was having intermittent posterior chest discomfort with shortness of breath, but she is also out of her albuterol inhaler, and she thinks that this may be related to her asthma. She states her foot is improving.  The pain is lessening swelling and bruising are dissipating.  She states she does not have a primary care physician.  He cannot afford to $45 for her inhaler  Patient is a 56 y.o. female presenting with back pain and chest pain. The history is provided by the patient.  Back Pain  This is a recurrent problem. The current episode started more than 1 week ago. The problem has not changed since onset.The pain is associated with falling. The pain is present in the gluteal region and thoracic spine. The pain radiates to the left foot. Associated symptoms include chest pain. Pertinent negatives include no fever and no headaches.  Chest Pain Pertinent negatives for primary symptoms include no fever, no shortness of breath, no cough and no dizziness.     Past Medical History  Diagnosis Date  . Myocardial infarction 1997  . Coronary artery disease   . Hypertension   . Arthritis   . Problem with literacy   . Shortness of breath   . Anxiety   . Bronchitis   . Chronic headaches     Past Surgical History  Procedure Date  . Ectopic pregnancy surgery     right    Family History  Problem Relation Age of Onset  . Cancer  Mother 45    lung ca  . Diabetes Mother   . Anxiety disorder Sister   . Cancer Brother 60    lung    History  Substance Use Topics  . Smoking status: Former Smoker -- 1.0 packs/day for 3 years    Quit date: 12/03/2010  . Smokeless tobacco: Not on file  . Alcohol Use: No    OB History    Grav Para Term Preterm Abortions TAB SAB Ect Mult Living                  Review of Systems  Constitutional: Negative for fever and chills.  HENT: Negative for congestion, rhinorrhea and neck stiffness.   Respiratory: Negative for cough and shortness of breath.   Cardiovascular: Positive for chest pain. Negative for leg swelling.  Musculoskeletal: Positive for back pain.  Neurological: Negative for dizziness and headaches.    Allergies  Penicillins  Home Medications   Current Outpatient Rx  Name Route Sig Dispense Refill  . ASPIRIN EC 81 MG PO TBEC Oral Take 81 mg by mouth every morning.   2  . ATORVASTATIN CALCIUM 10 MG PO TABS Oral Take 10 mg by mouth daily.    Marland Kitchen LEVOTHYROXINE SODIUM 50 MCG PO TABS Oral Take 1 tablet (50 mcg total) by mouth daily before breakfast. 30 tablet 1  . METOPROLOL  TARTRATE 25 MG PO TABS Oral Take 1 tablet (25 mg total) by mouth 2 (two) times daily. 60 tablet 0  . ALBUTEROL SULFATE HFA 108 (90 BASE) MCG/ACT IN AERS Inhalation Inhale 2 puffs into the lungs every 2 (two) hours as needed for wheezing or shortness of breath (cough). 1 Inhaler 0    BP 117/68  Pulse 67  Temp 98.3 F (36.8 C) (Oral)  Resp 16  SpO2 98%  Physical Exam  Constitutional: She is oriented to person, place, and time. She appears well-developed and well-nourished.  HENT:  Head: Normocephalic.  Eyes: Pupils are equal, round, and reactive to light.  Cardiovascular: Normal rate.   Pulmonary/Chest: Effort normal. She has no wheezes. She exhibits no tenderness.       She is not having any discomfort at this time  Abdominal: Soft.       Obese  Musculoskeletal: Normal range of motion.  She exhibits no edema.       Dorsal aspect of her right foot, minimally discolored  Neurological: She is alert and oriented to person, place, and time.  Skin: Skin is warm. No rash noted.    ED Course  Procedures (including critical care time)  Labs Reviewed - No data to display Dg Chest 2 View  07/20/2012  *RADIOLOGY REPORT*  Clinical Data: Shortness of breath, cough  CHEST - 2 VIEW  Comparison: 07/14/2012  Findings: Stable cardiomegaly without to CHF or pneumonia.  No collapse, consolidation, effusion or pneumothorax.  Trachea midline.  IMPRESSION: Stable cardiomegaly.  Original Report Authenticated By: Judie Petit. Ruel Favors, M.D.    Date: 07/21/2012  Rate: 131  Rhythm: sinus tachycardia  QRS Axis: paced  Intervals:paced  ST/T Wave abnormalities: paced  Conduction Disutrbances:paced  Narrative Interpretation:   Old EKG Reviewed:unchanged    1. Chest wall discomfort       MDM   Will supply this patient with an albuterol inhaler 2 bridge the gap until she can afford to fill her prescriptions.  She is not having any chest pain.  At this time.  She states, that it's most likely from her asthma.  She only has the discomfort, when she feels short of breath, or vice versa, and interval with more than a week ago, landing on her bottom, not striking her rib area.  I doubt highly that she has any rib fractures, although an x-ray was done, and reveals a normal, x-ray        Arman Filter, NP 07/21/12 0411  Arman Filter, NP 07/21/12 0411  Arman Filter, NP 07/21/12 [phone]

## 2012-07-21 NOTE — ED Provider Notes (Signed)
Medical screening examination/treatment/procedure(s) were performed by non-physician practitioner and as supervising physician I was immediately available for consultation/collaboration.   Ahniyah Giancola L Nikaela Coyne, MD 07/21/12 0605 

## 2012-07-29 ENCOUNTER — Other Ambulatory Visit: Payer: Self-pay | Admitting: Family Medicine

## 2012-07-29 MED ORDER — ALBUTEROL SULFATE HFA 108 (90 BASE) MCG/ACT IN AERS: 2.0000 | INHALATION_SPRAY | RESPIRATORY_TRACT | Status: DC | PRN

## 2012-08-01 MED ORDER — IPRATROPIUM BROMIDE 0.02 % IN SOLN
RESPIRATORY_TRACT | Status: AC
  Filled 2012-08-01: qty 2.5

## 2012-08-01 MED ORDER — ALBUTEROL SULFATE (5 MG/ML) 0.5% IN NEBU
INHALATION_SOLUTION | RESPIRATORY_TRACT | Status: AC
  Filled 2012-08-01: qty 1

## 2012-08-04 ENCOUNTER — Ambulatory Visit (INDEPENDENT_AMBULATORY_CARE_PROVIDER_SITE_OTHER): Payer: Self-pay | Admitting: Cardiovascular Disease

## 2012-08-04 ENCOUNTER — Encounter: Payer: Self-pay | Admitting: Cardiovascular Disease

## 2012-08-04 VITALS — BP 114/73 | HR 63 | Ht 68.0 in | Wt 228.0 lb

## 2012-08-04 DIAGNOSIS — E785 Hyperlipidemia, unspecified: Secondary | ICD-10-CM

## 2012-08-04 DIAGNOSIS — R002 Palpitations: Secondary | ICD-10-CM

## 2012-08-04 DIAGNOSIS — J449 Chronic obstructive pulmonary disease, unspecified: Secondary | ICD-10-CM

## 2012-08-04 DIAGNOSIS — R079 Chest pain, unspecified: Secondary | ICD-10-CM

## 2012-08-04 NOTE — Assessment & Plan Note (Signed)
No evidence of ischemia.  Normal RV/LV function.  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  I dont think this needs further w/u  Will have EPS review ECG and my note and see if they want to see her.  Otherwise would continue beta blocker and have her F/U with Dr Algie Coffer

## 2012-08-04 NOTE — Progress Notes (Signed)
Patient ID: [name], female   DOB: 1956/03/05, 56 y.o.   MRN: [id number]  56 years old female seen by Dr Ward Givens in hospital 7/13 for palpitations  with excess caffeine intake from coffee and sodas has palpitations from recurrent VPCs, Bigeminy and non sustained VT. No dizziness or syncope. She responded to holding caffeine intake and resuming B-blocker. Amiodarone was discontinued due to elevated TSH. She was discharged home in satisfactory condition. [facility] admission 06/06/12 with normal stress myovue and no exercise induced arrythmia. She gets very infrequent palpitations and no presyncope.  She tries to do some exercise at home Mild chronic exertional dyspnea from obesity.  No chest pain.    Echo 12/26/11 normal Study Conclusions  - Left ventricle: The cavity size was normal. There was mild concentric hypertrophy. Systolic function was normal. The estimated ejection fraction was in the range of 55% to 60%. Wall motion was normal; there were no regional wall motion abnormalities. Doppler parameters are consistent with abnormal left ventricular relaxation (grade 1 diastolic dysfunction). - Pericardium, extracardiac: A trivial pericardial effusion was identified posterior to the heart.  ROS: Denies fever, malais, weight loss, blurry vision, decreased visual acuity, cough, sputum, SOB, hemoptysis, pleuritic pain, palpitaitons, heartburn, abdominal pain, melena, lower extremity edema, claudication, or rash.  All other systems reviewed and negative   General: Affect appropriate Healthy:  appears stated age HEENT: normal Neck supple with no adenopathy JVP normal no bruits no thyromegaly Lungs clear with no wheezing and good diaphragmatic motion Heart:  S1/S2 no murmur,rub, gallop or click PMI normal Abdomen: benighn, BS positve, no tenderness, no AAA no bruit.  No HSM or HJR Distal pulses intact with no bruits No edema Neuro non-focal Skin warm and dry No muscular  weakness  Medications Current Outpatient Prescriptions  Medication Sig Dispense Refill  . albuterol (PROVENTIL HFA;VENTOLIN HFA) 108 (90 BASE) MCG/ACT inhaler Inhale 2 puffs into the lungs every 2 (two) hours as needed for wheezing or shortness of breath (cough).  1 Inhaler  5  . aspirin EC 81 MG tablet Take 81 mg by mouth every morning.     2  . atorvastatin (LIPITOR) 10 MG tablet Take 10 mg by mouth daily.      Marland Kitchen levothyroxine (SYNTHROID, LEVOTHROID) 50 MCG tablet Take 1 tablet (50 mcg total) by mouth daily before breakfast.  30 tablet  1  . metoprolol succinate (TOPROL-XL) 50 MG 24 hr tablet Take 50 mg by mouth daily. Take with or immediately following a meal.      . DISCONTD: furosemide (LASIX) 20 MG tablet Take 20 mg by mouth daily.        Allergies Penicillins  Family History: Family History  Problem Relation Age of Onset  . Cancer Mother 57    lung ca  . Diabetes Mother   . Anxiety disorder Sister   . Cancer Brother 23    lung    Social History: History   Social History  . Marital Status: Legally Separated    Spouse Name: N/A    Number of Children: N/A  . Years of Education: N/A   Occupational History  . Not on file.   Social History Main Topics  . Smoking status: Former Smoker -- 1.0 packs/day for 3 years    Quit date: 12/03/2010  . Smokeless tobacco: Not on file  . Alcohol Use: No  . Drug Use: No  . Sexually Active: No   Other Topics Concern  . Not on file   Social  History Narrative   Lives with a female friend, just roommates, separated.  Has 4 kids, all grown.  Finished 10th grade.  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      Electrocardiogram:  NSR normal QT  Couplets with likely RVOT ventricular beats  Assessment and Plan

## 2012-08-04 NOTE — Assessment & Plan Note (Signed)
Resolved Normal myovue 06/06/12

## 2012-08-04 NOTE — Patient Instructions (Signed)
Your physician recommends that you schedule a follow-up appointment in: AS NEEDED  Your physician recommends that you continue on your current medications as directed. Please refer to the Current Medication list given to you today.  

## 2012-08-04 NOTE — Assessment & Plan Note (Signed)
Cholesterol is at goal.  Continue current dose of statin and diet Rx.  No myalgias or side effects.  F/U  LFT's in 6 months. Lab Results  Component Value Date   LDLCALC 111* 06/28/2012

## 2012-08-04 NOTE — Assessment & Plan Note (Signed)
Continue inhaler and F/U with [facility] Dyspnea appears due to this

## 2012-08-05 ENCOUNTER — Telehealth: Payer: Self-pay | Admitting: Cardiovascular Disease

## 2012-08-05 NOTE — Telephone Encounter (Signed)
Fu call °Patient returning your call °

## 2012-08-05 NOTE — Telephone Encounter (Signed)
PT AWARE PER DR NISHAN NEEDS TO F/U WITH  DR ALLRED  APPT MADE FOR  09-02-12 AT 1:45 PM

## 2012-08-11 ENCOUNTER — Ambulatory Visit: Payer: Self-pay | Admitting: Family Medicine

## 2012-08-19 ENCOUNTER — Encounter (HOSPITAL_COMMUNITY): Payer: Self-pay | Admitting: Emergency Medicine

## 2012-08-19 DIAGNOSIS — I2510 Atherosclerotic heart disease of native coronary artery without angina pectoris: Secondary | ICD-10-CM | POA: Insufficient documentation

## 2012-08-19 DIAGNOSIS — N390 Urinary tract infection, site not specified: Secondary | ICD-10-CM | POA: Insufficient documentation

## 2012-08-19 DIAGNOSIS — R109 Unspecified abdominal pain: Secondary | ICD-10-CM | POA: Insufficient documentation

## 2012-08-19 DIAGNOSIS — I252 Old myocardial infarction: Secondary | ICD-10-CM | POA: Insufficient documentation

## 2012-08-19 DIAGNOSIS — R300 Dysuria: Secondary | ICD-10-CM | POA: Insufficient documentation

## 2012-08-19 DIAGNOSIS — N3289 Other specified disorders of bladder: Secondary | ICD-10-CM | POA: Insufficient documentation

## 2012-08-19 DIAGNOSIS — I10 Essential (primary) hypertension: Secondary | ICD-10-CM | POA: Insufficient documentation

## 2012-08-19 DIAGNOSIS — Z79899 Other long term (current) drug therapy: Secondary | ICD-10-CM | POA: Insufficient documentation

## 2012-08-19 DIAGNOSIS — Z87891 Personal history of nicotine dependence: Secondary | ICD-10-CM | POA: Insufficient documentation

## 2012-08-19 DIAGNOSIS — R071 Chest pain on breathing: Secondary | ICD-10-CM | POA: Insufficient documentation

## 2012-08-19 DIAGNOSIS — M545 Low back pain, unspecified: Secondary | ICD-10-CM | POA: Insufficient documentation

## 2012-08-19 LAB — CBC WITH DIFFERENTIAL/PLATELET
Eosinophils Absolute: 0.4 10*3/uL (ref 0.0–0.7)
Eosinophils Relative: 5 % (ref 0–5)
Hemoglobin: 13.5 g/dL (ref 12.0–15.0)
Lymphs Abs: 2.6 10*3/uL (ref 0.7–4.0)
MCH: 28.2 pg (ref 26.0–34.0)
MCHC: 33.1 g/dL (ref 30.0–36.0)
MCV: 85.4 fL (ref 78.0–100.0)
Monocytes Relative: 7 % (ref 3–12)
Platelets: 270 10*3/uL (ref 150–400)
RBC: 4.78 MIL/uL (ref 3.87–5.11)

## 2012-08-19 LAB — COMPREHENSIVE METABOLIC PANEL
ALT: 13 U/L (ref 0–35)
AST: 18 U/L (ref 0–37)
Albumin: 4 g/dL (ref 3.5–5.2)
Chloride: 101 mEq/L (ref 96–112)
Creatinine, Ser: 0.8 mg/dL (ref 0.50–1.10)
Sodium: 138 mEq/L (ref 135–145)
Total Bilirubin: 0.3 mg/dL (ref 0.3–1.2)

## 2012-08-19 LAB — URINALYSIS, ROUTINE W REFLEX MICROSCOPIC
Bilirubin Urine: NEGATIVE
Glucose, UA: NEGATIVE mg/dL
Hgb urine dipstick: NEGATIVE
Ketones, ur: NEGATIVE mg/dL
Protein, ur: NEGATIVE mg/dL

## 2012-08-19 NOTE — ED Notes (Addendum)
Lower back pain/ flank pain on right side; left shoulder pain reported comes and goes; hx of chronic back; sharp pains in right flank area. Nausea but no vomiting starting today. Denies chest pain. Denies hx kidney stone.

## 2012-08-20 ENCOUNTER — Emergency Department (HOSPITAL_COMMUNITY)
Admission: EM | Admit: 2012-08-20 | Discharge: 2012-08-20 | Disposition: A | Discharge status: Home / Self Care | Payer: Medicaid Other | Attending: Emergency Medicine | Admitting: Emergency Medicine

## 2012-08-20 ENCOUNTER — Encounter (HOSPITAL_COMMUNITY): Payer: Self-pay | Admitting: Radiology

## 2012-08-20 ENCOUNTER — Emergency Department (HOSPITAL_COMMUNITY): Payer: Medicaid Other

## 2012-08-20 DIAGNOSIS — N390 Urinary tract infection, site not specified: Secondary | ICD-10-CM

## 2012-08-20 DIAGNOSIS — R0789 Other chest pain: Secondary | ICD-10-CM

## 2012-08-20 DIAGNOSIS — N3289 Other specified disorders of bladder: Secondary | ICD-10-CM

## 2012-08-20 MED ORDER — HYDROMORPHONE HCL PF 1 MG/ML IJ SOLN
1.0000 mg | Freq: Once | INTRAMUSCULAR | Status: AC
  Administered 2012-08-20: 1 mg via INTRAMUSCULAR
  Filled 2012-08-20: qty 1

## 2012-08-20 MED ORDER — SULFAMETHOXAZOLE-TRIMETHOPRIM 800-160 MG PO TABS: 1.0000 | ORAL_TABLET | Freq: Two times a day (BID) | ORAL | Status: AC

## 2012-08-20 MED ORDER — HYDROCODONE-ACETAMINOPHEN 7.5-500 MG/15ML PO SOLN: 15.0000 mL | Freq: Four times a day (QID) | ORAL | Status: AC | PRN

## 2012-08-20 MED ORDER — DEXTROSE 5 % IV SOLN
1.0000 g | Freq: Once | INTRAVENOUS | Status: AC
  Administered 2012-08-20: 1 g via INTRAVENOUS
  Filled 2012-08-20: qty 10

## 2012-08-20 MED ORDER — ONDANSETRON 4 MG PO TBDP
4.0000 mg | ORAL_TABLET | Freq: Once | ORAL | Status: AC
  Administered 2012-08-20: 4 mg via ORAL
  Filled 2012-08-20: qty 1

## 2012-08-20 NOTE — ED Notes (Signed)
Patient transported to CT 

## 2012-08-20 NOTE — ED Notes (Signed)
Pt discharged home in good condition. 

## 2012-08-20 NOTE — ED Provider Notes (Signed)
History     CSN: [id number]  Arrival date & time 08/19/12  2213   First MD Initiated Contact with Patient 08/20/12 [phone]      Chief Complaint  Patient presents with  . Back Pain    (Consider location/radiation/quality/duration/timing/severity/associated sxs/prior treatment) HPI HX per PT. R flank and back pain with h/o chronic back pain. No hematuria or h/o kidney stones. No F/C. No N/V/D. Pain worse with movement. No SOB or CP. Has some dysuria, no urgency/ frequency.  Past Medical History  Diagnosis Date  . Myocardial infarction 1997  . Coronary artery disease   . Hypertension   . Arthritis   . Problem with literacy   . Shortness of breath   . Anxiety   . Bronchitis   . Chronic headaches     Past Surgical History  Procedure Date  . Ectopic pregnancy surgery     right    Family History  Problem Relation Age of Onset  . Cancer Mother 38    lung ca  . Diabetes Mother   . Anxiety disorder Sister   . Cancer Brother 60    lung    History  Substance Use Topics  . Smoking status: Former Smoker -- 1.0 packs/day for 3 years    Quit date: 12/03/2010  . Smokeless tobacco: Not on file  . Alcohol Use: No    OB History    Grav Para Term Preterm Abortions TAB SAB Ect Mult Living                  Review of Systems  Constitutional: Negative for fever and chills.  HENT: Negative for neck pain and neck stiffness.   Eyes: Negative for pain.  Respiratory: Negative for shortness of breath.   Cardiovascular: Negative for palpitations and leg swelling.  Gastrointestinal: Negative for abdominal pain.  Musculoskeletal: Negative for joint swelling.  Skin: Negative for rash.  Neurological: Negative for headaches.  All other systems reviewed and are negative.    Allergies  Penicillins  Home Medications   Current Outpatient Rx  Name Route Sig Dispense Refill  . ALBUTEROL SULFATE HFA 108 (90 BASE) MCG/ACT IN AERS Inhalation Inhale 2 puffs into the lungs every 2 (two)  hours as needed for wheezing or shortness of breath (cough). 1 Inhaler 5  . ASPIRIN EC 81 MG PO TBEC Oral Take 81 mg by mouth every morning.   2  . ATORVASTATIN CALCIUM 10 MG PO TABS Oral Take 10 mg by mouth daily.    Marland Kitchen LEVOTHYROXINE SODIUM 50 MCG PO TABS Oral Take 1 tablet (50 mcg total) by mouth daily before breakfast. 30 tablet 1  . METOPROLOL SUCCINATE ER 50 MG PO TB24 Oral Take 50 mg by mouth daily. Take with or immediately following a meal.    . HYDROCODONE-ACETAMINOPHEN 7.5-500 MG/15ML PO SOLN Oral Take 15 mLs by mouth every 6 (six) hours as needed for pain. 60 mL 0  . SULFAMETHOXAZOLE-TRIMETHOPRIM 800-160 MG PO TABS Oral Take 1 tablet by mouth every 12 (twelve) hours. 14 tablet 0    BP 101/70  Pulse 64  Temp 98.1 F (36.7 C) (Oral)  Resp 12  SpO2 100%  Physical Exam  Constitutional: She is oriented to person, place, and time. She appears well-developed and well-nourished.  HENT:  Head: Normocephalic and atraumatic.  Eyes: Conjunctivae and EOM are normal. Pupils are equal, round, and reactive to light.  Neck: Trachea normal. Neck supple. No thyromegaly present.  Cardiovascular: Normal rate, regular rhythm, S1  normal, S2 normal and normal pulses.     No systolic murmur is present   No diastolic murmur is present  Pulses:      Radial pulses are 2+ on the right side, and 2+ on the left side.  Pulmonary/Chest: Effort normal and breath sounds normal. She has no wheezes. She has no rhonchi. She has no rales.       Reproducible R lateral chest wall tenderness, no erythema, rash or crepitus.   Abdominal: Soft. Normal appearance and bowel sounds are normal. She exhibits no distension. There is no tenderness. There is no rebound, no guarding, no CVA tenderness and negative Murphy's sign.  Musculoskeletal:       BLE:s Calves nontender, no cords or erythema, negative Homans sign  Neurological: She is alert and oriented to person, place, and time. She has normal strength. No cranial nerve  deficit or sensory deficit. GCS eye subscore is 4. GCS verbal subscore is 5. GCS motor subscore is 6.  Skin: Skin is warm and dry. No rash noted. She is not diaphoretic.  Psychiatric: Her speech is normal.       Cooperative and appropriate    ED Course  Procedures (including critical care time)  Results for orders placed during the hospital encounter of 08/20/12  COMPREHENSIVE METABOLIC PANEL      Component Value Range   Sodium 138  135 - 145 mEq/L   Potassium 3.7  3.5 - 5.1 mEq/L   Chloride 101  96 - 112 mEq/L   CO2 26  19 - 32 mEq/L   Glucose, Bld 104 (*) 70 - 99 mg/dL   BUN 14  6 - 23 mg/dL   Creatinine, Ser 1.61  0.50 - 1.10 mg/dL   Calcium 9.5  8.4 - 09.6 mg/dL   Total Protein 7.4  6.0 - 8.3 g/dL   Albumin 4.0  3.5 - 5.2 g/dL   AST 18  0 - 37 U/L   ALT 13  0 - 35 U/L   Alkaline Phosphatase 85  39 - 117 U/L   Total Bilirubin 0.3  0.3 - 1.2 mg/dL   GFR calc non Af Amer 81 (*) >90 mL/min   GFR calc Af Amer >90  >90 mL/min  CBC WITH DIFFERENTIAL      Component Value Range   WBC 8.3  4.0 - 10.5 K/uL   RBC 4.78  3.87 - 5.11 MIL/uL   Hemoglobin 13.5  12.0 - 15.0 g/dL   HCT 04.5  40.9 - 81.1 %   MCV 85.4  78.0 - 100.0 fL   MCH 28.2  26.0 - 34.0 pg   MCHC 33.1  30.0 - 36.0 g/dL   RDW 91.4  78.2 - 95.6 %   Platelets 270  150 - 400 K/uL   Neutrophils Relative 57  43 - 77 %   Neutro Abs 4.7  1.7 - 7.7 K/uL   Lymphocytes Relative 31  12 - 46 %   Lymphs Abs 2.6  0.7 - 4.0 K/uL   Monocytes Relative 7  3 - 12 %   Monocytes Absolute 0.6  0.1 - 1.0 K/uL   Eosinophils Relative 5  0 - 5 %   Eosinophils Absolute 0.4  0.0 - 0.7 K/uL   Basophils Relative 1  0 - 1 %   Basophils Absolute 0.1  0.0 - 0.1 K/uL  URINALYSIS, ROUTINE W REFLEX MICROSCOPIC      Component Value Range   Color, Urine YELLOW  YELLOW  APPearance CLOUDY (*) CLEAR   Specific Gravity, Urine 1.010  1.005 - 1.030   pH 6.0  5.0 - 8.0   Glucose, UA NEGATIVE  NEGATIVE mg/dL   Hgb urine dipstick NEGATIVE  NEGATIVE    Bilirubin Urine NEGATIVE  NEGATIVE   Ketones, ur NEGATIVE  NEGATIVE mg/dL   Protein, ur NEGATIVE  NEGATIVE mg/dL   Urobilinogen, UA 0.2  0.0 - 1.0 mg/dL   Nitrite POSITIVE (*) NEGATIVE   Leukocytes, UA LARGE (*) NEGATIVE  URINE MICROSCOPIC-ADD ON      Component Value Range   Squamous Epithelial / LPF FEW (*) RARE   WBC, UA 7-10  <3 WBC/hpf   Bacteria, UA MANY (*) RARE   Ct Abdomen Pelvis Wo Contrast  08/20/2012  *RADIOLOGY REPORT*  Clinical Data: Low back and right flank pain, left shoulder pain, nausea  CT ABDOMEN AND PELVIS WITHOUT CONTRAST  Technique:  Multidetector CT imaging of the abdomen and pelvis was performed following the standard protocol without intravenous contrast. Sagittal and coronal MPR images reconstructed from axial data set.  Comparison: None  Findings: Minimal atelectasis at lung bases. No urinary tract calcification or dilatation. Within limits of a nonenhanced exam, no focal abnormalities of the liver, spleen, pancreas, kidneys, or adrenal glands identified. Normal appendix. Unremarkable uterus and adnexae. Bladder incompletely distended with questionable asymmetric wall thickening along the right antral lateral aspect. Stomach and bowel loops unremarkable. No mass, adenopathy, or free fluid. No hernia or acute bony lesions.  IMPRESSION: Question mild right anterolateral bladder wall thickening, recommend follow-up cystoscopy assessment to exclude tumor. No acute intra-abdominal or intrapelvic abnormalities identified.   Original Report Authenticated By: Lollie Marrow, M.D.     Date: 08/22/2012  Rate: 76  Rhythm: normal sinus rhythm  QRS Axis: normal  Intervals: normal  ST/T Wave abnormalities: nonspecific ST changes  Conduction Disutrbances:none  Narrative Interpretation:   Old EKG Reviewed: none available  ECG reviewed - presentation does not suggest ACS   1. Chest wall pain   2. UTI (lower urinary tract infection)   3. Bladder mass    R chest wall pain no RUQ  tenderness and neg Murphys sign. No rash or clinical zosters  IV dilaudid improved pain - on recheck pain resolved.   Found to have UTI, treated IV Abx, ? Mass on Ct scan - PT aware and agrees to follow up.   Stable for d/c home strict return precautions and close outpatient follow up. Urology referral provided.   MDM   VS and nursing notes reviewed. IV narcotics. CT. UA, ECG, labs as above.         Sunnie Nielsen, MD 08/22/12 [phone]

## 2012-08-22 LAB — URINE CULTURE: Colony Count: >=100000

## 2012-08-23 NOTE — ED Notes (Signed)
+  Urine. Patient treated with Septra DS. Sensitive to same. Per protocol MD. °

## 2012-08-26 ENCOUNTER — Encounter: Payer: Self-pay | Admitting: Family Medicine

## 2012-08-26 ENCOUNTER — Ambulatory Visit (INDEPENDENT_AMBULATORY_CARE_PROVIDER_SITE_OTHER): Payer: Self-pay | Admitting: Family Medicine

## 2012-08-26 VITALS — BP 116/83 | HR 64 | Ht 68.0 in | Wt 227.0 lb

## 2012-08-26 DIAGNOSIS — F411 Generalized anxiety disorder: Secondary | ICD-10-CM

## 2012-08-26 DIAGNOSIS — R9341 Abnormal radiologic findings on diagnostic imaging of renal pelvis, ureter, or bladder: Secondary | ICD-10-CM

## 2012-08-26 DIAGNOSIS — E039 Hypothyroidism, unspecified: Secondary | ICD-10-CM

## 2012-08-26 DIAGNOSIS — F419 Anxiety disorder, unspecified: Secondary | ICD-10-CM

## 2012-08-26 DIAGNOSIS — R9389 Abnormal findings on diagnostic imaging of other specified body structures: Secondary | ICD-10-CM

## 2012-08-26 MED ORDER — PAROXETINE HCL 20 MG PO TABS: 20.0000 mg | ORAL_TABLET | ORAL | Status: DC

## 2012-08-26 NOTE — Patient Instructions (Addendum)
Bring your medicaid card when you get it and I will refer you to a bladder doctor  Go to health department and get your thyroid medicine  Take prescription to get your nerve medicine e(paroxetine)  Call for appointment here instead of going to ER

## 2012-08-27 DIAGNOSIS — R9341 Abnormal radiologic findings on diagnostic imaging of renal pelvis, ureter, or bladder: Secondary | ICD-10-CM | POA: Insufficient documentation

## 2012-08-27 NOTE — Assessment & Plan Note (Signed)
Patient [facility] supply [facility] at [facility]- asked her to pick it up.  Will recheck at next visit.

## 2012-08-27 NOTE — Assessment & Plan Note (Signed)
Patient asymptomatic.  States medicaid is pending.  Instructed patient will be able to refer once she brings her medicaid card to [place].

## 2012-08-27 NOTE — Progress Notes (Signed)
  Subjective:    Patient ID: [name], female    DOB: 1956/01/18, 56 y.o.   MRN: [id number]  HPI patient here for ER follow-up  States she is here for a referal to have her gallbladder removed.  Poor historian.  It is not clear from her history why she went to the ER.  Upon reviewing notes- patient was seen for chest wall pain, abdominal pain and was diagnosed with UTI.  She had a CT abdomen which reported "Question mild right anterolateral bladder wall thickening,  recommend follow-up cystoscopy assessment to exclude tumor."  Patient has a bottle of bactrim she picked up after going to ER.  She cannot endorse any urinary frequency, hesitancy or dysuria.  Hypothyroidism:  Does not have medication with her other bottles but there is a letter informing her that a supply will be sent to who I presume is the [facility].  This was dated over a month ago.  She has not checked with the pharmacy.  Depression, anxiety;  Doing ok- not taking paroxetin due to confusion/unable to afford.  Still sad when thinking about her deceased fiancee but is reassured by the fact that he loved her and is now her guardian angel watching over her.     Review of Systemssee HPI     Objective:   Physical Exam GEN: Alert & Oriented, No acute distress        Assessment & Plan:

## 2012-08-27 NOTE — Assessment & Plan Note (Signed)
Encouraged her to pick up medicine, reminded her is on [facility] $4 list.  Gave her paper prescription today.

## 2012-09-02 ENCOUNTER — Ambulatory Visit (INDEPENDENT_AMBULATORY_CARE_PROVIDER_SITE_OTHER): Payer: Self-pay | Admitting: Internal Medicine

## 2012-09-02 ENCOUNTER — Encounter: Payer: Self-pay | Admitting: Internal Medicine

## 2012-09-02 VITALS — BP 98/62 | HR 58 | Ht 68.0 in | Wt 229.0 lb

## 2012-09-02 DIAGNOSIS — I493 Ventricular premature depolarization: Secondary | ICD-10-CM

## 2012-09-02 DIAGNOSIS — I4949 Other premature depolarization: Secondary | ICD-10-CM

## 2012-09-02 NOTE — Assessment & Plan Note (Signed)
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  These are of a LBB inferior axis morphology with precordial transition between V2 and V3.  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  She has no evidence of structural heart disease and no FH of arrhythmias or sudden death.  She denies syncope.  Therapeutic strategies for PVCs including medicine and ablation were discussed in detail with the patient today. At this time, she is clear that she would like to avoid ablation.  We will therefore continue toprol. She will contact my office if she has syncope or worsening symptoms. She will return in 6 months for further evaluation.

## 2012-09-02 NOTE — Progress Notes (Signed)
Primary Care Physician: Delbert Harness, MD Referring Physician:   Dr Malachi Paradise [name] is a 56 y.o. female with a h/o PVCs who presents today for EP consultation.  She reports long standing tachypalpitations for which she previously was evaluated by Dr Algie Coffer.  She presented to [facility] 07/2012 with tachypalpitations.  She was observed to have very frequent PVCs.  She describes her symptoms as "skipped beats" which are irregular.  She has been taking toprol with reasonable control of her palpitations.  She has had no further episodes since that time. She has chronic stable SOB for which she has been evaluated.  She is felt to have COPD.  No further ischemic workup was advised by Dr Eden Emms.  An echo revealed no structural heart disease.  She denies FH of arrhythmias.  Today, she denies symptoms of chest pain,  dizziness, presyncope, syncope, or neurologic sequela. The patient is tolerating medications without difficulties and is otherwise without complaint today.   Past Medical History  Diagnosis Date  . Myocardial infarction 1997  . Coronary artery disease   . Hypertension   . Arthritis   . Problem with literacy   . Shortness of breath   . Anxiety   . Bronchitis   . Chronic headaches   . Obesity    Past Surgical History  Procedure Date  . Ectopic pregnancy surgery     right    Current Outpatient Prescriptions  Medication Sig Dispense Refill  . albuterol (PROVENTIL HFA;VENTOLIN HFA) 108 (90 BASE) MCG/ACT inhaler Inhale 2 puffs into the lungs every 2 (two) hours as needed for wheezing or shortness of breath (cough).  1 Inhaler  5  . aspirin EC 81 MG tablet Take 81 mg by mouth every morning.     2  . atorvastatin (LIPITOR) 10 MG tablet Take 10 mg by mouth daily.      Marland Kitchen levothyroxine (SYNTHROID, LEVOTHROID) 50 MCG tablet Take 1 tablet (50 mcg total) by mouth daily before breakfast.  30 tablet  1  . metoprolol succinate (TOPROL-XL) 50 MG 24 hr tablet Take 50 mg by mouth  daily. Take with or immediately following a meal.      . PARoxetine (PAXIL) 20 MG tablet Take 1 tablet (20 mg total) by mouth every morning.  30 tablet  5  . DISCONTD: furosemide (LASIX) 20 MG tablet Take 20 mg by mouth daily.        Allergies  Allergen Reactions  . Penicillins Rash    History   Social History  . Marital Status: Legally Separated    Spouse Name: N/A    Number of Children: N/A  . Years of Education: N/A   Occupational History  . Not on file.   Social History Main Topics  . Smoking status: Former Smoker -- 1.0 packs/day for 3 years    Quit date: 12/03/2010  . Smokeless tobacco: Not on file  . Alcohol Use: No  . Drug Use: No  . Sexually Active: No   Other Topics Concern  . Not on file   Social History Narrative   Lives with a female friend, just roommates, separated.  Has 4 kids, all grown.  Finished 10th grade.  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  Lives in [place]    Family History  Problem Relation Age of Onset  . Cancer Mother 64    lung ca  . Diabetes Mother   . Anxiety disorder Sister   . Cancer Brother  60    lung    ROS- All systems are reviewed and negative except as per the HPI above  Physical Exam: Filed Vitals:   09/02/12 1353  BP: 98/62  Pulse: 58  Height: 5\' 8"  (1.727 m)  Weight: 229 lb (103.874 kg)    GEN- The patient is morbidly obese appearing, alert and oriented x 3 today.   Head- normocephalic, atraumatic Eyes-  Sclera clear, conjunctiva pink Ears- hearing intact Oropharynx- clear Neck- supple, no JVP Lymph- no cervical lymphadenopathy Lungs- Clear to ausculation bilaterally, normal work of breathing Heart- Regular rate and rhythm, no murmurs, rubs or gallops, PMI not laterally displaced GI- soft, NT, ND, + BS Extremities- no clubbing, cyanosis, or edema MS- no significant deformity or atrophy Skin- no rash or lesion Psych- euthymic mood, full affect Neuro- strength and sensation are intact  EKG  today reveals sinus bradycardia with PVCs in couplets.  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

## 2012-09-02 NOTE — Patient Instructions (Addendum)
Your physician wants you to follow-up in: 6 months with Dr. Allred. You will receive a reminder letter in the mail two months in advance. If you don't receive a letter, please call our office to schedule the follow-up appointment.  

## 2012-09-15 ENCOUNTER — Encounter (HOSPITAL_COMMUNITY): Payer: Self-pay | Admitting: Emergency Medicine

## 2012-09-15 ENCOUNTER — Emergency Department (HOSPITAL_COMMUNITY)
Admission: EM | Admit: 2012-09-15 | Discharge: 2012-09-15 | Disposition: A | Discharge status: Home / Self Care | Payer: Medicaid Other | Attending: Emergency Medicine | Admitting: Emergency Medicine

## 2012-09-15 ENCOUNTER — Emergency Department (HOSPITAL_COMMUNITY): Payer: Medicaid Other

## 2012-09-15 DIAGNOSIS — Z7982 Long term (current) use of aspirin: Secondary | ICD-10-CM | POA: Insufficient documentation

## 2012-09-15 DIAGNOSIS — I493 Ventricular premature depolarization: Secondary | ICD-10-CM

## 2012-09-15 DIAGNOSIS — R079 Chest pain, unspecified: Secondary | ICD-10-CM

## 2012-09-15 DIAGNOSIS — I10 Essential (primary) hypertension: Secondary | ICD-10-CM | POA: Insufficient documentation

## 2012-09-15 DIAGNOSIS — I252 Old myocardial infarction: Secondary | ICD-10-CM | POA: Insufficient documentation

## 2012-09-15 DIAGNOSIS — Z79899 Other long term (current) drug therapy: Secondary | ICD-10-CM | POA: Insufficient documentation

## 2012-09-15 DIAGNOSIS — I4949 Other premature depolarization: Secondary | ICD-10-CM | POA: Insufficient documentation

## 2012-09-15 DIAGNOSIS — R0602 Shortness of breath: Secondary | ICD-10-CM | POA: Insufficient documentation

## 2012-09-15 DIAGNOSIS — I2510 Atherosclerotic heart disease of native coronary artery without angina pectoris: Secondary | ICD-10-CM | POA: Insufficient documentation

## 2012-09-15 DIAGNOSIS — M25519 Pain in unspecified shoulder: Secondary | ICD-10-CM | POA: Insufficient documentation

## 2012-09-15 LAB — CBC
HCT: 39.5 % (ref 36.0–46.0)
MCHC: 32.9 g/dL (ref 30.0–36.0)
MCV: 86.6 fL (ref 78.0–100.0)
Platelets: 226 10*3/uL (ref 150–400)
RDW: 14.4 % (ref 11.5–15.5)
WBC: 6 10*3/uL (ref 4.0–10.5)

## 2012-09-15 LAB — POCT I-STAT TROPONIN I
Troponin i, poc: 0 ng/mL (ref 0.00–0.08)
Troponin i, poc: 0 ng/mL (ref 0.00–0.08)

## 2012-09-15 LAB — COMPREHENSIVE METABOLIC PANEL
AST: 17 U/L (ref 0–37)
Albumin: 3.6 g/dL (ref 3.5–5.2)
BUN: 11 mg/dL (ref 6–23)
Calcium: 9.2 mg/dL (ref 8.4–10.5)
Chloride: 106 mEq/L (ref 96–112)
Creatinine, Ser: 0.84 mg/dL (ref 0.50–1.10)
Total Bilirubin: 0.4 mg/dL (ref 0.3–1.2)
Total Protein: 6.5 g/dL (ref 6.0–8.3)

## 2012-09-15 MED ORDER — OXYCODONE-ACETAMINOPHEN 5-325 MG PO TABS: ORAL_TABLET | ORAL | Status: DC

## 2012-09-15 MED ORDER — OXYCODONE-ACETAMINOPHEN 5-325 MG PO TABS
1.0000 | ORAL_TABLET | Freq: Once | ORAL | Status: AC
  Administered 2012-09-15: 1 via ORAL
  Filled 2012-09-15: qty 1

## 2012-09-15 MED ORDER — NITROGLYCERIN 0.4 MG SL SUBL: 0.4000 mg | SUBLINGUAL_TABLET | SUBLINGUAL | Status: DC | PRN

## 2012-09-15 MED ORDER — ASPIRIN 81 MG PO CHEW
240.0000 mg | CHEWABLE_TABLET | Freq: Once | ORAL | Status: AC
  Administered 2012-09-15: 243 mg via ORAL
  Filled 2012-09-15: qty 3

## 2012-09-15 NOTE — Progress Notes (Signed)
7:11 PM Pt with palpitations, normal exam, EKG with PVC's, negative enzymes and chest x-ray.  Discussed with Dr. Myrtis Ser of [facility], who will arrange for pt to be called in to the office for recheck with Dr. Johney Frame, her electrophysiologist.

## 2012-09-15 NOTE — ED Provider Notes (Signed)
History     CSN: [id number]  Arrival date & time 09/15/12  1429   First MD Initiated Contact with Patient 09/15/12 1529      Chief Complaint  Patient presents with  . Chest Pain    (Consider location/radiation/quality/duration/timing/severity/associated sxs/prior treatment) HPI  56 y.o. female with past medical history significant for prior MI, hyperlipidemia, former smoker, PVCs and anxiety INAD c/o 10 minutes of  non-exertional SOB x2days, intermittently associated with 5-10 minutes of right sided chest pain. Denies nausea, vomiting, diaphoresis, cough, fever, abdominal pain, change in bowel or bladder habits.  Last Stress test several months ago, normal Last Cath 1 year ago normal.  PCP Dr. Earnest Bailey Cards: [facility] [place] ECHO in 12/2011 EF 5560%   and aPast Medical History  Diagnosis Date  . Myocardial infarction 1997  . Coronary artery disease   . Hypertension   . Arthritis   . Problem with literacy   . Shortness of breath   . Anxiety   . Bronchitis   . Chronic headaches   . Obesity     Past Surgical History  Procedure Date  . Ectopic pregnancy surgery     right    Family History  Problem Relation Age of Onset  . Cancer Mother 4    lung ca  . Diabetes Mother   . Anxiety disorder Sister   . Cancer Brother 60    lung    History  Substance Use Topics  . Smoking status: Former Smoker -- 1.0 packs/day for 3 years    Quit date: 12/03/2010  . Smokeless tobacco: Not on file  . Alcohol Use: No    OB History    Grav Para Term Preterm Abortions TAB SAB Ect Mult Living                  Review of Systems  Constitutional: Negative for fever.  Respiratory: Positive for shortness of breath.   Cardiovascular: Positive for chest pain.  Gastrointestinal: Negative for nausea, vomiting, abdominal pain and diarrhea.  All other systems reviewed and are negative.    Allergies  Penicillins  Home Medications   Current Outpatient Rx  Name Route Sig  Dispense Refill  . ALBUTEROL SULFATE HFA 108 (90 BASE) MCG/ACT IN AERS Inhalation Inhale 2 puffs into the lungs every 2 (two) hours as needed for wheezing or shortness of breath (cough). 1 Inhaler 5  . ASPIRIN EC 81 MG PO TBEC Oral Take 81 mg by mouth every morning.   2  . ATORVASTATIN CALCIUM 10 MG PO TABS Oral Take 10 mg by mouth daily.    Marland Kitchen DIPHENHYDRAMINE-APAP (SLEEP) 25-500 MG PO TABS Oral Take 1 tablet by mouth at bedtime as needed. For pain & sleep    . LEVOTHYROXINE SODIUM 50 MCG PO TABS Oral Take 1 tablet (50 mcg total) by mouth daily before breakfast. 30 tablet 1  . METOPROLOL SUCCINATE ER 50 MG PO TB24 Oral Take 50 mg by mouth daily. Take with or immediately following a meal.    . PAROXETINE HCL 20 MG PO TABS Oral Take 20 mg by mouth every morning.      BP 111/74  Pulse 52  Resp 14  SpO2 99%  Physical Exam  Nursing note and vitals reviewed. Constitutional: She is oriented to person, place, and time. She appears well-developed and well-nourished. No distress.  HENT:  Head: Normocephalic and atraumatic.  Mouth/Throat: Oropharynx is clear and moist.  Eyes: Conjunctivae normal and EOM are normal. Pupils are  equal, round, and reactive to light.  Neck: Normal range of motion. No JVD present.  Cardiovascular: Normal rate, regular rhythm, normal heart sounds and intact distal pulses.   Pulmonary/Chest: Effort normal and breath sounds normal. No stridor. No respiratory distress. She has no wheezes. She has no rales. She exhibits no tenderness.  Abdominal: Soft. Bowel sounds are normal. She exhibits no distension and no mass. There is no tenderness. There is no rebound and no guarding.  Musculoskeletal: Normal range of motion. She exhibits no edema and no tenderness.  Neurological: She is alert and oriented to person, place, and time.  Skin: Skin is warm.  Psychiatric: She has a normal mood and affect.    ED Course  Procedures (including critical care time)  Labs Reviewed  PRO B  NATRIURETIC PEPTIDE - Abnormal; Notable for the following:    Pro B Natriuretic peptide (BNP) 329.1 (*)     All other components within normal limits  COMPREHENSIVE METABOLIC PANEL - Abnormal; Notable for the following:    GFR calc non Af Amer 77 (*)     GFR calc Af Amer 89 (*)     All other components within normal limits  CBC  POCT I-STAT TROPONIN I  POCT I-STAT TROPONIN I   Dg Chest Portable 1 View  09/15/2012  *RADIOLOGY REPORT*  Clinical Data: Chest pain left shoulder pain  PORTABLE CHEST - 1 VIEW  Comparison: 07/20/2012  Findings: Cardiomediastinal silhouette is stable.  No pulmonary edema.  Mild elevation of the right hemidiaphragm. Left basilar small atelectasis or early infiltrate.  IMPRESSION: No pulmonary edema.  Left basilar small atelectasis or early infiltrate.   Original Report Authenticated By: Natasha Mead, M.D.     Date: 09/15/2012  Rate: 132  Rhythm: sinus tachycardia and premature ventricular contractions (PVC) with runs of trigeminy  QRS Axis: normal  Intervals: normal  ST/T Wave abnormalities: normal  Conduction Disutrbances:none  Narrative Interpretation: Abnormal EKG  Old EKG Reviewed: none available   1. Chest pain   2. Premature ventricular contraction       MDM  Patient with atypical chest pain over the course of the last 2 days. EKG is unchanged and troponin is negative. Chest x-ray is clear and BNP is not elevated significantly.  Attending Dr. Ignacia Palma spoke with [facility] cardiology and have recommended that she followup as an outpatient tomorrow.   Pt verbalized understanding and agrees with care plan. Outpatient follow-up and return precautions given.          Wynetta Emery, PA-C 09/15/12 1928

## 2012-09-15 NOTE — ED Notes (Signed)
Per EMS: pt from home c/o CP with inspiration only and left arm pain with mid back pain and RLQ pain starting today; 20g L wrist

## 2012-09-15 NOTE — ED Provider Notes (Signed)
3:51 PM  Date: 09/15/2012  Rate: 132  Rhythm: sinus tachycardia and premature ventricular contractions (PVC) with runs of trigeminy  QRS Axis: normal  Intervals: normal  ST/T Wave abnormalities: normal  Conduction Disutrbances:none  Narrative Interpretation: Abnormal EKG  Old EKG Reviewed: none available    Carleene Cooper III, MD 09/15/12 1552

## 2012-09-15 NOTE — ED Notes (Signed)
Pt c/o lower back pain

## 2012-09-16 NOTE — ED Provider Notes (Signed)
Medical screening examination/treatment/procedure(s) were conducted as a shared visit with non-physician practitioner(s) and myself.  I personally evaluated the patient during the encounter 7:11 PM  Pt with palpitations, normal exam, EKG with PVC's, negative enzymes and chest x-ray. Discussed with Dr. Myrtis Ser of [facility], who will arrange for pt to be called in to the office for recheck with Dr. Johney Frame, her electrophysiologist.       Carleene Cooper III, MD 09/16/12 1230

## 2012-09-17 ENCOUNTER — Encounter: Payer: Self-pay | Admitting: Internal Medicine

## 2012-09-17 ENCOUNTER — Ambulatory Visit (INDEPENDENT_AMBULATORY_CARE_PROVIDER_SITE_OTHER): Payer: Self-pay | Admitting: Internal Medicine

## 2012-09-17 VITALS — BP 113/68 | HR 60 | Ht 68.0 in | Wt 229.8 lb

## 2012-09-17 DIAGNOSIS — I493 Ventricular premature depolarization: Secondary | ICD-10-CM

## 2012-09-17 DIAGNOSIS — I4949 Other premature depolarization: Secondary | ICD-10-CM

## 2012-09-17 DIAGNOSIS — R002 Palpitations: Secondary | ICD-10-CM

## 2012-09-17 DIAGNOSIS — E669 Obesity, unspecified: Secondary | ICD-10-CM

## 2012-09-17 MED ORDER — FLECAINIDE ACETATE 50 MG PO TABS: 50.0000 mg | ORAL_TABLET | Freq: Two times a day (BID) | ORAL | Status: DC

## 2012-09-17 NOTE — Patient Instructions (Addendum)
Your physician recommends that you schedule a follow-up appointment in: 6 weeks with Dr Johney Frame    Your physician has recommended you make the following change in your medication:  1) Start Flecainide 50mg  twice daily after wearing the monitor   Your physician has recommended that you wear a holter monitor. Holter monitors are medical devices that record the heart's electrical activity. Doctors most often use these monitors to diagnose arrhythmias. Arrhythmias are problems with the speed or rhythm of the heartbeat. The monitor is a small, portable device. You can wear one while you do your normal daily activities. This is usually used to diagnose what is causing palpitations/syncope (passing out).---48 hour

## 2012-09-20 NOTE — Progress Notes (Signed)
Primary Care Physician: Delbert Harness, MD Referring Physician:   Dr Malachi Paradise [name] is a 56 y.o. female with a h/o PVCs who presents today for EP follow-up.  She was seen be me several weeks ago for symptomatic PVCs and NSVT.  Several days later, she presented to [facility] ER with ongoing PVCs.  She reports breathlessness associated with her PVCs.  She feels that she has actually done better since she left the ER but continues to have palpitations.  Previously, an echo revealed no structural heart disease.  She denies FH of arrhythmias.  Today, she denies symptoms of chest pain,  dizziness, presyncope, syncope, or neurologic sequela. The patient is tolerating medications without difficulties and is otherwise without complaint today.   Past Medical History  Diagnosis Date  . Myocardial infarction 1997  . Coronary artery disease   . Hypertension   . Arthritis   . Problem with literacy   . Shortness of breath   . Anxiety   . Bronchitis   . Chronic headaches   . Obesity    Past Surgical History  Procedure Date  . Ectopic pregnancy surgery     right    Current Outpatient Prescriptions  Medication Sig Dispense Refill  . albuterol (PROVENTIL HFA;VENTOLIN HFA) 108 (90 BASE) MCG/ACT inhaler Inhale 2 puffs into the lungs every 2 (two) hours as needed for wheezing or shortness of breath (cough).  1 Inhaler  5  . aspirin EC 81 MG tablet Take 81 mg by mouth every morning.     2  . atorvastatin (LIPITOR) 10 MG tablet Take 10 mg by mouth daily.      . diphenhydramine-acetaminophen (TYLENOL PM) 25-500 MG TABS Take 1 tablet by mouth at bedtime as needed. For pain & sleep      . metoprolol succinate (TOPROL-XL) 50 MG 24 hr tablet Take 50 mg by mouth daily. Take with or immediately following a meal.      . oxyCODONE-acetaminophen (PERCOCET/ROXICET) 5-325 MG per tablet 1 to 2 tabs PO q6hrs  PRN for pain  5 tablet  0  . PARoxetine (PAXIL) 20 MG tablet Take 20 mg by mouth every morning.       . flecainide (TAMBOCOR) 50 MG tablet Take 1 tablet (50 mg total) by mouth 2 (two) times daily.  180 tablet  3  . DISCONTD: furosemide (LASIX) 20 MG tablet Take 20 mg by mouth daily.        Allergies  Allergen Reactions  . Penicillins Rash    History   Social History  . Marital Status: Legally Separated    Spouse Name: N/A    Number of Children: N/A  . Years of Education: N/A   Occupational History  . Not on file.   Social History Main Topics  . Smoking status: Former Smoker -- 1.0 packs/day for 3 years    Quit date: 12/03/2010  . Smokeless tobacco: Not on file  . Alcohol Use: No  . Drug Use: No  . Sexually Active: No   Other Topics Concern  . Not on file   Social History Narrative   Lives with a female friend, just roommates, separated.  Has 4 kids, all grown.  Finished 10th grade.  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  Lives in [place]    Family History  Problem Relation Age of Onset  . Cancer Mother 34    lung ca  . Diabetes Mother   . Anxiety disorder Sister   .  Cancer Brother 60    lung    ROS- All systems are reviewed and negative except as per the HPI above  Physical Exam: Filed Vitals:   09/17/12 1113  BP: 113/68  Pulse: 60  Height: 5\' 8"  (1.727 m)  Weight: 229 lb 12.8 oz (104.237 kg)    GEN- The patient is morbidly obese appearing, alert and oriented x 3 today.   Head- normocephalic, atraumatic Eyes-  Sclera clear, conjunctiva pink Ears- hearing intact Oropharynx- clear Neck- supple, no JVP Lymph- no cervical lymphadenopathy Lungs- Clear to ausculation bilaterally, normal work of breathing Heart- Regular rate and rhythm, no murmurs, rubs or gallops, PMI not laterally displaced GI- soft, NT, ND, + BS Extremities- no clubbing, cyanosis, or edema MS- no significant deformity or atrophy Skin- no rash or lesion Psych- euthymic mood, full affect Neuro- strength and sensation are intact  EKG from ER reveals sinus bradycardia  with PVCs in couplets.  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

## 2012-09-20 NOTE — Assessment & Plan Note (Signed)
Weight loss advised 

## 2012-09-20 NOTE — Assessment & Plan Note (Signed)
As above.

## 2012-09-20 NOTE — Assessment & Plan Note (Signed)
She continues to have palpitations/ symptomatic PVCs and NSVT. At this time, I will place a holter monitor to characterize the frequency of her ectopy.  I will then start flecainide 50mg  BID. She will return in 4-6 weeks. She is clear that she would like to avoid ablation at this time.

## 2012-09-29 ENCOUNTER — Encounter (INDEPENDENT_AMBULATORY_CARE_PROVIDER_SITE_OTHER): Payer: Medicaid Other

## 2012-09-29 DIAGNOSIS — I493 Ventricular premature depolarization: Secondary | ICD-10-CM

## 2012-09-29 DIAGNOSIS — R002 Palpitations: Secondary | ICD-10-CM

## 2012-10-05 ENCOUNTER — Emergency Department (HOSPITAL_COMMUNITY): Payer: Medicaid Other

## 2012-10-05 ENCOUNTER — Observation Stay (HOSPITAL_COMMUNITY)
Admission: EM | Admit: 2012-10-05 | Discharge: 2012-10-07 | Disposition: A | Discharge status: Home / Self Care | Payer: Medicaid Other | Attending: Family Medicine | Admitting: Family Medicine

## 2012-10-05 ENCOUNTER — Encounter (HOSPITAL_COMMUNITY): Payer: Self-pay | Admitting: *Deleted

## 2012-10-05 DIAGNOSIS — R002 Palpitations: Secondary | ICD-10-CM | POA: Diagnosis present

## 2012-10-05 DIAGNOSIS — I4729 Other ventricular tachycardia: Secondary | ICD-10-CM | POA: Insufficient documentation

## 2012-10-05 DIAGNOSIS — I252 Old myocardial infarction: Secondary | ICD-10-CM | POA: Insufficient documentation

## 2012-10-05 DIAGNOSIS — I472 Ventricular tachycardia, unspecified: Secondary | ICD-10-CM | POA: Insufficient documentation

## 2012-10-05 DIAGNOSIS — J4489 Other specified chronic obstructive pulmonary disease: Secondary | ICD-10-CM | POA: Insufficient documentation

## 2012-10-05 DIAGNOSIS — I4949 Other premature depolarization: Secondary | ICD-10-CM

## 2012-10-05 DIAGNOSIS — E039 Hypothyroidism, unspecified: Secondary | ICD-10-CM | POA: Insufficient documentation

## 2012-10-05 DIAGNOSIS — R0789 Other chest pain: Principal | ICD-10-CM | POA: Insufficient documentation

## 2012-10-05 DIAGNOSIS — E785 Hyperlipidemia, unspecified: Secondary | ICD-10-CM | POA: Insufficient documentation

## 2012-10-05 DIAGNOSIS — R072 Precordial pain: Secondary | ICD-10-CM | POA: Diagnosis present

## 2012-10-05 DIAGNOSIS — J449 Chronic obstructive pulmonary disease, unspecified: Secondary | ICD-10-CM

## 2012-10-05 DIAGNOSIS — R079 Chest pain, unspecified: Secondary | ICD-10-CM

## 2012-10-05 DIAGNOSIS — R0602 Shortness of breath: Secondary | ICD-10-CM | POA: Insufficient documentation

## 2012-10-05 DIAGNOSIS — F411 Generalized anxiety disorder: Secondary | ICD-10-CM | POA: Insufficient documentation

## 2012-10-05 DIAGNOSIS — E669 Obesity, unspecified: Secondary | ICD-10-CM

## 2012-10-05 DIAGNOSIS — I493 Ventricular premature depolarization: Secondary | ICD-10-CM

## 2012-10-05 LAB — CBC
MCH: 27.8 pg (ref 26.0–34.0)
MCHC: 32.3 g/dL (ref 30.0–36.0)
MCV: 86.1 fL (ref 78.0–100.0)
Platelets: 210 10*3/uL (ref 150–400)
RDW: 14.3 % (ref 11.5–15.5)

## 2012-10-05 LAB — COMPREHENSIVE METABOLIC PANEL
ALT: 12 U/L (ref 0–35)
AST: 18 U/L (ref 0–37)
Alkaline Phosphatase: 80 U/L (ref 39–117)
CO2: 27 mEq/L (ref 19–32)
GFR calc Af Amer: 89 mL/min — ABNORMAL LOW (ref >90)
GFR calc non Af Amer: 77 mL/min — ABNORMAL LOW (ref >90)
Glucose, Bld: 93 mg/dL (ref 70–99)
Potassium: 3.9 mEq/L (ref 3.5–5.1)
Sodium: 137 mEq/L (ref 135–145)

## 2012-10-05 LAB — CBC WITH DIFFERENTIAL/PLATELET
Basophils Relative: 1 % (ref 0–1)
Eosinophils Absolute: 0.3 10*3/uL (ref 0.0–0.7)
Lymphs Abs: 2.9 10*3/uL (ref 0.7–4.0)
MCH: 28.2 pg (ref 26.0–34.0)
Neutrophils Relative %: 52 % (ref 43–77)
Platelets: 260 10*3/uL (ref 150–400)
RBC: 4.68 MIL/uL (ref 3.87–5.11)
WBC: 7.9 10*3/uL (ref 4.0–10.5)

## 2012-10-05 LAB — LIPID PANEL
LDL Cholesterol: 89 mg/dL (ref 0–99)
Total CHOL/HDL Ratio: 3.9 RATIO

## 2012-10-05 LAB — POCT I-STAT, CHEM 8
Calcium, Ion: 1.19 mmol/L (ref 1.12–1.23)
Glucose, Bld: 88 mg/dL (ref 70–99)
HCT: 42 % (ref 36.0–46.0)
Hemoglobin: 14.3 g/dL (ref 12.0–15.0)
Potassium: 3.9 mEq/L (ref 3.5–5.1)
TCO2: 24 mmol/L (ref 0–100)

## 2012-10-05 LAB — APTT: aPTT: 27 seconds (ref 24–37)

## 2012-10-05 LAB — PROTIME-INR: Prothrombin Time: 13.5 seconds (ref 11.6–15.2)

## 2012-10-05 MED ORDER — ACETAMINOPHEN 650 MG RE SUPP: 650.0000 mg | Freq: Four times a day (QID) | RECTAL | Status: DC | PRN

## 2012-10-05 MED ORDER — MORPHINE SULFATE 2 MG/ML IJ SOLN
2.0000 mg | INTRAMUSCULAR | Status: DC | PRN
  Administered 2012-10-07 (×2): 2 mg via INTRAVENOUS
  Filled 2012-10-05 (×2): qty 1

## 2012-10-05 MED ORDER — SODIUM CHLORIDE 0.9 % IV SOLN
Freq: Once | INTRAVENOUS | Status: AC
  Administered 2012-10-05: 75 mL/h via INTRAVENOUS

## 2012-10-05 MED ORDER — ALBUTEROL SULFATE HFA 108 (90 BASE) MCG/ACT IN AERS: 2.0000 | INHALATION_SPRAY | RESPIRATORY_TRACT | Status: DC | PRN

## 2012-10-05 MED ORDER — ACETAMINOPHEN 325 MG PO TABS
650.0000 mg | ORAL_TABLET | Freq: Four times a day (QID) | ORAL | Status: DC | PRN
  Administered 2012-10-05 – 2012-10-06 (×3): 650 mg via ORAL
  Filled 2012-10-05 (×3): qty 2

## 2012-10-05 MED ORDER — INFLUENZA VIRUS VACC SPLIT PF IM SUSP
0.5000 mL | INTRAMUSCULAR | Status: AC
  Administered 2012-10-06: 0.5 mL via INTRAMUSCULAR
  Filled 2012-10-05: qty 0.5

## 2012-10-05 MED ORDER — PAROXETINE HCL 20 MG PO TABS
20.0000 mg | ORAL_TABLET | Freq: Every day | ORAL | Status: DC
  Administered 2012-10-05 – 2012-10-07 (×3): 20 mg via ORAL
  Filled 2012-10-05 (×3): qty 1

## 2012-10-05 MED ORDER — PANTOPRAZOLE SODIUM 40 MG PO TBEC
40.0000 mg | DELAYED_RELEASE_TABLET | Freq: Every day | ORAL | Status: DC
  Administered 2012-10-05 – 2012-10-07 (×3): 40 mg via ORAL
  Filled 2012-10-05 (×5): qty 1

## 2012-10-05 MED ORDER — ONDANSETRON HCL 4 MG/2ML IJ SOLN: 4.0000 mg | Freq: Four times a day (QID) | INTRAMUSCULAR | Status: DC | PRN

## 2012-10-05 MED ORDER — ATORVASTATIN CALCIUM 10 MG PO TABS
10.0000 mg | ORAL_TABLET | Freq: Every day | ORAL | Status: DC
  Administered 2012-10-05 – 2012-10-07 (×3): 10 mg via ORAL
  Filled 2012-10-05 (×3): qty 1

## 2012-10-05 MED ORDER — ASPIRIN EC 81 MG PO TBEC
81.0000 mg | DELAYED_RELEASE_TABLET | Freq: Every morning | ORAL | Status: DC
  Administered 2012-10-05 – 2012-10-07 (×3): 81 mg via ORAL
  Filled 2012-10-05 (×3): qty 1

## 2012-10-05 MED ORDER — HEPARIN SODIUM (PORCINE) 5000 UNIT/ML IJ SOLN
5000.0000 [IU] | Freq: Three times a day (TID) | INTRAMUSCULAR | Status: DC
  Administered 2012-10-05 – 2012-10-07 (×7): 5000 [IU] via SUBCUTANEOUS
  Filled 2012-10-05 (×9): qty 1

## 2012-10-05 MED ORDER — ONDANSETRON HCL 8 MG PO TABS
4.0000 mg | ORAL_TABLET | Freq: Four times a day (QID) | ORAL | Status: DC | PRN
  Filled 2012-10-05: qty 0.5

## 2012-10-05 MED ORDER — METOPROLOL SUCCINATE ER 50 MG PO TB24
50.0000 mg | ORAL_TABLET | Freq: Every day | ORAL | Status: DC
  Administered 2012-10-07: 50 mg via ORAL
  Filled 2012-10-05 (×3): qty 1

## 2012-10-05 MED ORDER — FLECAINIDE ACETATE 50 MG PO TABS
50.0000 mg | ORAL_TABLET | Freq: Two times a day (BID) | ORAL | Status: DC
  Administered 2012-10-05 – 2012-10-07 (×5): 50 mg via ORAL
  Filled 2012-10-05 (×6): qty 1

## 2012-10-05 NOTE — H&P (Signed)
Family Medicine Teaching [facility] Admission History and Physical Service Pager: [phone]  Patient name: [name] Medical record number: [id number] Date of birth: 08/15/1956 Age: 56 y.o. Gender: female  Primary Care Provider: Delbert Harness, MD  Chief Complaint: right-sided/substernal chest pain  Assessment and Plan: [name] is a 56 y.o. year old female with PMH significant for pt-reported MI (cath in October 2012 without definite CAD, normal EF; echo in January 2013 with normal EF, preserved LV function, and grade 1 diastolic dysfuntion), anxiety, COPD, and HLD presenting with right-sided intermittent chest pain for about 12 hours. In the ED, pt has had resolution of chest pain with residual epigastric/substernal soreness. Minimal reproducability of chest pain. POC Troponin negative, EKG shows borderline sinus bradycardia and no PVC's (which were seen on previous EKGs). CXR negative for acute abnormality. Otherwise, CBC, BMP, and PT/INR results are all WNL.  1. Chest pain/?history of MI - Uncertain etiology at this point, DDx including cardiac related, GERD, MSK, COPD. Pt with recent echo and cath concerning only for grade 1 diastolic failure. EKG reassuring/non-ischemic.Current chest pain is mostly resolved and pt appears comfortable. No evidence for fluid overload. Plan - Continue daily ASA, lipitor, metoprolol, flecainide. Will cycle troponins x3 and check risk-stratification labs (lipid panel, hemoglobin A1c, TSH). Continuous cardiac monitoring. Morphine 2 mg IV q2h PRN for pain, Zofran for nausea.  2. COPD - No current difficulty breathing, O2 sats >92% on RA. Will order albuterol PRN for wheezing/cough/SOB.  3. Hyperlipidemia - Currently taking Lipitor 10 daily. Continue. Fasting lipid panel, as above.  4. Anxiety - No current symptoms. Continue home Paxil.  FEN/GI: Heart healthy diet, saline lock IV Prophylaxis: SubQ heparin, Protonix Disposition: Observation  with management as above. Code Status: Full code  History of Present Illness: [name] is a 56 y.o. year old female with PMH significant for pt-reported MI (cath in October 2012 without definite CAD, normal EF; echo in January 2013 with normal EF, preserved LV function, and grade 1 diastolic dysfuntion), anxiety, COPD, and HLD presenting with right-sided intermittent chest pain for about 12 hours. Pt reports she had sudden-onset chest pain/pressure after church last night that "came and went" and steadily got worse until calling EMS. Prior to calling EMS, she took 4 baby aspirin with some mild relief. En route to the ED, pt had two nitroglycerin also with some relief of chest pain. She describes some increased SOB from baseline during her chest pain, but no diaphoresis or true radiation; she describes the pain as sudden in onset in her right chest, some fading of pain, then worsening substernal/epigastric pain. The pain did not radiate to her back, but pt does describe some vague left shoulder pain which seems to be present "most of the time." She also had some nausea with chest pain, but no vomiting. At time of interview, pt has no more active chest pain but still has some substernal/epigastric soreness.  Otherwise, pt denies fever/chills, SOB, vomiting/diarrhea, cough.  Of note, pt had a similar presentation to the ED on 10/1 with EKG showing PVC's/trigemeny and pt was then seen by Dr. Johney Frame, her cardiologist, who diagnosed her with frequent/symptomatic PVC's/NSVT and started flecainide. Pt recently had 48-hour Holter monitoring per Dr. Johney Frame but does not know results yet. Per Dr. Jenel Lucks note, pt has expressed wish to avoid ablation.  Patient Active Problem List  Diagnosis  . Obesity  . Palpitations  . History of myocardial infarction  . Hypothyroidism  . Hyperlipidemia LDL goal <  100  . Arm pain  . Back pain  . Tremor  . Insomnia  . Dyspnea  . Chest pain  . Anxiety  . Dental  caries  . Myalgia  . Vitamin d deficiency  . COPD (chronic obstructive pulmonary disease)  . Patellofemoral disorder of right knee  . Chest pain  . Abnormal CT scan, bladder  . Premature ventricular contraction   Past Medical History: Past Medical History  Diagnosis Date  . Myocardial infarction 1997  . Coronary artery disease   . Hypertension   . Arthritis   . Problem with literacy   . Shortness of breath   . Anxiety   . Bronchitis   . Chronic headaches   . Obesity    Past Surgical History: Past Surgical History  Procedure Date  . Ectopic pregnancy surgery     right   Social History: History  Substance Use Topics  . Smoking status: Former Smoker -- 1.0 packs/day for 3 years    Quit date: 12/03/2010  . Smokeless tobacco: Not on file  . Alcohol Use: No   For any additional social history documentation, please refer to relevant sections of EMR.  Family History: Family History  Problem Relation Age of Onset  . Cancer Mother 47    lung ca  . Diabetes Mother   . Anxiety disorder Sister   . Cancer Brother 60    lung   Allergies: Allergies  Allergen Reactions  . Penicillins Rash   No current facility-administered medications on file prior to encounter.   Current Outpatient Prescriptions on File Prior to Encounter  Medication Sig Dispense Refill  . albuterol (PROVENTIL HFA;VENTOLIN HFA) 108 (90 BASE) MCG/ACT inhaler Inhale 2 puffs into the lungs every 2 (two) hours as needed for wheezing or shortness of breath (cough).  1 Inhaler  5  . aspirin EC 81 MG tablet Take 81 mg by mouth every morning.     2  . atorvastatin (LIPITOR) 10 MG tablet Take 10 mg by mouth daily.      . diphenhydramine-acetaminophen (TYLENOL PM) 25-500 MG TABS Take 1 tablet by mouth at bedtime as needed. For pain & sleep      . flecainide (TAMBOCOR) 50 MG tablet Take 1 tablet (50 mg total) by mouth 2 (two) times daily.  180 tablet  3  . metoprolol succinate (TOPROL-XL) 50 MG 24 hr tablet Take  50 mg by mouth daily. Take with or immediately following a meal.      . oxyCODONE-acetaminophen (PERCOCET/ROXICET) 5-325 MG per tablet 1 to 2 tabs PO q6hrs  PRN for pain  5 tablet  0  . PARoxetine (PAXIL) 20 MG tablet Take 20 mg by mouth every morning.      Marland Kitchen DISCONTD: furosemide (LASIX) 20 MG tablet Take 20 mg by mouth daily.       Review Of Systems: Per HPI.  Physical Exam: BP 100/60  Temp 97.8 F (36.6 C)  Resp 16  SpO2 97% Exam: General: adult female lying in bed, in NAD, cooperative HEENT: MMM, EOMI, /AT Cardiovascular: RRR, no murmur appreciated Chest: minimal reproduciblity of chest pain to right upper chest; no reproducible sternal pain Respiratory: CTAB, soft crackles at bibasilar fields that clear slightly with deep cough Abdomen: soft, nontender, mild "soreness" in epigastrium, BS+ Extremities: warm, well-perfused, distal pulses intact/symmetric Skin: warm, dry, intact; healing abrasion to dorsum of left foot Neuro: muscle strength equal bilaterally, no gross focal deficit, alert/oriented  Laboratory:  Lab 10/05/12 0307  NA 139  K 3.9  CL 101  CO2 --  BUN 20  CREATININE 1.00  LABGLOM --  GLUCOSE 88  CALCIUM --    Lab 10/05/12 0307 10/05/12 0251  WBC -- 7.9  HGB 14.3 13.2  HCT 42.0 40.3  PLT -- 260   POC Troponin 10/21 @0305 : negative EKG: borderline sinus bradycardia (rate 58), no ischemic changes; PVC's absent (present on prior from 10/1)  CXR, 10/21 @0305  Findings: Single view of the chest demonstrates clear lungs.  Stable appearance of the heart and mediastinum. Trachea is  midline.  IMPRESSION:  No acute findings.  Street, [place], MD 10/05/2012, 5:10 AM  PGY-3 Addendum  Patient seen and examined along with Dr. Casper Harrison.  Agree with above exam findings and assessment and plan as outlined above.  Unsure of history of MI as she is poor historian. Clean cath in 2012.  Chest pain atypical in nature but agree with MI rule out and further risk  stratification given history of possible CAD and hx of dysrhthmia.    Everrett Coombe, DO

## 2012-10-05 NOTE — Progress Notes (Signed)
Pt seen at bedside around 0930. Feeling well, but with some occasional discomfort when her "heart races." Per nursing, pt has had a couple of episodes of non-sustained Vtach that correlate with chest discomfort. Pt's blood pressure also slightly low this morning, 94/57 with pulse 58.  Will hold metoprolol this morning. Continue flecainide as scheduled. Cardiology has been consulted; assistance is appreciated. Will monitor and await further recommendations, at this time.  Bobbye Morton, MD PGY-1, [facility] Family Medicine FPTS Intern pager: [phone]

## 2012-10-05 NOTE — ED Notes (Signed)
Pt arrived via [facility] from c/o progressively worsening non radiating movement dependent CP describe as sharp exacerbated by deep inspiratory breath. EMS 12 lead unremarkable, Nitro x 3, and 324 ASA administered prior to arrival. Hx: MI, anxiety, COPD, HTN, Asthma, Hyperlipidemia

## 2012-10-05 NOTE — Consult Note (Signed)
CARDIOLOGY CONSULT NOTE   Patient ID: [name] MRN: [id number] DOB/AGE: July 24, 1956 56 y.o.  Admit date: 10/05/2012  Primary Physician   Delbert Harness, MD Primary Cardiologist   SK in '96; now [place]/JA Reason for Consultation   NSVT  [id number] [name] is a 56 y.o. female with a history of CAD.  She was started on Flecainide 50 mg BID 10/3 for frequent PVCs and NSVT. She is followed by Dr. Algie Coffer for coronary artery disease.   Yesterday, she was in her usual state of health. She was playing cards with friends and not exerting herself when she had sudden onset of sharp, stabbing chest pain. It was in the mid epigastric area at the distal end of her sternum. It was worse at times with deep inspiration. She has chronic dyspnea on exertion with minimal exertion and that was unchanged. She was slightly nauseated but had no vomiting. The pain seems slightly worse with cough. It seemed to improve with being in an upright position. It reached a 10/10. EMS was called. They gave her sublingual nitroglycerin x2 with partial relief. The pain was down to 5/10. It continued to improve after being in the emergency room and resolved in about 2 hours without further medical intervention. She has had episodes of chest pain in the past, none exactly like this one. She has had chest pain in association with palpitations but did not have any palpitations yesterday. On telemetry, she was noted to have runs of nonsustained VT, up to 8 beats. She has occasional episodes of dizziness associated with palpitations, but this has improved significantly since being on flecainide. She has noticed the palpitations at times since being in the hospital, but has had no other symptoms associated with them.   Past Medical History  Diagnosis Date  . Myocardial infarction 1997  . Coronary artery disease   . Hypertension   . Arthritis   . Problem with literacy   . Shortness of breath   . Anxiety   . Bronchitis     . Chronic headaches   . Obesity      Past Surgical History  Procedure Date  . Ectopic pregnancy surgery     right    Allergies  Allergen Reactions  . Penicillins Rash    I have reviewed the patient's current medications    . sodium chloride   Intravenous Once  . aspirin EC  81 mg Oral q morning - 10a  . atorvastatin  10 mg Oral Daily  . flecainide  50 mg Oral BID  . heparin  5,000 Units Subcutaneous Q8H  . influenza  inactive virus vaccine  0.5 mL Intramuscular Tomorrow-1000  . metoprolol succinate  50 mg Oral Daily  . pantoprazole  40 mg Oral Q1200  . PARoxetine  20 mg Oral Daily     acetaminophen, acetaminophen, albuterol, morphine injection, ondansetron (ZOFRAN) IV, ondansetron  Medication Sig  albuterol (PROVENTIL HFA;VENTOLIN HFA) 108 (90 BASE) MCG/ACT inhaler Inhale 2 puffs into the lungs every 2 (two) hours as needed for wheezing or shortness of breath (cough).  aspirin EC 81 MG tablet Take 81 mg by mouth every morning.   atorvastatin (LIPITOR) 10 MG tablet Take 10 mg by mouth daily.  diphenhydramine-acetaminophen (TYLENOL PM) 25-500 MG TABS Take 1 tablet by mouth at bedtime as needed. For pain & sleep  flecainide (TAMBOCOR) 50 MG tablet Take 1 tablet (50 mg total) by mouth 2 (two) times daily.  metoprolol succinate (TOPROL-XL) 50 MG 24 hr  tablet Take 50 mg by mouth daily. Take with or immediately following a meal.  oxyCODONE-acetaminophen (PERCOCET/ROXICET) 5-325 MG per tablet 1 to 2 tabs PO q6hrs  PRN for pain  PARoxetine (PAXIL) 20 MG tablet Take 20 mg by mouth every morning.     History   Social History  . Marital Status: Legally Separated    Spouse Name: N/A    Number of Children: N/A  . Years of Education: N/A   Occupational History  . Disabled secondary to DOE/COPD and daytime LE swelling.   Social History Main Topics  . Smoking status: Former Smoker -- 1.0 packs/day for 3 years    Quit date: 12/03/2010  . Smokeless tobacco: Not on file  .  Alcohol Use: No  . Drug Use: No  . Sexually Active: No   Other Topics Concern  .    Social History Narrative   Lives with a female friend, just roommates, separated.  Has 4 kids, all grown.  Finished 10th grade.  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  Lives in [place]     Family History  Problem Relation Age of Onset  . Cancer Mother 75    lung ca  . Diabetes Mother   . Anxiety disorder Sister   . Cancer Brother 60    lung     ROS: She has chronic dyspnea on exertion walking room-room, but this has not changed recently. She has not had any recent illnesses, fevers or chills. She occasionally gets episodes of hot/cold. She has chronic musculoskeletal aches and pains. In general, she is felt at baseline recently. Full 14 point review of systems complete and found to be negative unless listed above.  Physical Exam: Blood pressure 98/56, pulse 56, temperature 98.1 F (36.7 C), temperature source Oral, resp. rate 18, height 5\' 8"  (1.727 m), weight 229 lb 11.5 oz (104.2 kg), SpO2 97.00%.  General: Well developed, well nourished, female in no acute distress Head: Eyes PERRLA, No xanthomas.   Normocephalic and atraumatic, oropharynx without edema or exudate. Dentition: Poor Lungs: bilateral basilar Rales, possibly a few crackles, chest wall nontender Heart: Heart slightly irregular rate and rhythm with S1, S2, no significant  murmur. pulses are 2+ all 4 extrem.   Neck: No carotid bruits. No lymphadenopathy.  JVD not elevated. Abdomen: Bowel sounds present, abdomen soft and non-tender without masses or hernias noted. Msk:  No spine or cva tenderness. No weakness, no joint deformities or effusions. Extremities: No clubbing or cyanosis. No edema.  Neuro: Alert and oriented X 3. No focal deficits noted. Psych:  Good affect, responds appropriately Skin: No rashes or lesions noted.  Labs:   Lab Results  Component Value Date   WBC 7.2 10/05/2012   HGB 12.0 10/05/2012   HCT  37.2 10/05/2012   MCV 86.1 10/05/2012   PLT 210 10/05/2012    Basename 10/05/12 0251  INR 1.04     Lab 10/05/12 0532  NA 137  K 3.9  CL 104  CO2 27  BUN 19  CREATININE 0.84  CALCIUM 8.8  PROT 6.4  BILITOT 0.4  ALKPHOS 80  ALT 12  AST 18  GLUCOSE 93    Basename 10/05/12 1055 10/05/12 0527  CKTOTAL -- --  CKMB -- --  TROPONINI <0.30 <0.30    Basename 10/05/12 0305  TROPIPOC 0.00   Lab Results  Component Value Date   CHOL 153 10/05/2012   HDL 39* 10/05/2012   LDLCALC 89 10/05/2012   TRIG 126  10/05/2012   TSH  Date/Time Value Range Status  10/05/2012  5:32 AM 33.170* 0.350 - 4.500 uIU/mL Final   Echo: 12/26/2011 Study Conclusions - Left ventricle: The cavity size was normal. There was mild concentric hypertrophy. Systolic function was normal. The estimated ejection fraction was in the range of 55% to 60%. Wall motion was normal; there were no regional wall motion abnormalities. Doppler parameters are consistent with abnormal left ventricular relaxation (grade 1 diastolic dysfunction). - Pericardium, extracardiac: A trivial pericardial effusion was identified posterior to the heart.  ECG:  05-Oct-2012 07:20:11 Sinus bradycardia Otherwise normal ECG 37mm/s 66mm/mV 100Hz  8.0.1 12SL 241 HD CID: 1 Referred by: BRIAN MILLER Unconfirmed Vent. rate 52 BPM PR interval 154 ms QRS duration 90 ms QT/QTc 498/463 ms P-R-T axes 51 24 42  Radiology:  Dg Chest Port 1 View 10/05/2012  *RADIOLOGY REPORT*  Clinical Data: Right-sided chest pain.  PORTABLE CHEST - 1 VIEW  Comparison: 09/15/2012  Findings: Single view of the chest demonstrates clear lungs. Stable appearance of the heart and mediastinum.  Trachea is midline.  IMPRESSION: No acute findings.   Original Report Authenticated By: Richarda Overlie, M.D.    Dg Chest Portable 1 View 09/15/2012  *RADIOLOGY REPORT*  Clinical Data: Chest pain left shoulder pain  PORTABLE CHEST - 1 VIEW  Comparison: 07/20/2012  Findings:  Cardiomediastinal silhouette is stable.  No pulmonary edema.  Mild elevation of the right hemidiaphragm. Left basilar small atelectasis or early infiltrate.  IMPRESSION: No pulmonary edema.  Left basilar small atelectasis or early infiltrate.   Original Report Authenticated By: Natasha Mead, M.D.    Lexiscan Myoview 06/13/2012 IMPRESSION: 1. No evidence for pharmacologically induced myocardial ischemia. 2. Inferolateral hypokinesis, new from previous exam. 3. Left ventricular ejection fraction equals 58%. This is decreased from 70% previously.   ASSESSMENT AND PLAN:   The patient was seen today by Dr Excell Seltzer, the patient evaluated and the data reviewed.  Active Problems:  Palpitations - pt has Hx NSVT and is on flecainide for this. Since being on the flecainide, her palpitations have significantly improved. Although she is aware of her arrhythmia and has had some short runs of nonsustained VT since being hospitalized, there are no discernible symptoms associated with these and it is likely not responsible for her chest pain. Recommend continuing the flecainide, with the dose increased per M.D., and followup with Dr. Johney Frame as an outpatient   Substernal chest pain - atypical pain and cardiac enzymes have been negative. She has been followed by Dr. Algie Coffer for general cardiology issues. She has a recent negative Myoview and her EF is preserved. No further cardiac workup is recommended at this time, follow up with Dr Algie Coffer.  Otherwise, per primary M.D.  Signed: Theodore Demark 10/05/2012, 2:20 PM Co-Sign MD  Patient seen, examined. Available data reviewed. Agree with findings, assessment, and plan as outlined by Theodore Demark, PA-C. The patient was independently interviewed and examined. Her physical exam is pertinent for an overweight woman in no acute distress. Her heart sounds are regular without murmur or gallop. There is no peripheral edema. Her EKG and telemetry strips were personally  reviewed. She continues to have frequent nonsustained ventricular tachycardia and PVCs. I have reviewed all available data including her lab work and recent imaging studies. She has a history of normal coronary arteries by cardiac catheterization many years ago. A recent Myoview stress test within the past 6 months was negative for ischemia. I think her chest pain is highly atypical and does  not warrant further cardiac workup. Will ask Dr. Johney Frame to evaluate her in the morning and determine whether to adjust her flecainide for suppression of nonsustained VT. Symptomatically she is much better since starting flecainide. I notified Dr Algie Coffer, her general cardiologist, of the patient's hospital admission.  Tonny Bollman, M.D. 10/05/2012 2:48 PM

## 2012-10-05 NOTE — ED Provider Notes (Signed)
History     CSN: [id number]  Arrival date & time 10/05/12  0214   First MD Initiated Contact with Patient 10/05/12 0224      Chief Complaint  Patient presents with  . Chest Pain    (Consider location/radiation/quality/duration/timing/severity/associated sxs/prior treatment) HPI Comments: 56 year old female who presents with a complaint of right-sided chest pain which is also sternal in nature. She states this started at 8:30 PM, it has been intermittent, pressure, nonradiating and associated with mild shortness of breath. She endorses a history of myocardial infarction which occurred approximately 30 years ago, she had nitroglycerin and aspirin prior to arrival with minimal improvement. The patient denies any swelling of the legs this evening but has had chronic mild swelling. She has had a normal heart catheterization in October of 2012 showing normal coronary arteries without any significant occlusions and a normal ejection fraction. She is followed by Dr. Johney Frame for her frequent PVCs and some episodes of nonsustained ventricular tachycardia. She has refused ablation in the past.  Patient is a 56 y.o. female presenting with chest pain. The history is provided by the patient and medical records.  Chest Pain     Past Medical History  Diagnosis Date  . Myocardial infarction 1997  . Coronary artery disease   . Hypertension   . Arthritis   . Problem with literacy   . Shortness of breath   . Anxiety   . Bronchitis   . Chronic headaches   . Obesity     Past Surgical History  Procedure Date  . Ectopic pregnancy surgery     right    Family History  Problem Relation Age of Onset  . Cancer Mother 58    lung ca  . Diabetes Mother   . Anxiety disorder Sister   . Cancer Brother 60    lung    History  Substance Use Topics  . Smoking status: Former Smoker -- 1.0 packs/day for 3 years    Quit date: 12/03/2010  . Smokeless tobacco: Not on file  . Alcohol Use: No    OB  History    Grav Para Term Preterm Abortions TAB SAB Ect Mult Living                  Review of Systems  Cardiovascular: Positive for chest pain.  All other systems reviewed and are negative.    Allergies  Penicillins  Home Medications   Current Outpatient Rx  Name Route Sig Dispense Refill  . ALBUTEROL SULFATE HFA 108 (90 BASE) MCG/ACT IN AERS Inhalation Inhale 2 puffs into the lungs every 2 (two) hours as needed for wheezing or shortness of breath (cough). 1 Inhaler 5  . ASPIRIN EC 81 MG PO TBEC Oral Take 81 mg by mouth every morning.   2  . ATORVASTATIN CALCIUM 10 MG PO TABS Oral Take 10 mg by mouth daily.    Marland Kitchen DIPHENHYDRAMINE-APAP (SLEEP) 25-500 MG PO TABS Oral Take 1 tablet by mouth at bedtime as needed. For pain & sleep    . FLECAINIDE ACETATE 50 MG PO TABS Oral Take 1 tablet (50 mg total) by mouth 2 (two) times daily. 180 tablet 3  . METOPROLOL SUCCINATE ER 50 MG PO TB24 Oral Take 50 mg by mouth daily. Take with or immediately following a meal.    . OXYCODONE-ACETAMINOPHEN 5-325 MG PO TABS  1 to 2 tabs PO q6hrs  PRN for pain 5 tablet 0  . PAROXETINE HCL 20 MG PO TABS Oral  Take 20 mg by mouth every morning.      BP 100/60  Temp 97.8 F (36.6 C)  Resp 16  SpO2 97%  Physical Exam  Nursing note and vitals reviewed. Constitutional: She appears well-developed and well-nourished. No distress.  HENT:  Head: Normocephalic and atraumatic.  Mouth/Throat: Oropharynx is clear and moist. No oropharyngeal exudate.  Eyes: Conjunctivae normal and EOM are normal. Pupils are equal, round, and reactive to light. Right eye exhibits no discharge. Left eye exhibits no discharge. No scleral icterus.  Neck: Normal range of motion. Neck supple. No JVD present. No thyromegaly present.  Cardiovascular: Normal rate, regular rhythm, normal heart sounds and intact distal pulses.  Exam reveals no gallop and no friction rub.   No murmur heard. Pulmonary/Chest: Effort normal and breath sounds  normal. No respiratory distress. She has no wheezes. She has no rales.  Abdominal: Soft. Bowel sounds are normal. She exhibits no distension and no mass. There is no tenderness.  Musculoskeletal: Normal range of motion. She exhibits no edema and no tenderness.  Lymphadenopathy:    She has no cervical adenopathy.  Neurological: She is alert. Coordination normal.  Skin: Skin is warm and dry. No rash noted. No erythema.  Psychiatric: She has a normal mood and affect. Her behavior is normal.    ED Course  Procedures (including critical care time)   Labs Reviewed  CBC WITH DIFFERENTIAL  APTT  PROTIME-INR  POCT I-STAT, CHEM 8  POCT I-STAT TROPONIN I   Dg Chest Port 1 View  10/05/2012  *RADIOLOGY REPORT*  Clinical Data: Right-sided chest pain.  PORTABLE CHEST - 1 VIEW  Comparison: 09/15/2012  Findings: Single view of the chest demonstrates clear lungs. Stable appearance of the heart and mediastinum.  Trachea is midline.  IMPRESSION: No acute findings.   Original Report Authenticated By: Richarda Overlie, M.D.      1. Chest pain       MDM  At this time the patient appears stable with vital signs, pulse of 60, blood pressure 113/68, EKG shows  ED ECG REPORT  I personally interpreted this EKG   Date: 10/05/2012   Rate: 58  Rhythm: sinus bradycardia  QRS Axis: normal  Intervals: normal  ST/T Wave abnormalities: normal  Conduction Disutrbances:none  Narrative Interpretation:   Old EKG Reviewed: Compared with 09/15/2012, ectopy no longer seen.   Laboratory data pending, recent heart catheterization reassuring, would probably benefit from observation.  Laboratory data is reassuring, the patient appears stable for admission for a rule out myocardial infarction, discussed with family practice resident.     Vida Roller, MD 10/05/12 [phone]

## 2012-10-05 NOTE — Progress Notes (Signed)
[facility] called and reported patient had been wearing a heart monitor from 10/15-10/18. Report revealed that patient has had 54,000 ventricular beats recorded per [facility] office. Longest run recorded was 6 beats. Office to scan in report to Epic today. Dion Saucier

## 2012-10-05 NOTE — Progress Notes (Signed)
Pt had frequent runs of( 6-8 beats)  Vtach. Pt c/o of mild chest pain at the time of the irregular rythmn. Ekg obtain vs stable. Called md on call Dr. Casper Harrison. No new orders given.

## 2012-10-05 NOTE — H&P (Signed)
FMTS Attending Admit Note Patient seen and examined by me in ED, discussed with resident team and I agree with assess/plan as per resident note. Briefly, 56yoF who presents with c/o chest pain/epigastric pain that began last night while playing cards, not exertional.  She has received NTG and reports improvement in the pain, which is almost gone now.  No radiation; "baseline" shortness of breath which has not worsened.  No N/V/D, no sputum production, no fevers or chills.   Exam is unremarkable with exception of epigastric tenderness on palpation.  ECG reviewed in ED, NSR, question LAE.  Assess/Plan Patient with significant cardiac history followed by cardiologist Dr Johney Frame, presents for what appears to be atypical chest pain.  Initial POC cardiac enzyme negative.  Given presentation I am more inclined to suspect GI cause.  Will cycle enzymes and see that pain resolves.  Continue other home meds. Paula Compton, MD

## 2012-10-06 NOTE — Care Management Note (Unsigned)
    Page 1 of 1   10/06/2012     3:21:12 PM   CARE MANAGEMENT NOTE 10/06/2012  Patient:  [name], [name]   Account Number:  [id number]  Date Initiated:  10/06/2012  Documentation initiated by:  Kenlyn Lose  Subjective/Objective Assessment:   PT ADM WITH CHEST PAIN, PALPITATIONS.  PTA, PT INDEPENDENT, LIVES ALONE.     Action/Plan:   WILL FOLLOW FOR HOME NEEDS AS PT PROGRESSES.   Anticipated DC Date:  10/07/2012   Anticipated DC Plan:  HOME/SELF CARE      DC Planning Services  CM consult      Choice offered to / List presented to:             Status of service:  In process, will continue to follow Medicare Important Message given?   (If response is "NO", the following Medicare IM given date fields will be blank) Date Medicare IM given:   Date Additional Medicare IM given:    Discharge Disposition:    Per UR Regulation:  Reviewed for med. necessity/level of care/duration of stay  If discussed at Long Length of Stay Meetings, dates discussed:    Comments:

## 2012-10-06 NOTE — Progress Notes (Signed)
Patient: [name] Date of Encounter: 10/06/2012, 9:00 AM Admit date: 10/05/2012     Subjective  [name] reports she is feeling better this AM. Her palpitations are less frequent. She denies CP, SOB or dizziness.   Objective  Physical Exam: Vitals: BP 96/64  Pulse 54  Temp 98.4 F (36.9 C) (Oral)  Resp 18  Ht 5\' 8"  (1.727 m)  Wt 229 lb 11.5 oz (104.2 kg)  BMI 34.93 kg/m2  SpO2 97% General: Well developed, well appearing 56 year old female in no acute distress. Neck: Supple. JVD not elevated. Lungs: Clear bilaterally to auscultation without wheezes, rales, or rhonchi. Breathing is unlabored. Heart: RRR S1 S2 without murmurs, rubs, or gallops.  Abdomen: Soft, non-distended. Extremities: No clubbing or cyanosis. No edema.  Distal pedal pulses are 2+ and equal bilaterally. Neuro: Alert and oriented X 3. Moves all extremities spontaneously. No focal deficits.  Intake/Output:  Intake/Output Summary (Last 24 hours) at 10/06/12 0900 Last data filed at 10/06/12 0500  Gross per 24 hour  Intake    960 ml  Output      0 ml  Net    960 ml    Inpatient Medications:     . aspirin EC  81 mg Oral q morning - 10a  . atorvastatin  10 mg Oral Daily  . flecainide  50 mg Oral BID  . heparin  5,000 Units Subcutaneous Q8H  . influenza  inactive virus vaccine  0.5 mL Intramuscular Tomorrow-1000  . metoprolol succinate  50 mg Oral Daily  . pantoprazole  40 mg Oral Q1200  . PARoxetine  20 mg Oral Daily    Labs:  [facility] 10/05/12 0532 10/05/12 0307  NA 137 139  K 3.9 3.9  CL 104 101  CO2 27 --  GLUCOSE 93 88  BUN 19 20  CREATININE 0.84 1.00  CALCIUM 8.8 --  MG -- --  PHOS -- --    Basename 10/05/12 0532  AST 18  ALT 12  ALKPHOS 80  BILITOT 0.4  PROT 6.4  ALBUMIN 3.6   No results found for this basename: LIPASE:2,AMYLASE:2 in the last 72 hours  Basename 10/05/12 0532 10/05/12 0307 10/05/12 0251  WBC 7.2 -- 7.9  NEUTROABS -- -- 4.1  HGB 12.0 14.3 --    HCT 37.2 42.0 --  MCV 86.1 -- 86.1  PLT 210 -- 260    Basename 10/05/12 1651 10/05/12 1055 10/05/12 0527  CKTOTAL -- -- --  CKMB -- -- --  TROPONINI <0.30 <0.30 <0.30   No components found with this basename: POCBNP:3 No results found for this basename: DDIMER in the last 72 hours  Basename 10/05/12 0532  HGBA1C 5.9*    Basename 10/05/12 0533  CHOL 153  HDL 39*  LDLCALC 89  TRIG 161  CHOLHDL 3.9    Basename 10/05/12 0532  TSH 33.170*  T4TOTAL --  T3FREE --  THYROIDAB --    Radiology/Studies: Dg Chest Port 1 View  10/05/2012  *RADIOLOGY REPORT*  Clinical Data: Right-sided chest pain.  PORTABLE CHEST - 1 VIEW  Comparison: 09/15/2012  Findings: Single view of the chest demonstrates clear lungs. Stable appearance of the heart and mediastinum.  Trachea is midline.  IMPRESSION: No acute findings.   Original Report Authenticated By: Richarda Overlie, M.D.     Telemetry: sinus rhythm with frequent PVCs, few brief runs of NSVT, ~3-5 beats in duration   Assessment and Plan  1. NSVT, frequent PVCs - started on  flecainide on 09/17/2012; [name] feels her palpitations have improved in the last 24 hours and telemetry shows less NSVT overnight; continue BB; ? need for AAD dose adjustment - upon further questioning, [name] admits she is not taking flecainide. She states it is $63.00 and she cannot afford this medication. She has applied for disability and is waiting to hear regarding approval. Once approved, she will be able to get her medications. 2. Chest pain - now resolved; atypical for angina; recent negative Myoview with preserved LV function; no further cardiac w/u needed at this time; follow-up with Dr. Algie Coffer for known CAD 3. Elevated TSH - per primary team  Dr. Johney Frame to see and make further recommendations. Signed, EDMISTEN, BROOKE PA-C  I have seen, examined the patient, and reviewed the above assessment and plan.  Changes to above are made where necessary.  Pt with  symptomatic NSVT and PVCs.  Cath 2012 was normal.  No further CV resting is planned. She has not been taking her flecainide.  With flecainide here, PVCs have been much better. Unfortunately, I do not have any cheaper antiarrhythmic options.  She thinks that she can find the money for flecainide. We will continue to observe on flecaindie 50mg  BID tonight.  If rhythm is stable overnight, DC to home in am.  Co Sign: Hillis Range, MD 10/06/2012 5:47 PM

## 2012-10-06 NOTE — Progress Notes (Signed)
Patient name: [name] Medical record number: [id number]  Date of birth: 07/24/1956 Age: 56 y.o. Gender: female  Primary Care Provider: Delbert Harness, MD  Chief Complaint: right-sided/substernal chest pain    Assessment and Plan:  [name] is a 56 y.o. year old female with PMH significant for pt-reported MI (cath in October 2012 without definite CAD, normal EF; echo in January 2013 with normal EF, preserved LV function, and grade 1 diastolic dysfuntion), anxiety, COPD, and HLD presenting with right-sided intermittent chest pain for about 12 hours. In the ED, pt has had resolution of chest pain with residual epigastric/substernal soreness. Minimal reproducability of chest pain. POC Troponin negative, EKG shows borderline sinus bradycardia and no PVC's (which were seen on previous EKGs). CXR negative for acute abnormality. Otherwise, CBC, BMP, and PT/INR results are all WNL.    Chest pain/?history of MI -   Today 10/22 no pain.  Is ambulating without any symptoms.  Has rule out for MI  Plan - Ready for discharge from our POV.  Await Dr Tillie Rung recommendations.  Patient states she does not see Dr Algie Coffer any longer for cardiac care  Hypothyroidism TSH elevated. And has been for months. Results for [name], [name] (MRN [id number]) as of 10/06/2012 10:11  Ref. Range 12/18/2011 15:11 06/02/2012 16:08 06/13/2012 05:20 06/27/2012 23:30 10/05/2012 05:32  TSH Latest Range: 0.350-4.500 uIU/mL 31.381 (H) 18.749 (H) 24.278 (H) 26.094 (H) 33.170 (H)   Seems to be a compliance issue.  Patient states she was taking regularly and she had the bottle with her.  When went through her medications no thyroid medication was present and she then stated it was likely stopped. See office notes below for details.    Investigate if she has her replacement at home - bring in all medications today or next office visit.  Check T4  Ventricular Arrythmia  Currently no palpitations.  Is on flecanide.   Wonder if  uncontrolled hypothyroidism could be exacerbating.  Unsure if would need flecanide if thyroid was treated.  Await cardiology recommendations  COPD - No current difficulty breathing,   Hyperlipidemia - Currently taking Lipitor 10 daily. Continue.  Anxiety - No current symptoms. Continue home Paxil.      Hypothyroid Office APs  08/26/2012 Office Visit Signed 08/27/2012 9:47 AM by Macy Mis, MD    Patient has supply of medication at [facility]- asked her to pick it up. Will recheck at next visit.            06/02/2012 Office Visit Signed 06/04/2012 4:11 PM by Macy Mis, MD      tsh improved by still very high. Compliance has been an issue in the past but she states she religiously takes it daily. Will increase and repeat tsh in 6-8 weeks            03/11/2012 Office Visit Signed 03/11/2012 4:57 PM by Macy Mis, MD      Encouraged to fill this rpescription            12/27/2011 Office Visit Signed 12/27/2011 3:16 PM by Macy Mis, MD      Not taking meds, likely contributing to some symptoms. States she will get meds in 1-2 weeks. Will follow-up.            12/18/2011 Office Visit Signed 12/19/2011 3:01 PM by Macy Mis, MD      TSH again elevated. Patient with poor health literacy- not taking medications regularly. Will have  her return and bring all meds with her for patient education            10/15/2011 Office Visit Addendum 10/15/2011 2:12 PM by Macy Mis, MD      Patient's TSH was checked in the hospital on October 7 and was normal at 4.19. I have encouraged her to continue taking her Synthroid regularly                 Previous Version            09/03/2011 Office Visit Signed 09/03/2011 9:49 PM by Macy Mis, MD      See above. Has not filled levothyrozine. Advised on importance, will fill and follow-up in 6 weeks.            07/22/2011 Office Visit Signed 07/22/2011 2:39 PM by Macy Mis, MD      Still has not started synthroid. Urged her to pick it  up, at [facility] is on $4 list.            07/05/2011 Telephone Signed 07/11/2011 11:35 AM by Macy Mis, MD      Low normal TSH. Technically subclinical hypothyroidism but given she is symptomatic, very high TSh puts her at risk for progressing to full hypothyroidism. Will start synthroid today. Fu bloodwork in 6 weeks    S  Feels well no shortness of breath or chest pain or palpitations with ambulation O alert no acute distress BP 96/64  Pulse 54  Temp 98.4 F (36.9 C) (Oral)  Resp 18  Ht 5\' 8"  (1.727 m)  Wt 229 lb 11.5 oz (104.2 kg)  BMI 34.93 kg/m2  SpO2 97% Heart - Regular rate and rhythm.  No murmurs, gallops or rubs.    Lungs:  Normal respiratory effort, chest expands symmetrically. Lungs are clear to auscultation, no crackles or wheezes. Extremities:  No cyanosis, edema, or deformity noted with good range of motion of all major joints.

## 2012-10-07 MED ORDER — FLECAINIDE ACETATE 50 MG PO TABS: 50.0000 mg | ORAL_TABLET | Freq: Two times a day (BID) | ORAL | Status: DC

## 2012-10-07 MED ORDER — METOPROLOL SUCCINATE ER 50 MG PO TB24: 50.0000 mg | ORAL_TABLET | Freq: Every day | ORAL | Status: DC

## 2012-10-07 MED ORDER — LEVOTHYROXINE SODIUM 125 MCG PO TABS
125.0000 ug | ORAL_TABLET | Freq: Every day | ORAL | Status: DC
  Administered 2012-10-07: 125 ug via ORAL
  Filled 2012-10-07 (×2): qty 1

## 2012-10-07 MED ORDER — LEVOTHYROXINE SODIUM 125 MCG PO TABS: 125.0000 ug | ORAL_TABLET | Freq: Every day | ORAL | Status: DC

## 2012-10-07 NOTE — Progress Notes (Signed)
Pt for discharge home today, IV D/C, D/C instructions and Rx faxed with verbalized understanding.  Friends at bedside to assist pt with discharge. Staff brought pt downstairs via wheelchair.

## 2012-10-07 NOTE — Progress Notes (Signed)
I i discussed the care plan with Dr. Adriana Simas and the [facility] team and agree with assessment and plan as documented in the discharge note for today.    Chiffon Kittleson A. Sheffield Slider, MD Family Medicine Teaching Service Attending  10/07/2012 9:26 PM

## 2012-10-07 NOTE — Progress Notes (Signed)
SUBJECTIVE: The patient is doing well today.  At this time, she denies chest pain, shortness of breath, or any new concerns.  She wants to go home     . aspirin EC  81 mg Oral q morning - 10a  . atorvastatin  10 mg Oral Daily  . flecainide  50 mg Oral BID  . heparin  5,000 Units Subcutaneous Q8H  . influenza  inactive virus vaccine  0.5 mL Intramuscular Tomorrow-1000  . metoprolol succinate  50 mg Oral Daily  . pantoprazole  40 mg Oral Q1200  . PARoxetine  20 mg Oral Daily      OBJECTIVE: Physical Exam: Filed Vitals:   10/06/12 0434 10/06/12 1356 10/06/12 2042 10/07/12 0609  BP: 96/64 106/58 107/71 117/66  Pulse: 54 57 58 61  Temp: 98.4 F (36.9 C) 97.8 F (36.6 C) 97.9 F (36.6 C) 97.7 F (36.5 C)  TempSrc: Oral Oral Oral Oral  Resp: 18 18 16 16   Height:      Weight:      SpO2: 97% 100% 98% 98%    Intake/Output Summary (Last 24 hours) at 10/07/12 0729 Last data filed at 10/06/12 1300  Gross per 24 hour  Intake    480 ml  Output      0 ml  Net    480 ml    Telemetry reveals sinus rhythm  GEN- The patient is well appearing, alert and oriented x 3 today.   Head- normocephalic, atraumatic Eyes-  Sclera clear, conjunctiva pink Ears- hearing intact Oropharynx- clear Neck- supple, no JVP Lymph- no cervical lymphadenopathy Lungs- Clear to ausculation bilaterally, normal work of breathing Heart- Regular rate and rhythm, no murmurs, rubs or gallops, PMI not laterally displaced GI- soft, NT, ND, + BS Extremities- no clubbing, cyanosis, or edema   LABS: Basic Metabolic Panel:  Basename 10/05/12 0532 10/05/12 0307  NA 137 139  K 3.9 3.9  CL 104 101  CO2 27 --  GLUCOSE 93 88  BUN 19 20  CREATININE 0.84 1.00  CALCIUM 8.8 --  MG -- --  PHOS -- --   Liver Function Tests:  Basename 10/05/12 0532  AST 18  ALT 12  ALKPHOS 80  BILITOT 0.4  PROT 6.4  ALBUMIN 3.6   No results found for this basename: LIPASE:2,AMYLASE:2 in the last 72  hours CBC:  Basename 10/05/12 0532 10/05/12 0307 10/05/12 0251  WBC 7.2 -- 7.9  NEUTROABS -- -- 4.1  HGB 12.0 14.3 --  HCT 37.2 42.0 --  MCV 86.1 -- 86.1  PLT 210 -- 260   Cardiac Enzymes:  Basename 10/05/12 1651 10/05/12 1055 10/05/12 0527  CKTOTAL -- -- --  CKMB -- -- --  CKMBINDEX -- -- --  TROPONINI <0.30 <0.30 <0.30   BNP: No components found with this basename: POCBNP:3 D-Dimer: No results found for this basename: DDIMER:2 in the last 72 hours Hemoglobin A1C:  Basename 10/05/12 0532  HGBA1C 5.9*   Fasting Lipid Panel:  Basename 10/05/12 0533  CHOL 153  HDL 39*  LDLCALC 89  TRIG 409  CHOLHDL 3.9  LDLDIRECT --   Thyroid Function Tests:  Basename 10/05/12 0532  TSH 33.170*  T4TOTAL --  T3FREE --  THYROIDAB --   Anemia Panel: No results found for this basename: VITAMINB12,FOLATE,FERRITIN,TIBC,IRON,RETICCTPCT in the last 72 hours  RADIOLOGY: Dg Chest Port 1 View  10/05/2012  *RADIOLOGY REPORT*  Clinical Data: Right-sided chest pain.  PORTABLE CHEST - 1 VIEW  Comparison: 09/15/2012  Findings: Single  view of the chest demonstrates clear lungs. Stable appearance of the heart and mediastinum.  Trachea is midline.  IMPRESSION: No acute findings.   Original Report Authenticated By: Richarda Overlie, M.D.    Dg Chest Portable 1 View  09/15/2012  *RADIOLOGY REPORT*  Clinical Data: Chest pain left shoulder pain  PORTABLE CHEST - 1 VIEW  Comparison: 07/20/2012  Findings: Cardiomediastinal silhouette is stable.  No pulmonary edema.  Mild elevation of the right hemidiaphragm. Left basilar small atelectasis or early infiltrate.  IMPRESSION: No pulmonary edema.  Left basilar small atelectasis or early infiltrate.   Original Report Authenticated By: Natasha Mead, M.D.     ASSESSMENT AND PLAN:  Active Problems:  Palpitations  Substernal chest pain  1. Atypical chest pain- resolved Cath 2012 revealed no cad No further workup  2. NSVT/ PVCs Much improved with flecainide She  is willing to try to pay for flecainide Case management may be  Able to help with this.  OK to discharge on flecainide 50mg  BID.  Would continue metoprolol also I will see as needed while here. Follow-up with me in the office in 4 weeks.  Thanks  Johnell Comings, MD 10/07/2012 7:29 AM

## 2012-10-07 NOTE — Discharge Summary (Signed)
Family Medicine Teaching [facility] Discharge Summary  Patient name: [name] Medical record number: [id number] Date of birth: 07/29/1956 Age: 56 y.o. Gender: female Date of Admission: 10/05/2012  Date of Discharge: 10/07/12 Admitting Physician: Barbaraann Barthel, MD  Primary Care Provider: Delbert Harness, MD  Indication for Hospitalization: Chest pain Discharge Diagnoses:  Chest pain, atypical PVC's and Non-sustained Ventricular Tachycardia Hypothyroidism COPD Hyperlipidemia Anxiety  Brief Hospital Course:  56 y.o. year old female with PMH significant for CAD, anxiety, COPD, and HLD who presented with right-sided intermittent chest pain for about 12 hours. In the ED, pt has had resolution of chest pain with residual epigastric/substernal soreness.   1) Chest pain, atypical Given history of CAD and presentation, there was concern for ACS.  EKG revealed sinus bradycardia with no ST or T wave changes.  Troponin's were cycled x 3 and were negative.  Patient was treated with ASA, Lipitor, and metoprolol.  Cardiology was consulted during admission and no further intervention was recommended/done.  2) PVC's and Non-sustained Ventricular Tachycardia Patient has a known history of frequent PVC's and NSVT and is followed by Dr. Johney Frame.  Patient was recently started on Flecainide.  Dr. Johney Frame saw patient during admission and recommended that the patient continue Flecainide as PVC's and NSVT markedly improved with treatment.  Patient was discharged on Flecainide 50 mg BID.  3) Hypothyroidism Patient was not on Synthroid on admission.  TSH obtained as markedly elevated at 33.170 and Free T4 of 0.72.  Patient was started on Synthroid 125 mcg during hospitalization.  Patient will need continued followup and repeat TSH in 6 weeks.  4) COPD Stable during admission and did not require treatment.  5) Hyperlipidemia Patient was continued on Lipitor 10 mg.  Lipid panel revealed LDL of 89.    6) Anxiety  Patient was continued on home Paxil Significant Labs and Imaging:   CBC BMET   Lab 10/05/12 0532 10/05/12 0307 10/05/12 0251  WBC 7.2 -- 7.9  HGB 12.0 14.3 13.2  HCT 37.2 42.0 40.3  PLT 210 -- 260    Lab 10/05/12 0532 10/05/12 0307  NA 137 139  K 3.9 3.9  CL 104 101  CO2 27 --  BUN 19 20  CREATININE 0.84 1.00  GLUCOSE 93 88  CALCIUM 8.8 --     Lipid Panel     Component Value Date/Time   CHOL 153 10/05/2012 0533   TRIG 126 10/05/2012 0533   HDL 39* 10/05/2012 0533   CHOLHDL 3.9 10/05/2012 0533   VLDL 25 10/05/2012 0533   LDLCALC 89 10/05/2012 0533    Lab Results  Component Value Date   TSH 33.170* 10/05/2012   Free T4 - 0.72  Lab Results  Component Value Date   HGBA1C 5.9* 10/05/2012    Cardiac Panel (last 3 results)  Basename 10/05/12 1651 10/05/12 1055 10/05/12 0527  CKTOTAL -- -- --  CKMB -- -- --  TROPONINI <0.30 <0.30 <0.30  RELINDX -- -- --   Dg Chest Port 1 View 10/05/2012 IMPRESSION: No acute findings.     Procedures: None  Consultations: Cardiology Dr. Excell Seltzer, and Dr. Johney Frame (EP specialist)  Discharge Medications:    Medication List     As of 10/07/2012  9:35 PM    TAKE these medications         albuterol 108 (90 BASE) MCG/ACT inhaler   Commonly known as: PROVENTIL HFA;VENTOLIN HFA   Inhale 2 puffs into the lungs every 2 (two) hours as needed  for wheezing or shortness of breath (cough).      aspirin EC 81 MG tablet   Take 81 mg by mouth every morning.      atorvastatin 10 MG tablet   Commonly known as: LIPITOR   Take 10 mg by mouth daily.      diphenhydramine-acetaminophen 25-500 MG Tabs   Commonly known as: TYLENOL PM   Take 1 tablet by mouth at bedtime as needed. For pain & sleep      flecainide 50 MG tablet   Commonly known as: TAMBOCOR   Take 1 tablet (50 mg total) by mouth 2 (two) times daily.      levothyroxine 125 MCG tablet   Commonly known as: SYNTHROID, LEVOTHROID   Take 1 tablet (125 mcg total)  by mouth daily before breakfast.      metoprolol succinate 50 MG 24 hr tablet   Commonly known as: TOPROL-XL   Take 1 tablet (50 mg total) by mouth daily. Take with or immediately following a meal.      oxyCODONE-acetaminophen 5-325 MG per tablet   Commonly known as: PERCOCET/ROXICET   1 to 2 tabs PO q6hrs  PRN for pain      PARoxetine 20 MG tablet   Commonly known as: PAXIL   Take 20 mg by mouth every morning.       Issues for Follow Up:  1) Continued compliance with Flecainide and Synthroid 2) Patient will need follow up TSH in 6 weeks 3) Consider increase in Lipitor to reach goal LDL of less than 70  Outstanding Results: None  Discharge Instructions: Patient was counseled important signs and symptoms that should prompt return to medical care, changes in medications, dietary instructions, activity restrictions, and follow up appointments.       Follow-up Information    Follow up with Delbert Harness, MD. On 10/16/2012. (9:30 am)    Contact information:   [address] [place] [address] [phone]       Call Hillis Range, MD. (Schedule appointment for 1 month from discharge)    Contact information:   [address], [address] [place] [address] [phone]          Discharge Condition: Stable. Discharged home.  Everlene Other, DO 10/07/2012, 9:35 PM

## 2012-10-07 NOTE — Progress Notes (Signed)
Family Medicine Teaching Service Daily Progress Note Service Page: [phone]    Subjective:  No complaints this am. Denies chest pain, palpitations, SOB. Eager to go home.  Objective: Temp:  [97.7 F (36.5 C)-97.9 F (36.6 C)] 97.7 F (36.5 C) (10/23 0609) Pulse Rate:  [57-61] 61  (10/23 0609) Resp:  [16-18] 16  (10/23 0609) BP: (106-117)/(58-71) 117/66 mmHg (10/23 0609) SpO2:  [98 %-100 %] 98 % (10/23 6213)  Exam: General: awake, alert.  NAD. Cardiovascular: Irregular.  No murmurs, rubs, or gallops auscultated. Respiratory: CTAB. No rales, rhonchi, or wheeze. Abdomen: obese, soft, nontender, nondistended. Extremities: trace edema noted.  CBC BMET   Lab 10/05/12 0532 10/05/12 0307 10/05/12 0251  WBC 7.2 -- 7.9  HGB 12.0 14.3 13.2  HCT 37.2 42.0 40.3  PLT 210 -- 260    Lab 10/05/12 0532 10/05/12 0307  NA 137 139  K 3.9 3.9  CL 104 101  CO2 27 --  BUN 19 20  CREATININE 0.84 1.00  GLUCOSE 93 88  CALCIUM 8.8 --     Imaging/Diagnostic Tests:  Cardiac Panel (last 3 results)  Basename 10/05/12 1651 10/05/12 1055 10/05/12 0527  CKTOTAL -- -- --  CKMB -- -- --  TROPONINI <0.30 <0.30 <0.30  RELINDX -- -- --   Lab Results  Component Value Date   TSH 33.170* 10/05/2012   Lipid Panel     Component Value Date/Time   CHOL 153 10/05/2012 0533   TRIG 126 10/05/2012 0533   HDL 39* 10/05/2012 0533   CHOLHDL 3.9 10/05/2012 0533   VLDL 25 10/05/2012 0533   LDLCALC 89 10/05/2012 0533   Free T4 - 0.72  EKG: Sinus Bradycardia  Assessment/Plan: [name] is a 56 y.o. year old female with PMH significant for pt-reported MI (cath in October 2012 without definite CAD, normal EF; echo in January 2013 with normal EF, preserved LV function, and grade 1 diastolic dysfuntion), anxiety, COPD, and HLD presenting with right-sided intermittent chest pain.  # Chest pain - Rule out ACS - Patient chest pain has resolved - Troponin negative x 3 - Will continue ASA,  Statin, Beta blocker and flecainide  # Palpitations - Symtomatic PVC's/NSVT - Patient followed by Dr. Johney Frame, EP specialist - Patient was started on Flecainide on 10/3 - Per Dr. Johney Frame, patient will continue Flecainide 50 mg BID as patient has had good response.  If rhythm is stable, patient can be discharged today.  # Hypothyroidism - TSH elevated at 31.170 and free T4 decreased at 0.720.   - Will start Synthroid 125 mcg today.  Patient will need continued outpatient followup.  # COPD - Stable - Will continue PRN Albuterol  # Hyperlipidemia - Continue Lipitor  # Anxiety - Continue home Paxil.  FEN/GI: Heart healthy diet, saline lock IV  Prophylaxis: SubQ heparin, Protonix  Disposition: D/C home today after Code Status: Full code  Everlene Other, DO 10/07/2012, 6:57 AM

## 2012-10-08 ENCOUNTER — Telehealth: Payer: Self-pay | Admitting: *Deleted

## 2012-10-08 NOTE — Discharge Summary (Signed)
I discussed the care plan with Dr. Adriana Simas and the [facility] team and agree with assessment and plan as documented in the discharge note for today.   Doroteo Nickolson A. Sheffield Slider, MD Family Medicine Teaching Service Attending  10/08/2012 4:17 AM

## 2012-10-08 NOTE — Telephone Encounter (Signed)
Called patient and she says she is doing better since her discharge from the hospital. Also reminded her of her upcoming appointment with Dr Berline Chough on 10/16/2012 here at [facility] and she states that she intends on keeping that appointment.Busick, [name]

## 2012-10-09 ENCOUNTER — Ambulatory Visit: Payer: Medicaid Other | Admitting: Internal Medicine

## 2012-10-16 ENCOUNTER — Ambulatory Visit (INDEPENDENT_AMBULATORY_CARE_PROVIDER_SITE_OTHER): Payer: Medicaid Other | Admitting: Family Medicine

## 2012-10-16 VITALS — BP 113/79 | HR 59 | Temp 98.3°F | Ht 68.0 in | Wt 229.3 lb

## 2012-10-16 DIAGNOSIS — R079 Chest pain, unspecified: Secondary | ICD-10-CM

## 2012-10-16 DIAGNOSIS — R002 Palpitations: Secondary | ICD-10-CM

## 2012-10-16 DIAGNOSIS — E039 Hypothyroidism, unspecified: Secondary | ICD-10-CM

## 2012-10-16 NOTE — Patient Instructions (Signed)
Great to meet you today. I am glad you are doing well since getting out of the hospital. I am glad the medicines are working for you to help you feel better. I want you to come back in 4-5 weeks to see Dr. Earnest Bailey and get your thyroid level checked.   Health Maintenance Due  Topic Date Due  . Colonoscopy  02/02/2007  Please remember to call the # we gave you to get your colonoscopy.

## 2012-10-17 NOTE — Assessment & Plan Note (Signed)
Compliant with meds reportedly. Needs TSH in 4-5 weeks. Patient to follow up with PCP.

## 2012-10-17 NOTE — Assessment & Plan Note (Signed)
Improved on Flecainide. Patient has cardiology follow up within the month reportedly.

## 2012-10-17 NOTE — Assessment & Plan Note (Addendum)
Resolved since first night of hospitalization. Cath/myoview not suggested given normal myoview in June 2013.

## 2012-10-17 NOTE — Progress Notes (Signed)
Subjective:  Hospital follow up for chest pain, palpitations  1. Chest pain, palpitations follow up-has not recurred and has had no abdominal pain. She is still feeling occasional palpitations but not nearly as bothersome or frequent. She has filled her Flecainide and is compliant without side effects.   2. Hypothyroidism-complaint with levothyroxine without side effects.   ROS--See HPI  Past Medical History-history of MI? questionable, Hyperlipidemia, cocpD, anxiety Reviewed problem list.  Medications- reviewed and updated Chief complaint-noted  Objective: BP 113/79  Pulse 59  Temp 98.3 F (36.8 C) (Oral)  Ht 5\' 8"  (1.727 m)  Wt 229 lb 4.8 oz (104.01 kg)  BMI 34.87 kg/m2 Gen: NAD CV: RRR no mrg Lungs: CTAB Ext: trace edema  Assessment/Plan: See problem oriented charted  Encouraged colonoscopy and given handout Question during hospitalization if should set LDL goal at <70 given possible history of MI. Will defer to PCP/cardiology

## 2012-10-22 ENCOUNTER — Ambulatory Visit: Payer: Self-pay | Admitting: Internal Medicine

## 2012-11-03 ENCOUNTER — Encounter: Payer: Self-pay | Admitting: Internal Medicine

## 2012-11-05 ENCOUNTER — Ambulatory Visit: Payer: Medicaid Other | Admitting: Internal Medicine

## 2012-11-09 ENCOUNTER — Emergency Department (HOSPITAL_COMMUNITY)
Admission: EM | Admit: 2012-11-09 | Discharge: 2012-11-09 | Disposition: A | Discharge status: Home / Self Care | Payer: Medicaid Other | Attending: Emergency Medicine | Admitting: Emergency Medicine

## 2012-11-09 ENCOUNTER — Telehealth: Payer: Self-pay | Admitting: Nurse Practitioner

## 2012-11-09 ENCOUNTER — Encounter (HOSPITAL_COMMUNITY): Payer: Self-pay | Admitting: *Deleted

## 2012-11-09 ENCOUNTER — Emergency Department (HOSPITAL_COMMUNITY): Payer: Medicaid Other

## 2012-11-09 DIAGNOSIS — M2550 Pain in unspecified joint: Secondary | ICD-10-CM | POA: Insufficient documentation

## 2012-11-09 DIAGNOSIS — Z87891 Personal history of nicotine dependence: Secondary | ICD-10-CM | POA: Insufficient documentation

## 2012-11-09 DIAGNOSIS — I252 Old myocardial infarction: Secondary | ICD-10-CM | POA: Insufficient documentation

## 2012-11-09 DIAGNOSIS — W010XXA Fall on same level from slipping, tripping and stumbling without subsequent striking against object, initial encounter: Secondary | ICD-10-CM | POA: Insufficient documentation

## 2012-11-09 DIAGNOSIS — J449 Chronic obstructive pulmonary disease, unspecified: Secondary | ICD-10-CM | POA: Insufficient documentation

## 2012-11-09 DIAGNOSIS — M25562 Pain in left knee: Secondary | ICD-10-CM

## 2012-11-09 DIAGNOSIS — I10 Essential (primary) hypertension: Secondary | ICD-10-CM | POA: Insufficient documentation

## 2012-11-09 DIAGNOSIS — Z8659 Personal history of other mental and behavioral disorders: Secondary | ICD-10-CM | POA: Insufficient documentation

## 2012-11-09 DIAGNOSIS — J4489 Other specified chronic obstructive pulmonary disease: Secondary | ICD-10-CM | POA: Insufficient documentation

## 2012-11-09 DIAGNOSIS — Z8679 Personal history of other diseases of the circulatory system: Secondary | ICD-10-CM | POA: Insufficient documentation

## 2012-11-09 DIAGNOSIS — S8990XA Unspecified injury of unspecified lower leg, initial encounter: Secondary | ICD-10-CM | POA: Insufficient documentation

## 2012-11-09 DIAGNOSIS — I2510 Atherosclerotic heart disease of native coronary artery without angina pectoris: Secondary | ICD-10-CM | POA: Insufficient documentation

## 2012-11-09 DIAGNOSIS — Z8739 Personal history of other diseases of the musculoskeletal system and connective tissue: Secondary | ICD-10-CM | POA: Insufficient documentation

## 2012-11-09 DIAGNOSIS — Z8709 Personal history of other diseases of the respiratory system: Secondary | ICD-10-CM | POA: Insufficient documentation

## 2012-11-09 DIAGNOSIS — Z79899 Other long term (current) drug therapy: Secondary | ICD-10-CM | POA: Insufficient documentation

## 2012-11-09 DIAGNOSIS — Y9289 Other specified places as the place of occurrence of the external cause: Secondary | ICD-10-CM | POA: Insufficient documentation

## 2012-11-09 DIAGNOSIS — Y939 Activity, unspecified: Secondary | ICD-10-CM | POA: Insufficient documentation

## 2012-11-09 DIAGNOSIS — E669 Obesity, unspecified: Secondary | ICD-10-CM | POA: Insufficient documentation

## 2012-11-09 DIAGNOSIS — S99929A Unspecified injury of unspecified foot, initial encounter: Secondary | ICD-10-CM | POA: Insufficient documentation

## 2012-11-09 HISTORY — DX: Chronic obstructive pulmonary disease, unspecified: J44.9

## 2012-11-09 MED ORDER — OXYCODONE-ACETAMINOPHEN 5-325 MG PO TABS: 1.0000 | ORAL_TABLET | Freq: Four times a day (QID) | ORAL | Status: DC | PRN

## 2012-11-09 MED ORDER — OXYCODONE-ACETAMINOPHEN 5-325 MG PO TABS
2.0000 | ORAL_TABLET | Freq: Once | ORAL | Status: AC
  Administered 2012-11-09: 2 via ORAL
  Filled 2012-11-09: qty 2

## 2012-11-09 NOTE — ED Notes (Signed)
Per EMS pt called them for L knee pain, fell 2 days ago on knee, pain worse when walks, 10/10 when walking, 8/10 when sitting. 98% RA, RR 12, BP 150/88, HR 56.

## 2012-11-09 NOTE — ED Notes (Signed)
ZOX:[id number]<AV> Expected date:11/09/12<BR> Expected time: 4:09 PM<BR> Means of arrival:Ambulance<BR> Comments:<BR> Knee pain

## 2012-11-09 NOTE — ED Provider Notes (Signed)
Medical screening examination/treatment/procedure(s) were performed by non-physician practitioner and as supervising physician I was immediately available for consultation/collaboration.  Doug Sou, MD 11/09/12 [phone]

## 2012-11-09 NOTE — Telephone Encounter (Signed)
[name] called on the evening of 11/22 to report that she was feeling short of breath over the course of the day.  She planned to present to the ED for evaluation and I advised that that would be a wise decision.

## 2012-11-09 NOTE — ED Notes (Signed)
Pt fell on left knee 3 days ago. Pain is worse. Pt states it looks swollen and it pops. Pt has not taken any medication for pain.

## 2012-11-09 NOTE — ED Provider Notes (Signed)
History     CSN: [id number]  Arrival date & time 11/09/12  1625   First MD Initiated Contact with Patient 11/09/12 1641      Chief Complaint  Patient presents with  . Knee Pain    (Consider location/radiation/quality/duration/timing/severity/associated sxs/prior treatment) HPI Comments: Patient presents with complaint of left knee pain s/p "fall down the hill" three days ago. She rates the pain 10/10, and localized to the patella. Patient states that she slipped due to recent rain. Denies head trauma or LOC. Denies numbness or tingling. Denies joint swelling. Denies difficulty ambulating.  The history is provided by the patient. No language interpreter was used.    Past Medical History  Diagnosis Date  . Myocardial infarction 1997  . Coronary artery disease   . Hypertension   . Arthritis   . Problem with literacy   . Shortness of breath   . Anxiety   . Bronchitis   . Chronic headaches   . Obesity   . COPD (chronic obstructive pulmonary disease)     Past Surgical History  Procedure Date  . Ectopic pregnancy surgery     right    Family History  Problem Relation Age of Onset  . Cancer Mother 51    lung ca  . Diabetes Mother   . Anxiety disorder Sister   . Cancer Brother 60    lung    History  Substance Use Topics  . Smoking status: Former Smoker -- 1.0 packs/day for 3 years    Quit date: 12/03/2010  . Smokeless tobacco: Not on file  . Alcohol Use: No    OB History    Grav Para Term Preterm Abortions TAB SAB Ect Mult Living                  Review of Systems  Constitutional: Negative for fever and chills.  Musculoskeletal: Positive for arthralgias. Negative for joint swelling and gait problem.  Neurological: Negative for numbness.    Allergies  Penicillins  Home Medications   Current Outpatient Rx  Name  Route  Sig  Dispense  Refill  . ALBUTEROL SULFATE HFA 108 (90 BASE) MCG/ACT IN AERS   Inhalation   Inhale 2 puffs into the lungs every 2  (two) hours as needed for wheezing or shortness of breath (cough).   1 Inhaler   5   . ATORVASTATIN CALCIUM 10 MG PO TABS   Oral   Take 10 mg by mouth daily.         Marland Kitchen DIPHENHYDRAMINE-APAP (SLEEP) 25-500 MG PO TABS   Oral   Take 1 tablet by mouth at bedtime as needed. For pain & sleep         . LEVOTHYROXINE SODIUM 125 MCG PO TABS   Oral   Take 1 tablet (125 mcg total) by mouth daily before breakfast.   30 tablet   0   . METOPROLOL SUCCINATE ER 50 MG PO TB24   Oral   Take 50 mg by mouth daily. Take with or immediately following a meal.         . PAROXETINE HCL 20 MG PO TABS   Oral   Take 20 mg by mouth every morning.           There were no vitals taken for this visit.  Physical Exam  Nursing note and vitals reviewed. Constitutional: She appears well-developed and well-nourished.  HENT:  Head: Normocephalic and atraumatic.  Mouth/Throat: Oropharynx is clear and moist.  Eyes: Conjunctivae  normal and EOM are normal. No scleral icterus.  Neck: Neck supple.  Cardiovascular: Normal rate, regular rhythm and normal heart sounds.   Pulmonary/Chest: Effort normal and breath sounds normal.  Abdominal: Soft. Bowel sounds are normal. There is no tenderness.  Musculoskeletal: Normal range of motion. She exhibits tenderness. She exhibits no edema.       Tenderness to palpation of the patella. Negative ballotment test. No laxity with valgus or varus stress. Pain with flexion.  Neurological: She is alert.  Skin: Skin is warm and dry.    ED Course  Procedures (including critical care time)  Labs Reviewed - No data to display Dg Knee Complete 4 Views Left  11/09/2012  *RADIOLOGY REPORT*  Clinical Data: There is history of recent injury.  Pain in the anterior aspect of the left knee.  LEFT KNEE - COMPLETE 4+ VIEW  Comparison: 06/23/2012.  Findings: No definite joint effusion is seen.  No fracture or dislocation is evident.  There is very minimal spurring of the posterior  inferior aspect of the patella unchanged.  IMPRESSION: No fracture or dislocation is evident.   Original Report Authenticated By: Onalee Hua Call      1. Left knee pain       MDM  Patient presented with complaint of left knee pain s/p fall.. Given pain medication in ED with improvement. Imaging of knee unremarkable. Discharged with knee wrap, instruction on rest, ice, elevation, and a short course of pain medication. Return precautions given verbally and in discharge summary. No red flags for septic join, fracture, or subluxation.       Pixie Casino, PA-C 11/09/12 1914

## 2012-11-16 ENCOUNTER — Ambulatory Visit: Payer: Medicaid Other | Admitting: Family Medicine

## 2012-11-23 ENCOUNTER — Ambulatory Visit: Payer: Medicaid Other | Admitting: Family Medicine

## 2012-11-27 ENCOUNTER — Telehealth: Payer: Self-pay | Admitting: Family Medicine

## 2012-11-27 NOTE — Telephone Encounter (Signed)
Patient states for 2-3 days has been hurting in low back , left hip, low abdomen up to ribs. Advised  we do not have available appointment today but  She can go to Urgent Care if she feels she needs to be seen today. She voices understanding.

## 2012-11-27 NOTE — Telephone Encounter (Signed)
Pt is hurting all over and wants to know if she needs to be seen.

## 2012-11-30 ENCOUNTER — Emergency Department (HOSPITAL_COMMUNITY): Payer: Medicaid Other

## 2012-11-30 ENCOUNTER — Inpatient Hospital Stay (HOSPITAL_COMMUNITY)
Admission: EM | Admit: 2012-11-30 | Discharge: 2012-12-02 | DRG: 293 | Disposition: A | Discharge status: Home / Self Care | Payer: Medicaid Other | Attending: Cardiovascular Disease | Admitting: Cardiovascular Disease

## 2012-11-30 ENCOUNTER — Ambulatory Visit: Payer: Self-pay | Admitting: Internal Medicine

## 2012-11-30 ENCOUNTER — Encounter (HOSPITAL_COMMUNITY): Payer: Self-pay | Admitting: *Deleted

## 2012-11-30 DIAGNOSIS — Z87891 Personal history of nicotine dependence: Secondary | ICD-10-CM

## 2012-11-30 DIAGNOSIS — R002 Palpitations: Secondary | ICD-10-CM | POA: Diagnosis present

## 2012-11-30 DIAGNOSIS — I10 Essential (primary) hypertension: Secondary | ICD-10-CM | POA: Diagnosis present

## 2012-11-30 DIAGNOSIS — I5031 Acute diastolic (congestive) heart failure: Principal | ICD-10-CM | POA: Diagnosis present

## 2012-11-30 DIAGNOSIS — I509 Heart failure, unspecified: Secondary | ICD-10-CM

## 2012-11-30 DIAGNOSIS — J4489 Other specified chronic obstructive pulmonary disease: Secondary | ICD-10-CM | POA: Diagnosis present

## 2012-11-30 DIAGNOSIS — R51 Headache: Secondary | ICD-10-CM | POA: Diagnosis present

## 2012-11-30 DIAGNOSIS — J449 Chronic obstructive pulmonary disease, unspecified: Secondary | ICD-10-CM | POA: Diagnosis present

## 2012-11-30 DIAGNOSIS — Z6833 Body mass index (BMI) 33.0-33.9, adult: Secondary | ICD-10-CM

## 2012-11-30 DIAGNOSIS — Z880 Allergy status to penicillin: Secondary | ICD-10-CM

## 2012-11-30 DIAGNOSIS — I4949 Other premature depolarization: Secondary | ICD-10-CM | POA: Diagnosis present

## 2012-11-30 DIAGNOSIS — M129 Arthropathy, unspecified: Secondary | ICD-10-CM | POA: Diagnosis present

## 2012-11-30 DIAGNOSIS — I493 Ventricular premature depolarization: Secondary | ICD-10-CM | POA: Diagnosis present

## 2012-11-30 DIAGNOSIS — I2510 Atherosclerotic heart disease of native coronary artery without angina pectoris: Secondary | ICD-10-CM | POA: Diagnosis present

## 2012-11-30 DIAGNOSIS — I252 Old myocardial infarction: Secondary | ICD-10-CM

## 2012-11-30 DIAGNOSIS — Z79899 Other long term (current) drug therapy: Secondary | ICD-10-CM

## 2012-11-30 DIAGNOSIS — F411 Generalized anxiety disorder: Secondary | ICD-10-CM | POA: Diagnosis present

## 2012-11-30 DIAGNOSIS — E669 Obesity, unspecified: Secondary | ICD-10-CM | POA: Diagnosis present

## 2012-11-30 DIAGNOSIS — I5022 Chronic systolic (congestive) heart failure: Secondary | ICD-10-CM

## 2012-11-30 DIAGNOSIS — F419 Anxiety disorder, unspecified: Secondary | ICD-10-CM | POA: Diagnosis present

## 2012-11-30 DIAGNOSIS — E039 Hypothyroidism, unspecified: Secondary | ICD-10-CM | POA: Diagnosis present

## 2012-11-30 DIAGNOSIS — E785 Hyperlipidemia, unspecified: Secondary | ICD-10-CM | POA: Diagnosis present

## 2012-11-30 HISTORY — DX: Chest pain, unspecified: R07.9

## 2012-11-30 HISTORY — DX: Chronic diastolic (congestive) heart failure: I50.32

## 2012-11-30 HISTORY — DX: Gastro-esophageal reflux disease without esophagitis: K21.9

## 2012-11-30 LAB — BASIC METABOLIC PANEL
CO2: 26 mEq/L (ref 19–32)
Calcium: 9.2 mg/dL (ref 8.4–10.5)
Chloride: 104 mEq/L (ref 96–112)
Glucose, Bld: 102 mg/dL — ABNORMAL HIGH (ref 70–99)
Potassium: 3.9 mEq/L (ref 3.5–5.1)
Sodium: 141 mEq/L (ref 135–145)

## 2012-11-30 LAB — CBC
HCT: 38.9 % (ref 36.0–46.0)
Hemoglobin: 12.8 g/dL (ref 12.0–15.0)
MCH: 28.1 pg (ref 26.0–34.0)
MCHC: 32.9 g/dL (ref 30.0–36.0)

## 2012-11-30 LAB — CBC WITH DIFFERENTIAL/PLATELET
Eosinophils Absolute: 0.2 10*3/uL (ref 0.0–0.7)
Eosinophils Relative: 3 % (ref 0–5)
HCT: 36.6 % (ref 36.0–46.0)
Hemoglobin: 11.8 g/dL — ABNORMAL LOW (ref 12.0–15.0)
Lymphs Abs: 2.2 10*3/uL (ref 0.7–4.0)
MCH: 27.6 pg (ref 26.0–34.0)
MCV: 85.7 fL (ref 78.0–100.0)
Monocytes Absolute: 0.5 10*3/uL (ref 0.1–1.0)
Monocytes Relative: 7 % (ref 3–12)
RBC: 4.27 MIL/uL (ref 3.87–5.11)

## 2012-11-30 MED ORDER — LEVOTHYROXINE SODIUM 137 MCG PO TABS
137.0000 ug | ORAL_TABLET | Freq: Every day | ORAL | Status: DC
  Administered 2012-12-01 – 2012-12-02 (×2): 137 ug via ORAL
  Filled 2012-11-30 (×3): qty 1

## 2012-11-30 MED ORDER — ATORVASTATIN CALCIUM 10 MG PO TABS
10.0000 mg | ORAL_TABLET | Freq: Every day | ORAL | Status: DC
  Administered 2012-12-01: 10 mg via ORAL
  Filled 2012-11-30 (×2): qty 1

## 2012-11-30 MED ORDER — ALBUTEROL SULFATE HFA 108 (90 BASE) MCG/ACT IN AERS: 2.0000 | INHALATION_SPRAY | RESPIRATORY_TRACT | Status: DC | PRN

## 2012-11-30 MED ORDER — METOPROLOL SUCCINATE ER 50 MG PO TB24
50.0000 mg | ORAL_TABLET | Freq: Every day | ORAL | Status: DC
  Filled 2012-11-30: qty 1

## 2012-11-30 MED ORDER — ASPIRIN 81 MG PO CHEW
324.0000 mg | CHEWABLE_TABLET | Freq: Once | ORAL | Status: AC
  Administered 2012-11-30: 324 mg via ORAL
  Filled 2012-11-30: qty 4

## 2012-11-30 MED ORDER — LISINOPRIL 2.5 MG PO TABS
2.5000 mg | ORAL_TABLET | Freq: Every day | ORAL | Status: DC
  Administered 2012-12-01 – 2012-12-02 (×2): 2.5 mg via ORAL
  Filled 2012-11-30 (×2): qty 1

## 2012-11-30 MED ORDER — ENOXAPARIN SODIUM 40 MG/0.4ML ~~LOC~~ SOLN
40.0000 mg | SUBCUTANEOUS | Status: DC
  Administered 2012-12-01 – 2012-12-02 (×2): 40 mg via SUBCUTANEOUS
  Filled 2012-11-30 (×2): qty 0.4

## 2012-11-30 MED ORDER — FUROSEMIDE 10 MG/ML IJ SOLN
40.0000 mg | Freq: Once | INTRAMUSCULAR | Status: AC
  Administered 2012-11-30: 40 mg via INTRAVENOUS
  Filled 2012-11-30: qty 4

## 2012-11-30 MED ORDER — FUROSEMIDE 10 MG/ML IJ SOLN
40.0000 mg | Freq: Two times a day (BID) | INTRAMUSCULAR | Status: DC
  Administered 2012-12-01 – 2012-12-02 (×3): 40 mg via INTRAVENOUS
  Filled 2012-11-30 (×4): qty 4

## 2012-11-30 MED ORDER — SODIUM CHLORIDE 0.9 % IJ SOLN: 3.0000 mL | INTRAMUSCULAR | Status: DC | PRN

## 2012-11-30 MED ORDER — ACETAMINOPHEN 325 MG PO TABS: 650.0000 mg | ORAL_TABLET | ORAL | Status: DC | PRN

## 2012-11-30 MED ORDER — SODIUM CHLORIDE 0.9 % IV SOLN: 250.0000 mL | INTRAVENOUS | Status: DC | PRN

## 2012-11-30 MED ORDER — SODIUM CHLORIDE 0.9 % IJ SOLN
3.0000 mL | Freq: Two times a day (BID) | INTRAMUSCULAR | Status: DC
  Administered 2012-12-01 – 2012-12-02 (×3): 3 mL via INTRAVENOUS

## 2012-11-30 MED ORDER — ONDANSETRON HCL 4 MG/2ML IJ SOLN
4.0000 mg | Freq: Four times a day (QID) | INTRAMUSCULAR | Status: DC | PRN
  Administered 2012-12-01: 4 mg via INTRAVENOUS
  Filled 2012-11-30: qty 2

## 2012-11-30 MED ORDER — POTASSIUM CHLORIDE CRYS ER 20 MEQ PO TBCR
20.0000 meq | EXTENDED_RELEASE_TABLET | Freq: Two times a day (BID) | ORAL | Status: DC
  Administered 2012-12-01 – 2012-12-02 (×4): 20 meq via ORAL
  Filled 2012-11-30 (×5): qty 1

## 2012-11-30 NOTE — H&P (Signed)
[name] is an 56 y.o. female.    Primary Cardiologist: Dr. Hillis Range  Chief Complaint: Shortness of breath for 2 days  HPI: 56 y/o female with a PVCs/NSVT, HTN and a history of MI 104 presenting with a 2 day history of progressive shortness of breath and dyspnea after walking around the house. Her symptoms started a few days after she had a upper respiratory tract symptoms of cough and sneezing. Over the last 2 days, her symptoms have gotten progressively worse.  She denies any chest pain, nausea, diaphoresis, but admits to 1 pillow orthopnea and occasional PND.  In the ER, her ECG showed sinus rhythm with multiple PVCs in bigeminy, but there was no ST or T-wave changes to suggest ischemia.  Her first set of cardiac marker is unremarkable. Currently, she is clinically and hemodynamically stable.  A review of her medical record shows that she has been on Flecainide for symptomatic PVCs/NSVT.  Prior evaluation include a transthoracic echo from 12/26/2011 that showed mild LVH with LVEF 55-60%.  A pharmacologic nuclear stress test from 06/13/2012 shows no evidence of ischemia. New inferolateral hypokinesis with LVEF of 58%.  There is also a mention in one of her notes that she had a cardiac cath in 2012 that showed no CAD.  Past Medical History  Diagnosis Date  . Myocardial infarction 1997  . Coronary artery disease   . Hypertension   . Arthritis   . Problem with literacy   . Shortness of breath   . Anxiety   . Bronchitis   . Chronic headaches   . Obesity   . COPD (chronic obstructive pulmonary disease)     Past Surgical History  Procedure Date  . Ectopic pregnancy surgery     right    Family History  Problem Relation Age of Onset  . Cancer Mother 32    lung ca  . Diabetes Mother   . Anxiety disorder Sister   . Cancer Brother 35    lung   Social History:  reports that she quit smoking about 1 years ago. She does not have any smokeless tobacco history on file. She  reports that she does not drink alcohol or use illicit drugs.  Allergies:  Allergies  Allergen Reactions  . Penicillins Rash     (Not in a hospital admission)  Results for orders placed during the hospital encounter of 11/30/12 (from the past 48 hour(s))  BASIC METABOLIC PANEL     Status: Abnormal   Collection Time   11/30/12  7:24 PM      Component Value Range Comment   Sodium 141  135 - 145 mEq/L    Potassium 3.9  3.5 - 5.1 mEq/L    Chloride 104  96 - 112 mEq/L    CO2 26  19 - 32 mEq/L    Glucose, Bld 102 (*) 70 - 99 mg/dL    BUN 14  6 - 23 mg/dL    Creatinine, Ser 1.61  0.50 - 1.10 mg/dL    Calcium 9.2  8.4 - 09.6 mg/dL    GFR calc non Af Amer >90  >90 mL/min    GFR calc Af Amer >90  >90 mL/min   CBC WITH DIFFERENTIAL     Status: Abnormal   Collection Time   11/30/12  7:24 PM      Component Value Range Comment   WBC 7.7  4.0 - 10.5 K/uL    RBC 4.27  3.87 - 5.11 MIL/uL  Hemoglobin 11.8 (*) 12.0 - 15.0 g/dL    HCT 16.1  09.6 - 04.5 %    MCV 85.7  78.0 - 100.0 fL    MCH 27.6  26.0 - 34.0 pg    MCHC 32.2  30.0 - 36.0 g/dL    RDW 40.9  81.1 - 91.4 %    Platelets 263  150 - 400 K/uL    Neutrophils Relative 62  43 - 77 %    Neutro Abs 4.8  1.7 - 7.7 K/uL    Lymphocytes Relative 29  12 - 46 %    Lymphs Abs 2.2  0.7 - 4.0 K/uL    Monocytes Relative 7  3 - 12 %    Monocytes Absolute 0.5  0.1 - 1.0 K/uL    Eosinophils Relative 3  0 - 5 %    Eosinophils Absolute 0.2  0.0 - 0.7 K/uL    Basophils Relative 0  0 - 1 %    Basophils Absolute 0.0  0.0 - 0.1 K/uL   TROPONIN I     Status: Normal   Collection Time   11/30/12  7:30 PM      Component Value Range Comment   Troponin I <0.30  <0.30 ng/mL   PRO B NATRIURETIC PEPTIDE     Status: Abnormal   Collection Time   11/30/12  7:30 PM      Component Value Range Comment   Pro B Natriuretic peptide (BNP) 2138.0 (*) 0 - 125 pg/mL    Dg Chest Port 1 View  11/30/2012  *RADIOLOGY REPORT*  Clinical Data: Shortness of breath   PORTABLE CHEST - 1 VIEW  Comparison: 10/05/2012  Findings: Cardiomegaly with pulmonary vascular congestion and possible mild interstitial edema.  No pleural effusion or pneumothorax.  IMPRESSION: Cardiomegaly with pulmonary vascular congestion and possible mild interstitial edema.   Original Report Authenticated By: Charline Bills, M.D.     Review of Systems  Constitutional: Negative for fever, chills, weight loss, malaise/fatigue and diaphoresis.  HENT: Negative for hearing loss, ear pain, nosebleeds, congestion, sore throat, neck pain, tinnitus and ear discharge.   Eyes: Negative for blurred vision, double vision, photophobia, pain, discharge and redness.  Respiratory: Positive for cough and shortness of breath. Negative for hemoptysis, sputum production, wheezing and stridor.   Cardiovascular: Positive for palpitations, orthopnea and PND. Negative for chest pain, claudication and leg swelling.  Gastrointestinal: Negative for heartburn, nausea, vomiting, abdominal pain, diarrhea, constipation, blood in stool and melena.  Genitourinary: Negative for dysuria, urgency, frequency, hematuria and flank pain.  Musculoskeletal: Negative for myalgias, back pain, joint pain and falls.  Skin: Negative for itching and rash.  Neurological: Negative for dizziness, tingling, tremors, sensory change, speech change, focal weakness, weakness and headaches.  Endo/Heme/Allergies: Negative for environmental allergies and polydipsia. Does not bruise/bleed easily.  Psychiatric/Behavioral: Negative for depression, suicidal ideas, hallucinations and substance abuse. The patient is not nervous/anxious and does not have insomnia.     Blood pressure 117/59, pulse 78, temperature 98.4 F (36.9 C), resp. rate 16, SpO2 100.00%. Physical Exam  Constitutional: She is oriented to person, place, and time. She appears well-developed and well-nourished. No distress.  HENT:  Head: Normocephalic.  Eyes: EOM are normal. Right  eye exhibits no discharge. Left eye exhibits no discharge. No scleral icterus.  Neck: Normal range of motion. Neck supple. No JVD present.  Cardiovascular: Normal rate, regular rhythm and normal heart sounds.  Exam reveals no friction rub.   No murmur heard. Respiratory: Effort  normal. No stridor. No respiratory distress. She has no wheezes. She has rales. She exhibits no tenderness.  GI: She exhibits no distension. There is no tenderness. There is no rebound and no guarding.  Musculoskeletal: She exhibits no edema and no tenderness.  Neurological: She is alert and oriented to person, place, and time. No cranial nerve deficit.  Skin: No rash noted. She is not diaphoretic. No erythema.  Psychiatric: She has a normal mood and affect.     Assessment/Plan  1. CHF-New new onset  2. HTN 3. Multiple monomorphic PVCS  I will admit the patient the cardiology service to be observed on telemetry.  She has clinical signs of CHF, but the etiology and type is currently unknown. She admits to a recent episode of URI symptoms last week suggesting the possibility that myocarditis is a possible etiology.  I will start the patient on Lasix iv and a low-dose ACEI, and continue her beta-blockers.  I will obtain serial cardiac markers to rule out MI.  I will obtain a transthoracic echocardiogram in the morning to evaluate her systolic and diastolic function. Based on the results of the above test, we will determine if there is a need for her to undergo a cardiac cath, if we suspect ischemia.  Moussa Wiegand E 11/30/2012, 10:55 PM

## 2012-11-30 NOTE — ED Provider Notes (Signed)
History     CSN: [id number]  Arrival date & time 11/30/12  1806   First MD Initiated Contact with Patient 11/30/12 1949      Chief Complaint  Patient presents with  . Shortness of Breath     Patient is a 56 y.o. female presenting with shortness of breath. The history is provided by the patient.  Shortness of Breath  The current episode started 3 to 5 days ago. The onset was gradual. The problem occurs frequently. The problem has been gradually worsening. The problem is moderate. The symptoms are relieved by rest. The symptoms are aggravated by activity and a supine position. Associated symptoms include orthopnea, cough and shortness of breath. Pertinent negatives include no chest pain and no fever.  pt presents with progressive dyspnea on exertion and orthopnea over past 3 days No syncope No new LE edema She reports nonproductive cough  Past Medical History  Diagnosis Date  . Myocardial infarction 1997  . Coronary artery disease   . Hypertension   . Arthritis   . Problem with literacy   . Shortness of breath   . Anxiety   . Bronchitis   . Chronic headaches   . Obesity   . COPD (chronic obstructive pulmonary disease)     Past Surgical History  Procedure Date  . Ectopic pregnancy surgery     right    Family History  Problem Relation Age of Onset  . Cancer Mother 46    lung ca  . Diabetes Mother   . Anxiety disorder Sister   . Cancer Brother 60    lung    History  Substance Use Topics  . Smoking status: Former Smoker -- 1.0 packs/day for 3 years    Quit date: 12/03/2010  . Smokeless tobacco: Not on file  . Alcohol Use: No    OB History    Grav Para Term Preterm Abortions TAB SAB Ect Mult Living                  Review of Systems  Constitutional: Negative for fever.  Respiratory: Positive for cough and shortness of breath.   Cardiovascular: Positive for orthopnea. Negative for chest pain.  Gastrointestinal: Negative for abdominal pain.   Musculoskeletal: Negative for back pain.  Skin: Negative for color change.  All other systems reviewed and are negative.    Allergies  Penicillins  Home Medications   Current Outpatient Rx  Name  Route  Sig  Dispense  Refill  . ALBUTEROL SULFATE HFA 108 (90 BASE) MCG/ACT IN AERS   Inhalation   Inhale 2 puffs into the lungs every 2 (two) hours as needed for wheezing or shortness of breath (cough).   1 Inhaler   5   . ATORVASTATIN CALCIUM 10 MG PO TABS   Oral   Take 10 mg by mouth daily.         Marland Kitchen DIPHENHYDRAMINE-APAP (SLEEP) 25-500 MG PO TABS   Oral   Take 1 tablet by mouth at bedtime as needed. For pain & sleep         . LEVOTHYROXINE SODIUM 137 MCG PO TABS   Oral   Take 137 mcg by mouth daily.         Marland Kitchen METOPROLOL SUCCINATE ER 50 MG PO TB24   Oral   Take 50 mg by mouth daily. Take with or immediately following a meal.         . OXYCODONE-ACETAMINOPHEN 5-325 MG PO TABS   Oral  Take 1 tablet by mouth every 6 (six) hours as needed for pain.   15 tablet   0     BP 117/45  Pulse 73  Temp 98.4 F (36.9 C)  Resp 18  SpO2 100%  Physical Exam CONSTITUTIONAL: Well developed/well nourished HEAD AND FACE: Normocephalic/atraumatic EYES: EOMI/PERRL ENMT: Mucous membranes moist NECK: supple no meningeal signs SPINE:entire spine nontender CV: S1/S2 noted, no murmurs/rubs/gallops noted LUNGS: crackles noted bilaterally.  No distress noted ABDOMEN: soft, nontender, no rebound or guarding GU:no cva tenderness NEURO: Pt is awake/alert, moves all extremitiesx4 EXTREMITIES: pulses normal, full ROM SKIN: warm, color normal PSYCH: no abnormalities of mood noted  ED Course  Procedures   Labs Reviewed  CBC WITH DIFFERENTIAL - Abnormal; Notable for the following:    Hemoglobin 11.8 (*)     All other components within normal limits  BASIC METABOLIC PANEL  TROPONIN I  PRO B NATRIURETIC PEPTIDE   Dg Chest Port 1 View  11/30/2012  *RADIOLOGY REPORT*   Clinical Data: Shortness of breath  PORTABLE CHEST - 1 VIEW  Comparison: 10/05/2012  Findings: Cardiomegaly with pulmonary vascular congestion and possible mild interstitial edema.  No pleural effusion or pneumothorax.  IMPRESSION: Cardiomegaly with pulmonary vascular congestion and possible mild interstitial edema.   Original Report Authenticated By: Charline Bills, M.D.    11:02 PM Pt with progressive SOB/orthopnea for past 3 days CXR/labs indicate early CHF.  This appears new onset Will admit.  D/w cardiology fellow to admit   MDM  Nursing notes including past medical history and social history reviewed and considered in documentation xrays reviewed and considered Labs/vital reviewed and considered Previous records reviewed and considered - previous echo did not reveal CHF        Date: 11/30/2012  Rate: 98  Rhythm: normal sinus rhythm  QRS Axis: normal  Intervals: normal  ST/T Wave abnormalities: nonspecific ST changes  Conduction Disutrbances:none  Narrative Interpretation:   Old EKG Reviewed: changes noted PVC noted   Joya Gaskins, MD 11/30/12 2303

## 2012-11-30 NOTE — ED Notes (Signed)
Patient states increasing sob x 3 days, more so with exertion

## 2012-12-01 ENCOUNTER — Ambulatory Visit: Payer: Medicaid Other | Admitting: Family Medicine

## 2012-12-01 ENCOUNTER — Encounter (HOSPITAL_COMMUNITY): Payer: Self-pay | Admitting: General Practice

## 2012-12-01 DIAGNOSIS — I509 Heart failure, unspecified: Secondary | ICD-10-CM | POA: Insufficient documentation

## 2012-12-01 DIAGNOSIS — I4949 Other premature depolarization: Secondary | ICD-10-CM

## 2012-12-01 LAB — TSH: TSH: 0.044 u[IU]/mL — ABNORMAL LOW (ref 0.350–4.500)

## 2012-12-01 LAB — BASIC METABOLIC PANEL
Calcium: 9.4 mg/dL (ref 8.4–10.5)
Chloride: 104 mEq/L (ref 96–112)
Creatinine, Ser: 0.7 mg/dL (ref 0.50–1.10)
GFR calc Af Amer: >90 mL/min (ref >90)

## 2012-12-01 LAB — TROPONIN I: Troponin I: <0.3 ng/mL (ref <0.30)

## 2012-12-01 MED ORDER — METOPROLOL SUCCINATE ER 50 MG PO TB24
75.0000 mg | ORAL_TABLET | Freq: Every day | ORAL | Status: DC
  Administered 2012-12-01 – 2012-12-02 (×2): 75 mg via ORAL
  Filled 2012-12-01 (×3): qty 1

## 2012-12-01 MED ORDER — FLECAINIDE ACETATE 100 MG PO TABS
100.0000 mg | ORAL_TABLET | Freq: Two times a day (BID) | ORAL | Status: DC
  Administered 2012-12-01 – 2012-12-02 (×3): 100 mg via ORAL
  Filled 2012-12-01 (×4): qty 1

## 2012-12-01 MED ORDER — MAGNESIUM SULFATE 40 MG/ML IJ SOLN
2.0000 g | Freq: Once | INTRAMUSCULAR | Status: AC
  Administered 2012-12-01: 2 g via INTRAVENOUS
  Filled 2012-12-01: qty 50

## 2012-12-01 MED ORDER — OXYCODONE-ACETAMINOPHEN 5-325 MG PO TABS
1.0000 | ORAL_TABLET | ORAL | Status: DC | PRN
  Administered 2012-12-01: 2 via ORAL
  Administered 2012-12-01: 1 via ORAL
  Filled 2012-12-01: qty 1
  Filled 2012-12-01 (×2): qty 2
  Filled 2012-12-01: qty 1

## 2012-12-01 NOTE — Progress Notes (Signed)
  Echocardiogram 2D Echocardiogram has been performed.  Georgian Co 12/01/2012, 12:21 PM

## 2012-12-01 NOTE — Progress Notes (Addendum)
SUBJECTIVE: The patient is improved today with diuresis.  At this time, she denies chest pain, dizziness, presyncope, or any new concerns.     Marland Kitchen atorvastatin  10 mg Oral q1800  . enoxaparin  40 mg Subcutaneous Q24H  . furosemide  40 mg Intravenous BID  . levothyroxine  137 mcg Oral QAC breakfast  . lisinopril  2.5 mg Oral Daily  . metoprolol succinate  50 mg Oral Daily  . potassium chloride  20 mEq Oral BID  . sodium chloride  3 mL Intravenous Q12H      OBJECTIVE: Physical Exam: Filed Vitals:   11/30/12 2315 11/30/12 2345 12/01/12 0001 12/01/12 0457  BP: 120/52 113/80 139/92 112/48  Pulse: 59 65 64 78  Temp:   98.9 F (37.2 C) 98.3 F (36.8 C)  TempSrc:   Oral Oral  Resp: 12 19 18 18   Height:   5\' 8"  (1.727 m)   Weight:   227 lb 1.6 oz (103.012 kg) 226 lb 8 oz (102.74 kg)  SpO2: 97% 99% 100% 97%    Intake/Output Summary (Last 24 hours) at 12/01/12 0753 Last data filed at 12/01/12 0558  Gross per 24 hour  Intake    170 ml  Output   2200 ml  Net  -2030 ml    Telemetry reveals sinus rhythm with frequent PVCs  GEN- The patient is well appearing, alert and oriented x 3 today.   Head- normocephalic, atraumatic Eyes-  Sclera clear, conjunctiva pink Ears- hearing intact Oropharynx- clear Neck- supple,+JVP Lungs- bibasilar rales, normal work of breathing Heart- Regular rate and rhythm with frequent ectopy, no murmurs, rubs or gallops, PMI not laterally displaced GI- soft, NT, ND, + BS Extremities- no clubbing, cyanosis, or edema Skin- no rash or lesion Psych- euthymic mood, full affect Neuro- strength and sensation are intact  LABS: Basic Metabolic Panel:  Basename 12/01/12 0500 11/30/12 2251 11/30/12 1924  NA 142 -- 141  K 4.9 -- 3.9  CL 104 -- 104  CO2 25 -- 26  GLUCOSE 92 -- 102*  BUN 13 -- 14  CREATININE 0.70 0.74 --  CALCIUM 9.4 -- 9.2  MG 1.9 1.8 --  PHOS -- -- --   Liver Function Tests: No results found for this basename:  AST:2,ALT:2,ALKPHOS:2,BILITOT:2,PROT:2,ALBUMIN:2 in the last 72 hours No results found for this basename: LIPASE:2,AMYLASE:2 in the last 72 hours CBC:  Basename 11/30/12 2251 11/30/12 1924  WBC 7.7 7.7  NEUTROABS -- 4.8  HGB 12.8 11.8*  HCT 38.9 36.6  MCV 85.5 85.7  PLT 280 263   Cardiac Enzymes:  Basename 12/01/12 0500 11/30/12 2254 11/30/12 1930  CKTOTAL -- -- --  CKMB -- -- --  CKMBINDEX -- -- --  TROPONINI <0.30 <0.30 <0.30   ASSESSMENT AND PLAN:  Active Problems:  * No active hospital problems. *   1. Acute CHF Unclear etiology Remains volume overloaded but clinically improved Continue IV lasix today and convert to PO tomorrow Will repeat echo at this time.  As CMs are negative and recent stress myoview was normal, I do not plan ischemic workup unless EF has significantly changed.  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   Hillis Range, MD 12/01/2012 7:53 AM

## 2012-12-01 NOTE — Plan of Care (Signed)
Problem: Phase I Progression Outcomes Goal: EF % per last Echo/documented,Core Reminder form on chart Outcome: Completed/Met Date Met:  12/01/12 Last EF 45-50% per echo on 12/01/12

## 2012-12-01 NOTE — Progress Notes (Signed)
Pt arrived to floor in no apparent distress. Pt VSS. Pt oriented to room and floor. Will continue to monitor and assess Baron Hamper RN 12/01/2012 12:09 AM

## 2012-12-01 NOTE — Progress Notes (Signed)
   CARE MANAGEMENT NOTE 12/01/2012  Patient:  [name], [name]   Account Number:  [id number]  Date Initiated:  12/01/2012  Documentation initiated by:  Darlyne Russian  Subjective/Objective Assessment:   Patient admitted with shortness of breath     Action/Plan:   Progression of care and discharge planning   Anticipated DC Date:     Anticipated DC Plan:        DC Planning Services  CM consult      Choice offered to / List presented to:             Status of service:   Medicare Important Message given?   (If response is "NO", the following Medicare IM given date fields will be blank) Date Medicare IM given:   Date Additional Medicare IM given:    Discharge Disposition:    Per UR Regulation:    If discussed at Long Length of Stay Meetings, dates discussed:    Comments:  12/01/2012  [address] RN, [place]  [phone] CM referral:  home health heart failure program  Met with patient to discuss discharge planning and home health needs. She has never had home health care. CM to follow regarding MD orders for home health care.

## 2012-12-02 ENCOUNTER — Encounter (HOSPITAL_COMMUNITY): Payer: Self-pay | Admitting: Nurse Practitioner

## 2012-12-02 DIAGNOSIS — I5022 Chronic systolic (congestive) heart failure: Secondary | ICD-10-CM

## 2012-12-02 LAB — BASIC METABOLIC PANEL
CO2: 25 mEq/L (ref 19–32)
Chloride: 94 mEq/L — ABNORMAL LOW (ref 96–112)
Glucose, Bld: 114 mg/dL — ABNORMAL HIGH (ref 70–99)
Potassium: 4.2 mEq/L (ref 3.5–5.1)
Sodium: 133 mEq/L — ABNORMAL LOW (ref 135–145)

## 2012-12-02 MED ORDER — FUROSEMIDE 20 MG PO TABS: 20.0000 mg | ORAL_TABLET | Freq: Every day | ORAL | Status: DC

## 2012-12-02 MED ORDER — METOPROLOL SUCCINATE ER 50 MG PO TB24: 50.0000 mg | ORAL_TABLET | Freq: Every day | ORAL | Status: DC

## 2012-12-02 MED ORDER — FLECAINIDE ACETATE 100 MG PO TABS: 100.0000 mg | ORAL_TABLET | Freq: Two times a day (BID) | ORAL | Status: DC

## 2012-12-02 MED ORDER — LISINOPRIL 2.5 MG PO TABS: 2.5000 mg | ORAL_TABLET | Freq: Every day | ORAL | Status: DC

## 2012-12-02 MED ORDER — LEVOTHYROXINE SODIUM 112 MCG PO TABS: 112.0000 ug | ORAL_TABLET | Freq: Every day | ORAL | Status: DC

## 2012-12-02 NOTE — Progress Notes (Signed)
Patient ID: [name], female   DOB: 1956-10-26, 56 y.o.   MRN: [id number]   SUBJECTIVE: No chest pain dyspnea or palpitatins     . atorvastatin  10 mg Oral q1800  . enoxaparin  40 mg Subcutaneous Q24H  . flecainide  100 mg Oral Q12H  . furosemide  40 mg Intravenous BID  . levothyroxine  137 mcg Oral QAC breakfast  . lisinopril  2.5 mg Oral Daily  . metoprolol succinate  75 mg Oral Daily  . potassium chloride  20 mEq Oral BID  . sodium chloride  3 mL Intravenous Q12H      OBJECTIVE: Physical Exam: Filed Vitals:   12/01/12 2143 12/02/12 0100 12/02/12 0231 12/02/12 0620  BP: 100/60 93/49 102/58 96/43  Pulse:  61  68  Temp:  98.7 F (37.1 C)  98.4 F (36.9 C)  TempSrc:  Oral  Oral  Resp:  18    Height:      Weight:    222 lb 14.2 oz (101.1 kg)  SpO2:  94%      Intake/Output Summary (Last 24 hours) at 12/02/12 4098 Last data filed at 12/01/12 2200  Gross per 24 hour  Intake    724 ml  Output   2750 ml  Net  -2026 ml    Telemetry reveals sinus rhythm with frequent PVCs  GEN- The patient is well appearing, alert and oriented x 3 today.   Head- normocephalic, atraumatic Eyes-  Sclera clear, conjunctiva pink Ears- hearing intact Oropharynx- clear Neck- supple,+JVP Lungs- bibasilar rales, normal work of breathing Heart- Regular rate and rhythm with frequent ectopy, no murmurs, rubs or gallops, PMI not laterally displaced GI- soft, NT, ND, + BS Extremities- no clubbing, cyanosis, or edema Skin- no rash or lesion Psych- euthymic mood, full affect Neuro- strength and sensation are intact  LABS: Basic Metabolic Panel:  Basename 12/01/12 0500 11/30/12 2251 11/30/12 1924  NA 142 -- 141  K 4.9 -- 3.9  CL 104 -- 104  CO2 25 -- 26  GLUCOSE 92 -- 102*  BUN 13 -- 14  CREATININE 0.70 0.74 --  CALCIUM 9.4 -- 9.2  MG 1.9 1.8 --  PHOS -- -- --   Liver Function Tests: No results found for this basename: AST:2,ALT:2,ALKPHOS:2,BILITOT:2,PROT:2,ALBUMIN:2 in the  last 72 hours No results found for this basename: LIPASE:2,AMYLASE:2 in the last 72 hours CBC:  Basename 11/30/12 2251 11/30/12 1924  WBC 7.7 7.7  NEUTROABS -- 4.8  HGB 12.8 11.8*  HCT 38.9 36.6  MCV 85.5 85.7  PLT 280 263   Cardiac Enzymes:  Basename 12/01/12 1140 12/01/12 0500 11/30/12 2254  CKTOTAL -- -- --  CKMB -- -- --  CKMBINDEX -- -- --  TROPONINI <0.30 <0.30 <0.30   ASSESSMENT AND PLAN:  Active Problems:  Acute CHF  1. Acute CHF Unclear etiology Improved euvolemic Echo with EF 50% no definitive RWMA;s more likely diastolic dysfunction D/C per Dr Jenel Lucks note add lasix 20 mg to home meds.  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

## 2012-12-02 NOTE — Progress Notes (Signed)
All d/c instructions explained and given to pt.  Verbalized understanding.  Pt d/c home via ambulation.  Refused w/c service.  Amanda Pea, Charity fundraiser.

## 2012-12-02 NOTE — Discharge Summary (Signed)
Patient ID: [name],  MRN: [id number], DOB/AGE: 55/30/1957 55 y.o.  Admit date: 11/30/2012 Discharge date: 12/02/2012  Primary Care Provider: Delbert Harness Primary Cardiologist: Shela Commons. Allred, MD  Discharge Diagnoses Principal Problem:  *Acute diastolic CHF (congestive heart failure)  **s/p diuresis this admission with reduction in weight from 227 lbs to 222 lbs with net-negative diuresis of 3.7 Liters. Active Problems:  Palpitations  Premature ventricular contraction  **Ran out of flecainide and metoprolol one week prior to admission.  Obesity  Hypothyroidism with TSH of 0.044 found this admission->levothyroxine dose reduced.  Hyperlipidemia LDL goal < 100  Anxiety  COPD (chronic obstructive pulmonary disease)   Allergies Allergies  Allergen Reactions  . Penicillins Rash   Procedures  2D Echocardiogram 12/01/2012  Study Conclusions  Left ventricle: The cavity size was normal. Wall thickness was normal. Systolic function was mildly reduced. The estimated ejection fraction was in the range of 45% to 50%. _____________  History of Present Illness  56 y/o female with prior h/o chest pain s/p normal cath in 2012 and negative myoview in 05/2012.  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  She ran out of her medications approximately 1 week prior to admission and subsequently developed progressive shotness of breath and DOE. She presented to the Ed on 12/16 where CXR showed vascular congestion with possible mild interstitial edema.  She was admitted for further evaluation and diuresis.  Hospital Course  Pt r/o for MI.  She was placed on IV lasix with brisk diuresis and reduction in weight by 5 lbs with clinical improvement.  Echocardiogram was performed on 12/17 revealing an EF of approximately 45-50%.  It was felt that her CHF was predominantly diastolic in nature.  She has been  transitioned to oral lasix and will be discharged home today in good condition.  She was seen by EP who recommended continuation of bb and flecainide therapy along with outpt f/u for consideration of PVC ablation and this has been arranged.  Of note, TSH was found to be markedly suppressed @ 0.044.  We have reduced her levothyroxine dose to 112 mcg daily from 137 mcg daily and she will require repeat TFT's with her PCP in the next 4-6 weeks.  Discharge Vitals Blood pressure 116/74, pulse 76, temperature 98.4 F (36.9 C), temperature source Oral, resp. rate 18, height 5\' 8"  (1.727 m), weight 222 lb 14.2 oz (101.1 kg), SpO2 94.00%.  Filed Weights   12/01/12 0001 12/01/12 0457 12/02/12 0620  Weight: 227 lb 1.6 oz (103.012 kg) 226 lb 8 oz (102.74 kg) 222 lb 14.2 oz (101.1 kg)   Labs  CBC  Basename 11/30/12 2251 11/30/12 1924  WBC 7.7 7.7  NEUTROABS -- 4.8  HGB 12.8 11.8*  HCT 38.9 36.6  MCV 85.5 85.7  PLT 280 263   Basic Metabolic Panel  Basename 12/02/12 0830 12/01/12 0500 11/30/12 2251  NA 133* 142 --  K 4.2 4.9 --  CL 94* 104 --  CO2 25 25 --  GLUCOSE 114* 92 --  BUN 28* 13 --  CREATININE 1.14* 0.70 --  CALCIUM 9.6 9.4 --  MG -- 1.9 1.8  PHOS -- -- --   Cardiac Enzymes  Basename 12/01/12 1140 12/01/12 0500 11/30/12 2254  CKTOTAL -- -- --  CKMB -- -- --  CKMBINDEX -- -- --  TROPONINI <0.30 <0.30 <0.30   Thyroid Function Tests  Basename 11/30/12 2251  TSH 0.044*  T4TOTAL --  T3FREE --  THYROIDAB --   Disposition  Pt is being discharged home today in good condition.  Follow-up Plans & Appointments      Follow-up Information    Follow up with Hillis Range, MD. On 12/30/2012. (12:00)    Contact information:   [address] [place] [address] [phone]       Follow up with Tereso Newcomer, PA. On 12/14/2012. (8:50 AM)    Contact information:   [address] [place] [address] [phone]        Discharge  Medications    Medication List     As of 12/02/2012  1:10 PM    TAKE these medications         albuterol 108 (90 BASE) MCG/ACT inhaler   Commonly known as: PROVENTIL HFA;VENTOLIN HFA   Inhale 2 puffs into the lungs every 2 (two) hours as needed for wheezing or shortness of breath (cough).      atorvastatin 10 MG tablet   Commonly known as: LIPITOR   Take 10 mg by mouth daily.      diphenhydramine-acetaminophen 25-500 MG Tabs   Commonly known as: TYLENOL PM   Take 1 tablet by mouth at bedtime as needed. For pain & sleep      flecainide 100 MG tablet   Commonly known as: TAMBOCOR   Take 1 tablet (100 mg total) by mouth every 12 (twelve) hours.      furosemide 20 MG tablet   Commonly known as: LASIX   Take 1 tablet (20 mg total) by mouth daily.      levothyroxine 112 MCG tablet   Commonly known as: SYNTHROID, LEVOTHROID   Take 1 tablet (112 mcg total) by mouth daily.      lisinopril 2.5 MG tablet   Commonly known as: PRINIVIL,ZESTRIL   Take 1 tablet (2.5 mg total) by mouth daily.      metoprolol succinate 50 MG 24 hr tablet   Commonly known as: TOPROL-XL   Take 1 tablet (50 mg total) by mouth daily. Take with or immediately following a meal.      oxyCODONE-acetaminophen 5-325 MG per tablet   Commonly known as: PERCOCET/ROXICET   Take 1 tablet by mouth every 6 (six) hours as needed for pain.       Outstanding Labs/Studies  BMET upon f/u.  Duration of Discharge Encounter   Greater than 30 minutes including physician time.  Signed, Nicolasa Ducking NP 12/02/2012, 1:10 PM

## 2012-12-03 ENCOUNTER — Ambulatory Visit: Payer: Self-pay | Admitting: Internal Medicine

## 2012-12-07 NOTE — Progress Notes (Signed)
Utilization Review Completed.   Nashanti Duquette, RN, BSN Nurse Case Manager  [phone]  

## 2012-12-10 ENCOUNTER — Telehealth: Payer: Self-pay | Admitting: Internal Medicine

## 2012-12-10 NOTE — Telephone Encounter (Signed)
New Problem:    Patient called in needing a refill of her albuterol (PROVENTIL HFA;VENTOLIN HFA) 108 (90 BASE) MCG/ACT inhaler.

## 2012-12-11 ENCOUNTER — Telehealth: Payer: Self-pay | Admitting: *Deleted

## 2012-12-11 ENCOUNTER — Other Ambulatory Visit: Payer: Self-pay | Admitting: Family Medicine

## 2012-12-11 MED ORDER — ALBUTEROL SULFATE HFA 108 (90 BASE) MCG/ACT IN AERS: 2.0000 | INHALATION_SPRAY | RESPIRATORY_TRACT | Status: DC | PRN

## 2012-12-11 NOTE — Telephone Encounter (Signed)
OV with me 1st seems very appropriate   Thanks      Vonna Kotyk       ----- Message -----   From: Maple Hudson, RN   Sent: 12/11/2012 9:46 AM   To: Beverley Fiedler, MD      Dr Rhea Belton: pt is scheduled for direct screening colonoscopy 1/13. PV scheduled for MON 12/30. Hospital admission for CHF 12/16. (Documented in EPIC) Would you review her chart to see if she is okay for direct colonoscopy or should she have OV with you first? Thanks, Olegario Messier                  Pt scheduled for new patient appointment with Dr. Rhea Belton on 12/30/2012 at 9:45.  Colonoscopy scheduled for 1/13 cancelled.  Pt aware of appointment.

## 2012-12-11 NOTE — Telephone Encounter (Signed)
Called patient back to inform her Dr Johney Frame wants her to refill Albuterol through PCP.   [name] CMA

## 2012-12-11 NOTE — Telephone Encounter (Signed)
[facility] on [address] has told the patient they need to call Dr. Earnest Bailey for a refill on her inhaler.  It looks like a refill request have been sent, but not to the correct doctor.

## 2012-12-14 ENCOUNTER — Ambulatory Visit: Payer: Medicaid Other | Admitting: Physician Assistant

## 2012-12-24 ENCOUNTER — Encounter (HOSPITAL_COMMUNITY): Payer: Self-pay | Admitting: Emergency Medicine

## 2012-12-24 ENCOUNTER — Emergency Department (HOSPITAL_COMMUNITY)
Admission: EM | Admit: 2012-12-24 | Discharge: 2012-12-24 | Disposition: A | Discharge status: Home / Self Care | Payer: Medicaid Other | Attending: Emergency Medicine | Admitting: Emergency Medicine

## 2012-12-24 ENCOUNTER — Emergency Department (HOSPITAL_COMMUNITY): Payer: Medicaid Other

## 2012-12-24 DIAGNOSIS — E669 Obesity, unspecified: Secondary | ICD-10-CM | POA: Insufficient documentation

## 2012-12-24 DIAGNOSIS — J4489 Other specified chronic obstructive pulmonary disease: Secondary | ICD-10-CM | POA: Insufficient documentation

## 2012-12-24 DIAGNOSIS — Z8659 Personal history of other mental and behavioral disorders: Secondary | ICD-10-CM | POA: Insufficient documentation

## 2012-12-24 DIAGNOSIS — J449 Chronic obstructive pulmonary disease, unspecified: Secondary | ICD-10-CM | POA: Insufficient documentation

## 2012-12-24 DIAGNOSIS — I509 Heart failure, unspecified: Secondary | ICD-10-CM | POA: Insufficient documentation

## 2012-12-24 DIAGNOSIS — Z8739 Personal history of other diseases of the musculoskeletal system and connective tissue: Secondary | ICD-10-CM | POA: Insufficient documentation

## 2012-12-24 DIAGNOSIS — J40 Bronchitis, not specified as acute or chronic: Secondary | ICD-10-CM | POA: Insufficient documentation

## 2012-12-24 DIAGNOSIS — Z8679 Personal history of other diseases of the circulatory system: Secondary | ICD-10-CM | POA: Insufficient documentation

## 2012-12-24 DIAGNOSIS — I10 Essential (primary) hypertension: Secondary | ICD-10-CM | POA: Insufficient documentation

## 2012-12-24 DIAGNOSIS — Z79899 Other long term (current) drug therapy: Secondary | ICD-10-CM | POA: Insufficient documentation

## 2012-12-24 DIAGNOSIS — J329 Chronic sinusitis, unspecified: Secondary | ICD-10-CM

## 2012-12-24 DIAGNOSIS — Z8709 Personal history of other diseases of the respiratory system: Secondary | ICD-10-CM | POA: Insufficient documentation

## 2012-12-24 DIAGNOSIS — Z87891 Personal history of nicotine dependence: Secondary | ICD-10-CM | POA: Insufficient documentation

## 2012-12-24 DIAGNOSIS — Z8719 Personal history of other diseases of the digestive system: Secondary | ICD-10-CM | POA: Insufficient documentation

## 2012-12-24 MED ORDER — AZITHROMYCIN 250 MG PO TABS: ORAL_TABLET | ORAL | Status: DC

## 2012-12-24 MED ORDER — FLUTICASONE PROPIONATE 50 MCG/ACT NA SUSP: 2.0000 | Freq: Every day | NASAL | Status: DC

## 2012-12-24 NOTE — ED Provider Notes (Signed)
History  This chart was scribed for non-physician practitioner working with Carleene Cooper III, MD by Ardeen Jourdain, ED Scribe. This patient was seen in room TR05C/TR05C and the patient's care was started at 1741.  CSN: [id number]  Arrival date & time 12/24/12  1736   First MD Initiated Contact with Patient 12/24/12 1741      Chief Complaint  Patient presents with  . URI  . Cough     The history is provided by the patient. No language interpreter was used.    [name] is a 57 y.o. female brought in by ambulance, who presents to the Emergency Department complaining of congestion with associated cough, chest congestion, rhinorrhea and chills. She states the symptoms began 1 day ago and have been gradually worsening. She describes her cough as non-productive but painful. She denies any fever at this time. She reports she states she ran out of her asthma medication 1 day ago. Pt has been seen multiple times in the past for similar symptoms.    Past Medical History  Diagnosis Date  . Chest pain     a. s/p reported MI in 1997;  b. 09/2012 Cath: nl cors, nl LV ([place]);  c. 05/2012 Non-ischemic myoview.  . Hypertension   . Arthritis   . Problem with literacy   . Shortness of breath   . Anxiety   . Bronchitis   . Chronic headaches   . Obesity   . COPD (chronic obstructive pulmonary disease)   . Chronic diastolic CHF (congestive heart failure)     a. 12/2011 Echo: nl ef, Gr 1 DD;  b. 11/2012 Echo: EF 45-50%, no significant valvular abnormalities.  Marland Kitchen GERD (gastroesophageal reflux disease)     Past Surgical History  Procedure Date  . Ectopic pregnancy surgery     right    Family History  Problem Relation Age of Onset  . Cancer Mother 38    lung ca  . Diabetes Mother   . Anxiety disorder Sister   . Cancer Brother 60    lung    History  Substance Use Topics  . Smoking status: Former Smoker -- 1.0 packs/day for 3 years    Quit date: 12/03/2010  . Smokeless  tobacco: Not on file  . Alcohol Use: No   No OB history available.   Review of Systems  HENT: Positive for congestion and rhinorrhea.   Respiratory: Positive for cough.   All other systems reviewed and are negative.    Allergies  Penicillins  Home Medications   Current Outpatient Rx  Name  Route  Sig  Dispense  Refill  . ALBUTEROL SULFATE HFA 108 (90 BASE) MCG/ACT IN AERS   Inhalation   Inhale 2 puffs into the lungs every 2 (two) hours as needed for wheezing or shortness of breath (cough).   1 Inhaler   5   . ATORVASTATIN CALCIUM 10 MG PO TABS   Oral   Take 10 mg by mouth daily.         Marland Kitchen FLECAINIDE ACETATE 100 MG PO TABS   Oral   Take 1 tablet (100 mg total) by mouth every 12 (twelve) hours.   30 tablet   6   . FUROSEMIDE 20 MG PO TABS   Oral   Take 1 tablet (20 mg total) by mouth daily.   30 tablet   6   . LEVOTHYROXINE SODIUM 112 MCG PO TABS   Oral   Take 112 mcg by mouth daily.         Marland Kitchen  LISINOPRIL 2.5 MG PO TABS   Oral   Take 1 tablet (2.5 mg total) by mouth daily.   30 tablet   6   . METOPROLOL SUCCINATE ER 50 MG PO TB24   Oral   Take 1 tablet (50 mg total) by mouth daily. Take with or immediately following a meal.   30 tablet   6     Triage Vitals: Pulse 55  Temp 97.6 F (36.4 C) (Oral)  Resp 16  SpO2 97%  Physical Exam  Nursing note and vitals reviewed. Constitutional: She is oriented to person, place, and time. She appears well-developed and well-nourished. No distress.  HENT:  Head: Normocephalic and atraumatic.  Mouth/Throat: No oropharyngeal exudate.       Pharynx mildly erythematous, nasal mucosa edema, maxillary and frontal tenderness, post nasal drip  Eyes: EOM are normal. Pupils are equal, round, and reactive to light.  Neck: Normal range of motion. Neck supple. No tracheal deviation present.  Cardiovascular: Normal rate, regular rhythm and normal heart sounds.  Exam reveals no gallop and no friction rub.   No murmur  heard. Pulmonary/Chest: Effort normal. No respiratory distress. She has no wheezes. She has no rales.       Rhonchi in left mid to lower lung field   Abdominal: Soft. Bowel sounds are normal. She exhibits no distension. There is no tenderness. There is no rebound and no guarding.  Musculoskeletal: Normal range of motion. She exhibits no edema.  Neurological: She is alert and oriented to person, place, and time.  Skin: Skin is warm and dry. No rash noted.  Psychiatric: She has a normal mood and affect. Her behavior is normal.    ED Course  Procedures (including critical care time)  DIAGNOSTIC STUDIES: Oxygen Saturation is 97% on room air, normal by my interpretation.    COORDINATION OF CARE:  6:20 PM: Discussed treatment plan which includes a CXR with pt at bedside and pt agreed to plan.     Labs Reviewed - No data to display Dg Chest 2 View  12/24/2012  *RADIOLOGY REPORT*  Clinical Data: Cough and congestion.  CHEST - 2 VIEW  Comparison: 11/30/2012.  Findings: Trachea is midline.  Heart size stable.  Minimal right apical pleural thickening.  Lungs are somewhat low in volume with linear scarring at the lung bases.  No pleural fluid.  IMPRESSION: No acute findings.   Original Report Authenticated By: Leanna Battles, M.D.      1. Sinusitis   2. Bronchitis       MDM  57 y/o female with sinusitis and bronchitis. Ronchi in left lung fields opted by decision to obtain CXR which was normal. Rx azithromycin. Return precautions discussed. Infection care and precautions discussed. Patient states understanding of plan and is agreeable.       I personally performed the services described in this documentation, which was scribed in my presence. The recorded information has been reviewed and is accurate.    Trevor Mace, PA-C 12/24/12 1901

## 2012-12-24 NOTE — Discharge Instructions (Signed)
Take antibiotic to completion. Use nasal spray as prescribed. Bronchitis Bronchitis is the body's way of reacting to injury and/or infection (inflammation) of the bronchi. Bronchi are the air tubes that extend from the windpipe into the lungs. If the inflammation becomes severe, it may cause shortness of breath. CAUSES  Inflammation may be caused by:  A virus.  Germs (bacteria).  Dust.  Allergens.  Pollutants and many other irritants. The cells lining the bronchial tree are covered with tiny hairs (cilia). These constantly beat upward, away from the lungs, toward the mouth. This keeps the lungs free of pollutants. When these cells become too irritated and are unable to do their job, mucus begins to develop. This causes the characteristic cough of bronchitis. The cough clears the lungs when the cilia are unable to do their job. Without either of these protective mechanisms, the mucus would settle in the lungs. Then you would develop pneumonia. Smoking is a common cause of bronchitis and can contribute to pneumonia. Stopping this habit is the single most important thing you can do to help yourself. TREATMENT   Your caregiver may prescribe an antibiotic if the cough is caused by bacteria. Also, medicines that open up your airways make it easier to breathe. Your caregiver may also recommend or prescribe an expectorant. It will loosen the mucus to be coughed up. Only take over-the-counter or prescription medicines for pain, discomfort, or fever as directed by your caregiver.  Removing whatever causes the problem (smoking, for example) is critical to preventing the problem from getting worse.  Cough suppressants may be prescribed for relief of cough symptoms.  Inhaled medicines may be prescribed to help with symptoms now and to help prevent problems from returning.  For those with recurrent (chronic) bronchitis, there may be a need for steroid medicines. SEEK IMMEDIATE MEDICAL CARE IF:    During treatment, you develop more pus-like mucus (purulent sputum).  You have a fever.  Your baby is older than 3 months with a rectal temperature of 102 F (38.9 C) or higher.  Your baby is 33 months old or younger with a rectal temperature of 100.4 F (38 C) or higher.  You become progressively more ill.  You have increased difficulty breathing, wheezing, or shortness of breath. It is necessary to seek immediate medical care if you are elderly or sick from any other disease. MAKE SURE YOU:   Understand these instructions.  Will watch your condition.  Will get help right away if you are not doing well or get worse. Document Released: 12/02/2005 Document Revised: 02/24/2012 Document Reviewed: 10/11/2008 [facility] Patient Information 2013 [place], [place]. Sinusitis Sinusitis is redness, soreness, and swelling (inflammation) of the paranasal sinuses. Paranasal sinuses are air pockets within the bones of your face (beneath the eyes, the middle of the forehead, or above the eyes). In healthy paranasal sinuses, mucus is able to drain out, and air is able to circulate through them by way of your nose. However, when your paranasal sinuses are inflamed, mucus and air can become trapped. This can allow bacteria and other germs to grow and cause infection. Sinusitis can develop quickly and last only a short time (acute) or continue over a long period (chronic). Sinusitis that lasts for more than 12 weeks is considered chronic.  CAUSES  Causes of sinusitis include:  Allergies.  Structural abnormalities, such as displacement of the cartilage that separates your nostrils (deviated septum), which can decrease the air flow through your nose and sinuses and affect sinus drainage.  Functional abnormalities, such as when the small hairs (cilia) that line your sinuses and help remove mucus do not work properly or are not present. SYMPTOMS  Symptoms of acute and chronic sinusitis are the same. The  primary symptoms are pain and pressure around the affected sinuses. Other symptoms include:  Upper toothache.  Earache.  Headache.  Bad breath.  Decreased sense of smell and taste.  A cough, which worsens when you are lying flat.  Fatigue.  Fever.  Thick drainage from your nose, which often is green and may contain pus (purulent).  Swelling and warmth over the affected sinuses. DIAGNOSIS  Your caregiver will perform a physical exam. During the exam, your caregiver may:  Look in your nose for signs of abnormal growths in your nostrils (nasal polyps).  Tap over the affected sinus to check for signs of infection.  View the inside of your sinuses (endoscopy) with a special imaging device with a light attached (endoscope), which is inserted into your sinuses. If your caregiver suspects that you have chronic sinusitis, one or more of the following tests may be recommended:  Allergy tests.  Nasal culture A sample of mucus is taken from your nose and sent to a lab and screened for bacteria.  Nasal cytology A sample of mucus is taken from your nose and examined by your caregiver to determine if your sinusitis is related to an allergy. TREATMENT  Most cases of acute sinusitis are related to a viral infection and will resolve on their own within 10 days. Sometimes medicines are prescribed to help relieve symptoms (pain medicine, decongestants, nasal steroid sprays, or saline sprays).  However, for sinusitis related to a bacterial infection, your caregiver will prescribe antibiotic medicines. These are medicines that will help kill the bacteria causing the infection.  Rarely, sinusitis is caused by a fungal infection. In theses cases, your caregiver will prescribe antifungal medicine. For some cases of chronic sinusitis, surgery is needed. Generally, these are cases in which sinusitis recurs more than 3 times per year, despite other treatments. HOME CARE INSTRUCTIONS   Drink plenty of  water. Water helps thin the mucus so your sinuses can drain more easily.  Use a humidifier.  Inhale steam 3 to 4 times a day (for example, sit in the bathroom with the shower running).  Apply a warm, moist washcloth to your face 3 to 4 times a day, or as directed by your caregiver.  Use saline nasal sprays to help moisten and clean your sinuses.  Take over-the-counter or prescription medicines for pain, discomfort, or fever only as directed by your caregiver. SEEK IMMEDIATE MEDICAL CARE IF:  You have increasing pain or severe headaches.  You have nausea, vomiting, or drowsiness.  You have swelling around your face.  You have vision problems.  You have a stiff neck.  You have difficulty breathing. MAKE SURE YOU:   Understand these instructions.  Will watch your condition.  Will get help right away if you are not doing well or get worse. Document Released: 12/02/2005 Document Revised: 02/24/2012 Document Reviewed: 12/17/2011 [facility] Patient Information 2013 [place], [place].

## 2012-12-24 NOTE — ED Notes (Signed)
Per EMS: pt from home c/o URI with cough and being out of asthma meds x 1 day; pt seen multiple times in past for same

## 2012-12-25 NOTE — ED Provider Notes (Signed)
Medical screening examination/treatment/procedure(s) were performed by non-physician practitioner and as supervising physician I was immediately available for consultation/collaboration.   Carleene Cooper III, MD 12/25/12 [phone]

## 2012-12-28 ENCOUNTER — Encounter: Payer: Self-pay | Admitting: Internal Medicine

## 2012-12-28 ENCOUNTER — Encounter: Payer: Medicaid Other | Admitting: Internal Medicine

## 2012-12-30 ENCOUNTER — Ambulatory Visit: Payer: Self-pay | Admitting: Internal Medicine

## 2012-12-30 ENCOUNTER — Ambulatory Visit: Payer: Medicaid Other | Admitting: Internal Medicine

## 2012-12-31 ENCOUNTER — Telehealth: Payer: Self-pay | Admitting: Cardiology

## 2012-12-31 ENCOUNTER — Ambulatory Visit: Payer: Medicaid Other | Admitting: Family Medicine

## 2012-12-31 NOTE — Telephone Encounter (Signed)
Returned call to [name]. She stated her heart is beating "funny". Also has mild chest pain and shortness of breath. She feels very anxious all over. Reports compliance with metoprolol and flecainide. Was unable to go to appointment with Dr. Johney Frame yesterday due to lack of transportation. If her symptoms continue or worsen I advised her to seek medical attention. I also advised her to schedule a new appointment with Dr. Johney Frame. She stated understanding.  [place], PA-C 12/31/2012 6:44 PM

## 2013-01-01 ENCOUNTER — Encounter: Payer: Self-pay | Admitting: Internal Medicine

## 2013-01-04 ENCOUNTER — Encounter: Payer: Self-pay | Admitting: Nurse Practitioner

## 2013-01-04 ENCOUNTER — Telehealth: Payer: Self-pay | Admitting: *Deleted

## 2013-01-04 ENCOUNTER — Ambulatory Visit (INDEPENDENT_AMBULATORY_CARE_PROVIDER_SITE_OTHER): Payer: Medicaid Other | Admitting: Nurse Practitioner

## 2013-01-04 VITALS — BP 110/70 | HR 94 | Ht 68.0 in | Wt 224.8 lb

## 2013-01-04 DIAGNOSIS — I4729 Other ventricular tachycardia: Secondary | ICD-10-CM

## 2013-01-04 DIAGNOSIS — I472 Ventricular tachycardia, unspecified: Secondary | ICD-10-CM

## 2013-01-04 LAB — CBC WITH DIFFERENTIAL/PLATELET
Basophils Absolute: 0 10*3/uL (ref 0.0–0.1)
Basophils Relative: 0.6 % (ref 0.0–3.0)
Eosinophils Absolute: 0.3 10*3/uL (ref 0.0–0.7)
Eosinophils Relative: 3.8 % (ref 0.0–5.0)
HCT: 39 % (ref 36.0–46.0)
Hemoglobin: 13 g/dL (ref 12.0–15.0)
Lymphocytes Relative: 33.4 % (ref 12.0–46.0)
Lymphs Abs: 2.6 10*3/uL (ref 0.7–4.0)
MCHC: 33.4 g/dL (ref 30.0–36.0)
MCV: 83 fl (ref 78.0–100.0)
Monocytes Absolute: 0.8 10*3/uL (ref 0.1–1.0)
Monocytes Relative: 9.5 % (ref 3.0–12.0)
Neutro Abs: 4.2 10*3/uL (ref 1.4–7.7)
Neutrophils Relative %: 52.7 % (ref 43.0–77.0)
Platelets: 289 10*3/uL (ref 150.0–400.0)
RBC: 4.7 Mil/uL (ref 3.87–5.11)
RDW: 13.3 % (ref 11.5–14.6)
WBC: 7.9 10*3/uL (ref 4.5–10.5)

## 2013-01-04 MED ORDER — METOPROLOL SUCCINATE ER 50 MG PO TB24: 50.0000 mg | ORAL_TABLET | Freq: Two times a day (BID) | ORAL | Status: DC

## 2013-01-04 NOTE — Telephone Encounter (Signed)
Pt stated her heart feels like it is fluttering again, please advise.  Caralee Ates, CMA

## 2013-01-04 NOTE — Progress Notes (Signed)
[name] Date of Birth: January 26, 1956 Medical Record #[id number]  History of Present Illness: [name] is seen back today for a work in visit. She is seen for Dr. Johney Frame. She has diastolic heart failure, obesity, PVC's with runs of NSVT, obesity, hypothyroidism, HLD, anxiety and COPD. Has missed or cancelled numerous appointments.   Last admitted in December with diastolic heart failure. Had been out of her Flecainide and Metoprolol one week prior to that admission. Was diuresed. Put back on her medicines and referred for ablation - however, she has not shown back for any appointments.   She called today. Complaining of shortness of breath and heart racing and referred back here.   She comes in today. She is here with a friend. She is complaining more of the palpitations. This seems to exacerbate her breathing. Feels her heart racing and skipping "all the time". Using a little caffeine and a little salt. Weight is only up 2 pounds. Some chronic swelling but not worse. Little cough. Says she is ready to proceed on with the ablation. Has been missing appointments due to no money for gas. She is disabled. Says she has not ran out of her medicines and has been taking as prescribed.   Current Outpatient Prescriptions on File Prior to Visit  Medication Sig Dispense Refill  . flecainide (TAMBOCOR) 100 MG tablet Take 1 tablet (100 mg total) by mouth every 12 (twelve) hours.  30 tablet  6  . furosemide (LASIX) 20 MG tablet Take 1 tablet (20 mg total) by mouth daily.  30 tablet  6  . lisinopril (PRINIVIL,ZESTRIL) 2.5 MG tablet Take 1 tablet (2.5 mg total) by mouth daily.  30 tablet  6  . albuterol (PROVENTIL HFA;VENTOLIN HFA) 108 (90 BASE) MCG/ACT inhaler Inhale 2 puffs into the lungs every 2 (two) hours as needed for wheezing or shortness of breath (cough).  1 Inhaler  5  . atorvastatin (LIPITOR) 10 MG tablet Take 10 mg by mouth daily.      . fluticasone (FLONASE) 50 MCG/ACT nasal spray Place 2  sprays into the nose daily.  16 g  2    Allergies  Allergen Reactions  . Penicillins Rash    Past Medical History  Diagnosis Date  . Chest pain     a. s/p reported MI in 1997;  b. 09/2012 Cath: nl cors, nl LV ([place]);  c. 05/2012 Non-ischemic myoview.  . Hypertension   . Arthritis   . Problem with literacy   . Shortness of breath   . Anxiety   . Bronchitis   . Chronic headaches   . Obesity   . COPD (chronic obstructive pulmonary disease)   . Chronic diastolic CHF (congestive heart failure)     a. 12/2011 Echo: nl ef, Gr 1 DD;  b. 11/2012 Echo: EF 45-50%, no significant valvular abnormalities.  Marland Kitchen GERD (gastroesophageal reflux disease)     Past Surgical History  Procedure Date  . Ectopic pregnancy surgery     right    History  Smoking status  . Former Smoker -- 1.0 packs/day for 3 years  . Quit date: 12/03/2010  Smokeless tobacco  . Not on file    History  Alcohol Use No    Family History  Problem Relation Age of Onset  . Cancer Mother 12    lung ca  . Diabetes Mother   . Anxiety disorder Sister   . Cancer Brother 60    lung    Review of Systems:  The review of systems is per the HPI.  All other systems were reviewed and are negative.  Physical Exam: BP 110/70  Pulse 94  Ht 5\' 8"  (1.727 m)  Wt 224 lb 12.8 oz (101.969 kg)  BMI 34.18 kg/m2 Patient is alert and in no acute distress. She is obese. Skin is warm and dry. Color is normal.  HEENT is unremarkable. Normocephalic/atraumatic. PERRL. Sclera are nonicteric. Neck is supple. No masses. No JVD. Lungs are clear. Cardiac exam shows a regular rate and rhythm. She has frequent ectopics. Abdomen is soft. Extremities are full and fleshy and with only trace edema. Gait and ROM are intact. No gross neurologic deficits noted.  LABORATORY DATA: EKG shows sinus rhythm with frequent PVC's.   Lab Results  Component Value Date   WBC 7.7 11/30/2012   HGB 12.8 11/30/2012   HCT 38.9 11/30/2012   PLT 280 11/30/2012     GLUCOSE 114* 12/02/2012   CHOL 153 10/05/2012   TRIG 126 10/05/2012   HDL 39* 10/05/2012   LDLDIRECT 95 06/02/2012   LDLCALC 89 10/05/2012   ALT 12 10/05/2012   AST 18 10/05/2012   NA 133* 12/02/2012   K 4.2 12/02/2012   CL 94* 12/02/2012   CREATININE 1.14* 12/02/2012   BUN 28* 12/02/2012   CO2 25 12/02/2012   TSH 0.044* 11/30/2012   INR 1.04 10/05/2012   HGBA1C 5.9* 10/05/2012   Echo Study Conclusions from December 2013  Left ventricle: The cavity size was normal. Wall thickness was normal. Systolic function was mildly reduced. The estimated ejection fraction was in the range of 45% to 50%.   Assessment / Plan:  1. Palpitations/dyspnea with known PVC's and NSVT - ablation has been recommended. The patient has not been compliant with follow up appointments.She says she is now ready to proceed. Will get her back to see Dr. Johney Frame next week. Appointment has been arranged and she says she will come. Otherwise, I don't think we have much left to offer her.  I have increased her Toprol to BID in the interim. I told her that hopefully after the ablation she would be able to stop some of her medicines.   2. Diastolic heart failure - weight is only up 2 pounds. Not in distress and does not appear volume overloaded at this time. I have left her on her current regimen of Lasix. She seems more bothered by the PVC's than anything.   3. Hypothyroidism - her medicine was cut back due to the last TSH of 0.044. Will need a recheck today and adjust as needed.   Patient is agreeable to this plan and will call if any problems develop in the interim.

## 2013-01-04 NOTE — Patient Instructions (Signed)
We need to check labs today  Avoid caffeine and salt  Increase your Metoprolol to two times a day  See Dr. Johney Frame next Thursday at 9:45 am to discuss the ablation  Call the [facility] office at [phone] if you have any questions, problems or concerns.

## 2013-01-04 NOTE — Telephone Encounter (Signed)
Spoke with patient and let her know that she needed to try and be more compliant with her office visits.  She is c/o SOB and heart racing which are the reasons of which we were going to see her back following her monitor in 09/2012. Unfortunately she has not kept her appointments.  She sounds SOB over the phone, I have asked her if she feels she needs to go to the ER.  I let her know we can see her at 3pm here in the office.  She stated she does not have the money to get here. I then asked how she was going to get the the hospital if she went and she said  "call ambulance".  I have scheduled her for 3pm here with Sunday Spillers and stressed the importance of her keeping the appointment.  She was speaking with a family member who says they will get her here.

## 2013-01-05 ENCOUNTER — Telehealth: Payer: Self-pay | Admitting: *Deleted

## 2013-01-05 DIAGNOSIS — E039 Hypothyroidism, unspecified: Secondary | ICD-10-CM

## 2013-01-05 LAB — BASIC METABOLIC PANEL
BUN: 21 mg/dL (ref 6–23)
CO2: 27 mEq/L (ref 19–32)
Calcium: 8.7 mg/dL (ref 8.4–10.5)
Chloride: 102 mEq/L (ref 96–112)
Creatinine, Ser: 0.9 mg/dL (ref 0.4–1.2)
GFR: 73.55 mL/min (ref >60.00)
Glucose, Bld: 113 mg/dL — ABNORMAL HIGH (ref 70–99)
Potassium: 4 mEq/L (ref 3.5–5.1)
Sodium: 135 mEq/L (ref 135–145)

## 2013-01-05 LAB — TSH: TSH: 0.03 u[IU]/mL — ABNORMAL LOW (ref 0.35–5.50)

## 2013-01-05 MED ORDER — LEVOTHYROXINE SODIUM 100 MCG PO TABS: 100.0000 ug | ORAL_TABLET | Freq: Every day | ORAL | Status: DC

## 2013-01-05 NOTE — Telephone Encounter (Signed)
Patient is taking thyroid medications and not sure what dose.  Discussed with Lawson Fiscal NP and will just have patient start on Synthroid 100 mcg, Rx sent to pharmacy.  Scheduled follow up labs for 6 weeks.

## 2013-01-05 NOTE — Telephone Encounter (Signed)
Message copied by Burnell Blanks on Tue Jan 05, 2013  3:42 PM ------      Message from: Rosalio Macadamia      Created: Tue Jan 05, 2013 12:33 PM       Ok to report. Labs are ok except for TSH. I am pretty sure she is on replacement therapy. Need to verify the medicine/dose and then decrease this dose.

## 2013-01-11 ENCOUNTER — Encounter (HOSPITAL_COMMUNITY): Payer: Self-pay

## 2013-01-11 ENCOUNTER — Inpatient Hospital Stay (HOSPITAL_COMMUNITY)
Admission: EM | Admit: 2013-01-11 | Discharge: 2013-01-12 | DRG: 312 | Disposition: A | Discharge status: Home / Self Care | Payer: Medicaid Other | Attending: Family Medicine | Admitting: Family Medicine

## 2013-01-11 ENCOUNTER — Telehealth: Payer: Self-pay | Admitting: Family Medicine

## 2013-01-11 ENCOUNTER — Emergency Department (HOSPITAL_COMMUNITY): Payer: Medicaid Other

## 2013-01-11 DIAGNOSIS — T463X5A Adverse effect of coronary vasodilators, initial encounter: Secondary | ICD-10-CM | POA: Diagnosis present

## 2013-01-11 DIAGNOSIS — E039 Hypothyroidism, unspecified: Secondary | ICD-10-CM

## 2013-01-11 DIAGNOSIS — K219 Gastro-esophageal reflux disease without esophagitis: Secondary | ICD-10-CM | POA: Diagnosis present

## 2013-01-11 DIAGNOSIS — E669 Obesity, unspecified: Secondary | ICD-10-CM | POA: Diagnosis present

## 2013-01-11 DIAGNOSIS — I509 Heart failure, unspecified: Secondary | ICD-10-CM | POA: Diagnosis present

## 2013-01-11 DIAGNOSIS — I4729 Other ventricular tachycardia: Secondary | ICD-10-CM | POA: Diagnosis present

## 2013-01-11 DIAGNOSIS — R079 Chest pain, unspecified: Secondary | ICD-10-CM | POA: Diagnosis present

## 2013-01-11 DIAGNOSIS — T465X5A Adverse effect of other antihypertensive drugs, initial encounter: Secondary | ICD-10-CM | POA: Diagnosis present

## 2013-01-11 DIAGNOSIS — I472 Ventricular tachycardia, unspecified: Secondary | ICD-10-CM | POA: Diagnosis present

## 2013-01-11 DIAGNOSIS — F411 Generalized anxiety disorder: Secondary | ICD-10-CM | POA: Diagnosis present

## 2013-01-11 DIAGNOSIS — R0789 Other chest pain: Secondary | ICD-10-CM | POA: Diagnosis present

## 2013-01-11 DIAGNOSIS — J449 Chronic obstructive pulmonary disease, unspecified: Secondary | ICD-10-CM | POA: Diagnosis present

## 2013-01-11 DIAGNOSIS — I10 Essential (primary) hypertension: Secondary | ICD-10-CM | POA: Diagnosis present

## 2013-01-11 DIAGNOSIS — E559 Vitamin D deficiency, unspecified: Secondary | ICD-10-CM | POA: Diagnosis present

## 2013-01-11 DIAGNOSIS — M129 Arthropathy, unspecified: Secondary | ICD-10-CM | POA: Diagnosis present

## 2013-01-11 DIAGNOSIS — Z87891 Personal history of nicotine dependence: Secondary | ICD-10-CM

## 2013-01-11 DIAGNOSIS — Z880 Allergy status to penicillin: Secondary | ICD-10-CM

## 2013-01-11 DIAGNOSIS — I5032 Chronic diastolic (congestive) heart failure: Secondary | ICD-10-CM | POA: Diagnosis present

## 2013-01-11 DIAGNOSIS — Z6834 Body mass index (BMI) 34.0-34.9, adult: Secondary | ICD-10-CM

## 2013-01-11 DIAGNOSIS — Z79899 Other long term (current) drug therapy: Secondary | ICD-10-CM

## 2013-01-11 DIAGNOSIS — J4489 Other specified chronic obstructive pulmonary disease: Secondary | ICD-10-CM | POA: Diagnosis present

## 2013-01-11 DIAGNOSIS — I9589 Other hypotension: Principal | ICD-10-CM | POA: Diagnosis present

## 2013-01-11 DIAGNOSIS — I252 Old myocardial infarction: Secondary | ICD-10-CM

## 2013-01-11 DIAGNOSIS — E785 Hyperlipidemia, unspecified: Secondary | ICD-10-CM | POA: Diagnosis present

## 2013-01-11 DIAGNOSIS — T40605A Adverse effect of unspecified narcotics, initial encounter: Secondary | ICD-10-CM | POA: Diagnosis present

## 2013-01-11 DIAGNOSIS — I498 Other specified cardiac arrhythmias: Secondary | ICD-10-CM | POA: Diagnosis present

## 2013-01-11 DIAGNOSIS — I998 Other disorder of circulatory system: Secondary | ICD-10-CM

## 2013-01-11 HISTORY — DX: Hypothyroidism, unspecified: E03.9

## 2013-01-11 HISTORY — DX: Unspecified asthma, uncomplicated: J45.909

## 2013-01-11 LAB — TROPONIN I
Troponin I: <0.3 ng/mL (ref <0.30)
Troponin I: <0.3 ng/mL (ref <0.30)

## 2013-01-11 LAB — CBC WITH DIFFERENTIAL/PLATELET
Basophils Absolute: 0 10*3/uL (ref 0.0–0.1)
Eosinophils Relative: 3 % (ref 0–5)
HCT: 40 % (ref 36.0–46.0)
Hemoglobin: 13.7 g/dL (ref 12.0–15.0)
Lymphocytes Relative: 22 % (ref 12–46)
MCHC: 34.3 g/dL (ref 30.0–36.0)
MCV: 83 fL (ref 78.0–100.0)
Monocytes Absolute: 0.7 10*3/uL (ref 0.1–1.0)
Monocytes Relative: 7 % (ref 3–12)
Neutro Abs: 7.5 10*3/uL (ref 1.7–7.7)
RDW: 13.1 % (ref 11.5–15.5)

## 2013-01-11 LAB — COMPREHENSIVE METABOLIC PANEL
BUN: 9 mg/dL (ref 6–23)
CO2: 23 mEq/L (ref 19–32)
Calcium: 9.4 mg/dL (ref 8.4–10.5)
Chloride: 100 mEq/L (ref 96–112)
Creatinine, Ser: 0.65 mg/dL (ref 0.50–1.10)
GFR calc Af Amer: >90 mL/min (ref >90)
GFR calc non Af Amer: >90 mL/min (ref >90)
Total Bilirubin: 0.5 mg/dL (ref 0.3–1.2)

## 2013-01-11 LAB — PRO B NATRIURETIC PEPTIDE: Pro B Natriuretic peptide (BNP): 535.8 pg/mL — ABNORMAL HIGH (ref 0–125)

## 2013-01-11 MED ORDER — MORPHINE SULFATE 4 MG/ML IJ SOLN
4.0000 mg | Freq: Once | INTRAMUSCULAR | Status: AC
  Administered 2013-01-11: 4 mg via INTRAVENOUS
  Filled 2013-01-11: qty 1

## 2013-01-11 MED ORDER — ONDANSETRON HCL 4 MG/2ML IJ SOLN
4.0000 mg | Freq: Once | INTRAMUSCULAR | Status: AC
  Administered 2013-01-11: 4 mg via INTRAVENOUS
  Filled 2013-01-11: qty 2

## 2013-01-11 NOTE — ED Notes (Signed)
Applied oxygen 2l. [place]. sats 100%

## 2013-01-11 NOTE — Telephone Encounter (Signed)
Patient wants to speak to the nurse about a strange symptom that has been going on for about an hour.  She is shaking all over.

## 2013-01-11 NOTE — Telephone Encounter (Signed)
Patient states she is shaking all over. Feels nervous inside . She was just sitting and about an hour ago and started shaking.  No fever or chills she states. Advised to go to Urgent Care for evaluation

## 2013-01-11 NOTE — ED Notes (Signed)
Chest pain began about 2 hours ago  Pt. Having sob denies any Nausea .Marland Kitchen   Skin is w/d,. resp e/u Pt. Received 2 nitro en-route  Took the pain from a 10 to an 8,  Sinus bradycardia

## 2013-01-11 NOTE — ED Notes (Signed)
Patient ambulated with stand by assist,  She reports dizziness,  Denies any chest pain.  Patient pulse rate continued to be irregular,  Pulse ox on room air 100 to 95 percent.

## 2013-01-11 NOTE — ED Notes (Signed)
Patient is resting.  States she is feeling better,  No chest pain at this time.  Friend remains at bedside.  Awaiting cards consult

## 2013-01-11 NOTE — ED Notes (Signed)
Patient is alert and oriented.  She continues to have weakness/dizziness and complaints of chest pain.  She has noted frequent pvc on monitor.  Patient with no s/sx of shortness of breath.  She is pale in color.  Patient states she has had no urinary complaints.  Reports normal bm on yesterday.  Patient has swelling to lower extremities, non pitting.  Patient awaiting cardiology consult at this time

## 2013-01-11 NOTE — ED Provider Notes (Signed)
History  This chart was scribed for Holly Octave, MD by Bennett Scrape, ED Scribe. This patient was seen in room A10C/A10C and the patient's care was started at 5:53 PM.  CSN: [id number]  Arrival date & time 01/11/13  1745   First MD Initiated Contact with Patient 01/11/13 1753      Chief Complaint  Patient presents with  . Chest Pain     The history is provided by the patient. No language interpreter was used.    [name] is a 57 y.o. female brought in by ambulance, who presents to the Emergency Department complaining of intermittent sternally located CP with associated SOB that started this morning while sitting down that has been constant for the past 2 hours. Per EMS, pt was sinus bradycardia of 40s and 50s en route. She received 2 nitro en route with improvement in pain from a 10 out of 10 to an 8 out of 10. She reports one prior MI in 1996 and states that the symptoms are similar. She denies having any prior stress test or cardiac catheterizations. She reports a recent cough but denies nausea, emesis, leg swelling, diaphoresis, abdominal pain and back pain as associated symptoms. She has a h/o HTN. GERD and COPD. She is a former smoker but denies alcohol use.  Dr. Hayden Pedro is Cardiologist. Dr. Earnest Bailey is PCP.  Past Medical History  Diagnosis Date  . Chest pain     a. s/p reported MI in 1997;  b. 09/2012 Cath: nl cors, nl LV ([place]);  c. 05/2012 Non-ischemic myoview.  . Hypertension   . Arthritis   . Problem with literacy   . Shortness of breath   . Anxiety   . Bronchitis   . Chronic headaches   . Obesity   . COPD (chronic obstructive pulmonary disease)   . Chronic diastolic CHF (congestive heart failure)     a. 12/2011 Echo: nl ef, Gr 1 DD;  b. 11/2012 Echo: EF 45-50%, no significant valvular abnormalities.  Marland Kitchen GERD (gastroesophageal reflux disease)     Past Surgical History  Procedure Date  . Ectopic pregnancy surgery     right    Family History    Problem Relation Age of Onset  . Cancer Mother 76    lung ca  . Diabetes Mother   . Anxiety disorder Sister   . Cancer Brother 60    lung    History  Substance Use Topics  . Smoking status: Former Smoker -- 1.0 packs/day for 3 years    Quit date: 12/03/2010  . Smokeless tobacco: Not on file  . Alcohol Use: No    No OB history provided.  Review of Systems  A complete 10 system review of systems was obtained and all systems are negative except as noted in the HPI and PMH.   Allergies  Penicillins  Home Medications   Current Outpatient Rx  Name  Route  Sig  Dispense  Refill  . ALBUTEROL SULFATE HFA 108 (90 BASE) MCG/ACT IN AERS   Inhalation   Inhale 2 puffs into the lungs every 2 (two) hours as needed for wheezing or shortness of breath (cough).   1 Inhaler   5   . ATORVASTATIN CALCIUM 10 MG PO TABS   Oral   Take 10 mg by mouth daily.         Marland Kitchen FLECAINIDE ACETATE 100 MG PO TABS   Oral   Take 1 tablet (100 mg total) by mouth every 12 (twelve)  hours.   30 tablet   6   . FLUTICASONE PROPIONATE 50 MCG/ACT NA SUSP   Nasal   Place 2 sprays into the nose daily.   16 g   2   . FUROSEMIDE 20 MG PO TABS   Oral   Take 1 tablet (20 mg total) by mouth daily.   30 tablet   6   . LEVOTHYROXINE SODIUM 100 MCG PO TABS   Oral   Take 1 tablet (100 mcg total) by mouth daily.   30 tablet   3   . LISINOPRIL 2.5 MG PO TABS   Oral   Take 1 tablet (2.5 mg total) by mouth daily.   30 tablet   6   . METOPROLOL SUCCINATE ER 50 MG PO TB24   Oral   Take 1 tablet (50 mg total) by mouth 2 (two) times daily. Take with or immediately following a meal.   60 tablet   6     Triage Vitals: Pulse 52  Temp 98.8 F (37.1 C) (Oral)  Resp 15  SpO2 100%  Physical Exam  Nursing note and vitals reviewed. Constitutional: She is oriented to person, place, and time. She appears well-developed and well-nourished. No distress.  HENT:  Head: Normocephalic and atraumatic.   Mouth/Throat: Oropharynx is clear and moist.  Eyes: Conjunctivae normal and EOM are normal. Pupils are equal, round, and reactive to light.  Neck: Normal range of motion. Neck supple. No tracheal deviation present.  Cardiovascular: Normal rate.  An irregular rhythm present.  Pulmonary/Chest: Effort normal and breath sounds normal. No respiratory distress. She exhibits tenderness (left sided chest tenderness to palpation).       Dry cough on exam  Abdominal: Soft. There is no tenderness.  Musculoskeletal: Normal range of motion. She exhibits no edema.       No pedal edema  Neurological: She is alert and oriented to person, place, and time.  Skin: Skin is warm and dry.  Psychiatric: She has a normal mood and affect. Her behavior is normal.    ED Course  Procedures (including critical care time)  DIAGNOSTIC STUDIES: Oxygen Saturation is 100% on room air, normal by my interpretation.    COORDINATION OF CARE: 6:00 PM-Discussed treatment plan which includes CXR, CBC panel, troponin and pain control with pt at bedside and pt agreed to plan.   6:15 PM- Ordered 4 mg Zofran and 4 mg morphine injection  7:14 PM-Consult complete with Dr. Dietrich Pates, cardiologist. Patient case explained and discussed. Dr. Dietrich Pates advises the pt is stable for f/u as an outpatient. Call ended at 7:16 PM.  8:21 PM-Pt rechecked and feels improved. Discussed prior cardiac workup. Informed pt of consult with Dr. Dietrich Pates and plan to f/u as outpatient. Pt is agreeable to this plan.  10:50 PM- Pt rechecked and is resting comfortably. BP is 80/47.  11:05 PM-Pt rechecked and feels the same. BP is 90/32. Discussed admission for overnight observation for hypotension and pt agreed.  Labs Reviewed  CBC WITH DIFFERENTIAL - Abnormal; Notable for the following:    WBC 10.8 (*)     All other components within normal limits  COMPREHENSIVE METABOLIC PANEL - Abnormal; Notable for the following:    Glucose, Bld 102 (*)     All  other components within normal limits  PRO B NATRIURETIC PEPTIDE - Abnormal; Notable for the following:    Pro B Natriuretic peptide (BNP) 535.8 (*)     All other components within normal limits  TROPONIN  I   Dg Chest 2 View  01/11/2013  *RADIOLOGY REPORT*  Clinical Data: Centralized chest pain and weakness  CHEST - 2 VIEW  Comparison: 12/24/2012; 11/30/2012; 06/12/2012  Findings: Unchanged cardiac silhouette and mediastinal contours with calcified bilateral hilar lymph nodes, the sequela of prior granulomatous infection.  There is unchanged mild diffuse thickening of pulmonary interstitium.  Persistent mild elevation of the right hemidiaphragm.  No focal airspace opacity.  No pleural effusion or pneumothorax.  Unchanged bones.  IMPRESSION: 1.  No acute cardiopulmonary disease. 2.  Sequela of prior granulomatous infection as above.   Original Report Authenticated By: Tacey Ruiz, MD      No diagnosis found.    MDM  9 intermittent substernal chest pain for the past day, worse in the past 2 hours associated with shortness of breath. No nausea. Patient reports history of MI apparently had clean in October 2012 with a negative stress test in June 2013. Pain is reproducible to palpation  Pain is atypical for ACS. Patient has a questionable history of MI though no documentation seen. Her pain is reproducible to palpation. She is in bigeminy on her EKG without ST changes. It is negative. Discussed with Dr. Dietrich Pates who agrees patient stable for outpatient followup after delta troponin.  Patient has fluctuating blood pressures in the ED. They range from 70 systolic to 110 systolic. She denies any change in dizziness or lightheadedness. Her chest pain is resolved. Her chest pain is atypical for ACS or delta troponin are negative. She's given IV fluids with improvement in her blood pressure but then trended down again to the 80s. D/w [facility] residents for observation.   Date: 01/11/2013  Rate: 104   Rhythm: sinus tachycardia  QRS Axis: normal  Intervals: normal  ST/T Wave abnormalities: normal  Conduction Disutrbances:none  Narrative Interpretation: bigeminy  Old EKG Reviewed: unchanged   I personally performed the services described in this documentation, which was scribed in my presence. The recorded information has been reviewed and is accurate.    Holly Octave, MD 01/11/13 [phone]

## 2013-01-11 NOTE — ED Notes (Signed)
Patient up to bathroom,  Denies dizziness.  Patient returned to stretcher and monitor.  No chest pain.  Awaiting admitting MD

## 2013-01-12 ENCOUNTER — Encounter (HOSPITAL_COMMUNITY): Payer: Self-pay | Admitting: Family Medicine

## 2013-01-12 LAB — CBC
MCH: 27.8 pg (ref 26.0–34.0)
MCHC: 33.5 g/dL (ref 30.0–36.0)
MCV: 83.1 fL (ref 78.0–100.0)
Platelets: 212 10*3/uL (ref 150–400)
RDW: 13.3 % (ref 11.5–15.5)

## 2013-01-12 LAB — CREATININE, SERUM: Creatinine, Ser: 0.71 mg/dL (ref 0.50–1.10)

## 2013-01-12 MED ORDER — FLECAINIDE ACETATE 100 MG PO TABS
100.0000 mg | ORAL_TABLET | Freq: Two times a day (BID) | ORAL | Status: DC
Start: 2013-01-12 — End: 2013-01-12
  Filled 2013-01-12 (×2): qty 1

## 2013-01-12 MED ORDER — LEVOTHYROXINE SODIUM 100 MCG PO TABS
100.0000 ug | ORAL_TABLET | Freq: Every day | ORAL | Status: DC
  Administered 2013-01-12: 100 ug via ORAL
  Filled 2013-01-12 (×2): qty 1

## 2013-01-12 MED ORDER — HEPARIN SODIUM (PORCINE) 5000 UNIT/ML IJ SOLN
5000.0000 [IU] | Freq: Three times a day (TID) | INTRAMUSCULAR | Status: DC
  Administered 2013-01-12: 5000 [IU] via SUBCUTANEOUS
  Filled 2013-01-12 (×5): qty 1

## 2013-01-12 MED ORDER — ALBUTEROL SULFATE HFA 108 (90 BASE) MCG/ACT IN AERS: 2.0000 | INHALATION_SPRAY | RESPIRATORY_TRACT | Status: DC | PRN

## 2013-01-12 MED ORDER — SODIUM CHLORIDE 0.9 % IJ SOLN
3.0000 mL | Freq: Two times a day (BID) | INTRAMUSCULAR | Status: DC
  Administered 2013-01-12: 3 mL via INTRAVENOUS

## 2013-01-12 MED ORDER — METOPROLOL SUCCINATE ER 25 MG PO TB24
25.0000 mg | ORAL_TABLET | Freq: Every day | ORAL | Status: DC
  Filled 2013-01-12: qty 1

## 2013-01-12 MED ORDER — ALBUTEROL SULFATE HFA 108 (90 BASE) MCG/ACT IN AERS
2.0000 | INHALATION_SPRAY | RESPIRATORY_TRACT | Status: DC | PRN
  Filled 2013-01-12: qty 6.7

## 2013-01-12 MED ORDER — TIOTROPIUM BROMIDE MONOHYDRATE 18 MCG IN CAPS: 18.0000 ug | ORAL_CAPSULE | Freq: Every day | RESPIRATORY_TRACT | Status: DC

## 2013-01-12 MED ORDER — SODIUM CHLORIDE 0.9 % IV SOLN
INTRAVENOUS | Status: DC
  Administered 2013-01-12: 05:00:00 via INTRAVENOUS

## 2013-01-12 MED ORDER — METOPROLOL SUCCINATE ER 25 MG PO TB24: 25.0000 mg | ORAL_TABLET | Freq: Every day | ORAL | Status: DC

## 2013-01-12 NOTE — H&P (Signed)
Family Medicine Teaching [facility] Admission History and Physical  Patient name: NATHALYA WOLANSKI Medical record number: [id number] Date of birth: [birth year] Age: 57 y.o. Gender: female  Primary Care Provider: Delbert Harness, MD  Chief Complaint: Low blood pressure  History of Present Illness: JOPLIN CANTY is a 57 y.o. year old female with anxiety, GERD, COPD, CHF - EF 45-50, and atypical chest pain with a clean cath in 2012 presented to the ED tonight with substernal chest pain. It started suddenly this morning and was mildly improved with nitroglycerin en route. Later, she was given morphine, which relieved the pain. In the ED, an EKG demonstrated a ventricular bigeminy, chest X-ray was negative for acute findings, and trop I was negative x 2. Dr. Manus Gunning, ED Physician, discussed the case with the cardiologist, Dr. Dietrich Pates, who recommended outpatient cardiology follow up at [facility], where she is regularly evaluated. However, after the decision was made that she did not need further evaluation from a chest pain standpoint, her blood pressures became labile. Her systolic BP went into the 70;s, so she was given IV fluids with little improvement in her blood pressure. Currently, Ms. Chaloux denies any chest pain, SOB or lightheadedness. She notes that she took all of her blood pressure medications today and doesn't remember every having problems with lower blood pressure.    Patient Active Problem List  Diagnosis  . Obesity  . Palpitations  . History of myocardial infarction  . Hypothyroidism  . Hyperlipidemia LDL goal < 100  . Arm pain  . Back pain  . Tremor  . Insomnia  . Dyspnea  . Chest pain  . Anxiety  . Dental caries  . Myalgia  . Vitamin d deficiency  . COPD (chronic obstructive pulmonary disease)  . Patellofemoral disorder of right knee  . Chest pain  . Abnormal CT scan, bladder  . Premature ventricular contraction  . Substernal chest pain  . Acute CHF  . Acute  diastolic CHF (congestive heart failure)   Past Medical History: Past Medical History  Diagnosis Date  . Chest pain     a. s/p reported MI in 1997;  b. 09/2012 Cath: nl cors, nl LV ([place]);  c. 05/2012 Non-ischemic myoview.  . Hypertension   . Arthritis   . Problem with literacy   . Shortness of breath   . Anxiety   . Bronchitis   . Chronic headaches   . Obesity   . COPD (chronic obstructive pulmonary disease)   . Chronic diastolic CHF (congestive heart failure)     a. 12/2011 Echo: nl ef, Gr 1 DD;  b. 11/2012 Echo: EF 45-50%, no significant valvular abnormalities.  Marland Kitchen GERD (gastroesophageal reflux disease)     Past Surgical History: Past Surgical History  Procedure Date  . Ectopic pregnancy surgery     right  . Nm myoview ltd June 2013    no reversible ischemia  . Cardiac catheterization October 2012    No coronary artery disease    Social History: History   Social History  . Marital Status: Legally Separated    Spouse Name: N/A    Number of Children: N/A  . Years of Education: N/A   Social History Main Topics  . Smoking status: Former Smoker -- 1.0 packs/day for 3 years    Quit date: 12/03/2010  . Smokeless tobacco: None  . Alcohol Use: No  . Drug Use: No  . Sexually Active: No   Other Topics Concern  . None  Social History Narrative   Lives with a female friend, just roommates, separated.  Has 4 kids, all grown.  Finished 10th grade.  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  Lives in [place]    Family History: Family History  Problem Relation Age of Onset  . Cancer Mother 47    lung ca  . Diabetes Mother   . Anxiety disorder Sister   . Cancer Brother 60    lung    Allergies: Allergies  Allergen Reactions  . Penicillins Rash    No current facility-administered medications for this encounter.   Current Outpatient Prescriptions  Medication Sig Dispense Refill  . albuterol (PROVENTIL HFA;VENTOLIN HFA) 108 (90 BASE) MCG/ACT inhaler  Inhale 2 puffs into the lungs every 2 (two) hours as needed for wheezing or shortness of breath (cough).  1 Inhaler  5  . atorvastatin (LIPITOR) 10 MG tablet Take 10 mg by mouth daily.      . flecainide (TAMBOCOR) 100 MG tablet Take 1 tablet (100 mg total) by mouth every 12 (twelve) hours.  30 tablet  6  . furosemide (LASIX) 20 MG tablet Take 1 tablet (20 mg total) by mouth daily.  30 tablet  6  . levothyroxine (SYNTHROID) 100 MCG tablet Take 1 tablet (100 mcg total) by mouth daily.  30 tablet  3  . lisinopril (PRINIVIL,ZESTRIL) 2.5 MG tablet Take 1 tablet (2.5 mg total) by mouth daily.  30 tablet  6  . metoprolol succinate (TOPROL-XL) 50 MG 24 hr tablet Take 1 tablet (50 mg total) by mouth 2 (two) times daily. Take with or immediately following a meal.  60 tablet  6   Review Of Systems: Per HPI with the following additions: no Otherwise 12 point review of systems was performed and was unremarkable.  Physical Exam: Temp:  [98.4 F (36.9 C)-98.8 F (37.1 C)] 98.4 F (36.9 C) (01/27 2034) Pulse Rate:  [41-86] 70  (01/28 0100) Resp:  [10-26] 16  (01/28 0100) BP: (77-121)/(32-84) 112/49 mmHg (01/28 0100) SpO2:  [98 %-100 %] 100 % (01/28 0100)   General: middle aged female, non ill appearing, non distressed HEENT: PERRLA, extra ocular movement intact, sclera clear, anicteric and oropharynx clear, no lesions Heart: regularly irregular rhythm Lungs: unlabored breathing and rales in lower lobes bilaterally, resonant to percussion  Abdomen: abdomen is soft without significant tenderness, masses, organomegaly or guarding Extremities: extremities normal, atraumatic, no cyanosis or edema Skin:no rashes Neurology: normal without focal findings, mental status, speech normal, alert and oriented x3 and PERLA  Labs and Imaging:  Results for orders placed during the hospital encounter of 01/11/13 (from the past 24 hour(s))  CBC WITH DIFFERENTIAL     Status: Abnormal   Collection Time   01/11/13   6:07 PM      Component Value Range   WBC 10.8 (*) 4.0 - 10.5 K/uL   RBC 4.82  3.87 - 5.11 MIL/uL   Hemoglobin 13.7  12.0 - 15.0 g/dL   HCT 96.0  45.4 - 09.8 %   MCV 83.0  78.0 - 100.0 fL   MCH 28.4  26.0 - 34.0 pg   MCHC 34.3  30.0 - 36.0 g/dL   RDW 11.9  14.7 - 82.9 %   Platelets 238  150 - 400 K/uL   Neutrophils Relative 69  43 - 77 %   Neutro Abs 7.5  1.7 - 7.7 K/uL   Lymphocytes Relative 22  12 - 46 %   Lymphs Abs 2.3  0.7 - 4.0 K/uL   Monocytes Relative 7  3 - 12 %   Monocytes Absolute 0.7  0.1 - 1.0 K/uL   Eosinophils Relative 3  0 - 5 %   Eosinophils Absolute 0.3  0.0 - 0.7 K/uL   Basophils Relative 0  0 - 1 %   Basophils Absolute 0.0  0.0 - 0.1 K/uL  COMPREHENSIVE METABOLIC PANEL     Status: Abnormal   Collection Time   01/11/13  6:07 PM      Component Value Range   Sodium 136  135 - 145 mEq/L   Potassium 4.2  3.5 - 5.1 mEq/L   Chloride 100  96 - 112 mEq/L   CO2 23  19 - 32 mEq/L   Glucose, Bld 102 (*) 70 - 99 mg/dL   BUN 9  6 - 23 mg/dL   Creatinine, Ser 1.61  0.50 - 1.10 mg/dL   Calcium 9.4  8.4 - 09.6 mg/dL   Total Protein 7.1  6.0 - 8.3 g/dL   Albumin 3.8  3.5 - 5.2 g/dL   AST 24  0 - 37 U/L   ALT 18  0 - 35 U/L   Alkaline Phosphatase 90  39 - 117 U/L   Total Bilirubin 0.5  0.3 - 1.2 mg/dL   GFR calc non Af Amer >90  >90 mL/min   GFR calc Af Amer >90  >90 mL/min  TROPONIN I     Status: Normal   Collection Time   01/11/13  6:08 PM      Component Value Range   Troponin I <0.30  <0.30 ng/mL  PRO B NATRIURETIC PEPTIDE     Status: Abnormal   Collection Time   01/11/13  6:08 PM      Component Value Range   Pro B Natriuretic peptide (BNP) 535.8 (*) 0 - 125 pg/mL  TROPONIN I     Status: Normal   Collection Time   01/11/13  9:04 PM      Component Value Range   Troponin I <0.30  <0.30 ng/mL  D-DIMER, QUANTITATIVE     Status: Normal   Collection Time   01/11/13  9:31 PM      Component Value Range   D-Dimer, Quant 0.39  0.00 - 0.48 ug/mL-FEU    Dg Chest 2  View  01/11/2013  *RADIOLOGY REPORT*  Clinical Data: Centralized chest pain and weakness  CHEST - 2 VIEW  Comparison: 12/24/2012; 11/30/2012; 06/12/2012  Findings: Unchanged cardiac silhouette and mediastinal contours with calcified bilateral hilar lymph nodes, the sequela of prior granulomatous infection.  There is unchanged mild diffuse thickening of pulmonary interstitium.  Persistent mild elevation of the right hemidiaphragm.  No focal airspace opacity.  No pleural effusion or pneumothorax.  Unchanged bones.  IMPRESSION: 1.  No acute cardiopulmonary disease. 2.  Sequela of prior granulomatous infection as above.   Original Report Authenticated By: Tacey Ruiz, MD      Date: 01/12/2013  Rate: 104  Rhythm: ventricular bigeminy  QRS Axis: normal  Intervals: normal  ST/T Wave abnormalities: normal  Conduction Disutrbances:none  Narrative Interpretation:   Old EKG Reviewed: unchanged     Assessment and Plan: TAMIKKA PILGER is a 57 y.o. year old female presenting with chest pain that was atypical and resolved in the ED, but had persistently low BP needing further evaluation and observation.   # Low Blood Pressure - Likely secondary to being over medicated with HTN meds since Metoprolol XL 50  mg recently increased to BID on 01/04/13 to control NSVT, but may also be touch of dehdyration  - Hold ACE-I and Lasix  - Cont Metoprolol XL at 25 mg daily instead of regular dose of 50 mg BID - Give NS @ 100 mL/hr - Check orthostatics prior to d/c  # Chest Pain, Substernal - Resolved in ED with nitro and morphine; D-dimer WNL, trop neg, EKG showed no ischemic changes with known clean cath Oct 2012 and normal stress myoview June 2013; Etiologies include MSK, GERD or esophageal spasm, anxiety, pleurisy  - Continue to monitor on telemetry - Repeat EKG in AM - GI cocktail PRN  # Dyssrhythmia - Patient noted to have NSVT, last outpatient note from 01/04/13 plans for ablation but increased Toprol XL to  50mg  BID from daily  - Cont Flecainide 100 mg q 12 - Cont Metoprolol at 25 mg once daily today  # Hypothyroidism  - Cont home dose synthroid  # COPD - Patient notes use of albuterol 2-3 times per day;  - Consider starting Spiriva given frequency of symptoms - Cont Albuterol  FENGI - Regular diet PPX - Heparin SQ DISPO - Admit to [facility] Medicine Teaching Service   Si Raider. Clinton Sawyer, MD, MBA 01/12/2013, 2:43 AM Family Medicine Resident, PGY-2 [phone] pager

## 2013-01-12 NOTE — Progress Notes (Signed)
Family Medicine Teaching Service Daily Progress Note Service Page: [phone]  Subjective:  Feeling well this am. No chest pain.  Denies SOB. Eager to go home.  Objective: Temp:  [97.9 F (36.6 C)-98.8 F (37.1 C)] 97.9 F (36.6 C) (01/28 0357) Pulse Rate:  [41-86] 86  (01/28 0649) Resp:  [10-26] 18  (01/28 0357) BP: (77-131)/(32-84) 108/75 mmHg (01/28 0649) SpO2:  [98 %-100 %] 100 % (01/28 0357) Weight:  [226 lb 8 oz (102.74 kg)] 226 lb 8 oz (102.74 kg) (01/28 0402)  Exam: General: well appearing, resting comfortably in bed. Cardiovascular: regularly, irregular rhythm. No murmurs appreciated. Respiratory: Normal work of breathing. CTAB.   Abdomen: soft, nontender, nondistended. Extremities: warm, well perfused.  Neuro: No focal deficits.  CBC BMET   Lab 01/12/13 0540 01/11/13 1807  WBC 6.3 10.8*  HGB 12.0 13.7  HCT 35.8* 40.0  PLT 212 238    Lab 01/12/13 0540 01/11/13 1807  NA -- 136  K -- 4.2  CL -- 100  CO2 -- 23  BUN -- 9  CREATININE 0.71 0.65  GLUCOSE -- 102*  CALCIUM -- 9.4     Imaging/Diagnostic Tests:  Cardiac Panel (last 3 results)  Basename 01/11/13 2104 01/11/13 1808  CKTOTAL -- --  CKMB -- --  TROPONINI <0.30 <0.30  RELINDX -- --   Lab Results  Component Value Date   TSH 0.03* 01/04/2013   Dg Chest 2 View 01/11/2013 IMPRESSION: 1.  No acute cardiopulmonary disease. 2.  Sequela of prior granulomatous infection as above.  EKG: Sinus tachycardia with Ventricular Bigeminy; Follow up EKG 1/28 (430) - Sinus rhythm with ventricular bigeminy   Assessment/Plan: [name] is a 57 y.o. year old female with anxiety, GERD, COPD, CHF - EF 45-50%, and atypical chest pain with a clean cath in 2012 who presented to the ED with substernal chest pain.  Chest pain was atypical and resolved in the ED, but she had hypotension and persistently low BP needing further evaluation and observation.   # Chest pain, atypical  - EKG revealed no signs of ischemia,  Troponin negative x 2 - Chest pain resolved in the ED following morphine, and nitro (given en route) - No chest pain this am. - Plan to discharge home today with cardiology follow up  # Hypotension  - BP in the ED - 77/52 - Resolved following IV fluids - Likely secondary to nitro, morphine, and increase in Metoprolol XL to 50 mg  # Dyssrhythmia - Patient noted to have NSVT, last outpatient note from 01/04/13 plans for ablation but increased Toprol XL to 50mg  BID from daily  - Cont Flecainide 100 mg q 12  - Cont Metoprolol at 25 mg in the setting of recent hypotension  # Hypothyroidism  - TSH on 1/20 was suppressed at 0.03.  Synthroid  Was decreased at that time to 100 mcg daily. - Will continue Synthroid 100 mcg.  # COPD - Patient notes use of albuterol 2-3 times per day - Cont Albuterol  - Additional of Spiriva at D/C  FENGI - Regular diet  PPX - Heparin SQ  DISPO - Planned D/C today CODE - Full code  Everlene Other, DO 01/12/2013, 7:14 AM

## 2013-01-12 NOTE — Progress Notes (Signed)
Orthostatic vitals as follows: Laying HR 40 BP 16/50 (64)   Sitting HR 50 BP 93/56 (66)  Standing HR 93 BP 112/69 (78) Patient denies any dizziness or distress during these times. [name]

## 2013-01-12 NOTE — Care Management Note (Signed)
    Page 1 of 1   01/12/2013     3:09:25 PM   CARE MANAGEMENT NOTE 01/12/2013  Patient:  [name], [name]   Account Number:  [id number]  Date Initiated:  01/12/2013  Documentation initiated by:  Ayodele Sangalang  Subjective/Objective Assessment:   PT ADM ON 01/11/13 WITH CHEST PAIN.  PTA, PT INDEPENDENT, LIVES WITH ROOMMATE.     Action/Plan:   PT DENIES HOME NEEDS.  WILL FOLLOW.   Anticipated DC Date:  01/12/2013   Anticipated DC Plan:  HOME/SELF CARE      DC Planning Services  CM consult      Choice offered to / List presented to:             Status of service:  Completed, signed off Medicare Important Message given?   (If response is "NO", the following Medicare IM given date fields will be blank) Date Medicare IM given:   Date Additional Medicare IM given:    Discharge Disposition:  HOME/SELF CARE  Per UR Regulation:  Reviewed for med. necessity/level of care/duration of stay  If discussed at Long Length of Stay Meetings, dates discussed:    Comments:

## 2013-01-12 NOTE — H&P (Signed)
FMTS Attending Admit Note Patient seen and examined by me, reviewed Dr Yetta Numbers note and I agree with his assess/plan> Briefly, a 56yoF who presented to ED with chest pain, which resolved after administration of NTG.  Her cardiac workup history noted in resident H&P.  At the time of planned discharge from ED, patient became hypotensive and was admitted for observation.  Of note, she has had a negative D-dimer and denies chest pain or dyspnea.  This morning she reports feeling well and requesting to go home.  ECG done at 0430am this morning with continued bigeminy, unchanged from ED ECG done 01/27.  SBP in the 100-110 range this morning.  Assess/Plan: Patient admitted with hypotension after chest pain evaluation in ED (?NTG effects).  She appears to be clinically improved this morning.  Would hold her IVF and consider orthostatic blood pressures, walk her in room/hall with assistance before likely discharge this morning with outpatient cardiology visit as had been planned in the ED before her episodic hypotension.  Paula Compton, MD

## 2013-01-12 NOTE — Progress Notes (Signed)
PT BP 93/49 HR 40 via dianamap but 83 SR w/ PVCs via monitor. Will hold metoprolol and flecainide per Dr. Felipa Emory orders. [name]

## 2013-01-12 NOTE — Progress Notes (Signed)
FMTS Attending Daily Note:  Jeff Malania Gawthrop MD  [phone] pager  Family Practice pager:  [phone] I have discussed this patient with the resident Dr. Cook and attending physician Dr. Breen.  I agree with their findings, assessment, and care plan  

## 2013-01-12 NOTE — Discharge Summary (Signed)
Family Medicine Teaching [facility] Discharge Summary  Patient name: [name] Medical record number: [id number] Date of birth: [birth year] Age: 57 y.o. Gender: female Date of Admission: 01/11/2013  Date of Discharge: 01/12/13 Admitting Physician: Barbaraann Barthel, MD  Primary Care Provider: Delbert Harness, MD  Indication for Hospitalization: Chest pain, hypotension  Discharge Diagnoses:  Chest pain, atypical Hypotension History of Non-sustained Ventricular Tachycardia Hypothyroidism  COPD  Brief Hospital Course:  [name] is a 57 y.o. year old female with anxiety, GERD, COPD, CHF - EF 45-50%, and atypical chest pain with a clean cath in 2012 who presented to the ED with substernal chest pain. Chest pain was atypical and resolved in the ED, but she had hypotension and persistently low BP needing further observation.   1) Chest pain, atypical  - EKG revealed no signs of ischemia, Troponin negative x 2  - Chest pain resolved in the ED following morphine, and nitro (given en route)  - Patient discharged and will need close follow up with Cardiology, Dr. Johney Frame  2) Hypotension  - BP in the ED - 77/52  - Resolved following IV fluids  - Likely secondary to nitro, morphine, and recent increase in Metoprolol XL to 50 mg BID  3) History of Non-sustained Ventricular Tachycardia - Flecainide and Metoprolol held in the setting of hypotension - Will resume flecainide at discharge - Toprol-XL decreased to 25 mg daily  4) Hypothyroidism  - TSH on 1/20 was suppressed at 0.03. Synthroid was decreased at that time to 100 mcg daily.  - Synthroid 100 mcg was continued during admission  5) COPD - Albuterol was continued during admission - Patient reported frequent use of inhaler, so Spiriva was added at discharge.  Significant Labs and Imaging:   CBC BMET   Lab 01/12/13 0540 01/11/13 1807  WBC 6.3 10.8*  HGB 12.0 13.7  HCT 35.8* 40.0  PLT 212 238    Lab 01/12/13 0540  01/11/13 1807  NA -- 136  K -- 4.2  CL -- 100  CO2 -- 23  BUN -- 9  CREATININE 0.71 0.65  GLUCOSE -- 102*  CALCIUM -- 9.4     Cardiac Panel (last 3 results)  Basename 01/11/13 2104 01/11/13 1808  CKTOTAL -- --  CKMB -- --  TROPONINI <0.30 <0.30  RELINDX -- --   Lab Results  Component Value Date   TSH 0.03* 01/04/2013   EKG: Sinus tachycardia with Ventricular Bigeminy; Follow up EKG 1/28 - Sinus rhythm with ventricular bigeminy   Dg Chest 2 View 01/11/2013 IMPRESSION: 1.  No acute cardiopulmonary disease. 2.  Sequela of prior granulomatous infection as above.   Procedures: None  Consultations: None  Discharge Medications:    Medication List     As of 01/12/2013 12:01 PM    STOP taking these medications         lisinopril 2.5 MG tablet   Commonly known as: PRINIVIL,ZESTRIL      TAKE these medications         albuterol 108 (90 BASE) MCG/ACT inhaler   Commonly known as: PROVENTIL HFA;VENTOLIN HFA   Inhale 2 puffs into the lungs every 4 (four) hours as needed for wheezing or shortness of breath (cough).      atorvastatin 10 MG tablet   Commonly known as: LIPITOR   Take 10 mg by mouth daily.      flecainide 100 MG tablet   Commonly known as: TAMBOCOR   Take 1 tablet (100 mg  total) by mouth every 12 (twelve) hours.      furosemide 20 MG tablet   Commonly known as: LASIX   Take 1 tablet (20 mg total) by mouth daily.      levothyroxine 100 MCG tablet   Commonly known as: SYNTHROID, LEVOTHROID   Take 1 tablet (100 mcg total) by mouth daily.      metoprolol succinate 25 MG 24 hr tablet   Commonly known as: TOPROL-XL   Take 1 tablet (25 mg total) by mouth daily.      tiotropium 18 MCG inhalation capsule   Commonly known as: SPIRIVA   Place 1 capsule (18 mcg total) into inhaler and inhale daily.       Issues for Follow Up:  1) Continued resolution of chest pain 2) Titration and/or discontinuation of Toprol XL  3) Consider restarting Lisinopril 4) Assess  bronchodilator use  Outstanding Results: None  Discharge Instructions: Patient was counseled important signs and symptoms that should prompt return to medical care, changes in medications, dietary instructions, activity restrictions, and follow up appointments.      Follow-up Information    Follow up with Delbert Harness, MD. On 01/19/2013. (10 am)    Contact information:   [address] [place] [address] [phone]         Discharge Condition: Stable. Discharged home.  Everlene Other, DO 01/12/2013, 12:01 PM

## 2013-01-13 ENCOUNTER — Encounter: Payer: Self-pay | Admitting: Nurse Practitioner

## 2013-01-13 NOTE — Discharge Summary (Signed)
Family Medicine Teaching Service  Discharge Note : Attending Renold Don MD Pager [phone] Inpatient Team Pager:  [phone]  I have reviewed this patient and reviewed their chart and discussed discharge planning with the resident at the time of discharge. I agree with the discharge plan as above.

## 2013-01-14 ENCOUNTER — Ambulatory Visit: Payer: Medicaid Other | Admitting: Internal Medicine

## 2013-01-14 ENCOUNTER — Telehealth: Payer: Self-pay | Admitting: Internal Medicine

## 2013-01-14 NOTE — Telephone Encounter (Signed)
New problem   Patient calling   Status of upcoming procedure .  This was discuss during last office visit.

## 2013-01-15 ENCOUNTER — Telehealth: Payer: Self-pay | Admitting: Family Medicine

## 2013-01-15 NOTE — Telephone Encounter (Signed)
Pt is asking about this nasal spray - she would like to use it before the end of today and needs to know something.  Wants to know if another doctor can give advise

## 2013-01-15 NOTE — Telephone Encounter (Signed)
Spoke with patient and she tried to spell off the active ingredients as listed on bottle. She has a little trouble reading the  small print. Ingredients oyneineahazoline, hydocticone,  Derzallcoium, Consulted with Dr. Mahala Menghini and he advises OK to use for no more than 2-3 days.

## 2013-01-15 NOTE — Telephone Encounter (Signed)
Bought nasal spray for her allergies last night and wants to know if she can take it if she has elevated blood pressure - pls advise

## 2013-01-15 NOTE — Telephone Encounter (Signed)
Spoke with patient, nasal mist to help with nasal congestion (pt states it does not have another name) has written on it to check with MD if you have HTN.  She wanted to make sure it was ok with Dr. Earnest Bailey, advised I would send a message. Fleeger, Maryjo Rochester

## 2013-01-17 ENCOUNTER — Encounter (HOSPITAL_COMMUNITY): Payer: Self-pay | Admitting: *Deleted

## 2013-01-17 ENCOUNTER — Emergency Department (HOSPITAL_COMMUNITY)
Admission: EM | Admit: 2013-01-17 | Discharge: 2013-01-18 | Disposition: A | Discharge status: Home / Self Care | Payer: Medicaid Other | Attending: Emergency Medicine | Admitting: Emergency Medicine

## 2013-01-17 DIAGNOSIS — Z801 Family history of malignant neoplasm of trachea, bronchus and lung: Secondary | ICD-10-CM | POA: Insufficient documentation

## 2013-01-17 DIAGNOSIS — J449 Chronic obstructive pulmonary disease, unspecified: Secondary | ICD-10-CM | POA: Insufficient documentation

## 2013-01-17 DIAGNOSIS — I509 Heart failure, unspecified: Secondary | ICD-10-CM | POA: Insufficient documentation

## 2013-01-17 DIAGNOSIS — Z833 Family history of diabetes mellitus: Secondary | ICD-10-CM | POA: Insufficient documentation

## 2013-01-17 DIAGNOSIS — J4489 Other specified chronic obstructive pulmonary disease: Secondary | ICD-10-CM | POA: Insufficient documentation

## 2013-01-17 DIAGNOSIS — E039 Hypothyroidism, unspecified: Secondary | ICD-10-CM | POA: Insufficient documentation

## 2013-01-17 DIAGNOSIS — M129 Arthropathy, unspecified: Secondary | ICD-10-CM | POA: Insufficient documentation

## 2013-01-17 DIAGNOSIS — Z880 Allergy status to penicillin: Secondary | ICD-10-CM | POA: Insufficient documentation

## 2013-01-17 DIAGNOSIS — J40 Bronchitis, not specified as acute or chronic: Secondary | ICD-10-CM | POA: Insufficient documentation

## 2013-01-17 DIAGNOSIS — R0789 Other chest pain: Secondary | ICD-10-CM

## 2013-01-17 DIAGNOSIS — I10 Essential (primary) hypertension: Secondary | ICD-10-CM | POA: Insufficient documentation

## 2013-01-17 DIAGNOSIS — Z87891 Personal history of nicotine dependence: Secondary | ICD-10-CM | POA: Insufficient documentation

## 2013-01-17 DIAGNOSIS — I219 Acute myocardial infarction, unspecified: Secondary | ICD-10-CM | POA: Insufficient documentation

## 2013-01-17 DIAGNOSIS — Z79899 Other long term (current) drug therapy: Secondary | ICD-10-CM | POA: Insufficient documentation

## 2013-01-17 DIAGNOSIS — K219 Gastro-esophageal reflux disease without esophagitis: Secondary | ICD-10-CM | POA: Insufficient documentation

## 2013-01-17 DIAGNOSIS — R079 Chest pain, unspecified: Secondary | ICD-10-CM | POA: Insufficient documentation

## 2013-01-17 DIAGNOSIS — R0602 Shortness of breath: Secondary | ICD-10-CM | POA: Insufficient documentation

## 2013-01-17 DIAGNOSIS — J45909 Unspecified asthma, uncomplicated: Secondary | ICD-10-CM | POA: Insufficient documentation

## 2013-01-17 DIAGNOSIS — R51 Headache: Secondary | ICD-10-CM | POA: Insufficient documentation

## 2013-01-17 DIAGNOSIS — F411 Generalized anxiety disorder: Secondary | ICD-10-CM | POA: Insufficient documentation

## 2013-01-17 DIAGNOSIS — Z559 Problems related to education and literacy, unspecified: Secondary | ICD-10-CM | POA: Insufficient documentation

## 2013-01-17 DIAGNOSIS — Z9889 Other specified postprocedural states: Secondary | ICD-10-CM | POA: Insufficient documentation

## 2013-01-17 DIAGNOSIS — Z818 Family history of other mental and behavioral disorders: Secondary | ICD-10-CM | POA: Insufficient documentation

## 2013-01-17 DIAGNOSIS — R071 Chest pain on breathing: Secondary | ICD-10-CM | POA: Insufficient documentation

## 2013-01-17 DIAGNOSIS — E669 Obesity, unspecified: Secondary | ICD-10-CM | POA: Insufficient documentation

## 2013-01-17 HISTORY — DX: Other chronic pain: G89.29

## 2013-01-17 HISTORY — DX: Chest pain, unspecified: R07.9

## 2013-01-17 LAB — BASIC METABOLIC PANEL
Calcium: 8.7 mg/dL (ref 8.4–10.5)
GFR calc Af Amer: >90 mL/min (ref >90)
GFR calc non Af Amer: >90 mL/min (ref >90)
Glucose, Bld: 93 mg/dL (ref 70–99)
Potassium: 3.5 mEq/L (ref 3.5–5.1)
Sodium: 137 mEq/L (ref 135–145)

## 2013-01-17 LAB — CBC
Hemoglobin: 11.7 g/dL — ABNORMAL LOW (ref 12.0–15.0)
MCH: 27.6 pg (ref 26.0–34.0)
MCHC: 33.1 g/dL (ref 30.0–36.0)
Platelets: 250 10*3/uL (ref 150–400)
RDW: 13.6 % (ref 11.5–15.5)

## 2013-01-17 LAB — POCT I-STAT TROPONIN I: Troponin i, poc: 0 ng/mL (ref 0.00–0.08)

## 2013-01-17 MED ORDER — OXYCODONE-ACETAMINOPHEN 5-325 MG PO TABS
1.0000 | ORAL_TABLET | Freq: Once | ORAL | Status: AC
  Administered 2013-01-18: 1 via ORAL
  Filled 2013-01-17: qty 1

## 2013-01-17 MED ORDER — IBUPROFEN 200 MG PO TABS
400.0000 mg | ORAL_TABLET | Freq: Once | ORAL | Status: AC
  Administered 2013-01-18: 400 mg via ORAL
  Filled 2013-01-17: qty 2

## 2013-01-17 MED ORDER — ASPIRIN 325 MG PO TABS
325.0000 mg | ORAL_TABLET | ORAL | Status: AC
  Administered 2013-01-17: 325 mg via ORAL
  Filled 2013-01-17: qty 1

## 2013-01-17 NOTE — ED Notes (Signed)
Per EMS report: pt reports pain on her left chest.  Pain is palpable and pt reports having pain similar to this in the past.  Pt denies SOB.  No sweating noted.  Pt does not have a cardiac hx. Pt reports referred pain on her left side. Dr. Johney Frame from [facility] told her she was going to have a exploratory procedure tomorrow. BP: 160/80, HR: 60, RR: 18, 99% RA

## 2013-01-18 ENCOUNTER — Encounter: Payer: Self-pay | Admitting: *Deleted

## 2013-01-18 ENCOUNTER — Ambulatory Visit (INDEPENDENT_AMBULATORY_CARE_PROVIDER_SITE_OTHER): Payer: Medicaid Other | Admitting: Internal Medicine

## 2013-01-18 ENCOUNTER — Encounter: Payer: Self-pay | Admitting: Internal Medicine

## 2013-01-18 VITALS — BP 100/64 | HR 62 | Ht 68.0 in | Wt 227.4 lb

## 2013-01-18 DIAGNOSIS — I4729 Other ventricular tachycardia: Secondary | ICD-10-CM

## 2013-01-18 DIAGNOSIS — I472 Ventricular tachycardia, unspecified: Secondary | ICD-10-CM

## 2013-01-18 DIAGNOSIS — I4949 Other premature depolarization: Secondary | ICD-10-CM

## 2013-01-18 DIAGNOSIS — I493 Ventricular premature depolarization: Secondary | ICD-10-CM

## 2013-01-18 DIAGNOSIS — E669 Obesity, unspecified: Secondary | ICD-10-CM

## 2013-01-18 LAB — D-DIMER, QUANTITATIVE: D-Dimer, Quant: 0.34 ug/mL-FEU (ref 0.00–0.48)

## 2013-01-18 NOTE — ED Provider Notes (Signed)
History     CSN: [id number]  Arrival date & time 01/17/13  2204   First MD Initiated Contact with Patient 01/17/13 2308      Chief Complaint  Patient presents with  . Chest Pain     HPI Pt was seen at 2325.   Per pt, c/o gradual onset and persistence of constant left chest wall "pain" for the past 1 week.  Pt states her pain waxes and wanes, but has never gone away completely for the past 1 week.  Describes the pain as "sharp," worse with palpation of the area and movement of her torso and left arm.  The symptoms have been associated with no other complaints. The patient has a significant history of similar symptoms previously, recently being evaluated for this complaint and multiple prior evals for same, with most recent admission 5 days ago.  Denies palpitations, no SOB/cough, no back pain, no abd pain, no N/V/D, no fevers, no rash, no injury.      Past Medical History  Diagnosis Date  . Chest pain     a. s/p reported MI in 1997;  b. 09/2012 Cath: nl cors, nl LV ([place]);  c. 05/2012 Non-ischemic myoview.  . Hypertension   . Arthritis   . Problem with literacy   . Shortness of breath   . Anxiety   . Bronchitis   . Chronic headaches   . Obesity   . COPD (chronic obstructive pulmonary disease)   . Chronic diastolic CHF (congestive heart failure)     a. 12/2011 Echo: nl ef, Gr 1 DD;  b. 11/2012 Echo: EF 45-50%, no significant valvular abnormalities.  Marland Kitchen GERD (gastroesophageal reflux disease)   . Myocardial infarction 1997    self reported  . Hypothyroidism   . Asthma   . Chronic chest pain     Past Surgical History  Procedure Date  . Ectopic pregnancy surgery     right  . Nm myoview ltd June 2013    no reversible ischemia  . Cardiac catheterization October 2012    No coronary artery disease  . Cardiac catheterization 2012    normal coronary arteries  . Nm myoview ltd 2012, 2013    normal    Family History  Problem Relation Age of Onset  . Cancer Mother 72   lung ca  . Diabetes Mother   . Anxiety disorder Sister   . Cancer Brother 60    lung    History  Substance Use Topics  . Smoking status: Former Smoker -- 1.0 packs/day for 3 years    Quit date: 12/03/2010  . Smokeless tobacco: Not on file  . Alcohol Use: No    Review of Systems ROS: Statement: All systems negative except as marked or noted in the HPI; Constitutional: Negative for fever and chills. ; ; Eyes: Negative for eye pain, redness and discharge. ; ; ENMT: Negative for ear pain, hoarseness, nasal congestion, sinus pressure and sore throat. ; ; Cardiovascular: +CP. Negative for palpitations, diaphoresis, dyspnea and peripheral edema. ; ; Respiratory: Negative for cough, wheezing and stridor. ; ; Gastrointestinal: Negative for nausea, vomiting, diarrhea, abdominal pain, blood in stool, hematemesis, jaundice and rectal bleeding. ; ; Genitourinary: Negative for dysuria, flank pain and hematuria. ; ; Musculoskeletal: Negative for back pain and neck pain. Negative for swelling and trauma.; ; Skin: Negative for pruritus, rash, abrasions, blisters, bruising and skin lesion.; ; Neuro: Negative for headache, lightheadedness and neck stiffness. Negative for weakness, altered level of consciousness ,  altered mental status, extremity weakness, paresthesias, involuntary movement, seizure and syncope.     Allergies  Penicillins  Home Medications   Current Outpatient Rx  Name  Route  Sig  Dispense  Refill  . ALBUTEROL SULFATE HFA 108 (90 BASE) MCG/ACT IN AERS   Inhalation   Inhale 2 puffs into the lungs every 4 (four) hours as needed for wheezing or shortness of breath (cough).   1 Inhaler   5   . ASPIRIN EC 81 MG PO TBEC   Oral   Take 81 mg by mouth daily.         . ATORVASTATIN CALCIUM 10 MG PO TABS   Oral   Take 10 mg by mouth daily.         Marland Kitchen FLECAINIDE ACETATE 100 MG PO TABS   Oral   Take 1 tablet (100 mg total) by mouth every 12 (twelve) hours.   30 tablet   6   .  FUROSEMIDE 20 MG PO TABS   Oral   Take 1 tablet (20 mg total) by mouth daily.   30 tablet   6   . LEVOTHYROXINE SODIUM 25 MCG PO TABS   Oral   Take 25 mcg by mouth daily.         [place] METOPROLOL SUCCINATE ER 25 MG PO TB24   Oral   Take 1 tablet (25 mg total) by mouth daily.   30 tablet   0   . TIOTROPIUM BROMIDE MONOHYDRATE 18 MCG IN CAPS   Inhalation   Place 1 capsule (18 mcg total) into inhaler and inhale daily.   30 capsule   3     BP 130/63  Pulse 62  Temp 98.2 F (36.8 C) (Oral)  Resp 14  Ht 5\' 8"  (1.727 m)  Wt 222 lb (100.699 kg)  BMI 33.76 kg/m2  SpO2 98%  Physical Exam 2330: Physical examination:  Nursing notes reviewed; Vital signs and O2 SAT reviewed;  Constitutional: Well developed, Well nourished, Well hydrated, In no acute distress; Head:  Normocephalic, atraumatic; Eyes: EOMI, PERRL, No scleral icterus; ENMT: Mouth and pharynx normal, Mucous membranes moist; Neck: Supple, Full range of motion, No lymphadenopathy; Cardiovascular: Regular rate and rhythm, No murmur, rub, or gallop; Respiratory: Breath sounds clear & equal bilaterally, No rales, rhonchi, wheezes.  Speaking full sentences with ease, Normal respiratory effort/excursion; Chest: +TTP left parasternal and anterior upper chest wall which reproduces pt's symptoms, No rash, no soft tissue crepitus.  Movement normal; Abdomen: Soft, Nontender, Nondistended, Normal bowel sounds; Genitourinary: No CVA tenderness; Extremities: Pulses normal, No tenderness, No edema, No calf edema or asymmetry.; Neuro: AA&Ox3, Major CN grossly intact.  Speech clear. No gross focal motor or sensory deficits in extremities.; Skin: Color normal, Warm, Dry.   ED Course  Procedures   0100:  Doubt PE as cause for pain given normal d-dimer and low risk [place]. Doubt ACS as cause for pain given constant pain for the past 1 week, normal troponin and unchanged EKG from previous.  Reassured with normal cardiac cath (2012) and stress myoview  (2012, 2013); as well as the fact that pt was admitted 5 days ago to the hospital for similar complaints.  EPIC chart reviewed:  Pt has had extensive cardiac workups over the past several years for her chest pain that have been negative for CAD to date.  States she feels better after pain meds and wants to go home now. Dx and testing d/w pt and family.  Questions answered.  Verb understanding, agreeable to d/c home with outpt f/u.      MDM  MDM Reviewed: previous chart, nursing note and vitals Reviewed previous: labs and ECG Interpretation: labs, ECG and x-ray      Date: 01/18/2013  Rate: 59  Rhythm: normal sinus rhythm  QRS Axis: normal  Intervals: normal  ST/T Wave abnormalities: nonspecific ST/T changes  Conduction Disutrbances:none  Narrative Interpretation:   Old EKG Reviewed: unchanged; no significant changes from previous EKG dated 01/12/2013.  Results for orders placed during the hospital encounter of 01/17/13  CBC      Component Value Range   WBC 7.5  4.0 - 10.5 K/uL   RBC 4.24  3.87 - 5.11 MIL/uL   Hemoglobin 11.7 (*) 12.0 - 15.0 g/dL   HCT 78.2 (*) 95.6 - 21.3 %   MCV 83.3  78.0 - 100.0 fL   MCH 27.6  26.0 - 34.0 pg   MCHC 33.1  30.0 - 36.0 g/dL   RDW 08.6  57.8 - 46.9 %   Platelets 250  150 - 400 K/uL  BASIC METABOLIC PANEL      Component Value Range   Sodium 137  135 - 145 mEq/L   Potassium 3.5  3.5 - 5.1 mEq/L   Chloride 102  96 - 112 mEq/L   CO2 24  19 - 32 mEq/L   Glucose, Bld 93  70 - 99 mg/dL   BUN 10  6 - 23 mg/dL   Creatinine, Ser 6.29  0.50 - 1.10 mg/dL   Calcium 8.7  8.4 - 52.8 mg/dL   GFR calc non Af Amer >90  >90 mL/min   GFR calc Af Amer >90  >90 mL/min  POCT I-STAT TROPONIN I      Component Value Range   Troponin i, poc 0.00  0.00 - 0.08 ng/mL   Comment 3           D-DIMER, QUANTITATIVE      Component Value Range   D-Dimer, Quant 0.34  0.00 - 0.48 ug/mL-FEU   Dg Chest 2 View 01/11/2013  *RADIOLOGY REPORT*  Clinical Data: Centralized chest  pain and weakness  CHEST - 2 VIEW  Comparison: 12/24/2012; 11/30/2012; 06/12/2012  Findings: Unchanged cardiac silhouette and mediastinal contours with calcified bilateral hilar lymph nodes, the sequela of prior granulomatous infection.  There is unchanged mild diffuse thickening of pulmonary interstitium.  Persistent mild elevation of the right hemidiaphragm.  No focal airspace opacity.  No pleural effusion or pneumothorax.  Unchanged bones.  IMPRESSION: 1.  No acute cardiopulmonary disease. 2.  Sequela of prior granulomatous infection as above.   Original Report Authenticated By: Tacey Ruiz, MD               Laray Anger, DO 01/20/13 [phone]

## 2013-01-18 NOTE — Assessment & Plan Note (Signed)
She continues to have palpitations/ symptomatic PVCs and NSVT.  She has failed medical therapy with beta blockers and flecainide.  Her PVCs/ NSVT likely arise from the outflow tract of either the RV or LV. Therapeutic strategies for PVCs and nonsustained ventricular tachycardia including medicine and ablation were discussed in detail with the patient today. Risk, benefits, and alternatives to EP study and radiofrequency ablation were also discussed in detail today. These risks include but are not limited to stroke, bleeding, vascular damage, tamponade, perforation, damage to the heart and other structures, AV block requiring pacemaker, worsening renal function, and death. The patient understands these risk and wishes to proceed.  We will therefore proceed with catheter ablation at the next available time.

## 2013-01-18 NOTE — Telephone Encounter (Signed)
appointment today at 4:15 to discuss ablation.  lmom

## 2013-01-18 NOTE — Progress Notes (Signed)
Primary Care Physician: BRISCOE, KIM, MD Referring Physician:   Dr Nishan   [name] is a 56 y.o. female with a h/o PVCs who presents today for EP follow-up.  She recently presented to be seen by Lori Gerhardt.  She continues to have frequent episodes of symptomatic palpitations due to PVCs despite medical therapy with beta blockers and flecainide 100mg bid.  Today, she denies symptoms of chest pain,  dizziness, presyncope, syncope, or neurologic sequela. The patient is tolerating medications without difficulties and is otherwise without complaint today.   Past Medical History  Diagnosis Date  . Chest pain     a. s/p reported MI in 1997;  b. 09/2012 Cath: nl cors, nl LV ([place]);  c. 05/2012 Non-ischemic myoview.  . Hypertension   . Arthritis   . Problem with literacy   . Shortness of breath   . Anxiety   . Bronchitis   . Chronic headaches   . Obesity   . COPD (chronic obstructive pulmonary disease)   . Chronic diastolic CHF (congestive heart failure)     a. 12/2011 Echo: nl ef, Gr 1 DD;  b. 11/2012 Echo: EF 45-50%, no significant valvular abnormalities.  . GERD (gastroesophageal reflux disease)   . Myocardial infarction 1997    self reported  . Hypothyroidism   . Asthma   . Chronic chest pain    Past Surgical History  Procedure Date  . Ectopic pregnancy surgery     right  . Nm myoview ltd June 2013    no reversible ischemia  . Cardiac catheterization October 2012    No coronary artery disease  . Cardiac catheterization 2012    normal coronary arteries  . Nm myoview ltd 2012, 2013    normal    Current Outpatient Prescriptions  Medication Sig Dispense Refill  . albuterol (PROVENTIL HFA;VENTOLIN HFA) 108 (90 BASE) MCG/ACT inhaler Inhale 2 puffs into the lungs every 4 (four) hours as needed for wheezing or shortness of breath (cough).  1 Inhaler  5  . aspirin EC 81 MG tablet Take 81 mg by mouth daily.      . atorvastatin (LIPITOR) 10 MG tablet Take 10 mg by mouth  daily.      . flecainide (TAMBOCOR) 100 MG tablet Take 1 tablet (100 mg total) by mouth every 12 (twelve) hours.  30 tablet  6  . furosemide (LASIX) 20 MG tablet Take 1 tablet (20 mg total) by mouth daily.  30 tablet  6  . levothyroxine (SYNTHROID, LEVOTHROID) 25 MCG tablet Take 25 mcg by mouth daily.      . metoprolol succinate (TOPROL-XL) 25 MG 24 hr tablet Take 1 tablet (25 mg total) by mouth daily.  30 tablet  0  . tiotropium (SPIRIVA HANDIHALER) 18 MCG inhalation capsule Place 1 capsule (18 mcg total) into inhaler and inhale daily.  30 capsule  3    Allergies  Allergen Reactions  . Penicillins Rash    History   Social History  . Marital Status: Legally Separated    Spouse Name: N/A    Number of Children: N/A  . Years of Education: N/A   Occupational History  . Not on file.   Social History Main Topics  . Smoking status: Former Smoker -- 1.0 packs/day for 3 years    Quit date: 12/03/2010  . Smokeless tobacco: Not on file  . Alcohol Use: No  . Drug Use: No  . Sexually Active: No   Other Topics Concern  .   Not on file   Social History Narrative   Lives with a female friend, just roommates, separated.  Has 4 kids, all grown.  Finished 10th grade.  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  Lives in [place]    Family History  Problem Relation Age of Onset  . Cancer Mother 86    lung ca  . Diabetes Mother   . Anxiety disorder Sister   . Cancer Brother 60    lung    ROS- All systems are reviewed and negative except as per the HPI above  Physical Exam: Filed Vitals:   01/18/13 1621  BP: 100/64  Pulse: 62  Height: 5' 8" (1.727 m)  Weight: 227 lb 6.4 oz (103.148 kg)    GEN- The patient is morbidly obese appearing, alert and oriented x 3 today.   Head- normocephalic, atraumatic Eyes-  Sclera clear, conjunctiva pink Ears- hearing intact Oropharynx- clear Neck- supple, no JVP Lymph- no cervical lymphadenopathy Lungs- Clear to ausculation bilaterally,  normal work of breathing Heart- Regular rate and rhythm, no murmurs, rubs or gallops, PMI not laterally displaced GI- soft, NT, ND, + BS Extremities- no clubbing, cyanosis, or edema MS- no significant deformity or atrophy Skin- no rash or lesion Psych- euthymic mood, full affect Neuro- strength and sensation are intact  Previously ekgs reveals sinus bradycardia with PVCs in couplets.  PVCs are of a LBB inferior axis morphology with abrupt transition in V3 EKG today reveals sinus rhythm 58 bpm, LPHB, otherwise normal ekg  Assessment and Plan:  

## 2013-01-18 NOTE — Assessment & Plan Note (Signed)
Weight loss is advised 

## 2013-01-18 NOTE — Patient Instructions (Addendum)
Your physician has recommended that you have an ablation. Catheter ablation is a medical procedure used to treat some cardiac arrhythmias (irregular heartbeats). During catheter ablation, a long, thin, flexible tube is put into a blood vessel in your groin (upper thigh), or neck. This tube is called an ablation catheter. It is then guided to your heart through the blood vessel. Radio frequency waves destroy small areas of heart tissue where abnormal heartbeats may cause an arrhythmia to start. Please see the instruction sheet given to you today.---VT ablation scheduled for 02/09/13   See instruction sheet

## 2013-01-18 NOTE — Assessment & Plan Note (Signed)
Improved Cardiac catheterization October 2012: Normal current coronary arteries and preserved LV systolic function

## 2013-01-19 ENCOUNTER — Other Ambulatory Visit: Payer: Self-pay | Admitting: *Deleted

## 2013-01-19 ENCOUNTER — Inpatient Hospital Stay: Payer: Medicaid Other | Admitting: Family Medicine

## 2013-01-19 DIAGNOSIS — I472 Ventricular tachycardia, unspecified: Secondary | ICD-10-CM

## 2013-01-19 DIAGNOSIS — Z01812 Encounter for preprocedural laboratory examination: Secondary | ICD-10-CM

## 2013-02-01 ENCOUNTER — Encounter (HOSPITAL_COMMUNITY): Payer: Self-pay | Admitting: Pharmacy Technician

## 2013-02-02 ENCOUNTER — Other Ambulatory Visit (INDEPENDENT_AMBULATORY_CARE_PROVIDER_SITE_OTHER): Payer: Medicaid Other

## 2013-02-02 DIAGNOSIS — I4729 Other ventricular tachycardia: Secondary | ICD-10-CM

## 2013-02-02 DIAGNOSIS — I472 Ventricular tachycardia, unspecified: Secondary | ICD-10-CM

## 2013-02-02 DIAGNOSIS — Z01812 Encounter for preprocedural laboratory examination: Secondary | ICD-10-CM

## 2013-02-02 LAB — CBC WITH DIFFERENTIAL/PLATELET
Basophils Absolute: 0.1 10*3/uL (ref 0.0–0.1)
Eosinophils Absolute: 0.3 10*3/uL (ref 0.0–0.7)
HCT: 39.9 % (ref 36.0–46.0)
Hemoglobin: 13.2 g/dL (ref 12.0–15.0)
Lymphs Abs: 2.8 10*3/uL (ref 0.7–4.0)
MCHC: 33 g/dL (ref 30.0–36.0)
Monocytes Absolute: 0.5 10*3/uL (ref 0.1–1.0)
Neutro Abs: 3 10*3/uL (ref 1.4–7.7)
RDW: 14.6 % (ref 11.5–14.6)

## 2013-02-02 LAB — BASIC METABOLIC PANEL
BUN: 13 mg/dL (ref 6–23)
GFR: 67.97 mL/min (ref >60.00)
Glucose, Bld: 101 mg/dL — ABNORMAL HIGH (ref 70–99)
Potassium: 4 mEq/L (ref 3.5–5.1)

## 2013-02-05 ENCOUNTER — Ambulatory Visit: Payer: Medicaid Other | Admitting: Internal Medicine

## 2013-02-09 ENCOUNTER — Encounter (HOSPITAL_COMMUNITY): Payer: Self-pay | Admitting: Certified Registered"

## 2013-02-09 ENCOUNTER — Encounter (HOSPITAL_COMMUNITY)
Admission: RE | Disposition: A | Discharge status: Home / Self Care | Payer: Self-pay | Source: Ambulatory Visit | Attending: Internal Medicine

## 2013-02-09 ENCOUNTER — Ambulatory Visit (HOSPITAL_COMMUNITY)
Admission: RE | Admit: 2013-02-09 | Discharge: 2013-02-10 | Disposition: A | Discharge status: Home / Self Care | Payer: Medicaid Other | Source: Ambulatory Visit | Attending: Internal Medicine | Admitting: Internal Medicine

## 2013-02-09 ENCOUNTER — Ambulatory Visit (HOSPITAL_COMMUNITY): Payer: Medicaid Other | Admitting: Certified Registered"

## 2013-02-09 DIAGNOSIS — I472 Ventricular tachycardia, unspecified: Secondary | ICD-10-CM | POA: Insufficient documentation

## 2013-02-09 DIAGNOSIS — I4729 Other ventricular tachycardia: Secondary | ICD-10-CM | POA: Insufficient documentation

## 2013-02-09 DIAGNOSIS — J449 Chronic obstructive pulmonary disease, unspecified: Secondary | ICD-10-CM | POA: Insufficient documentation

## 2013-02-09 DIAGNOSIS — I5022 Chronic systolic (congestive) heart failure: Secondary | ICD-10-CM | POA: Insufficient documentation

## 2013-02-09 DIAGNOSIS — I10 Essential (primary) hypertension: Secondary | ICD-10-CM | POA: Insufficient documentation

## 2013-02-09 DIAGNOSIS — J4489 Other specified chronic obstructive pulmonary disease: Secondary | ICD-10-CM | POA: Insufficient documentation

## 2013-02-09 DIAGNOSIS — I493 Ventricular premature depolarization: Secondary | ICD-10-CM | POA: Diagnosis present

## 2013-02-09 DIAGNOSIS — R002 Palpitations: Secondary | ICD-10-CM | POA: Diagnosis present

## 2013-02-09 DIAGNOSIS — I509 Heart failure, unspecified: Secondary | ICD-10-CM | POA: Insufficient documentation

## 2013-02-09 HISTORY — PX: V-TACH ABLATION: SHX5498

## 2013-02-09 SURGERY — V-TACH ABLATION
Anesthesia: Monitor Anesthesia Care

## 2013-02-09 MED ORDER — PROPOFOL 10 MG/ML IV BOLUS: INTRAVENOUS | Status: DC | PRN

## 2013-02-09 MED ORDER — TIOTROPIUM BROMIDE MONOHYDRATE 18 MCG IN CAPS
18.0000 ug | ORAL_CAPSULE | Freq: Every day | RESPIRATORY_TRACT | Status: DC
  Administered 2013-02-09 – 2013-02-10 (×2): 18 ug via RESPIRATORY_TRACT
  Filled 2013-02-09: qty 5

## 2013-02-09 MED ORDER — ISOPROTERENOL HCL 0.2 MG/ML IJ SOLN
1000.0000 ug | INTRAVENOUS | Status: DC | PRN
  Administered 2013-02-09: 2 ug/min via INTRAVENOUS

## 2013-02-09 MED ORDER — SODIUM CHLORIDE 0.9 % IV SOLN: 250.0000 mL | INTRAVENOUS | Status: DC | PRN

## 2013-02-09 MED ORDER — HYDROCODONE-ACETAMINOPHEN 5-325 MG PO TABS: 1.0000 | ORAL_TABLET | ORAL | Status: DC | PRN

## 2013-02-09 MED ORDER — PROPOFOL INFUSION 10 MG/ML OPTIME
INTRAVENOUS | Status: DC | PRN
  Administered 2013-02-09: 100 ug/kg/min via INTRAVENOUS

## 2013-02-09 MED ORDER — FUROSEMIDE 20 MG PO TABS
20.0000 mg | ORAL_TABLET | Freq: Every day | ORAL | Status: DC
  Filled 2013-02-09 (×2): qty 1

## 2013-02-09 MED ORDER — ACETAMINOPHEN 325 MG PO TABS
650.0000 mg | ORAL_TABLET | ORAL | Status: DC | PRN
  Administered 2013-02-09: 650 mg via ORAL
  Filled 2013-02-09: qty 2

## 2013-02-09 MED ORDER — DEXTROSE 5 % IV SOLN
INTRAVENOUS | Status: AC
  Filled 2013-02-09: qty 250

## 2013-02-09 MED ORDER — LEVOTHYROXINE SODIUM 25 MCG PO TABS
25.0000 ug | ORAL_TABLET | Freq: Every day | ORAL | Status: DC
  Administered 2013-02-10: 25 ug via ORAL
  Filled 2013-02-09 (×2): qty 1

## 2013-02-09 MED ORDER — SODIUM CHLORIDE 0.9 % IJ SOLN
3.0000 mL | Freq: Two times a day (BID) | INTRAMUSCULAR | Status: DC
  Administered 2013-02-09: 3 mL via INTRAVENOUS

## 2013-02-09 MED ORDER — ASPIRIN EC 81 MG PO TBEC
81.0000 mg | DELAYED_RELEASE_TABLET | Freq: Every day | ORAL | Status: DC
  Administered 2013-02-09: 81 mg via ORAL
  Filled 2013-02-09 (×2): qty 1

## 2013-02-09 MED ORDER — ATORVASTATIN CALCIUM 10 MG PO TABS
10.0000 mg | ORAL_TABLET | Freq: Every day | ORAL | Status: DC
  Administered 2013-02-09: 10 mg via ORAL
  Filled 2013-02-09 (×2): qty 1

## 2013-02-09 MED ORDER — SODIUM CHLORIDE 0.9 % IV SOLN
INTRAVENOUS | Status: DC | PRN
  Administered 2013-02-09: 12:00:00 via INTRAVENOUS

## 2013-02-09 MED ORDER — SODIUM CHLORIDE 0.9 % IJ SOLN: 3.0000 mL | INTRAMUSCULAR | Status: DC | PRN

## 2013-02-09 MED ORDER — FENTANYL CITRATE 0.05 MG/ML IJ SOLN
INTRAMUSCULAR | Status: DC | PRN
  Administered 2013-02-09: 100 ug via INTRAVENOUS
  Administered 2013-02-09: 50 ug via INTRAVENOUS

## 2013-02-09 MED ORDER — BUPIVACAINE HCL (PF) 0.25 % IJ SOLN
INTRAMUSCULAR | Status: AC
  Filled 2013-02-09: qty 30

## 2013-02-09 MED ORDER — HEPARIN SODIUM (PORCINE) 1000 UNIT/ML IJ SOLN
INTRAMUSCULAR | Status: DC | PRN
  Administered 2013-02-09: 5000 [IU] via INTRAVENOUS

## 2013-02-09 MED ORDER — MIDAZOLAM HCL 5 MG/5ML IJ SOLN
INTRAMUSCULAR | Status: DC | PRN
  Administered 2013-02-09: 2 mg via INTRAVENOUS

## 2013-02-09 MED ORDER — ONDANSETRON HCL 4 MG/2ML IJ SOLN: 4.0000 mg | Freq: Four times a day (QID) | INTRAMUSCULAR | Status: DC | PRN

## 2013-02-09 MED ORDER — ALBUTEROL SULFATE HFA 108 (90 BASE) MCG/ACT IN AERS
2.0000 | INHALATION_SPRAY | RESPIRATORY_TRACT | Status: DC | PRN
  Filled 2013-02-09: qty 6.7

## 2013-02-09 MED ORDER — PROPOFOL 10 MG/ML IV BOLUS
INTRAVENOUS | Status: DC | PRN
  Administered 2013-02-09 (×5): 20 mg via INTRAVENOUS
  Administered 2013-02-09: 10 mg via INTRAVENOUS

## 2013-02-09 MED ORDER — HYDROXYUREA 500 MG PO CAPS
ORAL_CAPSULE | ORAL | Status: AC
  Filled 2013-02-09: qty 1

## 2013-02-09 NOTE — Anesthesia Preprocedure Evaluation (Signed)
Anesthesia Evaluation  Patient identified by MRN, date of birth, ID band Patient awake    Reviewed: Allergy & Precautions, H&P , NPO status , Patient's Chart, lab work & pertinent test results  Airway Mallampati: II  Neck ROM: full    Dental   Pulmonary shortness of breath, COPDformer smoker,          Cardiovascular hypertension, + Past MI and +CHF + dysrhythmias Ventricular Tachycardia     Neuro/Psych  Headaches, Anxiety    GI/Hepatic GERD-  ,  Endo/Other  Hypothyroidism obese  Renal/GU      Musculoskeletal  (+) Arthritis -,   Abdominal   Peds  Hematology   Anesthesia Other Findings   Reproductive/Obstetrics                           Anesthesia Physical Anesthesia Plan  ASA: III  Anesthesia Plan: MAC   Post-op Pain Management:    Induction: Intravenous  Airway Management Planned: Simple Face Mask  Additional Equipment:   Intra-op Plan:   Post-operative Plan:   Informed Consent: I have reviewed the patients History and Physical, chart, labs and discussed the procedure including the risks, benefits and alternatives for the proposed anesthesia with the patient or authorized representative who has indicated his/her understanding and acceptance.     Plan Discussed with: CRNA and Surgeon  Anesthesia Plan Comments:         Anesthesia Quick Evaluation

## 2013-02-09 NOTE — H&P (View-Only) (Signed)
Primary Care Physician: Delbert Harness, MD Referring Physician:   Dr Malachi Paradise [name] is a 57 y.o. female with a h/o PVCs who presents today for EP follow-up.  She recently presented to be seen by Norma Fredrickson.  She continues to have frequent episodes of symptomatic palpitations due to PVCs despite medical therapy with beta blockers and flecainide 100mg  bid.  Today, she denies symptoms of chest pain,  dizziness, presyncope, syncope, or neurologic sequela. The patient is tolerating medications without difficulties and is otherwise without complaint today.   Past Medical History  Diagnosis Date  . Chest pain     a. s/p reported MI in 1997;  b. 09/2012 Cath: nl cors, nl LV ([place]);  c. 05/2012 Non-ischemic myoview.  . Hypertension   . Arthritis   . Problem with literacy   . Shortness of breath   . Anxiety   . Bronchitis   . Chronic headaches   . Obesity   . COPD (chronic obstructive pulmonary disease)   . Chronic diastolic CHF (congestive heart failure)     a. 12/2011 Echo: nl ef, Gr 1 DD;  b. 11/2012 Echo: EF 45-50%, no significant valvular abnormalities.  Marland Kitchen GERD (gastroesophageal reflux disease)   . Myocardial infarction 1997    self reported  . Hypothyroidism   . Asthma   . Chronic chest pain    Past Surgical History  Procedure Date  . Ectopic pregnancy surgery     right  . Nm myoview ltd June 2013    no reversible ischemia  . Cardiac catheterization October 2012    No coronary artery disease  . Cardiac catheterization 2012    normal coronary arteries  . Nm myoview ltd 2012, 2013    normal    Current Outpatient Prescriptions  Medication Sig Dispense Refill  . albuterol (PROVENTIL HFA;VENTOLIN HFA) 108 (90 BASE) MCG/ACT inhaler Inhale 2 puffs into the lungs every 4 (four) hours as needed for wheezing or shortness of breath (cough).  1 Inhaler  5  . aspirin EC 81 MG tablet Take 81 mg by mouth daily.      Marland Kitchen atorvastatin (LIPITOR) 10 MG tablet Take 10 mg by mouth  daily.      . flecainide (TAMBOCOR) 100 MG tablet Take 1 tablet (100 mg total) by mouth every 12 (twelve) hours.  30 tablet  6  . furosemide (LASIX) 20 MG tablet Take 1 tablet (20 mg total) by mouth daily.  30 tablet  6  . levothyroxine (SYNTHROID, LEVOTHROID) 25 MCG tablet Take 25 mcg by mouth daily.      . metoprolol succinate (TOPROL-XL) 25 MG 24 hr tablet Take 1 tablet (25 mg total) by mouth daily.  30 tablet  0  . tiotropium (SPIRIVA HANDIHALER) 18 MCG inhalation capsule Place 1 capsule (18 mcg total) into inhaler and inhale daily.  30 capsule  3    Allergies  Allergen Reactions  . Penicillins Rash    History   Social History  . Marital Status: Legally Separated    Spouse Name: N/A    Number of Children: N/A  . Years of Education: N/A   Occupational History  . Not on file.   Social History Main Topics  . Smoking status: Former Smoker -- 1.0 packs/day for 3 years    Quit date: 12/03/2010  . Smokeless tobacco: Not on file  . Alcohol Use: No  . Drug Use: No  . Sexually Active: No   Other Topics Concern  .  Not on file   Social History Narrative   Lives with a female friend, just roommates, separated.  Has 4 kids, all grown.  Finished 10th grade.  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  Lives in [place]    Family History  Problem Relation Age of Onset  . Cancer Mother 47    lung ca  . Diabetes Mother   . Anxiety disorder Sister   . Cancer Brother 60    lung    ROS- All systems are reviewed and negative except as per the HPI above  Physical Exam: Filed Vitals:   01/18/13 1621  BP: 100/64  Pulse: 62  Height: 5\' 8"  (1.727 m)  Weight: 227 lb 6.4 oz (103.148 kg)    GEN- The patient is morbidly obese appearing, alert and oriented x 3 today.   Head- normocephalic, atraumatic Eyes-  Sclera clear, conjunctiva pink Ears- hearing intact Oropharynx- clear Neck- supple, no JVP Lymph- no cervical lymphadenopathy Lungs- Clear to ausculation bilaterally,  normal work of breathing Heart- Regular rate and rhythm, no murmurs, rubs or gallops, PMI not laterally displaced GI- soft, NT, ND, + BS Extremities- no clubbing, cyanosis, or edema MS- no significant deformity or atrophy Skin- no rash or lesion Psych- euthymic mood, full affect Neuro- strength and sensation are intact  Previously ekgs reveals sinus bradycardia with PVCs in couplets.  PVCs are of a LBB inferior axis morphology with abrupt transition in V3 EKG today reveals sinus rhythm 58 bpm, LPHB, otherwise normal ekg  Assessment and Plan:

## 2013-02-09 NOTE — Interval H&P Note (Signed)
History and Physical Interval Note:  02/09/2013 11:14 AM  [name]  has presented today for surgery, with the diagnosis of VT Ablation  The various methods of treatment have been discussed with the patient and family. After consideration of risks, benefits and other options for treatment, the patient has consented to  Procedure(s): V-TACH ABLATION (N/A) as a surgical intervention .  The patient's history has been reviewed, patient examined, no change in status, stable for surgery.  I have reviewed the patient's chart and labs.  Questions were answered to the patient's satisfaction.     Hillis Range

## 2013-02-09 NOTE — Transfer of Care (Signed)
Immediate Anesthesia Transfer of Care Note  Patient: [name]  Procedure(s) Performed: Procedure(s): V-TACH ABLATION (N/A)  Patient Location: Cath Lab  Anesthesia Type:MAC  Level of Consciousness: awake, alert  and oriented  Airway & Oxygen Therapy: Patient Spontanous Breathing  Post-op Assessment: Report given to PACU RN, Post -op Vital signs reviewed and stable and Patient moving all extremities  Post vital signs: Reviewed and stable  Complications: No apparent anesthesia complications

## 2013-02-10 DIAGNOSIS — I4949 Other premature depolarization: Secondary | ICD-10-CM

## 2013-02-10 HISTORY — PX: OTHER SURGICAL HISTORY: SHX169

## 2013-02-10 NOTE — Discharge Summary (Signed)
ELECTROPHYSIOLOGY DISCHARGE SUMMARY    Patient ID: [name],  MRN: [id number], DOB/AGE: September 02, 1956 57 y.o.  Admit date: 02/09/2013 Discharge date: 02/10/2013  Primary Care Physician: Delbert Harness, MD Primary Cardiologist: Johney Frame, MD  Primary Discharge Diagnosis:  1. Symptomatic PVCs/NSVT s/p EPS +RF ablation  Secondary Discharge Diagnoses:  1. Obesity 2. Hypothyroidism 3. COPD 4. HTN 5. Chronic diastolic HF 6. Normal coronary arteries s/p cath 09/2012  Procedures This Admission:  1. Comprehensive EP study.  2. Coronary sinus pacing and recording.  3. Three-dimensional mapping of ventricular tachycardia.  4. Radiofrequency ablation of ventricular tachycardia.  5. Isoproterenol infusion.  6. Arterial blood pressure monitoring. CONCLUSIONS:  1. Sinus rhythm with frequent PVCs upon presentation.  2. Successful ablation of nonsustained ventricular tachycardia and near incessant premature ventricular contractions from the anteroseptal right ventricular outflow tract.  3. No inducible arrhythmias following ablation both on and off isoproterenol.  4. No early apparent complications.  History and Hospital Course:  [name] is a 57 year old woman with symptomatic PVCs despite medical therapy with beta blockers and flecainide 100 mg BID. She presented yesterday for EPS +RF ablation. Please see procedure details as outlined above. Ms. Fata tolerated this procedure well without any immediate complication. She remains hemodynamically stable and afebrile. Her groin site is intact without significant bleeding or hematoma. Telemetry shows sinus rhythm with no PVCs/NSVT. She has been given discharge instructions including wound care and activity restrictions. She will discontinue flecainide but continue metoprolol. There were no other changes made to her medications. She will follow-up in clinic in 4 weeks. She has been seen, examined and deemed stable for discharge today by  Dr. Hillis Range.  Discharge Vitals: Blood pressure 105/69, pulse 76, temperature 98.1 F (36.7 C), temperature source Oral, resp. rate 18, height 5\' 8"  (1.727 m), weight 228 lb 14.4 oz (103.828 kg), SpO2 94.00%.   Labs: Lab Results  Component Value Date   WBC 6.7 02/02/2013   HGB 13.2 02/02/2013   HCT 39.9 02/02/2013   MCV 83.3 02/02/2013   PLT 292.0 02/02/2013   No results found for this basename: NA, K, CL, CO2, BUN, CREATININE, CALCIUM, LABALBU, PROT, BILITOT, ALKPHOS, ALT, AST, GLUCOSE,  in the last 168 hours  Disposition:  The patient is being discharged in stable condition.  Follow-up: Follow-up Information   Follow up with Hillis Range, MD On 03/10/2013. (At 4:00 PM)    Contact information:   [address] [place] [address] [phone]    Discharge Medications:    Medication List    STOP taking these medications       flecainide 100 MG tablet  Commonly known as:  TAMBOCOR      TAKE these medications       albuterol 108 (90 BASE) MCG/ACT inhaler  Commonly known as:  PROVENTIL HFA;VENTOLIN HFA  Inhale 2 puffs into the lungs every 4 (four) hours as needed for wheezing or shortness of breath (cough).     aspirin EC 81 MG tablet  Take 81 mg by mouth daily.     atorvastatin 10 MG tablet  Commonly known as:  LIPITOR  Take 10 mg by mouth daily.     furosemide 20 MG tablet  Commonly known as:  LASIX  Take 1 tablet (20 mg total) by mouth daily.     levothyroxine 25 MCG tablet  Commonly known as:  SYNTHROID, LEVOTHROID  Take 25 mcg by mouth daily.     metoprolol succinate  25 MG 24 hr tablet  Commonly known as:  TOPROL-XL  Take 1 tablet (25 mg total) by mouth daily.     tiotropium 18 MCG inhalation capsule  Commonly known as:  SPIRIVA HANDIHALER  Place 1 capsule (18 mcg total) into inhaler and inhale daily.      Duration of Discharge Encounter: Greater than 30 minutes including physician time.  Limmie Patricia, PA-C 02/10/2013,  9:04 AM   Hillis Range, MD

## 2013-02-10 NOTE — Op Note (Signed)
NAMEVASILISA, Cherry NO.:  [id number]  MEDICAL RECORD NO.:  [id number]  LOCATION:  [id number]                        FACILITY:  [facility]  PHYSICIAN:  Hillis Range, MD       DATE OF BIRTH:  September 19, 1956  DATE OF PROCEDURE:  02/09/2013 DATE OF DISCHARGE:                              OPERATIVE REPORT   SURGEON:  Hillis Range, MD  PREPROCEDURE DIAGNOSIS:  Ventricular tachycardia.  POSTPROCEDURE DIAGNOSIS:  Ventricular tachycardia.  PROCEDURES: 1. Comprehensive EP study. 2. Coronary sinus pacing and recording. 3. Three-dimensional mapping of ventricular tachycardia. 4. Radiofrequency ablation of ventricular tachycardia. 5. Isoproterenol infusion. 6. Arterial blood pressure monitoring.  INTRODUCTION:  [name] is a pleasant 57 year old female with a history of symptomatic premature ventricular contractions.  She has failed multiple medications including beta-blockers, calcium channel blockers, and flecainide.  She presents today for EP study and radiofrequency ablation.  DESCRIPTION OF PROCEDURE:  Informed written consent was obtained and the patient was brought to the electrophysiology lab in a fasting state. She was adequately sedated with intravenous medications as outlined in the anesthesia report.  The patient's right groin was prepped and draped in usual sterile fashion by the EP lab staff.  Using a percutaneous Seldinger technique, two 6-French and one 8-French hemostasis sheaths were placed in the right common femoral vein.  An 8-French hemostasis sheath was placed into the right common femoral artery for blood pressure monitoring throughout the case.  The patient presented to the electrophysiology lab in normal sinus rhythm.  Her PR interval measured 147 milliseconds with a QRS duration of 82 milliseconds and a QT interval of 410 milliseconds.  Her average RR interval was 699 milliseconds.  Her AH interval measured 74 milliseconds with an HV interval of  47 milliseconds.  She was noted to have frequent premature ventricular contractions, which were of a left bundle-branch inferior axis morphology with a QRS duration, which measured 145 milliseconds.  A 6-French decapolar Polaris X catheter was introduced through the right common femoral vein and advanced into the coronary sinus for recording and pacing from this location.  A 6-French quadripolar Josephson catheter was introduced through the right common femoral vein and advanced into the right ventricle for recording and pacing.  This catheter was pulled back to the His bundle location.  The patient was noted to have at this point very frequent ventricular ectopy, frequently in couplets followed by sinus beat followed by couplets again.  This tachycardia became incessant.  EP study therefore could not be performed and I elected to perform direct anatomical and activation mapping of the tachycardia.  A 7-French Biosense Webster EZ Steer 4 mm ablation catheter was introduced through the right common femoral vein and advanced into the right ventricle.  Three-dimensional electroanatomical mapping was performed during the ventricular tachycardia using the Carto mapping system.  This demonstrated that the tachycardia arose from the anteroseptal right ventricular outflow tract.  Initially in this location with 3D mapping, the earliest activation proceeded to the surface QRS by only 24 milliseconds.  A pace map was performed, which revealed 11/12 pace map match.  A series of 4 radiofrequency applications were delivered in this location, which did not affect the  tachycardia.  At this point, I elected to perform mapping from the left ventricular outflow tract.  The ablation catheter was therefore removed from the body and readvanced through the right common femoral artery into the aortic root using a retrograde aortic approach.  Three- dimensional activation mapping was then performed during  tachycardia throughout the aortic root.  This included the non-coronary cusp, left coronary cusp, and right coronary cusp.  There were no early activations found within the left ventricular outflow tract.  It was therefore felt that the tachycardia did indeed arise from the right ventricular outflow tract.  The ablation catheter was therefore removed and readvanced through the right common femoral vein into the right ventricle. Additional and extensive activation mapping was again performed.  This revealed an area along the anteroseptal margin of the right ventricular outflow tract.  This was a much more septal location than previously established.  In this location, the local activation proceeded the surface QRS by 32 milliseconds and a 12/12 pace map match was observed with unipolar electrogram revealing a large QS morphology. Radiofrequency current was therefore delivered in this location with a target temperature of 50 degrees at 40 watts for 120 seconds with near complete termination of the PVC and nonsustained ventricular tachycardia focus.  The patient was observed with only very rare PVCs thereafter.  I placed the patient on isoproterenol at 2 mcg/minute and additional observation was made.  At this point, I elected to perform EP study. Ventricular pacing was performed from the right ventricular apex, which revealed VA dissociation.  Rapid atrial pacing was performed, which revealed no evidence of PR greater than RR.  The AV Wenckebach cycle length was 320 milliseconds and no arrhythmias were observed.  A series of 3 additional radiofrequency applications were then delivered as bonus burns along the right ventricular outflow tract along the anteroseptal region where the earliest activation had previously been observed.  The patient was observed with no further PVCs thereafter.  The procedure was therefore considered completed.  All catheters were removed and the sheaths were  aspirated and flushed.  The sheaths were removed and hemostasis was assured.  There were no early apparent complications.  CONCLUSIONS: 1. Sinus rhythm with frequent PVCs upon presentation. 2. Successful ablation of nonsustained ventricular tachycardia and     near incessant premature ventricular contractions from the     anteroseptal right ventricular outflow tract. 3. No inducible arrhythmias following ablation both on and off     isoproterenol. 4. No early apparent complications.     Hillis Range, MD     JA/MEDQ  D:  02/09/2013  T:  02/10/2013  Job:  [id number]  cc:   Noralyn Pick. Eden Emms, MD, [facility]

## 2013-02-10 NOTE — Progress Notes (Signed)
D/c orders received;IV removed with gauze on, pt remains in stable condition, pt meds and instructions reviewed and given to pt; pt d/ to home

## 2013-02-10 NOTE — Progress Notes (Signed)
   SUBJECTIVE: The patient is doing well today.  At this time, she denies chest pain, shortness of breath, or any new concerns.  She feels "much better" with ablation of PVCs.  Marland Kitchen aspirin EC  81 mg Oral Daily  . atorvastatin  10 mg Oral Daily  . furosemide  20 mg Oral Daily  . levothyroxine  25 mcg Oral QAC breakfast  . sodium chloride  3 mL Intravenous Q12H  . tiotropium  18 mcg Inhalation Daily      OBJECTIVE: Physical Exam: Filed Vitals:   02/09/13 1830 02/09/13 2000 02/10/13 0610 02/10/13 0800  BP: 114/76 117/69 105/69   Pulse: 68 76 76   Temp:  98.3 F (36.8 C) 98.1 F (36.7 C)   TempSrc:  Oral Oral   Resp:  16 18   Height:      Weight:   228 lb 14.4 oz (103.828 kg)   SpO2: 98% 95% 94% 94%    Intake/Output Summary (Last 24 hours) at 02/10/13 1610 Last data filed at 02/09/13 2256  Gross per 24 hour  Intake    603 ml  Output      0 ml  Net    603 ml    Telemetry reveals sinus rhythm, no PVCs or NSVT  GEN- The patient is well appearing, alert and oriented x 3 today.   Head- normocephalic, atraumatic Eyes-  Sclera clear, conjunctiva pink Ears- hearing intact Oropharynx- clear Neck- supple, no JVP Lymph- no cervical lymphadenopathy Lungs- Clear to ausculation bilaterally, normal work of breathing Heart- Regular rate and rhythm, no murmurs, rubs or gallops, PMI not laterally displaced GI- soft, NT, ND, + BS Extremities- no clubbing, cyanosis, or edema, no hematoma/ bruits Skin- no rash or lesion Psych- euthymic mood, full affect Neuro- strength and sensation are intact RADIOLOGY: Dg Chest 2 View  01/11/2013  *RADIOLOGY REPORT*  Clinical Data: Centralized chest pain and weakness  CHEST - 2 VIEW  Comparison: 12/24/2012; 11/30/2012; 06/12/2012  Findings: Unchanged cardiac silhouette and mediastinal contours with calcified bilateral hilar lymph nodes, the sequela of prior granulomatous infection.  There is unchanged mild diffuse thickening of pulmonary interstitium.   Persistent mild elevation of the right hemidiaphragm.  No focal airspace opacity.  No pleural effusion or pneumothorax.  Unchanged bones.  IMPRESSION: 1.  No acute cardiopulmonary disease. 2.  Sequela of prior granulomatous infection as above.   Original Report Authenticated By: Tacey Ruiz, MD     ASSESSMENT AND PLAN:  Active Problems:   Palpitations   Premature ventricular contraction   1. PVCs/ NSVT Doing very well s/p ablation Stop flecainide Resume toprol  Follow-up with me in 4 weeks  Hillis Range, MD 02/10/2013 8:22 AM

## 2013-02-11 NOTE — Anesthesia Postprocedure Evaluation (Signed)
Anesthesia Post Note  Patient: [name]  Procedure(s) Performed: Procedure(s) (LRB): V-TACH ABLATION (N/A)  Anesthesia type: MAC  Patient location: PACU  Post pain: Pain level controlled and Adequate analgesia  Post assessment: Post-op Vital signs reviewed, Patient's Cardiovascular Status Stable and Respiratory Function Stable  Last Vitals:  Filed Vitals:   02/10/13 0610  BP: 105/69  Pulse: 76  Temp: 36.7 C  Resp: 18    Post vital signs: Reviewed and stable  Level of consciousness: awake, alert  and oriented  Complications: No apparent anesthesia complications

## 2013-02-12 ENCOUNTER — Telehealth: Payer: Self-pay | Admitting: *Deleted

## 2013-02-12 NOTE — Telephone Encounter (Signed)
Called patient to check on her condition s/p VT ablation. Left message if she has any concerns or problems to call us back. Reminded of post hospital appointment with Dr.Allred on 3/26.

## 2013-02-16 ENCOUNTER — Other Ambulatory Visit: Payer: Medicaid Other

## 2013-02-18 ENCOUNTER — Ambulatory Visit: Payer: Medicaid Other | Admitting: Family Medicine

## 2013-02-21 ENCOUNTER — Emergency Department (HOSPITAL_COMMUNITY)
Admission: EM | Admit: 2013-02-21 | Discharge: 2013-02-22 | Disposition: A | Discharge status: Home / Self Care | Payer: Medicaid Other | Attending: Emergency Medicine | Admitting: Emergency Medicine

## 2013-02-21 ENCOUNTER — Encounter (HOSPITAL_COMMUNITY): Payer: Self-pay | Admitting: Emergency Medicine

## 2013-02-21 DIAGNOSIS — E039 Hypothyroidism, unspecified: Secondary | ICD-10-CM | POA: Insufficient documentation

## 2013-02-21 DIAGNOSIS — Z9861 Coronary angioplasty status: Secondary | ICD-10-CM | POA: Insufficient documentation

## 2013-02-21 DIAGNOSIS — Z87891 Personal history of nicotine dependence: Secondary | ICD-10-CM | POA: Insufficient documentation

## 2013-02-21 DIAGNOSIS — I252 Old myocardial infarction: Secondary | ICD-10-CM | POA: Insufficient documentation

## 2013-02-21 DIAGNOSIS — Z559 Problems related to education and literacy, unspecified: Secondary | ICD-10-CM | POA: Insufficient documentation

## 2013-02-21 DIAGNOSIS — Z79899 Other long term (current) drug therapy: Secondary | ICD-10-CM | POA: Insufficient documentation

## 2013-02-21 DIAGNOSIS — I10 Essential (primary) hypertension: Secondary | ICD-10-CM | POA: Insufficient documentation

## 2013-02-21 DIAGNOSIS — M129 Arthropathy, unspecified: Secondary | ICD-10-CM | POA: Insufficient documentation

## 2013-02-21 DIAGNOSIS — Z7982 Long term (current) use of aspirin: Secondary | ICD-10-CM | POA: Insufficient documentation

## 2013-02-21 DIAGNOSIS — Z8709 Personal history of other diseases of the respiratory system: Secondary | ICD-10-CM | POA: Insufficient documentation

## 2013-02-21 DIAGNOSIS — Z8659 Personal history of other mental and behavioral disorders: Secondary | ICD-10-CM | POA: Insufficient documentation

## 2013-02-21 DIAGNOSIS — I5032 Chronic diastolic (congestive) heart failure: Secondary | ICD-10-CM | POA: Insufficient documentation

## 2013-02-21 DIAGNOSIS — J45901 Unspecified asthma with (acute) exacerbation: Secondary | ICD-10-CM | POA: Insufficient documentation

## 2013-02-21 DIAGNOSIS — J441 Chronic obstructive pulmonary disease with (acute) exacerbation: Secondary | ICD-10-CM | POA: Insufficient documentation

## 2013-02-21 DIAGNOSIS — R079 Chest pain, unspecified: Secondary | ICD-10-CM | POA: Insufficient documentation

## 2013-02-21 DIAGNOSIS — R0789 Other chest pain: Secondary | ICD-10-CM

## 2013-02-21 NOTE — ED Notes (Signed)
Per EMS pt is c/o low back pain that started this morning and has progressively gotten worse  Pt denies injury  Pt is also c/o left shoulder pain

## 2013-02-21 NOTE — ED Provider Notes (Signed)
History    This chart was scribed for non-physician practitioner working with Dione Booze, MD by Melba Coon, ED Scribe. This patient was seen in room WTR6/WTR6 and the patient's care was started at 11:50PM.    CSN: [id number]  Arrival date & time 02/21/13  2106   First MD Initiated Contact with Patient 02/21/13 2335      Chief Complaint  Patient presents with  . Back Pain    (Consider location/radiation/quality/duration/timing/severity/associated sxs/prior treatment) The history is provided by the patient. No language interpreter was used.   KENYOTTA DORFMAN is a 57 y.o. female who presents to the Emergency Department complaining of sudden onset right sided posterior chest wall pain that began this morning that has gotten progressively worse throughout the day. She reports associated dyspnea above her baseline.  She denies pleuritic pain or worsening with deep inhalation. She reports a history of COPD and bronchitis. She denies any mechanism of injury. She reports SOB with non-productive cough which aggravates the pain. She reports she has to gag like she has to vomit but she can not do it. She reports she had a heart catheterization about 1.5 weeks ago which showed abnormal dysrhythmias (irregular heartbeat); she reports she takes ASA daily. She also c/o left shoulder pain. Denies history of DVT. Denies HA, fever, neck pain, sore throat, rash, CP, abdominal pain, nausea, emesis, diarrhea, hematuria, dysuria, bowel or bladder dysfunction, or extremity edema, weakness, numbness, or tingling. No IV drug abuse. No other pertinent medical symptoms. Former smoker but quit 3 years ago.  Past Medical History  Diagnosis Date  . Chest pain     a. s/p reported MI in 1997;  b. 09/2012 Cath: nl cors, nl LV ([place]);  c. 05/2012 Non-ischemic myoview.  . Hypertension   . Arthritis   . Problem with literacy   . Shortness of breath   . Anxiety   . Bronchitis   . Chronic headaches   . Obesity   .  COPD (chronic obstructive pulmonary disease)   . Chronic diastolic CHF (congestive heart failure)     a. 12/2011 Echo: nl ef, Gr 1 DD;  b. 11/2012 Echo: EF 45-50%, no significant valvular abnormalities.  Marland Kitchen GERD (gastroesophageal reflux disease)   . Myocardial infarction 1997    self reported  . Hypothyroidism   . Asthma   . Chronic chest pain     Past Surgical History  Procedure Laterality Date  . Ectopic pregnancy surgery      right  . Nm myoview ltd  June 2013    no reversible ischemia  . Cardiac catheterization  October 2012    No coronary artery disease  . Cardiac catheterization  2012    normal coronary arteries  . Nm myoview ltd  2012, 2013    normal    Family History  Problem Relation Age of Onset  . Cancer Mother 52    lung ca  . Diabetes Mother   . Anxiety disorder Sister   . Cancer Brother 60    lung    History  Substance Use Topics  . Smoking status: Former Smoker -- 1.00 packs/day for 3 years    Quit date: 12/03/2010  . Smokeless tobacco: Never Used  . Alcohol Use: No    OB History   Grav Para Term Preterm Abortions TAB SAB Ect Mult Living                  Review of Systems  Musculoskeletal: Positive for back  pain.   10 Systems reviewed and all are negative for acute change except as noted in the HPI.   Allergies  Penicillins  Home Medications   Current Outpatient Rx  Name  Route  Sig  Dispense  Refill  . albuterol (PROVENTIL HFA;VENTOLIN HFA) 108 (90 BASE) MCG/ACT inhaler   Inhalation   Inhale 2 puffs into the lungs every 4 (four) hours as needed for wheezing or shortness of breath (cough).   1 Inhaler   5   . aspirin EC 81 MG tablet   Oral   Take 81 mg by mouth daily.         Marland Kitchen atorvastatin (LIPITOR) 10 MG tablet   Oral   Take 10 mg by mouth daily.         . furosemide (LASIX) 20 MG tablet   Oral   Take 1 tablet (20 mg total) by mouth daily.   30 tablet   6   . levothyroxine (SYNTHROID, LEVOTHROID) 25 MCG tablet    Oral   Take 25 mcg by mouth daily.         . metoprolol succinate (TOPROL-XL) 25 MG 24 hr tablet   Oral   Take 1 tablet (25 mg total) by mouth daily.   30 tablet   0   . tiotropium (SPIRIVA HANDIHALER) 18 MCG inhalation capsule   Inhalation   Place 1 capsule (18 mcg total) into inhaler and inhale daily.   30 capsule   3     BP 107/68  Pulse 71  Temp(Src) 98.3 F (36.8 C) (Oral)  Resp 16  Ht 5\' 8"  (1.727 m)  Wt 220 lb (99.791 kg)  BMI 33.46 kg/m2  SpO2 95%  Physical Exam  Nursing note and vitals reviewed. Constitutional: She is oriented to person, place, and time. She appears well-developed and well-nourished. No distress.  Morbidly obese  HENT:  Head: Normocephalic and atraumatic.  Eyes: Conjunctivae are normal. No scleral icterus.  Neck: Normal range of motion.  Cardiovascular: Normal rate, regular rhythm and normal heart sounds.  Exam reveals no gallop and no friction rub.   No murmur heard. Pulmonary/Chest: Effort normal and breath sounds normal. No respiratory distress.  Abdominal: Soft. Bowel sounds are normal. She exhibits no distension and no mass. There is no tenderness. There is no guarding.  Neurological: She is alert and oriented to person, place, and time.  Skin: Skin is warm and dry. She is not diaphoretic.    ED Course  Procedures (including critical care time)  DIAGNOSTIC STUDIES: Oxygen Saturation is 95% on room air, adequate by my interpretation.    COORDINATION OF CARE:  11:55PM - in order to rule out pulmonary embolus, d dimer will be ordered for Vincente Poli.     Labs Reviewed - No data to display No results found.   No diagnosis found.    MDM  1:16 AM  Paitent's labs are back with elevated D-Dimer.  I wil order CT angio  To r/o PE.  I have given report to PA szekalski who will assume care of the patient and disposition I personally performed the services described in this documentation, which was scribed in my presence.  The recorded information has been reviewed and is accurate.        Arthor Captain, PA-C 02/22/13 0128

## 2013-02-22 ENCOUNTER — Encounter (HOSPITAL_COMMUNITY): Payer: Self-pay

## 2013-02-22 ENCOUNTER — Emergency Department (HOSPITAL_COMMUNITY): Payer: Medicaid Other

## 2013-02-22 LAB — CBC
Hemoglobin: 13.3 g/dL (ref 12.0–15.0)
MCH: 27.7 pg (ref 26.0–34.0)
MCV: 83.6 fL (ref 78.0–100.0)
Platelets: 293 10*3/uL (ref 150–400)
RBC: 4.81 MIL/uL (ref 3.87–5.11)

## 2013-02-22 LAB — URINALYSIS, ROUTINE W REFLEX MICROSCOPIC
Glucose, UA: NEGATIVE mg/dL
Hgb urine dipstick: NEGATIVE
Ketones, ur: NEGATIVE mg/dL
Protein, ur: NEGATIVE mg/dL

## 2013-02-22 LAB — BASIC METABOLIC PANEL
BUN: 16 mg/dL (ref 6–23)
CO2: 23 mEq/L (ref 19–32)
Calcium: 9.1 mg/dL (ref 8.4–10.5)
Creatinine, Ser: 0.77 mg/dL (ref 0.50–1.10)
Glucose, Bld: 101 mg/dL — ABNORMAL HIGH (ref 70–99)

## 2013-02-22 LAB — URINE MICROSCOPIC-ADD ON

## 2013-02-22 MED ORDER — IOHEXOL 300 MG/ML  SOLN: 100.0000 mL | Freq: Once | INTRAMUSCULAR | Status: DC | PRN

## 2013-02-22 MED ORDER — IOHEXOL 350 MG/ML SOLN
100.0000 mL | Freq: Once | INTRAVENOUS | Status: AC | PRN
  Administered 2013-02-22: 100 mL via INTRAVENOUS

## 2013-02-22 MED ORDER — MORPHINE SULFATE 4 MG/ML IJ SOLN
4.0000 mg | Freq: Once | INTRAMUSCULAR | Status: AC
  Administered 2013-02-22: 4 mg via INTRAVENOUS
  Filled 2013-02-22: qty 1

## 2013-02-22 NOTE — ED Provider Notes (Signed)
Pt signed out to my by Emilia Beck, PA-C waiting for CT angio which was negative for PE.  At this time there does not appear to be any evidence of an acute emergency medical condition and the patient appears stable for discharge with appropriate outpatient follow up.Diagnosis was discussed with patient who verbalizes understanding and is agreeable to discharge.   Glade Nurse, PA-C 02/22/13 [phone]

## 2013-02-22 NOTE — ED Provider Notes (Signed)
1:22 AM Patient signed out to me by Arthor Captain, PA-C. Patient pending CT chest to rule out PE.   6:28 AM Patient signed out to Glade Nurse, PA-C.   Emilia Beck, PA-C 02/23/13 1013

## 2013-02-22 NOTE — ED Provider Notes (Signed)
Medical screening examination/treatment/procedure(s) were performed by non-physician practitioner and as supervising physician I was immediately available for consultation/collaboration.    Vida Roller, MD 02/22/13 [phone]

## 2013-02-22 NOTE — ED Notes (Signed)
Pt reports left upper back/chest pain that began this morning - pt denies any injury - states she has COPD and is normally mildly short of breath - breath sounds clear and equal bilat, speaking complete sentences and in no acute distress.

## 2013-02-22 NOTE — ED Provider Notes (Deleted)
MSE was initiated and I personally evaluated the patient and placed orders (if any) at  1:01 AM on February 22, 2013.  The patient appears stable so that the remainder of the MSE may be completed by another [name].   [name] is a 56 y.o who complains of sudden onset posterior chest wall pain and SOB that began this morning and has gotten progressively worse. The patient has a significant past medical history and I feel the patient may need PE rule ourt  She reports she had a heart catheterization about 1.5 weeks ago which showed abnormal dysrhythmias (irregular heartbeat); she reports she takes ASA daily. She also c/o left shoulder pain.   Denies history of DVT. Denies HA, fever, neck pain, sore throat, rash, CP, abdominal pain, nausea, emesis, diarrhea, hematuria, dysuria, bowel or bladder dysfunction, or extremity edema, weakness, numbness, or tingling. No IV drug abuse. No other pertinent medical symptoms. Former smoker but quit 3 years ago.   Arthor Captain, PA-C 02/22/13 0107

## 2013-02-23 ENCOUNTER — Other Ambulatory Visit: Payer: Medicaid Other

## 2013-02-23 NOTE — ED Provider Notes (Signed)
Medical screening examination/treatment/procedure(s) were performed by non-physician practitioner and as supervising physician I was immediately available for consultation/collaboration.  Dechelle Attaway-Adam Bonk, M.D.      Tishina Lown-Adam Bonk, MD 02/23/13 0745 

## 2013-02-23 NOTE — ED Provider Notes (Signed)
Medical screening examination/treatment/procedure(s) were performed by non-physician practitioner and as supervising physician I was immediately available for consultation/collaboration.  Jones Skene, M.D.     Jones Skene, MD 02/23/13 1341

## 2013-02-24 ENCOUNTER — Encounter: Payer: Self-pay | Admitting: Family Medicine

## 2013-02-24 DIAGNOSIS — R9389 Abnormal findings on diagnostic imaging of other specified body structures: Secondary | ICD-10-CM | POA: Insufficient documentation

## 2013-03-01 ENCOUNTER — Telehealth: Payer: Self-pay | Admitting: Family Medicine

## 2013-03-01 NOTE — Telephone Encounter (Signed)
Patient was calling for refills on her medications of which she didn't know the names of.  I let her know that all refill requests must come through the pharmacy and asked her to call her pharmacy.

## 2013-03-10 ENCOUNTER — Ambulatory Visit (INDEPENDENT_AMBULATORY_CARE_PROVIDER_SITE_OTHER): Payer: Medicaid Other | Admitting: Internal Medicine

## 2013-03-10 ENCOUNTER — Encounter: Payer: Self-pay | Admitting: Internal Medicine

## 2013-03-10 VITALS — BP 120/84 | HR 93 | Ht 68.0 in | Wt 231.0 lb

## 2013-03-10 DIAGNOSIS — I4949 Other premature depolarization: Secondary | ICD-10-CM

## 2013-03-10 DIAGNOSIS — I493 Ventricular premature depolarization: Secondary | ICD-10-CM

## 2013-03-10 NOTE — Patient Instructions (Signed)
Your physician recommends that you schedule a follow-up appointment in: 3 months with Dr Johney Frame   Your physician has recommended that you wear a holter monitor. Holter monitors are medical devices that record the heart's electrical activity. Doctors most often use these monitors to diagnose arrhythmias. Arrhythmias are problems with the speed or rhythm of the heartbeat. The monitor is a small, portable device. You can wear one while you do your normal daily activities. This is usually used to diagnose what is causing palpitations/syncope (passing out).---24 hour

## 2013-03-10 NOTE — Progress Notes (Signed)
PCP: Delbert Harness, MD Primary Cardiologist:  Dr Malachi Paradise [name] is a 57 y.o. female who presents today for routine electrophysiology followup.  Since her recent VT ablation, the patient reports doing very well.  She denies any further symptoms of PVCs or VT.  She is pleased with the results of her procedure and denies procedure related complications.  Today, she denies symptoms of palpitations, chest pain, shortness of breath,  lower extremity edema, dizziness, presyncope, or syncope.  The patient is otherwise without complaint today.   Past Medical History  Diagnosis Date  . Chest pain     a. s/p reported MI in 1997;  b. 09/2012 Cath: nl cors, nl LV ([place]);  c. 05/2012 Non-ischemic myoview.  . Hypertension   . Arthritis   . Problem with literacy   . Shortness of breath   . Anxiety   . Bronchitis   . Chronic headaches   . Obesity   . COPD (chronic obstructive pulmonary disease)   . Chronic diastolic CHF (congestive heart failure)     a. 12/2011 Echo: nl ef, Gr 1 DD;  b. 11/2012 Echo: EF 45-50%, no significant valvular abnormalities.  Marland Kitchen GERD (gastroesophageal reflux disease)   . Myocardial infarction 1997    self reported  . Hypothyroidism   . Asthma   . Chronic chest pain    Past Surgical History  Procedure Laterality Date  . Ectopic pregnancy surgery      right  . Nm myoview ltd  June 2013    no reversible ischemia  . Cardiac catheterization  October 2012    No coronary artery disease  . Cardiac catheterization  2012    normal coronary arteries  . Nm myoview ltd  2012, 2013    normal  . Ep study and ablation for vt  02/10/13    RVOT VT ablated by Dr Johney Frame    Current Outpatient Prescriptions  Medication Sig Dispense Refill  . albuterol (PROVENTIL HFA;VENTOLIN HFA) 108 (90 BASE) MCG/ACT inhaler Inhale 2 puffs into the lungs every 4 (four) hours as needed for wheezing or shortness of breath (cough).  1 Inhaler  5  . aspirin EC 81 MG tablet Take 81 mg by mouth  daily.      Marland Kitchen atorvastatin (LIPITOR) 10 MG tablet Take 10 mg by mouth daily.      . furosemide (LASIX) 20 MG tablet Take 1 tablet (20 mg total) by mouth daily.  30 tablet  6  . levothyroxine (SYNTHROID, LEVOTHROID) 25 MCG tablet Take 25 mcg by mouth daily.      . metoprolol succinate (TOPROL-XL) 25 MG 24 hr tablet Take 1 tablet (25 mg total) by mouth daily.  30 tablet  0  . tiotropium (SPIRIVA HANDIHALER) 18 MCG inhalation capsule Place 1 capsule (18 mcg total) into inhaler and inhale daily.  30 capsule  3   No current facility-administered medications for this visit.    Physical Exam: Filed Vitals:   03/10/13 1547  BP: 120/84  Pulse: 93  Height: 5\' 8"  (1.727 m)  Weight: 231 lb (104.781 kg)  SpO2: 99%    GEN- The patient is well appearing, alert and oriented x 3 today.   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

## 2013-03-11 ENCOUNTER — Encounter (HOSPITAL_COMMUNITY): Payer: Self-pay | Admitting: Emergency Medicine

## 2013-03-11 ENCOUNTER — Emergency Department (HOSPITAL_COMMUNITY)
Admission: EM | Admit: 2013-03-11 | Discharge: 2013-03-11 | Disposition: A | Discharge status: Home / Self Care | Payer: Medicaid Other | Attending: Emergency Medicine | Admitting: Emergency Medicine

## 2013-03-11 DIAGNOSIS — Z559 Problems related to education and literacy, unspecified: Secondary | ICD-10-CM | POA: Insufficient documentation

## 2013-03-11 DIAGNOSIS — I10 Essential (primary) hypertension: Secondary | ICD-10-CM | POA: Insufficient documentation

## 2013-03-11 DIAGNOSIS — Z79899 Other long term (current) drug therapy: Secondary | ICD-10-CM | POA: Insufficient documentation

## 2013-03-11 DIAGNOSIS — Z8659 Personal history of other mental and behavioral disorders: Secondary | ICD-10-CM | POA: Insufficient documentation

## 2013-03-11 DIAGNOSIS — I252 Old myocardial infarction: Secondary | ICD-10-CM | POA: Insufficient documentation

## 2013-03-11 DIAGNOSIS — E039 Hypothyroidism, unspecified: Secondary | ICD-10-CM | POA: Insufficient documentation

## 2013-03-11 DIAGNOSIS — J449 Chronic obstructive pulmonary disease, unspecified: Secondary | ICD-10-CM | POA: Insufficient documentation

## 2013-03-11 DIAGNOSIS — M129 Arthropathy, unspecified: Secondary | ICD-10-CM | POA: Insufficient documentation

## 2013-03-11 DIAGNOSIS — Y929 Unspecified place or not applicable: Secondary | ICD-10-CM | POA: Insufficient documentation

## 2013-03-11 DIAGNOSIS — Z87891 Personal history of nicotine dependence: Secondary | ICD-10-CM | POA: Insufficient documentation

## 2013-03-11 DIAGNOSIS — S239XXA Sprain of unspecified parts of thorax, initial encounter: Secondary | ICD-10-CM | POA: Insufficient documentation

## 2013-03-11 DIAGNOSIS — T148XXA Other injury of unspecified body region, initial encounter: Secondary | ICD-10-CM

## 2013-03-11 DIAGNOSIS — J4489 Other specified chronic obstructive pulmonary disease: Secondary | ICD-10-CM | POA: Insufficient documentation

## 2013-03-11 DIAGNOSIS — E669 Obesity, unspecified: Secondary | ICD-10-CM | POA: Insufficient documentation

## 2013-03-11 DIAGNOSIS — Z8719 Personal history of other diseases of the digestive system: Secondary | ICD-10-CM | POA: Insufficient documentation

## 2013-03-11 DIAGNOSIS — Z8679 Personal history of other diseases of the circulatory system: Secondary | ICD-10-CM | POA: Insufficient documentation

## 2013-03-11 DIAGNOSIS — X58XXXA Exposure to other specified factors, initial encounter: Secondary | ICD-10-CM | POA: Insufficient documentation

## 2013-03-11 DIAGNOSIS — Z8709 Personal history of other diseases of the respiratory system: Secondary | ICD-10-CM | POA: Insufficient documentation

## 2013-03-11 DIAGNOSIS — Y939 Activity, unspecified: Secondary | ICD-10-CM | POA: Insufficient documentation

## 2013-03-11 DIAGNOSIS — I5032 Chronic diastolic (congestive) heart failure: Secondary | ICD-10-CM | POA: Insufficient documentation

## 2013-03-11 DIAGNOSIS — Z7982 Long term (current) use of aspirin: Secondary | ICD-10-CM | POA: Insufficient documentation

## 2013-03-11 DIAGNOSIS — Z87828 Personal history of other (healed) physical injury and trauma: Secondary | ICD-10-CM | POA: Insufficient documentation

## 2013-03-11 LAB — CBC WITH DIFFERENTIAL/PLATELET
Basophils Absolute: 0 10*3/uL (ref 0.0–0.1)
Basophils Relative: 1 % (ref 0–1)
Eosinophils Absolute: 0.3 10*3/uL (ref 0.0–0.7)
Hemoglobin: 13.2 g/dL (ref 12.0–15.0)
MCH: 27.7 pg (ref 26.0–34.0)
MCHC: 33.9 g/dL (ref 30.0–36.0)
Monocytes Relative: 9 % (ref 3–12)
Neutro Abs: 2.9 10*3/uL (ref 1.7–7.7)
Neutrophils Relative %: 51 % (ref 43–77)
Platelets: 243 10*3/uL (ref 150–400)
RDW: 16.1 % — ABNORMAL HIGH (ref 11.5–15.5)

## 2013-03-11 LAB — URINALYSIS, ROUTINE W REFLEX MICROSCOPIC
Bilirubin Urine: NEGATIVE
Nitrite: NEGATIVE
Specific Gravity, Urine: 1.015 (ref 1.005–1.030)
Urobilinogen, UA: 0.2 mg/dL (ref 0.0–1.0)
pH: 6 (ref 5.0–8.0)

## 2013-03-11 LAB — URINE MICROSCOPIC-ADD ON

## 2013-03-11 LAB — BASIC METABOLIC PANEL
Chloride: 103 mEq/L (ref 96–112)
GFR calc Af Amer: 88 mL/min — ABNORMAL LOW (ref >90)
GFR calc non Af Amer: 76 mL/min — ABNORMAL LOW (ref >90)
Potassium: 3.9 mEq/L (ref 3.5–5.1)
Sodium: 139 mEq/L (ref 135–145)

## 2013-03-11 MED ORDER — CYCLOBENZAPRINE HCL 10 MG PO TABS: 10.0000 mg | ORAL_TABLET | Freq: Two times a day (BID) | ORAL | Status: DC | PRN

## 2013-03-11 MED ORDER — KETOROLAC TROMETHAMINE 60 MG/2ML IM SOLN
60.0000 mg | Freq: Once | INTRAMUSCULAR | Status: AC
  Administered 2013-03-11: 60 mg via INTRAMUSCULAR
  Filled 2013-03-11: qty 2

## 2013-03-11 MED ORDER — DIAZEPAM 5 MG PO TABS
5.0000 mg | ORAL_TABLET | Freq: Once | ORAL | Status: AC
  Administered 2013-03-11: 5 mg via ORAL
  Filled 2013-03-11: qty 1

## 2013-03-11 MED ORDER — TRAMADOL HCL 50 MG PO TABS: 50.0000 mg | ORAL_TABLET | Freq: Four times a day (QID) | ORAL | Status: DC | PRN

## 2013-03-11 NOTE — ED Notes (Signed)
Back pain started yesterday gotten worse. Denies injury or dysuria

## 2013-03-11 NOTE — ED Provider Notes (Signed)
History     CSN: [id number]  Arrival date & time 03/11/13  4098   First MD Initiated Contact with Patient 03/11/13 [phone]      Chief Complaint  Patient presents with  . Back Pain    (Consider location/radiation/quality/duration/timing/severity/associated sxs/prior treatment) HPI Comments: Patient is a 57 year old female who presents with gradual onset of mid back pain that started yesterday. The pain is aching and severe and does not radiate. The pain is constant. Movement makes the pain worse. Nothing makes the pain better. Patient reports a history of a back injury in the same area and has occasional aching pain, which feels like this. Patient has not tried anything for pain. No associated symptoms. Patient denies fever, chest pain, SOB, dysuria, abdominal pain. No saddles paresthesias or bladder/bowel incontinence.      Past Medical History  Diagnosis Date  . Chest pain     a. s/p reported MI in 1997;  b. 09/2012 Cath: nl cors, nl LV ([place]);  c. 05/2012 Non-ischemic myoview.  . Hypertension   . Arthritis   . Problem with literacy   . Shortness of breath   . Anxiety   . Bronchitis   . Chronic headaches   . Obesity   . COPD (chronic obstructive pulmonary disease)   . Chronic diastolic CHF (congestive heart failure)     a. 12/2011 Echo: nl ef, Gr 1 DD;  b. 11/2012 Echo: EF 45-50%, no significant valvular abnormalities.  Marland Kitchen GERD (gastroesophageal reflux disease)   . Myocardial infarction 1997    self reported  . Hypothyroidism   . Asthma   . Chronic chest pain     Past Surgical History  Procedure Laterality Date  . Ectopic pregnancy surgery      right  . Nm myoview ltd  June 2013    no reversible ischemia  . Cardiac catheterization  October 2012    No coronary artery disease  . Cardiac catheterization  2012    normal coronary arteries  . Nm myoview ltd  2012, 2013    normal  . Ep study and ablation for vt  02/10/13    RVOT VT ablated by Dr Johney Frame    Family  History  Problem Relation Age of Onset  . Cancer Mother 57    lung ca  . Diabetes Mother   . Anxiety disorder Sister   . Cancer Brother 60    lung    History  Substance Use Topics  . Smoking status: Former Smoker -- 1.00 packs/day for 3 years    Quit date: 12/03/2010  . Smokeless tobacco: Never Used  . Alcohol Use: No    OB History   Grav Para Term Preterm Abortions TAB SAB Ect Mult Living                  Review of Systems  Musculoskeletal: Positive for back pain.  All other systems reviewed and are negative.    Allergies  Penicillins  Home Medications   Current Outpatient Rx  Name  Route  Sig  Dispense  Refill  . albuterol (PROVENTIL HFA;VENTOLIN HFA) 108 (90 BASE) MCG/ACT inhaler   Inhalation   Inhale 2 puffs into the lungs every 4 (four) hours as needed for wheezing or shortness of breath (cough).   1 Inhaler   5   . aspirin EC 81 MG tablet   Oral   Take 81 mg by mouth daily.         Marland Kitchen atorvastatin (  LIPITOR) 10 MG tablet   Oral   Take 10 mg by mouth daily.         . furosemide (LASIX) 20 MG tablet   Oral   Take 1 tablet (20 mg total) by mouth daily.   30 tablet   6   . levothyroxine (SYNTHROID, LEVOTHROID) 25 MCG tablet   Oral   Take 25 mcg by mouth daily.         . metoprolol succinate (TOPROL-XL) 25 MG 24 hr tablet   Oral   Take 1 tablet (25 mg total) by mouth daily.   30 tablet   0   . tiotropium (SPIRIVA HANDIHALER) 18 MCG inhalation capsule   Inhalation   Place 1 capsule (18 mcg total) into inhaler and inhale daily.   30 capsule   3     BP 95/67  Pulse 75  Temp(Src) 98 F (36.7 C)  Resp 16  Physical Exam  Nursing note and vitals reviewed. Constitutional: She is oriented to person, place, and time. She appears well-developed and well-nourished. No distress.  HENT:  Head: Normocephalic and atraumatic.  Eyes: Conjunctivae and EOM are normal.  Neck: Normal range of motion. Neck supple.  Cardiovascular: Normal rate and  regular rhythm.  Exam reveals no gallop and no friction rub.   No murmur heard. Pulmonary/Chest: Effort normal and breath sounds normal. She has no wheezes. She has no rales. She exhibits no tenderness.  Abdominal: Soft. She exhibits no distension. There is no tenderness. There is no rebound and no guarding.  Genitourinary:  Left CVA tenderness.   Musculoskeletal: Normal range of motion.  No midline spine tenderness to palpation.   Neurological: She is alert and oriented to person, place, and time. Coordination normal.  Extremity strength and sensation equal and intact bilaterally. Speech is goal-oriented. Moves limbs without ataxia.   Skin: Skin is warm and dry.  Psychiatric: She has a normal mood and affect. Her behavior is normal.    ED Course  Procedures (including critical care time)  Labs Reviewed  CBC WITH DIFFERENTIAL - Abnormal; Notable for the following:    RDW 16.1 (*)    All other components within normal limits  BASIC METABOLIC PANEL - Abnormal; Notable for the following:    GFR calc non Af Amer 76 (*)    GFR calc Af Amer 88 (*)    All other components within normal limits  URINALYSIS, ROUTINE W REFLEX MICROSCOPIC - Abnormal; Notable for the following:    Leukocytes, UA SMALL (*)    All other components within normal limits  URINE CULTURE  URINE MICROSCOPIC-ADD ON   No results found.   1. Musculoskeletal strain       MDM  9:48 AM Labs and urinalysis pending. Patient will have toradol and valium for pain.   11:13 AM Urinalysis and labs unremarkable for any acute changes or signs of infection. Patient likely experiencing musculoskeletal pain from previous injury. Patient will be discharged with Tramadol and Flexeril for pain. Vitals stable and patient afebrile at this time. Patient instructed to return with worsening or concerning symptoms.         Emilia Beck, PA-C 03/12/13 1902

## 2013-03-12 LAB — URINE CULTURE: Special Requests: NORMAL

## 2013-03-13 NOTE — ED Provider Notes (Signed)
Medical screening examination/treatment/procedure(s) were performed by non-physician practitioner and as supervising physician I was immediately available for consultation/collaboration.  Derwood Kaplan, MD 03/13/13 2109

## 2013-03-15 ENCOUNTER — Ambulatory Visit (INDEPENDENT_AMBULATORY_CARE_PROVIDER_SITE_OTHER): Payer: Medicaid Other

## 2013-03-15 DIAGNOSIS — I4949 Other premature depolarization: Secondary | ICD-10-CM

## 2013-03-15 DIAGNOSIS — I493 Ventricular premature depolarization: Secondary | ICD-10-CM

## 2013-03-15 NOTE — Progress Notes (Signed)
Placed the patient on a 24 hr holter monitor and went over instructions on how to use it and when to return it

## 2013-03-16 ENCOUNTER — Other Ambulatory Visit (HOSPITAL_COMMUNITY): Payer: Self-pay | Admitting: Family Medicine

## 2013-03-18 ENCOUNTER — Ambulatory Visit: Payer: Medicaid Other | Admitting: Family Medicine

## 2013-04-08 ENCOUNTER — Encounter (HOSPITAL_COMMUNITY): Payer: Self-pay | Admitting: *Deleted

## 2013-04-08 ENCOUNTER — Emergency Department (HOSPITAL_COMMUNITY)
Admission: EM | Admit: 2013-04-08 | Discharge: 2013-04-08 | Disposition: A | Discharge status: Home / Self Care | Payer: Medicaid Other | Attending: Emergency Medicine | Admitting: Emergency Medicine

## 2013-04-08 DIAGNOSIS — I10 Essential (primary) hypertension: Secondary | ICD-10-CM | POA: Insufficient documentation

## 2013-04-08 DIAGNOSIS — M549 Dorsalgia, unspecified: Secondary | ICD-10-CM

## 2013-04-08 DIAGNOSIS — Z880 Allergy status to penicillin: Secondary | ICD-10-CM | POA: Insufficient documentation

## 2013-04-08 DIAGNOSIS — M129 Arthropathy, unspecified: Secondary | ICD-10-CM | POA: Insufficient documentation

## 2013-04-08 DIAGNOSIS — R079 Chest pain, unspecified: Secondary | ICD-10-CM | POA: Insufficient documentation

## 2013-04-08 DIAGNOSIS — Z862 Personal history of diseases of the blood and blood-forming organs and certain disorders involving the immune mechanism: Secondary | ICD-10-CM | POA: Insufficient documentation

## 2013-04-08 DIAGNOSIS — Z8719 Personal history of other diseases of the digestive system: Secondary | ICD-10-CM | POA: Insufficient documentation

## 2013-04-08 DIAGNOSIS — M545 Low back pain, unspecified: Secondary | ICD-10-CM | POA: Insufficient documentation

## 2013-04-08 DIAGNOSIS — Z79899 Other long term (current) drug therapy: Secondary | ICD-10-CM | POA: Insufficient documentation

## 2013-04-08 DIAGNOSIS — I5032 Chronic diastolic (congestive) heart failure: Secondary | ICD-10-CM | POA: Insufficient documentation

## 2013-04-08 DIAGNOSIS — J4489 Other specified chronic obstructive pulmonary disease: Secondary | ICD-10-CM | POA: Insufficient documentation

## 2013-04-08 DIAGNOSIS — Z9861 Coronary angioplasty status: Secondary | ICD-10-CM | POA: Insufficient documentation

## 2013-04-08 DIAGNOSIS — Z8659 Personal history of other mental and behavioral disorders: Secondary | ICD-10-CM | POA: Insufficient documentation

## 2013-04-08 DIAGNOSIS — R109 Unspecified abdominal pain: Secondary | ICD-10-CM

## 2013-04-08 DIAGNOSIS — J449 Chronic obstructive pulmonary disease, unspecified: Secondary | ICD-10-CM | POA: Insufficient documentation

## 2013-04-08 DIAGNOSIS — Z87891 Personal history of nicotine dependence: Secondary | ICD-10-CM | POA: Insufficient documentation

## 2013-04-08 DIAGNOSIS — Z8679 Personal history of other diseases of the circulatory system: Secondary | ICD-10-CM | POA: Insufficient documentation

## 2013-04-08 DIAGNOSIS — Z8639 Personal history of other endocrine, nutritional and metabolic disease: Secondary | ICD-10-CM | POA: Insufficient documentation

## 2013-04-08 DIAGNOSIS — Z7982 Long term (current) use of aspirin: Secondary | ICD-10-CM | POA: Insufficient documentation

## 2013-04-08 DIAGNOSIS — E669 Obesity, unspecified: Secondary | ICD-10-CM | POA: Insufficient documentation

## 2013-04-08 DIAGNOSIS — Z559 Problems related to education and literacy, unspecified: Secondary | ICD-10-CM | POA: Insufficient documentation

## 2013-04-08 DIAGNOSIS — G8929 Other chronic pain: Secondary | ICD-10-CM | POA: Insufficient documentation

## 2013-04-08 LAB — URINALYSIS, ROUTINE W REFLEX MICROSCOPIC
Bilirubin Urine: NEGATIVE
Ketones, ur: NEGATIVE mg/dL
Nitrite: NEGATIVE
Specific Gravity, Urine: 1.006 (ref 1.005–1.030)
Urobilinogen, UA: 0.2 mg/dL (ref 0.0–1.0)
pH: 6 (ref 5.0–8.0)

## 2013-04-08 LAB — CBC WITH DIFFERENTIAL/PLATELET
Basophils Absolute: 0.1 10*3/uL (ref 0.0–0.1)
Basophils Relative: 1 % (ref 0–1)
Eosinophils Absolute: 0.3 10*3/uL (ref 0.0–0.7)
MCH: 28.7 pg (ref 26.0–34.0)
MCHC: 34.7 g/dL (ref 30.0–36.0)
Neutro Abs: 4.9 10*3/uL (ref 1.7–7.7)
Neutrophils Relative %: 60 % (ref 43–77)
Platelets: 283 10*3/uL (ref 150–400)
RBC: 4.67 MIL/uL (ref 3.87–5.11)

## 2013-04-08 LAB — COMPREHENSIVE METABOLIC PANEL
ALT: 16 U/L (ref 0–35)
AST: 26 U/L (ref 0–37)
Albumin: 4.4 g/dL (ref 3.5–5.2)
Alkaline Phosphatase: 112 U/L (ref 39–117)
Chloride: 99 mEq/L (ref 96–112)
Potassium: 3.5 mEq/L (ref 3.5–5.1)
Sodium: 136 mEq/L (ref 135–145)
Total Protein: 8.2 g/dL (ref 6.0–8.3)

## 2013-04-08 LAB — URINE MICROSCOPIC-ADD ON

## 2013-04-08 MED ORDER — NAPROXEN 500 MG PO TABS: 500.0000 mg | ORAL_TABLET | Freq: Two times a day (BID) | ORAL | Status: DC

## 2013-04-08 MED ORDER — OXYCODONE-ACETAMINOPHEN 5-325 MG PO TABS
2.0000 | ORAL_TABLET | Freq: Once | ORAL | Status: AC
  Administered 2013-04-08: 2 via ORAL
  Filled 2013-04-08: qty 2

## 2013-04-08 MED ORDER — OXYCODONE-ACETAMINOPHEN 5-325 MG PO TABS: 1.0000 | ORAL_TABLET | ORAL | Status: DC | PRN

## 2013-04-08 NOTE — ED Provider Notes (Signed)
History     CSN: [id number]  Arrival date & time 04/08/13  1716   First MD Initiated Contact with Patient 04/08/13 1830      Chief Complaint  Patient presents with  . multiple complaints     (Consider location/radiation/quality/duration/timing/severity/associated sxs/prior treatment) HPI Comments: 57 year old female with a history of COPD, diastolic congestive heart failure, last echocardiogram with ejection fraction of 50%, presents with a complaint of lower right back pain as well as lower right abdominal pain. This started in the last 24-48 hours, has been intermittent lasting 1-2 minutes at a time, spontaneous, nothing seems to make it better or worse, not associated with movement, not associated with dysuria diarrhea fevers chills nausea or vomiting. She has no history of abdominal pain, has had a normal appetite. The symptoms are sharp and stabbing, seem to radiate from the right flank to the right lower quadrant.  The history is provided by the patient.    Past Medical History  Diagnosis Date  . Chest pain     a. s/p reported MI in 1997;  b. 09/2012 Cath: nl cors, nl LV ([place]);  c. 05/2012 Non-ischemic myoview.  . Hypertension   . Arthritis   . Problem with literacy   . Shortness of breath   . Anxiety   . Bronchitis   . Chronic headaches   . Obesity   . COPD (chronic obstructive pulmonary disease)   . Chronic diastolic CHF (congestive heart failure)     a. 12/2011 Echo: nl ef, Gr 1 DD;  b. 11/2012 Echo: EF 45-50%, no significant valvular abnormalities.  Marland Kitchen GERD (gastroesophageal reflux disease)   . Myocardial infarction 1997    self reported  . Hypothyroidism   . Asthma   . Chronic chest pain     Past Surgical History  Procedure Laterality Date  . Ectopic pregnancy surgery      right  . Nm myoview ltd  June 2013    no reversible ischemia  . Cardiac catheterization  October 2012    No coronary artery disease  . Cardiac catheterization  2012    normal coronary  arteries  . Nm myoview ltd  2012, 2013    normal  . 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 Cancer Mother 54    lung ca  . Diabetes Mother   . Anxiety disorder Sister   . Cancer Brother 60    lung    History  Substance Use Topics  . Smoking status: Former Smoker -- 1.00 packs/day for 3 years    Quit date: 12/03/2010  . Smokeless tobacco: Never Used  . Alcohol Use: No    OB History   Grav Para Term Preterm Abortions TAB SAB Ect Mult Living                  Review of Systems  All other systems reviewed and are negative.    Allergies  Penicillins  Home Medications   Current Outpatient Rx  Name  Route  Sig  Dispense  Refill  . albuterol (PROVENTIL HFA;VENTOLIN HFA) 108 (90 BASE) MCG/ACT inhaler   Inhalation   Inhale 2 puffs into the lungs every 4 (four) hours as needed for wheezing or shortness of breath (cough).   1 Inhaler   5   . aspirin EC 81 MG tablet   Oral   Take  81 mg by mouth daily.         Marland Kitchen atorvastatin (LIPITOR) 10 MG tablet   Oral   Take 10 mg by mouth daily.         . cyclobenzaprine (FLEXERIL) 10 MG tablet   Oral   Take 10 mg by mouth 2 (two) times daily as needed for muscle spasms.         . furosemide (LASIX) 20 MG tablet   Oral   Take 1 tablet (20 mg total) by mouth daily.   30 tablet   6   . naproxen (NAPROSYN) 500 MG tablet   Oral   Take 1 tablet (500 mg total) by mouth 2 (two) times daily with a meal.   30 tablet   0   . oxyCODONE-acetaminophen (PERCOCET) 5-325 MG per tablet   Oral   Take 1 tablet by mouth every 4 (four) hours as needed for pain.   20 tablet   0     BP 143/83  Pulse 84  Temp(Src) 98.2 F (36.8 C)  Resp 20  SpO2 98%  Physical Exam  Nursing note and vitals reviewed. Constitutional: She appears well-developed and well-nourished. No distress.  HENT:  Head: Normocephalic and atraumatic.  Mouth/Throat:  Oropharynx is clear and moist. No oropharyngeal exudate.  Eyes: Conjunctivae and EOM are normal. Pupils are equal, round, and reactive to light. Right eye exhibits no discharge. Left eye exhibits no discharge. No scleral icterus.  Neck: Normal range of motion. Neck supple. No JVD present. No thyromegaly present.  Cardiovascular: Normal rate, regular rhythm, normal heart sounds and intact distal pulses.  Exam reveals no gallop and no friction rub.   No murmur heard. Pulmonary/Chest: Effort normal and breath sounds normal. No respiratory distress. She has no wheezes. She has no rales.  Abdominal: Soft. Bowel sounds are normal. She exhibits no distension and no mass. There is tenderness ( Minimal tenderness on deep palpation).  No rebound, no guarding, no pain at McBurney's point, no right upper quadrant tenderness, no Murphy sign, no peritoneal signs  Musculoskeletal: Normal range of motion. She exhibits tenderness ( Mild tenderness over the right CVA area, lower ribs, or spinal muscles. No cervical thoracic or lumbar spinal tenderness). She exhibits no edema.  Lymphadenopathy:    She has no cervical adenopathy.  Neurological: She is alert. Coordination normal.  Skin: Skin is warm and dry. No rash noted. No erythema.  Psychiatric: She has a normal mood and affect. Her behavior is normal.    ED Course  Procedures (including critical care time)  Labs Reviewed  URINALYSIS, ROUTINE W REFLEX MICROSCOPIC - Abnormal; Notable for the following:    Leukocytes, UA SMALL (*)    All other components within normal limits  COMPREHENSIVE METABOLIC PANEL - Abnormal; Notable for the following:    GFR calc non Af Amer 71 (*)    GFR calc Af Amer 82 (*)    All other components within normal limits  CBC WITH DIFFERENTIAL  URINE MICROSCOPIC-ADD ON   No results found.   1. Back pain   2. Abdominal pain       MDM  Overall the patient appears very well, she has intermittent pain, no leukocytosis and no  urinary symptoms. This is not consistent with appendicitis, she does not have a surgical abdomen, this could possibly be a kidney stone. I have recommended that the patient use the pain medications as prescribed, anti-inflammatories and followup in one to 2 days with family Dr.  for recheck. I do not think that CT scan imaging at this time is indicated, the patient is in agreement with the discharge plan.  Meds given in ED:  Medications  oxyCODONE-acetaminophen (PERCOCET/ROXICET) 5-325 MG per tablet 2 tablet (not administered)    New Prescriptions   NAPROXEN (NAPROSYN) 500 MG TABLET    Take 1 tablet (500 mg total) by mouth 2 (two) times daily with a meal.   OXYCODONE-ACETAMINOPHEN (PERCOCET) 5-325 MG PER TABLET    Take 1 tablet by mouth every 4 (four) hours as needed for pain.            Vida Roller, MD 04/08/13 Mikle Bosworth

## 2013-04-08 NOTE — ED Notes (Signed)
The pt is c/o lower back pain more on the lt than rt.  Rt lower abd pain and some rt lower rib pain for 3-4 days.  No known injury no cold cough .  Headache also

## 2013-04-13 ENCOUNTER — Encounter: Payer: Self-pay | Admitting: Family Medicine

## 2013-04-13 ENCOUNTER — Ambulatory Visit (INDEPENDENT_AMBULATORY_CARE_PROVIDER_SITE_OTHER): Payer: Self-pay | Admitting: Family Medicine

## 2013-04-13 VITALS — BP 133/87 | HR 71 | Temp 97.8°F | Ht 68.0 in | Wt 234.5 lb

## 2013-04-13 DIAGNOSIS — F411 Generalized anxiety disorder: Secondary | ICD-10-CM

## 2013-04-13 DIAGNOSIS — E039 Hypothyroidism, unspecified: Secondary | ICD-10-CM

## 2013-04-13 DIAGNOSIS — R002 Palpitations: Secondary | ICD-10-CM

## 2013-04-13 DIAGNOSIS — R0609 Other forms of dyspnea: Secondary | ICD-10-CM

## 2013-04-13 DIAGNOSIS — F419 Anxiety disorder, unspecified: Secondary | ICD-10-CM

## 2013-04-13 DIAGNOSIS — R69 Illness, unspecified: Secondary | ICD-10-CM

## 2013-04-13 DIAGNOSIS — R0600 Dyspnea, unspecified: Secondary | ICD-10-CM

## 2013-04-13 DIAGNOSIS — R0989 Other specified symptoms and signs involving the circulatory and respiratory systems: Secondary | ICD-10-CM

## 2013-04-13 MED ORDER — ALBUTEROL SULFATE HFA 108 (90 BASE) MCG/ACT IN AERS: 2.0000 | INHALATION_SPRAY | RESPIRATORY_TRACT | Status: DC | PRN

## 2013-04-13 MED ORDER — LEVOTHYROXINE SODIUM 50 MCG PO TABS: 50.0000 ug | ORAL_TABLET | Freq: Every day | ORAL | Status: DC

## 2013-04-13 MED ORDER — ATORVASTATIN CALCIUM 10 MG PO TABS: 10.0000 mg | ORAL_TABLET | Freq: Every day | ORAL | Status: DC

## 2013-04-13 MED ORDER — PAROXETINE HCL 20 MG PO TABS: 20.0000 mg | ORAL_TABLET | ORAL | Status: DC

## 2013-04-13 MED ORDER — METOPROLOL SUCCINATE ER 25 MG PO TB24: 25.0000 mg | ORAL_TABLET | Freq: Every day | ORAL | Status: DC

## 2013-04-13 NOTE — Patient Instructions (Addendum)
Bring all of your medicines to your next appointment  Follow-up in 1 month

## 2013-04-14 DIAGNOSIS — R69 Illness, unspecified: Secondary | ICD-10-CM | POA: Insufficient documentation

## 2013-04-14 NOTE — Assessment & Plan Note (Signed)
Will restart paroxetine at 20 mg

## 2013-04-14 NOTE — Assessment & Plan Note (Signed)
Resolved today, following with cardiology for this

## 2013-04-14 NOTE — Assessment & Plan Note (Signed)
Chronic, euvolemic, normal exam.  I suspect component of deconditioning and anxiety playing a role.  Also not taking any thyroid replacement.  Will restart levothyroxine, refill albuterol, encouraged increasing activity slowly

## 2013-04-14 NOTE — Assessment & Plan Note (Signed)
TSH swings from high to low- poor understanding of medications with little continuity of care.  I suspect TSh was suppressed last time due to other providers increasing her dose, when compliance was the issue.  Will restart at 50 mcg, recheck in 6 weeks.

## 2013-04-14 NOTE — Assessment & Plan Note (Signed)
Multiple ER visits, with poor outpatient follow-up with PCP and specialists.  Patient reports finances not an issue.  Suspect poor literacy.  Will refer to social worker to help with establishing Medicaid case manager.

## 2013-04-14 NOTE — Progress Notes (Signed)
  Subjective:    Patient ID: [name], female    DOB: [birth year], 57 y.o.   MRN: [id number]  HPI Patient presents today for refill of medications.  She reports not taking any of her medications for several months.  She is not sure which medications she is on or supposed to be taking except for she requests her paxil.  She reports some dyspnea for which she is using albuterol.  She reports it is at night, sometimes but not usually exertional- chronic  I have reviewed her last few presentations to [facility] providers.  In the past few months, has had ablation for palpitations.  Patient reports no palpitations.   Medication list not accurate- does not reflect what is recorded in last cardiology note- ? Updated in ER?   Review of Systems See HPI    Objective:   Physical Exam GEN: Alert & Oriented, No acute distress, here with a female friend.  Answers questions about symptoms vaguely.  Suspect poor historian due to low health literacy CV:  Regular Rate & Rhythm, no murmur Respiratory:  Normal work of breathing, CTAB Abd:  + BS, soft, no tenderness to palpation Ext: no pre-tibial edema        Assessment & Plan:

## 2013-04-28 ENCOUNTER — Encounter (HOSPITAL_COMMUNITY): Payer: Self-pay | Admitting: *Deleted

## 2013-04-28 ENCOUNTER — Emergency Department (HOSPITAL_COMMUNITY)
Admission: EM | Admit: 2013-04-28 | Discharge: 2013-04-28 | Disposition: A | Discharge status: Home / Self Care | Payer: Medicaid Other | Attending: Emergency Medicine | Admitting: Emergency Medicine

## 2013-04-28 ENCOUNTER — Emergency Department (HOSPITAL_COMMUNITY): Payer: Medicaid Other

## 2013-04-28 DIAGNOSIS — J4489 Other specified chronic obstructive pulmonary disease: Secondary | ICD-10-CM | POA: Insufficient documentation

## 2013-04-28 DIAGNOSIS — Z8739 Personal history of other diseases of the musculoskeletal system and connective tissue: Secondary | ICD-10-CM | POA: Insufficient documentation

## 2013-04-28 DIAGNOSIS — Z8709 Personal history of other diseases of the respiratory system: Secondary | ICD-10-CM | POA: Insufficient documentation

## 2013-04-28 DIAGNOSIS — Z8719 Personal history of other diseases of the digestive system: Secondary | ICD-10-CM | POA: Insufficient documentation

## 2013-04-28 DIAGNOSIS — Z8659 Personal history of other mental and behavioral disorders: Secondary | ICD-10-CM | POA: Insufficient documentation

## 2013-04-28 DIAGNOSIS — M25569 Pain in unspecified knee: Secondary | ICD-10-CM | POA: Insufficient documentation

## 2013-04-28 DIAGNOSIS — I10 Essential (primary) hypertension: Secondary | ICD-10-CM | POA: Insufficient documentation

## 2013-04-28 DIAGNOSIS — M25561 Pain in right knee: Secondary | ICD-10-CM

## 2013-04-28 DIAGNOSIS — J45909 Unspecified asthma, uncomplicated: Secondary | ICD-10-CM | POA: Insufficient documentation

## 2013-04-28 DIAGNOSIS — I252 Old myocardial infarction: Secondary | ICD-10-CM | POA: Insufficient documentation

## 2013-04-28 DIAGNOSIS — E669 Obesity, unspecified: Secondary | ICD-10-CM | POA: Insufficient documentation

## 2013-04-28 DIAGNOSIS — Z8679 Personal history of other diseases of the circulatory system: Secondary | ICD-10-CM | POA: Insufficient documentation

## 2013-04-28 DIAGNOSIS — Z87891 Personal history of nicotine dependence: Secondary | ICD-10-CM | POA: Insufficient documentation

## 2013-04-28 DIAGNOSIS — I5032 Chronic diastolic (congestive) heart failure: Secondary | ICD-10-CM | POA: Insufficient documentation

## 2013-04-28 DIAGNOSIS — Z7982 Long term (current) use of aspirin: Secondary | ICD-10-CM | POA: Insufficient documentation

## 2013-04-28 DIAGNOSIS — F411 Generalized anxiety disorder: Secondary | ICD-10-CM | POA: Insufficient documentation

## 2013-04-28 DIAGNOSIS — J449 Chronic obstructive pulmonary disease, unspecified: Secondary | ICD-10-CM | POA: Insufficient documentation

## 2013-04-28 DIAGNOSIS — Z79899 Other long term (current) drug therapy: Secondary | ICD-10-CM | POA: Insufficient documentation

## 2013-04-28 DIAGNOSIS — E039 Hypothyroidism, unspecified: Secondary | ICD-10-CM | POA: Insufficient documentation

## 2013-04-28 MED ORDER — IBUPROFEN 800 MG PO TABS: 800.0000 mg | ORAL_TABLET | Freq: Three times a day (TID) | ORAL | Status: DC

## 2013-04-28 MED ORDER — HYDROCODONE-ACETAMINOPHEN 5-325 MG PO TABS
2.0000 | ORAL_TABLET | Freq: Once | ORAL | Status: AC
  Administered 2013-04-28: 2 via ORAL
  Filled 2013-04-28: qty 2

## 2013-04-28 NOTE — ED Notes (Signed)
Pt in c/o right knee pain since last night, increased pain with movement or bending, denies injury, no history of problems, denies swelling

## 2013-04-28 NOTE — ED Provider Notes (Signed)
History    This chart was scribed for non-physician practitioner Junius Finner working with Celene Kras, MD by Quintella Reichert, ED Scribe. This patient was seen in room TR10C/TR10C and the patient's care was started at 4:36 PM .   CSN: [id number]  Arrival date & time 04/28/13  1541    Chief Complaint  Patient presents with  . Knee Pain     The history is provided by the patient. No language interpreter was used.    HPI Comments: [name] is a 57 y.o. female who presents to the Emergency Department complaining of sudden-onset, severe right knee pani that began this morning when pt got out of bed to go to the bathroom.  Pt describes pain as achy and sharp, at a severity of 10/10. Describes pain as achy and sharp, rated at 10/10.  She states pain is exacerbated by bearing weight on the knee and by bending it.  Pt attempted to treat pain with Tylenol and aspirin, which failed to relieve symptoms.  She denies recent h/o strenuous activity or injury to the knee.  Pt denies weakness, numbness, or tingling in legs.  She denies abdominal pain, emesis, diarrhea, urinary or bowel symptoms or any other associated symptoms.  She denies prior h/o pain or injuries in the area.  Pt take Paxil, Lipitor and Lasix regularly.  Pt does not have an orthopedist.  Past Medical History  Diagnosis Date  . Chest pain     a. s/p reported MI in 1997;  b. 09/2012 Cath: nl cors, nl LV ([place]);  c. 05/2012 Non-ischemic myoview.  . Hypertension   . Arthritis   . Problem with literacy   . Shortness of breath   . Anxiety   . Bronchitis   . Chronic headaches   . Obesity   . COPD (chronic obstructive pulmonary disease)   . Chronic diastolic CHF (congestive heart failure)     a. 12/2011 Echo: nl ef, Gr 1 DD;  b. 11/2012 Echo: EF 45-50%, no significant valvular abnormalities.  Marland Kitchen GERD (gastroesophageal reflux disease)   . Myocardial infarction 1997    self reported  . Hypothyroidism   . Asthma   .  Chronic chest pain     Past Surgical History  Procedure Laterality Date  . Ectopic pregnancy surgery      right  . Nm myoview ltd  June 2013    no reversible ischemia  . Cardiac catheterization  October 2012    No coronary artery disease  . Cardiac catheterization  2012    normal coronary arteries  . Nm myoview ltd  2012, 2013    normal  . 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 Cancer Mother 6    lung ca  . Diabetes Mother   . Anxiety disorder Sister   . Cancer Brother 60    lung    History  Substance Use Topics  . Smoking status: Former Smoker -- 1.00 packs/day for 3 years    Quit date: 12/03/2010  . Smokeless tobacco: Never Used  . Alcohol Use: No    OB History   Grav Para Term Preterm Abortions TAB SAB Ect Mult Living                  Review of Systems  Gastrointestinal: Negative for vomiting, abdominal pain and diarrhea.  Genitourinary: Negative  for dysuria, urgency and difficulty urinating.  Musculoskeletal: Positive for arthralgias (Right knee).  Neurological: Negative for weakness and numbness.  All other systems reviewed and are negative.    Allergies  Penicillins  Home Medications   Current Outpatient Rx  Name  Route  Sig  Dispense  Refill  . albuterol (PROVENTIL HFA;VENTOLIN HFA) 108 (90 BASE) MCG/ACT inhaler   Inhalation   Inhale 2 puffs into the lungs every 4 (four) hours as needed for wheezing or shortness of breath (cough).   1 Inhaler   1   . aspirin EC 81 MG tablet   Oral   Take 81 mg by mouth daily.         Marland Kitchen atorvastatin (LIPITOR) 10 MG tablet   Oral   Take 1 tablet (10 mg total) by mouth daily.   30 tablet   1   . furosemide (LASIX) 20 MG tablet   Oral   Take 1 tablet (20 mg total) by mouth daily.   30 tablet   6   . levothyroxine (SYNTHROID, LEVOTHROID) 50 MCG tablet   Oral   Take 1 tablet (50 mcg total) by mouth daily.   30  tablet   1   . metoprolol succinate (TOPROL-XL) 25 MG 24 hr tablet   Oral   Take 1 tablet (25 mg total) by mouth daily.   30 tablet   1   . PARoxetine (PAXIL) 20 MG tablet   Oral   Take 1 tablet (20 mg total) by mouth every morning.   30 tablet   1   . ibuprofen (ADVIL,MOTRIN) 800 MG tablet   Oral   Take 1 tablet (800 mg total) by mouth 3 (three) times daily.   21 tablet   0     BP 131/74  Pulse 64  Temp(Src) 98.3 F (36.8 C) (Oral)  Resp 20  SpO2 100%  Physical Exam  Nursing note and vitals reviewed. Constitutional: She is oriented to person, place, and time. She appears well-developed and well-nourished. No distress.  Obese female lying on exam bed. NAD  HENT:  Head: Normocephalic and atraumatic.  Eyes: Conjunctivae and EOM are normal.  Neck: Normal range of motion. Neck supple. No tracheal deviation present.  Cardiovascular: Normal rate, regular rhythm and normal heart sounds.   No murmur heard. Pulmonary/Chest: Effort normal and breath sounds normal. No respiratory distress. She has no wheezes. She has no rales.  Musculoskeletal: She exhibits edema (Right knee, per pt's report) and tenderness (Medial aspect of right knee).  Limited flexion of rt knee due to pain.  Pain with weight bearing.   Normal strength dorsal flexion and plantar flexion. Pedal pulses +2 bilaterally.  Neurological: She is alert and oriented to person, place, and time. Coordination normal.  Normal sensation.  Skin: Skin is warm and dry. No rash noted. No erythema.  Psychiatric: She has a normal mood and affect. Her behavior is normal.    ED Course  Procedures (including critical care time)  DIAGNOSTIC STUDIES: Oxygen Saturation is 100% on room air, normal by my interpretation.    COORDINATION OF CARE: 4:41 PM-Discussed treatment plan which includes pain medication and imaging with pt at bedside and pt agreed to plan.    5:38 PM: Informed pt that x-ray ruled out fracture.  Discussed  discharge plan including pain medication, knee sleeve and f/u with PCP with pt at bedside and pt agreed to plan.   Labs Reviewed - No data to display Dg Knee Complete 4  Views Right  04/28/2013   *RADIOLOGY REPORT*  Clinical Data: Knee pain.  RIGHT KNEE - COMPLETE 4+ VIEW  Comparison: 06/03/2012  Findings: Anterior soft tissue swelling anterior to the proximal tibia and lower knee.  No underlying acute bony abnormality.  No fracture, subluxation or dislocation.  No joint effusion.  Joint spaces are maintained.  IMPRESSION: No acute bony abnormality.  Anterior soft tissue swelling over the proximal calf.   Original Report Authenticated By: Charlett Nose, M.D.     1. Right knee pain       MDM  Pt c/o right knee pain w/o any known trauma.  Limited knee flexion due to pain.  Edema to right knee and pain with weight bearing. No redness or warmth of knee, skin in tact. Plain films: no acute bony abnormality.  Rx: knee sleeve and ibuprofen. Pt has crutches at home.  Provided arthralgia and knee pain info packet.  F/u with Dr. Earnest Bailey, PCP, for continued knee pain.   I personally performed the services described in this documentation, which was scribed in my presence. The recorded information has been reviewed and is accurate.      Junius Finner, PA-C 04/29/13 1246

## 2013-04-28 NOTE — ED Notes (Signed)
Discharge instructions reviewed with pt. Pt verbalized understanding.   

## 2013-04-28 NOTE — Progress Notes (Signed)
Orthopedic Tech Progress Note Patient Details:  [name] Mar 03, 1956 [id number]  Ortho Devices Type of Ortho Device: Crutches;Knee Sleeve Ortho Device/Splint Location: right leg Ortho Device/Splint Interventions: Application   Nikki Dom 04/28/2013, 5:58 PM

## 2013-04-28 NOTE — ED Notes (Signed)
Pt c/o left knee pain that began yesterday around 5pm. Pt states she took tylenol pm but is not having any relief. Pt states pain onset was when she got up to go to restroom. Pt has had issues with the knee before, she states she has had issues with that same knee and was told she had fluid on her knee, she was given fluid pills but she ran out. Pt rates pain 10/10.

## 2013-04-29 NOTE — ED Provider Notes (Signed)
Medical screening examination/treatment/procedure(s) were performed by non-physician practitioner and as supervising physician I was immediately available for consultation/collaboration.     Jamesen Stahnke R Wonda Goodgame, MD 04/29/13 1541 

## 2013-04-30 ENCOUNTER — Ambulatory Visit (INDEPENDENT_AMBULATORY_CARE_PROVIDER_SITE_OTHER): Payer: Medicaid Other | Admitting: Family Medicine

## 2013-04-30 ENCOUNTER — Encounter: Payer: Self-pay | Admitting: Family Medicine

## 2013-04-30 VITALS — BP 144/80 | HR 62 | Temp 97.9°F | Ht 68.0 in | Wt 229.1 lb

## 2013-04-30 DIAGNOSIS — M259 Joint disorder, unspecified: Secondary | ICD-10-CM

## 2013-04-30 DIAGNOSIS — M222X1 Patellofemoral disorders, right knee: Secondary | ICD-10-CM

## 2013-04-30 DIAGNOSIS — M25561 Pain in right knee: Secondary | ICD-10-CM

## 2013-04-30 DIAGNOSIS — M25569 Pain in unspecified knee: Secondary | ICD-10-CM

## 2013-04-30 MED ORDER — NAPROXEN 250 MG PO TABS: 250.0000 mg | ORAL_TABLET | Freq: Two times a day (BID) | ORAL | Status: DC

## 2013-04-30 MED ORDER — TRAMADOL HCL 50 MG PO TABS: 50.0000 mg | ORAL_TABLET | Freq: Three times a day (TID) | ORAL | Status: DC | PRN

## 2013-04-30 NOTE — Patient Instructions (Addendum)
It was great to see you today.  For your knee pain,  - I am unsure what is causing it. It has components of patellofemoral syndrome, but also of gout or arthritis.  - We are getting a uric acid level today, and I am prescribing you tramadol and naproxen for pain. Take naproxen first (with food) and tramadol if necessary. Do not take ibuprofen if taking naproxen. - Continue trying to lose weight. - I want you to see Sports Medicine to re-evaluate your pain.  - You can use a cane in the opposite side of the leg that hurts.  We are checking some labs today, and I will call you if they are abnormal. If you do not hear from me with a call or letter in 2 weeks, please call us as I may have been unable to reach you.  As you leave, make an appointment to follow up in 2 weeks.  - Bring all medications in a bag to your visits. - Bring a log of anything that may cause worsening pain.   Take care and seek immediate care sooner if you develop concerns.

## 2013-04-30 NOTE — Progress Notes (Signed)
Patient ID: ELIZABETHANNE LUSHER, female   DOB: 1956-03-14, 57 y.o.   MRN: [id number]  Subjective:   CC: right knee pain  HPI: MALYA CIRILLO is a 57 y.o. female with h/o patellofemoral syndrome and obesity here for right knee pain.  Pt describes that pain started suddenly 4 nights ago when she got up to go to bathroom at medial side of patella, with no trauma or twisting of knee. She thinks it feels like patellofemoral pain has felt in the past but worse with pain at 10/10, sharp, intermittent, making it difficult to walk. Medial knee is swollen and pops when she bends it/goes down stairs. She tried tylenol PM which did not help, nor did ibuprofen. Worse with walking and going down stairs. Has not changed since Monday. Reports walking daily and states she does patellofemoral exercises shown to her in the past by Dr. Jennette Kettle but she is unable to describe them to me. She does not use brace provided in recent ED visit for knee pain or heating pad that was suggested. She denies fevers/chills, worse SOB than normal, recent falls, radiation, erythema, stiffness, or other joint abnormalities. At recent ED visit, knee x-ray showed no acute findings.  Review of Systems - Per HPI; all other systems reviewed and negative.  Past Medical History  Diagnosis Date  . Chest pain     a. s/p reported MI in 1997;  b. 09/2012 Cath: nl cors, nl LV ([place]);  c. 05/2012 Non-ischemic myoview.  . Hypertension   . Arthritis   . Problem with literacy   . Shortness of breath   . Anxiety   . Bronchitis   . Chronic headaches   . Obesity   . COPD (chronic obstructive pulmonary disease)   . Chronic diastolic CHF (congestive heart failure)     a. 12/2011 Echo: nl ef, Gr 1 DD;  b. 11/2012 Echo: EF 45-50%, no significant valvular abnormalities.  Marland Kitchen GERD (gastroesophageal reflux disease)   . Myocardial infarction 1997    self reported  . Hypothyroidism   . Asthma   . Chronic chest pain    No new medications. ED visit 2  days ago for knee pain with knee xray negative for acute changes. [id number]     Objective:  Physical Exam BP 144/80  Pulse 62  Temp(Src) 97.9 F (36.6 C) (Oral)  Ht 5\' 8"  (1.727 m)  Wt 229 lb 1.6 oz (103.919 kg)  BMI 34.84 kg/m2 GEN: NAD, pleasant, seated on exam table, obese EXTR/MSK: Legs symmetric and obese, making visualization of landmarks difficult. Unable to elicit patellar reflex ?due to habitus. No erythema, visible swelling or effusion. No noted deformity. Mild-moderate tenderness medial right knee. Unable to visualize if q angle present, though with width of hips and position of knees expect that it may be. Full ROM and 5/5 strength knee flexion and extension, full hip ROM, antalgic gait favoring right knee, difficulty weight-bearing right leg.      Assessment:     AYSA LARIVEE is a 57 y.o. female with h/o patellofemoral syndrome and obesity here for right knee pain.    Plan:     # See problem list for problem-specific plans.

## 2013-05-01 DIAGNOSIS — M1711 Unilateral primary osteoarthritis, right knee: Secondary | ICD-10-CM | POA: Insufficient documentation

## 2013-05-01 NOTE — Assessment & Plan Note (Addendum)
Unclear picture. Current knee pain has some characteristics of patellofemoral syndrome (habitus, h/o the same, obesity), but also of gout (sudden onset, though not a red joint) and of osteoarthritis (medial pain/tenderness, popping sensation suggesting meniscal involvement).  - Uric acid - Suggested using cane, holding with left hand - Pain control with naproxen (do not take ibuprofen if taking this) and tramadol temporarily; watch for signs of serotonin syndrome though low-moderate dose tramadol and moderate dose paxil - Continue weight loss efforts. Lost 5 lbs since end of April. - Follow-up with Sports Medicine, as pt has seen them before for patellofemoral syndrome - F/u 2 weeks with PCP, bring log of any pain triggers

## 2013-05-07 ENCOUNTER — Ambulatory Visit: Payer: Self-pay | Admitting: Family Medicine

## 2013-05-11 ENCOUNTER — Ambulatory Visit: Payer: Self-pay | Admitting: Family Medicine

## 2013-05-11 ENCOUNTER — Ambulatory Visit (INDEPENDENT_AMBULATORY_CARE_PROVIDER_SITE_OTHER): Payer: Medicaid Other | Admitting: Family Medicine

## 2013-05-11 ENCOUNTER — Encounter: Payer: Self-pay | Admitting: Family Medicine

## 2013-05-11 VITALS — BP 132/67 | HR 67 | Ht 68.0 in | Wt 235.0 lb

## 2013-05-11 DIAGNOSIS — M25569 Pain in unspecified knee: Secondary | ICD-10-CM

## 2013-05-11 DIAGNOSIS — M25561 Pain in right knee: Secondary | ICD-10-CM

## 2013-05-11 MED ORDER — TRAMADOL HCL 50 MG PO TABS: 50.0000 mg | ORAL_TABLET | Freq: Three times a day (TID) | ORAL | Status: DC | PRN

## 2013-05-11 MED ORDER — NAPROXEN 250 MG PO TABS: 250.0000 mg | ORAL_TABLET | Freq: Two times a day (BID) | ORAL | Status: DC

## 2013-05-11 NOTE — Patient Instructions (Addendum)
It was great to see you again!  For your knee pain,  - Continue trying to lose weight. One goal you came up with was cutting back on sweets from every other day to 2x per week. Journal this and bring it to the next visit. - Continue walking and try to increase if possible. - I am refilling your tramadol and naproxen.  - We are getting an MRI of the right knee today. - Follow up with sports medicine as scheduled June 6. - If you develop any concerning symptoms like feelings of agitation, tremor, flushing, rigid muscles, high blood pressure, vision changes, temperature changes, or other concerning signs, please let us know immediately and stop taking tramadol.  As you leave, make an appointment to follow up with your primary doctor for routine health maintenance visit when convenient for you. - Bring all medications in a bag to your visits.   Take care and seek immediate care sooner if you develop any concerns.

## 2013-05-11 NOTE — Assessment & Plan Note (Addendum)
Knee pain on exam today appears more consistent with medial meniscus degeneration/degenerative disc disease with positive McMurray's sign and medial joint line tenderness. Review of ED x-ray which is probably not weight-bearing x-ray reveals possible thickening of medial side of tibia, possible prequel to bone spur formation. - Patient continues to be very frustrated with pain and would consider surgery if further imaging was WNL. - Right knee MRI. If positive, surgery referral. If negative, will attempt joint aspiration to further evaluate for gout with uric acid level borderline high at 7. - With previous very short-term relief (2d) with joint injection, pt prefers not to do this again at this time. - Continue weight loss attempt. Gained 5 lbs since last visit. Pt goal = cut back on sweets from q other day to 2x/week. Bring log of sweets to f/u. Continue walking and try to increase from 2x/week. - Refilled tramadol and naproxen. Discussed with patient holding off on refilling percocet and flexeril. - Use cane. - Follow up with sports medicine as scheduled June 6. - Return precautions per AVS for worsened joint symptoms and serotonin syndrome

## 2013-05-11 NOTE — Progress Notes (Signed)
Patient ID: [name], female   DOB: 1956-08-22, 57 y.o.   MRN: [id number] Subjective:    CC: Follow up right knee pain  HPI: [name] is a 57 y.o. female with h/o obesity and patellofemoral syndrome here for follow-up of right knee pain.  Patient describes knee pain as unchanged since last visit, with minimal help from occasional cane use, no improvement, pain worse when laying down but relatively constant. She denies redness, fevers/chills, falls, effusion, though she feels the knee still seems somewhat swollen. Uric acid measured at last visit was borderline high at 7. Pain is over right knee cap and some medially. Takes tramadol 50mg  daily, naproxen 250mg  daily with meal, percocet 5-325mg  once daily, and flexeril once weekly. THese help greatly with pain, reducing it from 9/10 to 0/10. Has appt 6/6 with sports medicine.  Requests refills on all medications. On closer inspection, pt realizes chronic disease medications have 1 month refill on them.  Review of Systems - Per HPI; all other systems reviewed and negative.  Past Medical History  Diagnosis Date  . Chest pain     a. s/p reported MI in 1997;  b. 09/2012 Cath: nl cors, nl LV ([place]);  c. 05/2012 Non-ischemic myoview.  . Hypertension   . Arthritis   . Problem with literacy   . Shortness of breath   . Anxiety   . Bronchitis   . Chronic headaches   . Obesity   . COPD (chronic obstructive pulmonary disease)   . Chronic diastolic CHF (congestive heart failure)     a. 12/2011 Echo: nl ef, Gr 1 DD;  b. 11/2012 Echo: EF 45-50%, no significant valvular abnormalities.  Marland Kitchen GERD (gastroesophageal reflux disease)   . Myocardial infarction 1997    self reported  . Hypothyroidism   . Asthma   . Chronic chest pain    WU:[id number] twice weekly; eats lots of sweets especially chocolate every other day     Objective:  Physical Exam BP 132/67  Pulse 67  Ht 5\' 8"  (1.727 m)  Wt 235 lb (106.595 kg)  BMI 35.74 kg/m2 GEN:  NAD MSK: Gait: antalgic; Able to weight bear on right leg; no increased erythema or swelling right leg or knee; Mild medial jiont tenderness; 5/5 knee flexion and extension, full hip and knee ROM with right hip ROM causing medial knee pain. 2+ bilateral patellar and achilles tendon reflexes. Mild patellar tenderenss medially.    Assessment:     [name] is a 57 y.o. female with h/o obesity and patellofemoral syndrome here for follow-up of right knee pain.    Plan:     # Health maintenance:  - F/u with primary doctor for routine health maintenance including consider colonoscopy. - Bring medications in bag to visits. - Has 1 month of refills on albuterol, paroxetine, atorvastatin, and synthroid; encouraged follow-up prior to further refills.  # See problem list for problem-specific plans.

## 2013-05-12 ENCOUNTER — Encounter (HOSPITAL_COMMUNITY): Payer: Self-pay

## 2013-05-12 ENCOUNTER — Emergency Department (HOSPITAL_COMMUNITY): Payer: Medicaid Other

## 2013-05-12 ENCOUNTER — Emergency Department (HOSPITAL_COMMUNITY)
Admission: EM | Admit: 2013-05-12 | Discharge: 2013-05-12 | Disposition: A | Discharge status: Home / Self Care | Payer: Medicaid Other | Attending: Emergency Medicine | Admitting: Emergency Medicine

## 2013-05-12 ENCOUNTER — Other Ambulatory Visit: Payer: Self-pay

## 2013-05-12 DIAGNOSIS — Z87891 Personal history of nicotine dependence: Secondary | ICD-10-CM | POA: Insufficient documentation

## 2013-05-12 DIAGNOSIS — I252 Old myocardial infarction: Secondary | ICD-10-CM | POA: Insufficient documentation

## 2013-05-12 DIAGNOSIS — Z8719 Personal history of other diseases of the digestive system: Secondary | ICD-10-CM | POA: Insufficient documentation

## 2013-05-12 DIAGNOSIS — Z8739 Personal history of other diseases of the musculoskeletal system and connective tissue: Secondary | ICD-10-CM | POA: Insufficient documentation

## 2013-05-12 DIAGNOSIS — R071 Chest pain on breathing: Secondary | ICD-10-CM | POA: Insufficient documentation

## 2013-05-12 DIAGNOSIS — Z7982 Long term (current) use of aspirin: Secondary | ICD-10-CM | POA: Insufficient documentation

## 2013-05-12 DIAGNOSIS — Z9889 Other specified postprocedural states: Secondary | ICD-10-CM | POA: Insufficient documentation

## 2013-05-12 DIAGNOSIS — Z8709 Personal history of other diseases of the respiratory system: Secondary | ICD-10-CM | POA: Insufficient documentation

## 2013-05-12 DIAGNOSIS — F411 Generalized anxiety disorder: Secondary | ICD-10-CM | POA: Insufficient documentation

## 2013-05-12 DIAGNOSIS — E039 Hypothyroidism, unspecified: Secondary | ICD-10-CM | POA: Insufficient documentation

## 2013-05-12 DIAGNOSIS — Z79899 Other long term (current) drug therapy: Secondary | ICD-10-CM | POA: Insufficient documentation

## 2013-05-12 DIAGNOSIS — I5032 Chronic diastolic (congestive) heart failure: Secondary | ICD-10-CM | POA: Insufficient documentation

## 2013-05-12 DIAGNOSIS — J449 Chronic obstructive pulmonary disease, unspecified: Secondary | ICD-10-CM | POA: Insufficient documentation

## 2013-05-12 DIAGNOSIS — E669 Obesity, unspecified: Secondary | ICD-10-CM | POA: Insufficient documentation

## 2013-05-12 DIAGNOSIS — R0789 Other chest pain: Secondary | ICD-10-CM

## 2013-05-12 DIAGNOSIS — Z880 Allergy status to penicillin: Secondary | ICD-10-CM | POA: Insufficient documentation

## 2013-05-12 DIAGNOSIS — J4489 Other specified chronic obstructive pulmonary disease: Secondary | ICD-10-CM | POA: Insufficient documentation

## 2013-05-12 DIAGNOSIS — G8929 Other chronic pain: Secondary | ICD-10-CM | POA: Insufficient documentation

## 2013-05-12 DIAGNOSIS — I10 Essential (primary) hypertension: Secondary | ICD-10-CM | POA: Insufficient documentation

## 2013-05-12 LAB — BASIC METABOLIC PANEL
BUN: 20 mg/dL (ref 6–23)
CO2: 25 mEq/L (ref 19–32)
Calcium: 9.4 mg/dL (ref 8.4–10.5)
Creatinine, Ser: 0.91 mg/dL (ref 0.50–1.10)
GFR calc non Af Amer: 69 mL/min — ABNORMAL LOW (ref >90)
Glucose, Bld: 93 mg/dL (ref 70–99)

## 2013-05-12 LAB — CBC WITH DIFFERENTIAL/PLATELET
Eosinophils Absolute: 0.3 10*3/uL (ref 0.0–0.7)
Eosinophils Relative: 5 % (ref 0–5)
HCT: 35.6 % — ABNORMAL LOW (ref 36.0–46.0)
Lymphocytes Relative: 30 % (ref 12–46)
Lymphs Abs: 1.9 10*3/uL (ref 0.7–4.0)
MCH: 27.7 pg (ref 26.0–34.0)
MCV: 85 fL (ref 78.0–100.0)
Monocytes Absolute: 0.6 10*3/uL (ref 0.1–1.0)
Monocytes Relative: 9 % (ref 3–12)
RBC: 4.19 MIL/uL (ref 3.87–5.11)
WBC: 6.4 10*3/uL (ref 4.0–10.5)

## 2013-05-12 LAB — POCT I-STAT TROPONIN I

## 2013-05-12 MED ORDER — MORPHINE SULFATE 4 MG/ML IJ SOLN
4.0000 mg | Freq: Once | INTRAMUSCULAR | Status: AC
  Administered 2013-05-12: 4 mg via INTRAVENOUS
  Filled 2013-05-12: qty 1

## 2013-05-12 MED ORDER — LORAZEPAM 2 MG/ML IJ SOLN
1.0000 mg | Freq: Once | INTRAMUSCULAR | Status: AC
  Administered 2013-05-12: 1 mg via INTRAVENOUS
  Filled 2013-05-12: qty 1

## 2013-05-12 MED ORDER — SODIUM CHLORIDE 0.9 % IV SOLN
INTRAVENOUS | Status: DC
  Administered 2013-05-12: 21:00:00 via INTRAVENOUS

## 2013-05-12 MED ORDER — METHOCARBAMOL 750 MG PO TABS: 750.0000 mg | ORAL_TABLET | Freq: Four times a day (QID) | ORAL | Status: DC

## 2013-05-12 MED ORDER — OXYCODONE-ACETAMINOPHEN 5-325 MG PO TABS: 2.0000 | ORAL_TABLET | ORAL | Status: DC | PRN

## 2013-05-12 MED ORDER — IBUPROFEN 800 MG PO TABS: 800.0000 mg | ORAL_TABLET | Freq: Three times a day (TID) | ORAL | Status: DC

## 2013-05-12 NOTE — ED Notes (Signed)
Pt. C/o right intermittent chest pain x3 days but is worse tonight. States pain lasts about 5 minutes, with SOB and diaphoresis. Hx of MI. Pt. Reports dry cough and runny nose. Denies fevers. Also c/o lower back pain, which is chronic.

## 2013-05-12 NOTE — ED Provider Notes (Signed)
History     CSN: [id number]  Arrival date & time 05/12/13  2100   First MD Initiated Contact with Patient 05/12/13 2108      Chief Complaint  Patient presents with  . Chest Pain    (Consider location/radiation/quality/duration/timing/severity/associated sxs/prior treatment) Patient is a 57 y.o. female presenting with chest pain. The history is provided by the patient.  Chest Pain Pain location:  R chest Pain quality: sharp and stabbing   Pain radiates to:  Does not radiate Pain radiates to the back: no   Pain severity:  Mild Onset quality:  Sudden Timing:  Intermittent Progression:  Worsening Chronicity:  New Context: movement and raising an arm   Context: no drug use and no stress   Relieved by:  Aspirin Associated symptoms: no cough, no diaphoresis, no dizziness, no fever, no nausea, no shortness of breath and not vomiting   pt with neg heart cath 2012--pain x 1 week, non exertional  Past Medical History  Diagnosis Date  . Chest pain     a. s/p reported MI in 1997;  b. 09/2012 Cath: nl cors, nl LV ([place]);  c. 05/2012 Non-ischemic myoview.  . Hypertension   . Arthritis   . Problem with literacy   . Shortness of breath   . Anxiety   . Bronchitis   . Chronic headaches   . Obesity   . COPD (chronic obstructive pulmonary disease)   . Chronic diastolic CHF (congestive heart failure)     a. 12/2011 Echo: nl ef, Gr 1 DD;  b. 11/2012 Echo: EF 45-50%, no significant valvular abnormalities.  Marland Kitchen GERD (gastroesophageal reflux disease)   . Myocardial infarction 1997    self reported  . Hypothyroidism   . Asthma   . Chronic chest pain     Past Surgical History  Procedure Laterality Date  . Ectopic pregnancy surgery      right  . Nm myoview ltd  June 2013    no reversible ischemia  . Cardiac catheterization  October 2012    No coronary artery disease  . Cardiac catheterization  2012    normal coronary arteries  . Nm myoview ltd  2012, 2013    normal  . 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 Cancer Mother 61    lung ca  . Diabetes Mother   . Anxiety disorder Sister   . Cancer Brother 60    lung    History  Substance Use Topics  . Smoking status: Former Smoker -- 1.00 packs/day for 3 years    Quit date: 12/03/2010  . Smokeless tobacco: Never Used  . Alcohol Use: No    OB History   Grav Para Term Preterm Abortions TAB SAB Ect Mult Living                  Review of Systems  Constitutional: Negative for fever and diaphoresis.  Respiratory: Negative for cough and shortness of breath.   Cardiovascular: Positive for chest pain.  Gastrointestinal: Negative for nausea and vomiting.  Neurological: Negative for dizziness.  All other systems reviewed and are negative.    Allergies  Penicillins  Home Medications   Current Outpatient Rx  Name  Route  Sig  Dispense  Refill  . albuterol (PROVENTIL HFA;VENTOLIN HFA) 108 (90 BASE) MCG/ACT inhaler   Inhalation   Inhale 2 puffs into  the lungs every 4 (four) hours as needed for wheezing or shortness of breath (cough).   1 Inhaler   1   . aspirin EC 81 MG tablet   Oral   Take 81 mg by mouth daily.         Marland Kitchen atorvastatin (LIPITOR) 10 MG tablet   Oral   Take 1 tablet (10 mg total) by mouth daily.   30 tablet   1   . furosemide (LASIX) 20 MG tablet   Oral   Take 1 tablet (20 mg total) by mouth daily.   30 tablet   6   . levothyroxine (SYNTHROID, LEVOTHROID) 50 MCG tablet   Oral   Take 1 tablet (50 mcg total) by mouth daily.   30 tablet   1   . metoprolol succinate (TOPROL-XL) 25 MG 24 hr tablet   Oral   Take 1 tablet (25 mg total) by mouth daily.   30 tablet   1   . naproxen (NAPROSYN) 250 MG tablet   Oral   Take 1 tablet (250 mg total) by mouth 2 (two) times daily with a meal.   30 tablet   0   . PARoxetine (PAXIL) 20 MG tablet   Oral   Take 1 tablet (20 mg total) by mouth every  morning.   30 tablet   1   . traMADol (ULTRAM) 50 MG tablet   Oral   Take 1 tablet (50 mg total) by mouth every 8 (eight) hours as needed for pain.   30 tablet   0     SpO2 98%  Physical Exam  Nursing note and vitals reviewed. Constitutional: She is oriented to person, place, and time. She appears well-developed and well-nourished.  Non-toxic appearance. No distress.  HENT:  Head: Normocephalic and atraumatic.  Eyes: Conjunctivae, EOM and lids are normal. Pupils are equal, round, and reactive to light.  Neck: Normal range of motion. Neck supple. No tracheal deviation present. No mass present.  Cardiovascular: Normal rate, regular rhythm and normal heart sounds.  Exam reveals no gallop.   No murmur heard. Pulmonary/Chest: Effort normal and breath sounds normal. No stridor. No respiratory distress. She has no decreased breath sounds. She has no wheezes. She has no rhonchi. She has no rales.    Abdominal: Soft. Normal appearance and bowel sounds are normal. She exhibits no distension. There is no tenderness. There is no rebound and no CVA tenderness.  Musculoskeletal: Normal range of motion. She exhibits no edema and no tenderness.  Neurological: She is alert and oriented to person, place, and time. She has normal strength. No cranial nerve deficit or sensory deficit. GCS eye subscore is 4. GCS verbal subscore is 5. GCS motor subscore is 6.  Skin: Skin is warm and dry. No abrasion and no rash noted.  Psychiatric: She has a normal mood and affect. Her speech is normal and behavior is normal.    ED Course  Procedures (including critical care time)  Labs Reviewed  D-DIMER, QUANTITATIVE  CBC WITH DIFFERENTIAL  BASIC METABOLIC PANEL   No results found.   No diagnosis found.    MDM     Rate: 56   Rhythm: normal sinus rhythm  QRS Axis: normal  Intervals: normal  ST/T Wave abnormalities: normal  Conduction Disutrbances:none  Narrative Interpretation:   Old EKG Reviewed:  none available  11:10 PM Patient given medications for her chest wall pain and feels better. D-dimer negative. Recent heart catheterization also negative. Suspect that  she has muscle strain and she is stable for discharge.        Toy Baker, MD 05/12/13 [id number]

## 2013-05-12 NOTE — Progress Notes (Signed)
Yes, Gayla Medicus, [facility] RN, comes to clinic every Thursday.  I will go ahead and send this referral into [facility].

## 2013-05-12 NOTE — ED Notes (Signed)
Per EMS, pt c/o intermittent sharp chest pain that appears to be associated with deep inspiration. Took 324 ASA, currently pain free. Hx of MI. Pt. Also c/o right lower quadrant pain and lower back pain.

## 2013-05-14 ENCOUNTER — Ambulatory Visit (HOSPITAL_COMMUNITY)
Admission: RE | Admit: 2013-05-14 | Discharge: 2013-05-14 | Disposition: A | Discharge status: Home / Self Care | Payer: Medicaid Other | Source: Ambulatory Visit | Attending: Family Medicine | Admitting: Family Medicine

## 2013-05-14 DIAGNOSIS — M25469 Effusion, unspecified knee: Secondary | ICD-10-CM | POA: Insufficient documentation

## 2013-05-14 DIAGNOSIS — M7120 Synovial cyst of popliteal space [Baker], unspecified knee: Secondary | ICD-10-CM | POA: Insufficient documentation

## 2013-05-14 DIAGNOSIS — M25561 Pain in right knee: Secondary | ICD-10-CM

## 2013-05-17 ENCOUNTER — Telehealth: Payer: Self-pay | Admitting: Family Medicine

## 2013-05-17 ENCOUNTER — Encounter: Payer: Self-pay | Admitting: Family Medicine

## 2013-05-17 NOTE — Telephone Encounter (Signed)
Patient is calling to get the results of the testing that was done on her knee.

## 2013-05-17 NOTE — Telephone Encounter (Signed)
Called and let patient know of following MRI right knee results:   IMPRESSION:  1. Moderate knee effusion with mild synovitis. Small Baker's cyst. 2. Tri-compartmental chondral thinning with chondral irregularity in the patellofemoral joint and medial compartment. Degenerative focus of subcortical edema along the posterior patellar ridge. 3. Focally accentuated inferior surface can cavity, mid body of medial meniscus, probably due to meniscal contouring to the adjacent articular cartilage, less likely an inferior surface tear.   Informed pt there was likely no meniscal tear to treat surgically and that she would benefit most from her Sports Medicine appointment, where they could use ultrasound-guidance to aspirate fluid from the knee for diagnosis. Patient is obese and locating anatomic markers on knee is difficult; however, could attempt this in clinic if she is unable to make another Sports Medicine appt. Pt states she missed her initial SM appt and will call to try to make another.  Simone Curia 05/17/2013 5:28 PM

## 2013-05-21 ENCOUNTER — Other Ambulatory Visit: Payer: Self-pay | Admitting: Family Medicine

## 2013-05-21 ENCOUNTER — Emergency Department (HOSPITAL_COMMUNITY)
Admission: EM | Admit: 2013-05-21 | Discharge: 2013-05-21 | Disposition: A | Discharge status: Home / Self Care | Payer: Medicaid Other | Attending: Emergency Medicine | Admitting: Emergency Medicine

## 2013-05-21 ENCOUNTER — Emergency Department (HOSPITAL_COMMUNITY): Payer: Medicaid Other

## 2013-05-21 ENCOUNTER — Encounter (HOSPITAL_COMMUNITY): Payer: Self-pay | Admitting: Emergency Medicine

## 2013-05-21 ENCOUNTER — Ambulatory Visit (INDEPENDENT_AMBULATORY_CARE_PROVIDER_SITE_OTHER): Payer: Medicaid Other | Admitting: Family Medicine

## 2013-05-21 VITALS — BP 143/84 | Ht 68.0 in | Wt 220.0 lb

## 2013-05-21 DIAGNOSIS — M222X1 Patellofemoral disorders, right knee: Secondary | ICD-10-CM

## 2013-05-21 DIAGNOSIS — I252 Old myocardial infarction: Secondary | ICD-10-CM | POA: Insufficient documentation

## 2013-05-21 DIAGNOSIS — Z79899 Other long term (current) drug therapy: Secondary | ICD-10-CM | POA: Insufficient documentation

## 2013-05-21 DIAGNOSIS — R42 Dizziness and giddiness: Secondary | ICD-10-CM | POA: Insufficient documentation

## 2013-05-21 DIAGNOSIS — I10 Essential (primary) hypertension: Secondary | ICD-10-CM | POA: Insufficient documentation

## 2013-05-21 DIAGNOSIS — Z87891 Personal history of nicotine dependence: Secondary | ICD-10-CM | POA: Insufficient documentation

## 2013-05-21 DIAGNOSIS — M259 Joint disorder, unspecified: Secondary | ICD-10-CM

## 2013-05-21 DIAGNOSIS — I5032 Chronic diastolic (congestive) heart failure: Secondary | ICD-10-CM | POA: Insufficient documentation

## 2013-05-21 DIAGNOSIS — Z8659 Personal history of other mental and behavioral disorders: Secondary | ICD-10-CM | POA: Insufficient documentation

## 2013-05-21 DIAGNOSIS — Z880 Allergy status to penicillin: Secondary | ICD-10-CM | POA: Insufficient documentation

## 2013-05-21 DIAGNOSIS — R1013 Epigastric pain: Secondary | ICD-10-CM | POA: Insufficient documentation

## 2013-05-21 DIAGNOSIS — E669 Obesity, unspecified: Secondary | ICD-10-CM

## 2013-05-21 DIAGNOSIS — T394X5A Adverse effect of antirheumatics, not elsewhere classified, initial encounter: Secondary | ICD-10-CM | POA: Insufficient documentation

## 2013-05-21 DIAGNOSIS — F411 Generalized anxiety disorder: Secondary | ICD-10-CM | POA: Insufficient documentation

## 2013-05-21 DIAGNOSIS — Z9861 Coronary angioplasty status: Secondary | ICD-10-CM | POA: Insufficient documentation

## 2013-05-21 DIAGNOSIS — Z7982 Long term (current) use of aspirin: Secondary | ICD-10-CM | POA: Insufficient documentation

## 2013-05-21 DIAGNOSIS — M25561 Pain in right knee: Secondary | ICD-10-CM

## 2013-05-21 DIAGNOSIS — J449 Chronic obstructive pulmonary disease, unspecified: Secondary | ICD-10-CM | POA: Insufficient documentation

## 2013-05-21 DIAGNOSIS — M129 Arthropathy, unspecified: Secondary | ICD-10-CM | POA: Insufficient documentation

## 2013-05-21 DIAGNOSIS — E039 Hypothyroidism, unspecified: Secondary | ICD-10-CM | POA: Insufficient documentation

## 2013-05-21 DIAGNOSIS — M25569 Pain in unspecified knee: Secondary | ICD-10-CM

## 2013-05-21 DIAGNOSIS — K219 Gastro-esophageal reflux disease without esophagitis: Secondary | ICD-10-CM | POA: Insufficient documentation

## 2013-05-21 DIAGNOSIS — Z8679 Personal history of other diseases of the circulatory system: Secondary | ICD-10-CM | POA: Insufficient documentation

## 2013-05-21 DIAGNOSIS — J4489 Other specified chronic obstructive pulmonary disease: Secondary | ICD-10-CM | POA: Insufficient documentation

## 2013-05-21 DIAGNOSIS — K2960 Other gastritis without bleeding: Secondary | ICD-10-CM | POA: Insufficient documentation

## 2013-05-21 LAB — COMPREHENSIVE METABOLIC PANEL
ALT: 12 U/L (ref 0–35)
AST: 19 U/L (ref 0–37)
Alkaline Phosphatase: 87 U/L (ref 39–117)
CO2: 22 mEq/L (ref 19–32)
Chloride: 105 mEq/L (ref 96–112)
GFR calc Af Amer: >90 mL/min (ref >90)
GFR calc non Af Amer: >90 mL/min (ref >90)
Glucose, Bld: 88 mg/dL (ref 70–99)
Potassium: 3.8 mEq/L (ref 3.5–5.1)
Sodium: 138 mEq/L (ref 135–145)

## 2013-05-21 LAB — TROPONIN I: Troponin I: <0.3 ng/mL (ref <0.30)

## 2013-05-21 LAB — CBC
HCT: 35.2 % — ABNORMAL LOW (ref 36.0–46.0)
Hemoglobin: 11.7 g/dL — ABNORMAL LOW (ref 12.0–15.0)
MCH: 28.3 pg (ref 26.0–34.0)
MCHC: 33.2 g/dL (ref 30.0–36.0)
MCV: 85.2 fL (ref 78.0–100.0)

## 2013-05-21 LAB — URINALYSIS, ROUTINE W REFLEX MICROSCOPIC
Bilirubin Urine: NEGATIVE
Glucose, UA: NEGATIVE mg/dL
Hgb urine dipstick: NEGATIVE
Protein, ur: NEGATIVE mg/dL
Urobilinogen, UA: 0.2 mg/dL (ref 0.0–1.0)

## 2013-05-21 LAB — URINE MICROSCOPIC-ADD ON

## 2013-05-21 MED ORDER — HYDROCODONE-ACETAMINOPHEN 5-325 MG PO TABS: 1.0000 | ORAL_TABLET | ORAL | Status: DC | PRN

## 2013-05-21 MED ORDER — MORPHINE SULFATE 4 MG/ML IJ SOLN
4.0000 mg | Freq: Once | INTRAMUSCULAR | Status: AC
  Administered 2013-05-21: 4 mg via INTRAVENOUS
  Filled 2013-05-21: qty 1

## 2013-05-21 MED ORDER — ONDANSETRON HCL 4 MG/2ML IJ SOLN
4.0000 mg | Freq: Once | INTRAMUSCULAR | Status: AC
  Administered 2013-05-21: 4 mg via INTRAVENOUS
  Filled 2013-05-21: qty 2

## 2013-05-21 MED ORDER — SODIUM CHLORIDE 0.9 % IV BOLUS (SEPSIS)
1000.0000 mL | Freq: Once | INTRAVENOUS | Status: AC
  Administered 2013-05-21: 1000 mL via INTRAVENOUS

## 2013-05-21 MED ORDER — LANSOPRAZOLE 30 MG PO CPDR: 30.0000 mg | DELAYED_RELEASE_CAPSULE | Freq: Every day | ORAL | Status: DC

## 2013-05-21 MED ORDER — PANTOPRAZOLE SODIUM 40 MG IV SOLR
40.0000 mg | Freq: Once | INTRAVENOUS | Status: AC
  Administered 2013-05-21: 40 mg via INTRAVENOUS
  Filled 2013-05-21: qty 40

## 2013-05-21 MED ORDER — GI COCKTAIL ~~LOC~~
30.0000 mL | Freq: Once | ORAL | Status: AC
  Administered 2013-05-21: 30 mL via ORAL
  Filled 2013-05-21: qty 30

## 2013-05-21 MED ORDER — ONDANSETRON 4 MG PO TBDP: ORAL_TABLET | ORAL | Status: DC

## 2013-05-21 NOTE — Progress Notes (Signed)
[name] I am sending this to you as her referral evidently must be made by her PCP which is [facility]. ???I dunno! I saw her at [facility] this AM and that is what [facility] told me---I want to send her to [facility] for knee pain---cartilage thinning---eval for viscosupplementation vs arthroscopy vs TKR THANKS! Denny Levy

## 2013-05-21 NOTE — ED Notes (Signed)
Pt states she has had dizzy spells for the past three days, pt states she has been nauseous for the past day. Pt states she has not loss consciousness but that she almost passed out one time. Pt states she has upper abdominal pain that travels to her back. Pt denies any problems urinating or having bm. Pt states she feels dizzy when she bends over and stands back up. Pt states she feels very shaky.

## 2013-05-21 NOTE — ED Notes (Signed)
Pt denies dizziness, she feels lightheaded when she sits up. Denies feeling light headed at this time.

## 2013-05-21 NOTE — Progress Notes (Signed)
  Subjective:    Patient ID: [name], female    DOB: October 23, 1956, 57 y.o.   MRN: [id number]  HPI  Here for evaluation right knee pain. It started fairly acutely, one night she got to the bathroom in [place] knee was hurting. This is about 4 or 5 weeks ago. Since then she's had fairly constant pain. She's also had some swelling. Has had no specific injury. Does note she's had some mild intermittent right knee pain over the years but nothing like this. Denies calf tenderness. No numbness in the right lower extremity.  MRI: Most notably she has some thinning of the subchondral bone of the tibia with some edema. Review of Systems No unusual weight gain or loss, no fever. He has had swelling but no erythema or warmth. No locking or giving way of the knee.    Objective:   Physical Exam Vital signs are reviewed GENERAL: Well-developed overweight female no acute distress KNEE: Right. Slight effusion although this is somewhat difficult to determine secondary to habitus. Flexion extension is full. Medial joint line tenderness. Lateral joint line is nontender. Ligaments intact to varus and valgus stress. Popliteal space is benign. Lautman is normal. Calf is soft. Distally neurovascularly intact.       Assessment & Plan:  #1. Knee pain with MRI findings of subchondral bone edema in thinning articular cartilage of the tibia. She also has some degenerative changes in the menisci. This all looks fairly chronic. Unclear why she would have recent acute pain but probably result of long-standing degenerative process. We discussed weight loss. We also discussed other options including corticosteroid injection, discussed supplementation, arthroscopy. She would like to see an orthopedic specialist for further evaluation and treatment I will set that up.

## 2013-05-21 NOTE — ED Notes (Signed)
MD at bedside. 

## 2013-05-21 NOTE — ED Provider Notes (Signed)
History     CSN: [id number]  Arrival date & time 05/21/13  1219   First MD Initiated Contact with Patient 05/21/13 1236      Chief Complaint  Patient presents with  . Dizziness  . Nausea    (Consider location/radiation/quality/duration/timing/severity/associated sxs/prior treatment) HPI Pt has been taking 800 mg ibuprofen TID, naproxen and aspirin. She began having epigastric pain radiating to back 2 days ago associated with nausea. + acidic taste in throat. +lightheadedness when quickly changing positions. No melena or gross blood in stool. No fever chills. No chest pain. No lower ext swelling. No hearing changes.  Past Medical History  Diagnosis Date  . Chest pain     a. s/p reported MI in 1997;  b. 09/2012 Cath: nl cors, nl LV ([place]);  c. 05/2012 Non-ischemic myoview.  . Hypertension   . Arthritis   . Problem with literacy   . Shortness of breath   . Anxiety   . Bronchitis   . Chronic headaches   . Obesity   . COPD (chronic obstructive pulmonary disease)   . Chronic diastolic CHF (congestive heart failure)     a. 12/2011 Echo: nl ef, Gr 1 DD;  b. 11/2012 Echo: EF 45-50%, no significant valvular abnormalities.  Marland Kitchen GERD (gastroesophageal reflux disease)   . Myocardial infarction 1997    self reported  . Hypothyroidism   . Asthma   . Chronic chest pain     Past Surgical History  Procedure Laterality Date  . Ectopic pregnancy surgery      right  . Nm myoview ltd  June 2013    no reversible ischemia  . Cardiac catheterization  October 2012    No coronary artery disease  . Cardiac catheterization  2012    normal coronary arteries  . Nm myoview ltd  2012, 2013    normal  . 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 Cancer Mother 69    lung ca  . Diabetes Mother   . Anxiety disorder Sister   . Cancer Brother 60    lung    History  Substance Use Topics  . Smoking status: Former  Smoker -- 1.00 packs/day for 3 years    Quit date: 12/03/2010  . Smokeless tobacco: Never Used  . Alcohol Use: No    OB History   Grav Para Term Preterm Abortions TAB SAB Ect Mult Living                  Review of Systems  Constitutional: Negative for fever and chills.  HENT: Negative for neck pain.   Respiratory: Negative for shortness of breath.   Cardiovascular: Negative for chest pain.  Gastrointestinal: Positive for nausea and abdominal pain. Negative for vomiting, diarrhea and blood in stool.  Genitourinary: Negative for dysuria and flank pain.  Musculoskeletal: Negative for back pain.  Skin: Negative for rash and wound.  Neurological: Positive for dizziness and light-headedness. Negative for syncope, weakness, numbness and headaches.  All other systems reviewed and are negative.    Allergies  Penicillins  Home Medications   Current Outpatient Rx  Name  Route  Sig  Dispense  Refill  . aspirin EC 81 MG tablet   Oral   Take 81 mg by mouth daily.         Marland Kitchen atorvastatin (LIPITOR) 10 MG tablet   Oral  Take 1 tablet (10 mg total) by mouth daily.   30 tablet   1   . levothyroxine (SYNTHROID, LEVOTHROID) 50 MCG tablet   Oral   Take 1 tablet (50 mcg total) by mouth daily.   30 tablet   1   . metoprolol succinate (TOPROL-XL) 25 MG 24 hr tablet   Oral   Take 1 tablet (25 mg total) by mouth daily.   30 tablet   1   . oxyCODONE-acetaminophen (PERCOCET/ROXICET) 5-325 MG per tablet   Oral   Take 2 tablets by mouth every 4 (four) hours as needed for pain.   10 tablet   0   . PARoxetine (PAXIL) 20 MG tablet   Oral   Take 1 tablet (20 mg total) by mouth every morning.   30 tablet   1   . albuterol (PROVENTIL HFA;VENTOLIN HFA) 108 (90 BASE) MCG/ACT inhaler   Inhalation   Inhale 2 puffs into the lungs every 4 (four) hours as needed for wheezing or shortness of breath (cough).   1 Inhaler   1   . HYDROcodone-acetaminophen (NORCO) 5-325 MG per tablet    Oral   Take 1 tablet by mouth every 4 (four) hours as needed for pain.   10 tablet   0   . lansoprazole (PREVACID) 30 MG capsule   Oral   Take 1 capsule (30 mg total) by mouth daily.   30 capsule   0   . ondansetron (ZOFRAN ODT) 4 MG disintegrating tablet      4mg  ODT q4 hours prn nausea/vomit   8 tablet   0     BP 147/63  Pulse 53  Temp(Src) 97.8 F (36.6 C) (Oral)  Resp 11  Ht 5\' 8"  (1.727 m)  Wt 200 lb (90.719 kg)  BMI 30.42 kg/m2  SpO2 100%  Physical Exam  Nursing note and vitals reviewed. Constitutional: She is oriented to person, place, and time. She appears well-developed and well-nourished. No distress.  HENT:  Head: Normocephalic and atraumatic.  Mouth/Throat: Oropharynx is clear and moist.  Eyes: EOM are normal. Pupils are equal, round, and reactive to light.  No nystagmus   Neck: Normal range of motion. Neck supple.  Cardiovascular: Normal rate and regular rhythm.   Pulmonary/Chest: Effort normal and breath sounds normal. No respiratory distress. She has no wheezes. She has no rales. She exhibits no tenderness.  Abdominal: Soft. Bowel sounds are normal. She exhibits no distension and no mass. There is tenderness (epigastric tenderness to palpation). There is no rebound and no guarding.  Musculoskeletal: Normal range of motion. She exhibits no edema and no tenderness.  No pitting edema  Neurological: She is alert and oriented to person, place, and time.  5/5 motor in all ext, sensation  Skin: Skin is warm and dry. No rash noted. No erythema.  Psychiatric: She has a normal mood and affect. Her behavior is normal.    ED Course  Procedures (including critical care time)  Labs Reviewed  CBC - Abnormal; Notable for the following:    Hemoglobin 11.7 (*)    HCT 35.2 (*)    All other components within normal limits  URINALYSIS, ROUTINE W REFLEX MICROSCOPIC - Abnormal; Notable for the following:    Leukocytes, UA TRACE (*)    All other components within  normal limits  COMPREHENSIVE METABOLIC PANEL  LIPASE, BLOOD  TROPONIN I  URINE MICROSCOPIC-ADD ON   Dg Chest 2 View  05/21/2013   *RADIOLOGY REPORT*  Clinical Data:  Shortness of breath and dizziness  CHEST - 2 VIEW  Comparison: 05/12/2013  Findings: The lungs are clear without focal infiltrate, edema, pneumothorax or pleural effusion. Interstitial markings are diffusely coarsened with chronic features. Cardiopericardial silhouette is at upper limits of normal for size. Imaged bony structures of the thorax are intact.  IMPRESSION: Stable.  No acute cardiopulmonary findings.   Original Report Authenticated By: Kennith Center, M.D.     1. NSAID induced gastritis, initial encounter   2. Positional lightheadedness       MDM   Pt states she is feeling much better after meds. Advised to stop taking NSAIDs other than baby aspirin. Pt given GI f/u for persistent symptom and given return precautions. Voiced understanding.        Loren Racer, MD 05/21/13 1431

## 2013-05-23 ENCOUNTER — Inpatient Hospital Stay (HOSPITAL_COMMUNITY)
Admission: EM | Admit: 2013-05-23 | Discharge: 2013-05-26 | DRG: 389 | Disposition: A | Discharge status: Home / Self Care | Payer: Medicaid Other | Attending: Family Medicine | Admitting: Family Medicine

## 2013-05-23 DIAGNOSIS — F419 Anxiety disorder, unspecified: Secondary | ICD-10-CM | POA: Diagnosis present

## 2013-05-23 DIAGNOSIS — T4275XA Adverse effect of unspecified antiepileptic and sedative-hypnotic drugs, initial encounter: Secondary | ICD-10-CM | POA: Diagnosis present

## 2013-05-23 DIAGNOSIS — J449 Chronic obstructive pulmonary disease, unspecified: Secondary | ICD-10-CM | POA: Diagnosis present

## 2013-05-23 DIAGNOSIS — Z801 Family history of malignant neoplasm of trachea, bronchus and lung: Secondary | ICD-10-CM

## 2013-05-23 DIAGNOSIS — Z6835 Body mass index (BMI) 35.0-35.9, adult: Secondary | ICD-10-CM

## 2013-05-23 DIAGNOSIS — E785 Hyperlipidemia, unspecified: Secondary | ICD-10-CM | POA: Diagnosis present

## 2013-05-23 DIAGNOSIS — K56609 Unspecified intestinal obstruction, unspecified as to partial versus complete obstruction: Principal | ICD-10-CM | POA: Diagnosis present

## 2013-05-23 DIAGNOSIS — I252 Old myocardial infarction: Secondary | ICD-10-CM

## 2013-05-23 DIAGNOSIS — E039 Hypothyroidism, unspecified: Secondary | ICD-10-CM | POA: Diagnosis present

## 2013-05-23 DIAGNOSIS — Z87891 Personal history of nicotine dependence: Secondary | ICD-10-CM

## 2013-05-23 DIAGNOSIS — E669 Obesity, unspecified: Secondary | ICD-10-CM | POA: Diagnosis present

## 2013-05-23 DIAGNOSIS — I5032 Chronic diastolic (congestive) heart failure: Secondary | ICD-10-CM | POA: Diagnosis present

## 2013-05-23 DIAGNOSIS — K219 Gastro-esophageal reflux disease without esophagitis: Secondary | ICD-10-CM | POA: Diagnosis present

## 2013-05-23 DIAGNOSIS — M549 Dorsalgia, unspecified: Secondary | ICD-10-CM | POA: Diagnosis present

## 2013-05-23 DIAGNOSIS — K5909 Other constipation: Secondary | ICD-10-CM | POA: Diagnosis present

## 2013-05-23 DIAGNOSIS — G8929 Other chronic pain: Secondary | ICD-10-CM | POA: Diagnosis present

## 2013-05-23 DIAGNOSIS — K5900 Constipation, unspecified: Secondary | ICD-10-CM | POA: Diagnosis present

## 2013-05-23 DIAGNOSIS — I10 Essential (primary) hypertension: Secondary | ICD-10-CM | POA: Diagnosis present

## 2013-05-23 DIAGNOSIS — F411 Generalized anxiety disorder: Secondary | ICD-10-CM | POA: Diagnosis present

## 2013-05-23 DIAGNOSIS — E876 Hypokalemia: Secondary | ICD-10-CM | POA: Diagnosis present

## 2013-05-23 DIAGNOSIS — I509 Heart failure, unspecified: Secondary | ICD-10-CM | POA: Diagnosis present

## 2013-05-23 DIAGNOSIS — J4489 Other specified chronic obstructive pulmonary disease: Secondary | ICD-10-CM | POA: Diagnosis present

## 2013-05-23 MED ORDER — GI COCKTAIL ~~LOC~~
30.0000 mL | Freq: Once | ORAL | Status: AC
  Administered 2013-05-24: 30 mL via ORAL
  Filled 2013-05-23: qty 30

## 2013-05-23 MED ORDER — FENTANYL CITRATE 0.05 MG/ML IJ SOLN
50.0000 ug | Freq: Once | INTRAMUSCULAR | Status: AC
  Administered 2013-05-24: 50 ug via INTRAVENOUS
  Filled 2013-05-23: qty 2

## 2013-05-23 MED ORDER — SODIUM CHLORIDE 0.9 % IV BOLUS (SEPSIS)
500.0000 mL | Freq: Once | INTRAVENOUS | Status: AC
  Administered 2013-05-24: 500 mL via INTRAVENOUS

## 2013-05-23 MED ORDER — FAMOTIDINE IN NACL 20-0.9 MG/50ML-% IV SOLN
20.0000 mg | Freq: Once | INTRAVENOUS | Status: AC
  Administered 2013-05-24: 20 mg via INTRAVENOUS
  Filled 2013-05-23: qty 50

## 2013-05-23 NOTE — ED Notes (Signed)
The patient c/o epigastric pain x 1 hour.  She states that she is having epigastric pain which she describes as sharp (not burning).  She states that she was recently diagnosed with GERD and given a prescription for an acid reducer, however she never filled her script.  She also states that she is taking Vicodin for her back pain and that she has not had a BM x 2 days.

## 2013-05-23 NOTE — ED Provider Notes (Signed)
History     CSN: [id number]  Arrival date & time 05/23/13  2316   First MD Initiated Contact with Patient 05/23/13 2348      Chief Complaint  Patient presents with  . Abdominal Pain    epigastric pain    (Consider location/radiation/quality/duration/timing/severity/associated sxs/prior treatment) Patient is a 57 y.o. female presenting with abdominal pain. The history is provided by the patient. No language interpreter was used.  Abdominal Pain This is a recurrent problem. The current episode started 1 to 2 hours ago. The problem occurs constantly. The problem has not changed since onset.Associated symptoms include abdominal pain. Pertinent negatives include no chest pain. Nothing aggravates the symptoms. Nothing relieves the symptoms. She has tried nothing for the symptoms. The treatment provided no relief.  Ate beans, collard greens and hamburger helper now having gas and LUQ/epigastric pain.  Also no BM in several days as is on vicodin and percocet for chronic pain.  Has been passing flatus but not a lot  Past Medical History  Diagnosis Date  . Chest pain     a. s/p reported MI in 1997;  b. 09/2012 Cath: nl cors, nl LV ([place]);  c. 05/2012 Non-ischemic myoview.  . Hypertension   . Arthritis   . Problem with literacy   . Shortness of breath   . Anxiety   . Bronchitis   . Chronic headaches   . Obesity   . COPD (chronic obstructive pulmonary disease)   . Chronic diastolic CHF (congestive heart failure)     a. 12/2011 Echo: nl ef, Gr 1 DD;  b. 11/2012 Echo: EF 45-50%, no significant valvular abnormalities.  Marland Kitchen GERD (gastroesophageal reflux disease)   . Myocardial infarction 1997    self reported  . Hypothyroidism   . Asthma   . Chronic chest pain     Past Surgical History  Procedure Laterality Date  . Ectopic pregnancy surgery      right  . Nm myoview ltd  June 2013    no reversible ischemia  . Cardiac catheterization  October 2012    No coronary artery disease  .  Cardiac catheterization  2012    normal coronary arteries  . Nm myoview ltd  2012, 2013    normal  . 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 Cancer Mother 72    lung ca  . Diabetes Mother   . Anxiety disorder Sister   . Cancer Brother 60    lung    History  Substance Use Topics  . Smoking status: Former Smoker -- 1.00 packs/day for 3 years    Quit date: 12/03/2010  . Smokeless tobacco: Never Used  . Alcohol Use: No    OB History   Grav Para Term Preterm Abortions TAB SAB Ect Mult Living                  Review of Systems  Cardiovascular: Negative for chest pain.  Gastrointestinal: Positive for abdominal pain and constipation. Negative for nausea, diarrhea and rectal pain.  All other systems reviewed and are negative.    Allergies  Penicillins  Home Medications   Current Outpatient Rx  Name  Route  Sig  Dispense  Refill  . albuterol (PROVENTIL HFA;VENTOLIN HFA) 108 (90 BASE) MCG/ACT inhaler   Inhalation   Inhale 2 puffs into the lungs every 4 (four) hours as  needed for wheezing or shortness of breath (cough).   1 Inhaler   1   . aspirin EC 81 MG tablet   Oral   Take 81 mg by mouth daily.         Marland Kitchen atorvastatin (LIPITOR) 10 MG tablet   Oral   Take 1 tablet (10 mg total) by mouth daily.   30 tablet   1   . HYDROcodone-acetaminophen (NORCO) 5-325 MG per tablet   Oral   Take 1 tablet by mouth every 4 (four) hours as needed for pain.   10 tablet   0   . lansoprazole (PREVACID) 30 MG capsule   Oral   Take 1 capsule (30 mg total) by mouth daily.   30 capsule   0   . levothyroxine (SYNTHROID, LEVOTHROID) 50 MCG tablet   Oral   Take 1 tablet (50 mcg total) by mouth daily.   30 tablet   1   . metoprolol succinate (TOPROL-XL) 25 MG 24 hr tablet   Oral   Take 1 tablet (25 mg total) by mouth daily.   30 tablet   1   . ondansetron (ZOFRAN ODT) 4 MG disintegrating  tablet      4mg  ODT q4 hours prn nausea/vomit   8 tablet   0   . oxyCODONE-acetaminophen (PERCOCET/ROXICET) 5-325 MG per tablet   Oral   Take 2 tablets by mouth every 4 (four) hours as needed for pain.   10 tablet   0   . PARoxetine (PAXIL) 20 MG tablet   Oral   Take 1 tablet (20 mg total) by mouth every morning.   30 tablet   1     BP 135/79  Pulse 87  Temp(Src) 98.4 F (36.9 C) (Oral)  Resp 20  SpO2 96%  Physical Exam  Constitutional: She is oriented to person, place, and time. She appears well-developed and well-nourished. No distress.  HENT:  Head: Normocephalic and atraumatic.  Mouth/Throat: Oropharynx is clear and moist.  Eyes: Conjunctivae are normal. Pupils are equal, round, and reactive to light.  Neck: Normal range of motion. Neck supple.  Cardiovascular: Normal rate, regular rhythm and intact distal pulses.   Pulmonary/Chest: Effort normal and breath sounds normal. She has no wheezes. She has no rales.  Abdominal: Soft. Bowel sounds are increased. There is no tenderness. There is no rigidity, no rebound, no guarding, no tenderness at McBurney's point and negative Murphy's sign.  Musculoskeletal: Normal range of motion.  Neurological: She is alert and oriented to person, place, and time.  Skin: Skin is warm and dry.  Psychiatric: She has a normal mood and affect.    ED Course  Procedures (including critical care time)  Labs Reviewed  CBC WITH DIFFERENTIAL  HEPATIC FUNCTION PANEL  LIPASE, BLOOD   No results found.   No diagnosis found.    MDM  GERd and gas.  Need to fill PPI already written and start on a bowel regimen since she is on chronic narcotic medication       Date: 05/24/2013  Rate: 75  Rhythm: normal sinus rhythm  QRS Axis: normal  Intervals: normal  ST/T Wave abnormalities: normal  Conduction Disutrbances: none  Narrative Interpretation: unremarkable      Eryx Zane K Aisling Emigh-Rasch, MD 05/24/13 [phone]

## 2013-05-24 ENCOUNTER — Emergency Department (HOSPITAL_COMMUNITY): Payer: Medicaid Other

## 2013-05-24 ENCOUNTER — Encounter (HOSPITAL_COMMUNITY): Payer: Self-pay | Admitting: Surgery

## 2013-05-24 DIAGNOSIS — R109 Unspecified abdominal pain: Secondary | ICD-10-CM

## 2013-05-24 DIAGNOSIS — E669 Obesity, unspecified: Secondary | ICD-10-CM

## 2013-05-24 DIAGNOSIS — K56609 Unspecified intestinal obstruction, unspecified as to partial versus complete obstruction: Secondary | ICD-10-CM | POA: Diagnosis present

## 2013-05-24 DIAGNOSIS — R112 Nausea with vomiting, unspecified: Secondary | ICD-10-CM

## 2013-05-24 LAB — HEPATIC FUNCTION PANEL
ALT: 15 U/L (ref 0–35)
AST: 21 U/L (ref 0–37)
Alkaline Phosphatase: 90 U/L (ref 39–117)
Total Protein: 7.1 g/dL (ref 6.0–8.3)

## 2013-05-24 LAB — BASIC METABOLIC PANEL
BUN: 16 mg/dL (ref 6–23)
Chloride: 104 mEq/L (ref 96–112)
Glucose, Bld: 155 mg/dL — ABNORMAL HIGH (ref 70–99)
Potassium: 4.8 mEq/L (ref 3.5–5.1)
Sodium: 139 mEq/L (ref 135–145)

## 2013-05-24 LAB — CBC WITH DIFFERENTIAL/PLATELET
Basophils Absolute: 0.1 10*3/uL (ref 0.0–0.1)
HCT: 36.4 % (ref 36.0–46.0)
Lymphocytes Relative: 24 % (ref 12–46)
Lymphs Abs: 2 10*3/uL (ref 0.7–4.0)
MCV: 83.9 fL (ref 78.0–100.0)
Neutro Abs: 5.2 10*3/uL (ref 1.7–7.7)
Platelets: 242 10*3/uL (ref 150–400)
RBC: 4.34 MIL/uL (ref 3.87–5.11)
RDW: 14 % (ref 11.5–15.5)
WBC: 8.1 10*3/uL (ref 4.0–10.5)

## 2013-05-24 LAB — CBC
HCT: 41.2 % (ref 36.0–46.0)
Hemoglobin: 13.6 g/dL (ref 12.0–15.0)
MCH: 28.2 pg (ref 26.0–34.0)
MCHC: 33 g/dL (ref 30.0–36.0)
RBC: 4.82 MIL/uL (ref 3.87–5.11)

## 2013-05-24 LAB — POCT I-STAT, CHEM 8
BUN: 15 mg/dL (ref 6–23)
Calcium, Ion: 1.2 mmol/L (ref 1.12–1.23)
Chloride: 105 mEq/L (ref 96–112)
Creatinine, Ser: 1 mg/dL (ref 0.50–1.10)
Glucose, Bld: 101 mg/dL — ABNORMAL HIGH (ref 70–99)
Potassium: 4.1 mEq/L (ref 3.5–5.1)

## 2013-05-24 LAB — LACTIC ACID, PLASMA: Lactic Acid, Venous: 2 mmol/L (ref 0.5–2.2)

## 2013-05-24 MED ORDER — MORPHINE SULFATE 4 MG/ML IJ SOLN
4.0000 mg | Freq: Once | INTRAMUSCULAR | Status: AC
  Administered 2013-05-24: 4 mg via INTRAVENOUS
  Filled 2013-05-24 (×2): qty 1

## 2013-05-24 MED ORDER — ONDANSETRON HCL 4 MG/2ML IJ SOLN: 4.0000 mg | Freq: Three times a day (TID) | INTRAMUSCULAR | Status: DC | PRN

## 2013-05-24 MED ORDER — DOCUSATE SODIUM 100 MG PO CAPS: 100.0000 mg | ORAL_CAPSULE | Freq: Two times a day (BID) | ORAL | Status: DC

## 2013-05-24 MED ORDER — ONDANSETRON HCL 4 MG/2ML IJ SOLN
INTRAMUSCULAR | Status: AC
  Filled 2013-05-24: qty 2

## 2013-05-24 MED ORDER — PANTOPRAZOLE SODIUM 40 MG IV SOLR
40.0000 mg | Freq: Every day | INTRAVENOUS | Status: DC
  Administered 2013-05-24 – 2013-05-25 (×2): 40 mg via INTRAVENOUS
  Filled 2013-05-24 (×2): qty 40

## 2013-05-24 MED ORDER — ALBUTEROL SULFATE HFA 108 (90 BASE) MCG/ACT IN AERS
2.0000 | INHALATION_SPRAY | RESPIRATORY_TRACT | Status: DC | PRN
  Filled 2013-05-24: qty 6.7

## 2013-05-24 MED ORDER — METOPROLOL SUCCINATE ER 25 MG PO TB24: 25.0000 mg | ORAL_TABLET | Freq: Every day | ORAL | Status: DC

## 2013-05-24 MED ORDER — MORPHINE SULFATE 2 MG/ML IJ SOLN
1.0000 mg | INTRAMUSCULAR | Status: DC | PRN
  Filled 2013-05-24: qty 1

## 2013-05-24 MED ORDER — SODIUM CHLORIDE 0.9 % IJ SOLN
3.0000 mL | Freq: Two times a day (BID) | INTRAMUSCULAR | Status: DC
  Administered 2013-05-25 – 2013-05-26 (×2): 3 mL via INTRAVENOUS

## 2013-05-24 MED ORDER — SORBITOL 70 % SOLN
960.0000 mL | TOPICAL_OIL | Freq: Once | ORAL | Status: AC
  Administered 2013-05-24: 960 mL via RECTAL
  Filled 2013-05-24: qty 240

## 2013-05-24 MED ORDER — LEVOTHYROXINE SODIUM 50 MCG PO TABS
50.0000 ug | ORAL_TABLET | Freq: Every day | ORAL | Status: DC
  Administered 2013-05-24: 50 ug via ORAL
  Filled 2013-05-24: qty 1

## 2013-05-24 MED ORDER — SODIUM CHLORIDE 0.9 % IV SOLN
INTRAVENOUS | Status: DC
  Administered 2013-05-24: 08:00:00 via INTRAVENOUS

## 2013-05-24 MED ORDER — ONDANSETRON HCL 4 MG/2ML IJ SOLN
4.0000 mg | Freq: Once | INTRAMUSCULAR | Status: AC
  Administered 2013-05-24: 4 mg via INTRAVENOUS

## 2013-05-24 MED ORDER — ASPIRIN 300 MG RE SUPP
150.0000 mg | Freq: Every day | RECTAL | Status: DC
  Administered 2013-05-24 – 2013-05-25 (×2): 150 mg via RECTAL
  Filled 2013-05-24 (×2): qty 1

## 2013-05-24 MED ORDER — ATORVASTATIN CALCIUM 10 MG PO TABS: 10.0000 mg | ORAL_TABLET | Freq: Every day | ORAL | Status: DC

## 2013-05-24 MED ORDER — MORPHINE SULFATE 4 MG/ML IJ SOLN
4.0000 mg | Freq: Once | INTRAMUSCULAR | Status: AC
  Administered 2013-05-24: 4 mg via INTRAVENOUS
  Filled 2013-05-24: qty 1

## 2013-05-24 MED ORDER — PAROXETINE HCL 20 MG PO TABS
20.0000 mg | ORAL_TABLET | ORAL | Status: DC
  Administered 2013-05-24: 20 mg via ORAL
  Filled 2013-05-24 (×2): qty 1

## 2013-05-24 MED ORDER — ACETAMINOPHEN 650 MG RE SUPP: 650.0000 mg | Freq: Four times a day (QID) | RECTAL | Status: DC | PRN

## 2013-05-24 MED ORDER — ACETAMINOPHEN 325 MG PO TABS: 650.0000 mg | ORAL_TABLET | Freq: Four times a day (QID) | ORAL | Status: DC | PRN

## 2013-05-24 MED ORDER — PROMETHAZINE HCL 25 MG/ML IJ SOLN: 12.5000 mg | INTRAMUSCULAR | Status: DC | PRN

## 2013-05-24 MED ORDER — SODIUM CHLORIDE 0.45 % IV SOLN
INTRAVENOUS | Status: DC
  Administered 2013-05-24 – 2013-05-25 (×3): via INTRAVENOUS
  Filled 2013-05-24 (×5): qty 1000

## 2013-05-24 MED ORDER — METOPROLOL TARTRATE 1 MG/ML IV SOLN
5.0000 mg | Freq: Three times a day (TID) | INTRAVENOUS | Status: DC
  Administered 2013-05-24 – 2013-05-25 (×4): 5 mg via INTRAVENOUS
  Filled 2013-05-24 (×7): qty 5

## 2013-05-24 MED ORDER — HEPARIN SODIUM (PORCINE) 5000 UNIT/ML IJ SOLN
5000.0000 [IU] | Freq: Three times a day (TID) | INTRAMUSCULAR | Status: DC
  Administered 2013-05-24 – 2013-05-26 (×7): 5000 [IU] via SUBCUTANEOUS
  Filled 2013-05-24 (×10): qty 1

## 2013-05-24 MED ORDER — SENNA 8.6 MG PO TABS: 1.0000 | ORAL_TABLET | Freq: Two times a day (BID) | ORAL | Status: DC

## 2013-05-24 MED ORDER — PROMETHAZINE HCL 25 MG/ML IJ SOLN
25.0000 mg | Freq: Once | INTRAMUSCULAR | Status: AC
  Administered 2013-05-24: 25 mg via INTRAVENOUS
  Filled 2013-05-24: qty 1

## 2013-05-24 MED ORDER — IOHEXOL 300 MG/ML  SOLN
100.0000 mL | Freq: Once | INTRAMUSCULAR | Status: AC | PRN
  Administered 2013-05-24: 100 mL via INTRAVENOUS

## 2013-05-24 MED ORDER — PANTOPRAZOLE SODIUM 40 MG PO TBEC: 40.0000 mg | DELAYED_RELEASE_TABLET | Freq: Every day | ORAL | Status: DC

## 2013-05-24 MED ORDER — ASPIRIN EC 81 MG PO TBEC: 81.0000 mg | DELAYED_RELEASE_TABLET | Freq: Every day | ORAL | Status: DC

## 2013-05-24 MED ORDER — ONDANSETRON 4 MG PO TBDP
4.0000 mg | ORAL_TABLET | Freq: Three times a day (TID) | ORAL | Status: DC | PRN
  Filled 2013-05-24: qty 1

## 2013-05-24 NOTE — Consult Note (Signed)
Reason for Consult:p SBO Referring Physician: April Palombo  [name] is an 57 y.o. female.  HPI:  Pt is a 57 yo F who presents with around 1 day of diffuse abdominal pain, bloating, nausea and vomiting.  She does not recall when she has last had flatus.  She also does not remember her last BM, but thinks it may have been over 4-5 days and maybe a week.  She has not had sick contacts.  She denies unusual travel.  She recently complained of heartburn and was placed on Prevacid.  She denies fever/ chills at home, is 99.9 here.  She has never had colonoscopy.  She had no family history of colon cancer or abdominal cancers.  Her grandmother had lung cancer.  The only thing that has helped her symptoms is the pain medication she got here.    Past Medical History  Diagnosis Date  . Chest pain     a. s/p reported MI in 1997;  b. 09/2012 Cath: nl cors, nl LV ([place]);  c. 05/2012 Non-ischemic myoview.  . Hypertension   . Arthritis   . Problem with literacy   . Shortness of breath   . Anxiety   . Bronchitis   . Chronic headaches   . Obesity   . COPD (chronic obstructive pulmonary disease)   . Chronic diastolic CHF (congestive heart failure)     a. 12/2011 Echo: nl ef, Gr 1 DD;  b. 11/2012 Echo: EF 45-50%, no significant valvular abnormalities.  Marland Kitchen GERD (gastroesophageal reflux disease)   . Myocardial infarction 1997    self reported  . Hypothyroidism   . Asthma   . Chronic chest pain     Past Surgical History  Procedure Laterality Date  . Ectopic pregnancy surgery      right  . Nm myoview ltd  June 2013    no reversible ischemia  . Cardiac catheterization  October 2012    No coronary artery disease  . Cardiac catheterization  2012    normal coronary arteries  . Nm myoview ltd  2012, 2013    normal  . 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 Cancer Mother 74    lung ca  . Diabetes Mother    . Anxiety disorder Sister   . Cancer Brother 40    lung    Social History:  reports that she quit smoking about 2 years ago. She has never used smokeless tobacco. She reports that she does not drink alcohol or use illicit drugs.  Allergies:  Allergies  Allergen Reactions  . Penicillins Rash    Medications:  Prior to Admission:  Prescriptions prior to admission  Medication Sig Dispense Refill  . albuterol (PROVENTIL HFA;VENTOLIN HFA) 108 (90 BASE) MCG/ACT inhaler Inhale 2 puffs into the lungs every 4 (four) hours as needed for wheezing or shortness of breath (cough).  1 Inhaler  1  . aspirin EC 81 MG tablet Take 81 mg by mouth daily.      Marland Kitchen atorvastatin (LIPITOR) 10 MG tablet Take 1 tablet (10 mg total) by mouth daily.  30 tablet  1  . HYDROcodone-acetaminophen (NORCO) 5-325 MG per tablet Take 1 tablet by mouth every 4 (four) hours as needed for pain.  10 tablet  0  . lansoprazole (PREVACID) 30 MG capsule Take 1 capsule (30 mg total) by mouth daily.  30 capsule  0  . levothyroxine (SYNTHROID, LEVOTHROID) 50 MCG tablet Take 1 tablet (50 mcg total) by mouth daily.  30 tablet  1  . metoprolol succinate (TOPROL-XL) 25 MG 24 hr tablet Take 1 tablet (25 mg total) by mouth daily.  30 tablet  1  . ondansetron (ZOFRAN ODT) 4 MG disintegrating tablet 4mg  ODT q4 hours prn nausea/vomit  8 tablet  0  . oxyCODONE-acetaminophen (PERCOCET/ROXICET) 5-325 MG per tablet Take 2 tablets by mouth every 4 (four) hours as needed for pain.  10 tablet  0  . PARoxetine (PAXIL) 20 MG tablet Take 1 tablet (20 mg total) by mouth every morning.  30 tablet  1    Results for orders placed during the hospital encounter of 05/23/13 (from the past 48 hour(s))  CBC WITH DIFFERENTIAL     Status: None   Collection Time    05/23/13 11:50 PM      Result Value Range   WBC 8.1  4.0 - 10.5 K/uL   RBC 4.34  3.87 - 5.11 MIL/uL   Hemoglobin 12.6  12.0 - 15.0 g/dL   HCT 16.1  09.6 - 04.5 %   MCV 83.9  78.0 - 100.0 fL   MCH  29.0  26.0 - 34.0 pg   MCHC 34.6  30.0 - 36.0 g/dL   RDW 40.9  81.1 - 91.4 %   Platelets 242  150 - 400 K/uL   Neutrophils Relative % 64  43 - 77 %   Neutro Abs 5.2  1.7 - 7.7 K/uL   Lymphocytes Relative 24  12 - 46 %   Lymphs Abs 2.0  0.7 - 4.0 K/uL   Monocytes Relative 8  3 - 12 %   Monocytes Absolute 0.7  0.1 - 1.0 K/uL   Eosinophils Relative 3  0 - 5 %   Eosinophils Absolute 0.3  0.0 - 0.7 K/uL   Basophils Relative 1  0 - 1 %   Basophils Absolute 0.1  0.0 - 0.1 K/uL  HEPATIC FUNCTION PANEL     Status: None   Collection Time    05/23/13 11:50 PM      Result Value Range   Total Protein 7.1  6.0 - 8.3 g/dL   Albumin 4.0  3.5 - 5.2 g/dL   AST 21  0 - 37 U/L   ALT 15  0 - 35 U/L   Alkaline Phosphatase 90  39 - 117 U/L   Total Bilirubin 0.3  0.3 - 1.2 mg/dL   Bilirubin, Direct <7.8  0.0 - 0.3 mg/dL   Indirect Bilirubin NOT CALCULATED  0.3 - 0.9 mg/dL  LIPASE, BLOOD     Status: None   Collection Time    05/23/13 11:50 PM      Result Value Range   Lipase 30  11 - 59 U/L  POCT I-STAT TROPONIN I     Status: None   Collection Time    05/24/13 12:06 AM      Result Value Range   Troponin i, poc 0.00  0.00 - 0.08 ng/mL   Comment 3            Comment: Due to the release kinetics of cTnI,     a negative result within the first hours     of the onset of symptoms does not rule out     myocardial infarction with certainty.     If myocardial infarction is still suspected,     repeat the  test at appropriate intervals.  POCT I-STAT, CHEM 8     Status: Abnormal   Collection Time    05/24/13 12:08 AM      Result Value Range   Sodium 141  135 - 145 mEq/L   Potassium 4.1  3.5 - 5.1 mEq/L   Chloride 105  96 - 112 mEq/L   BUN 15  6 - 23 mg/dL   Creatinine, Ser 9.81  0.50 - 1.10 mg/dL   Glucose, Bld 191 (*) 70 - 99 mg/dL   Calcium, Ion 4.78  1.12 - 1.23 mmol/L   TCO2 27  0 - 100 mmol/L   Hemoglobin 12.9  12.0 - 15.0 g/dL   HCT 29.5  62.1 - 30.8 %    Ct Abdomen Pelvis W  Contrast  05/24/2013   *RADIOLOGY REPORT*  Clinical Data: Sharp epigastric pain.  Recent diagnosis of gastroesophageal reflux disease.  CT ABDOMEN AND PELVIS WITH CONTRAST  Technique:  Multidetector CT imaging of the abdomen and pelvis was performed following the standard protocol during bolus administration of intravenous contrast.  Contrast: OMNIPAQUE IOHEXOL 300 MG/ML  SOLN  Comparison: 08/20/2012  Findings: The lung bases are clear.  The liver, spleen, gallbladder, pancreas, adrenal glands, kidneys, abdominal aorta, and retroperitoneal lymph nodes are unremarkable. The inferior vena cava is somewhat flattened which may be seen with dehydration or hypovolemia.  Proximal small bowel dilatation with predominately fluid-filled loops.  Distal small bowel loops are decompressed.  Changes are consistent with small bowel obstruction. Transition zone appears to be in the right lower quadrant.  No specific mass or obstructing lesion is appreciated in changes may be due to adhesions.  No free air or free fluid in the abdomen.  Pelvis:  Bladder wall is not thickened.  Uterus and adnexal structures are not enlarged.  Appendix is not identified but no inflammatory changes are suggested in the right lower quadrant.  No evidence of diverticulitis.  No free or loculated pelvic fluid collections.  No significant pelvic lymphadenopathy.  Degenerative changes in the spine.  No destructive bone lesions appreciated.  IMPRESSION: Small bowel obstruction with transition zone in the right lower abdomen.   Original Report Authenticated By: Burman Nieves, M.D.   Dg Abd Acute W/chest  05/24/2013   *RADIOLOGY REPORT*  Clinical Data: Severe epigastric pain.  Constipation.  ACUTE ABDOMEN SERIES (ABDOMEN 2 VIEW & CHEST 1 VIEW)  Comparison: Chest 05/21/2013.  Abdomen 07/06/2012.  Findings: Shallow inspiration.  Heart size and pulmonary vascularity are normal for technique.  No focal airspace disease or consolidation in the lungs.  No  blunting of costophrenic angles. No pneumothorax.  Mediastinal contours appear intact.  No significant change since previous chest radiograph.  There are mildly distended gas-filled small bowel loops in the left upper quadrant with a few air-fluid levels noted on the upright view.  There is a paucity of gas and stool in the colon.  Changes may represent early obstruction versus inflammatory process.  No free intra-abdominal air.  No radiopaque stones.  Visualized bones appear intact.  IMPRESSION: Mild gaseous distension of left upper quadrant small bowel with a few air-fluid levels suggesting early or partial obstruction versus inflammatory process.  No evidence of active pulmonary disease.   Original Report Authenticated By: Burman Nieves, M.D.    Review of Systems  Constitutional: Negative.   HENT: Negative.   Eyes: Negative.   Respiratory: Negative.   Cardiovascular: Negative.   Gastrointestinal: Positive for heartburn, nausea, vomiting, abdominal pain and constipation. Negative  for diarrhea, blood in stool and melena.  Genitourinary: Negative.   Musculoskeletal: Positive for back pain (chronic).  Skin: Negative.   Neurological: Negative.   Endo/Heme/Allergies: Negative.   Psychiatric/Behavioral:       Very sleepy, difficult to examine.    Blood pressure 115/87, pulse 111, temperature 99.9 F (37.7 C), temperature source Oral, resp. rate 19, SpO2 97.00%. Physical Exam  Constitutional: She appears well-developed and well-nourished. No distress.  HENT:  Head: Normocephalic and atraumatic.  Eyes: Pupils are equal, round, and reactive to light. Right eye exhibits no discharge. Left eye exhibits no discharge. No scleral icterus.  Sl injected conjunctiva   Neck: Neck supple. No tracheal deviation present. No thyromegaly present.  Cardiovascular: Regular rhythm, normal heart sounds and intact distal pulses.  Exam reveals no gallop and no friction rub.   No murmur heard. Tachycardic    Respiratory: Effort normal and breath sounds normal. No respiratory distress. She has no wheezes. She has no rales. She exhibits no tenderness.  GI: Soft. She exhibits distension. She exhibits no mass. There is no tenderness. There is no rebound and no guarding.  Hypoactive bowel sounds   Musculoskeletal: She exhibits no edema and no tenderness.  Lymphadenopathy:    She has no cervical adenopathy.  Neurological:  Very sleepy.  Needed pretty much constant stimulation to stay awake.  Minimal answers to questions   Skin: Skin is warm and dry. No rash noted. She is not diaphoretic. No erythema. No pallor.  Psychiatric: She has a normal mood and affect. Thought content normal.    Assessment/Plan: p SBO  Pt has only had ectopic, but she also is on chronic narcotic use with significant constipation.   Favor placement NGT Bowel rest IVF Will likely need enemas.  No mass apparent on CT.  Pt has not had colonoscopy.  Surgery will follow.    Jadelynn Boylan 05/24/2013, 6:10 AM

## 2013-05-24 NOTE — Progress Notes (Signed)
I have seen and examined the patient and agree with the assessment and plans. Hopefully will resolve without need for surgical intervention  Dorn Hartshorne A. Magnus Ivan  MD, FACS

## 2013-05-24 NOTE — Progress Notes (Signed)
Subjective: Pt's pain better controlled with meds.  Denies any N/V.  Pending NG placement in radiology.  No flatus or BM.  Pt states she's been taking Norco q4h at home due to a knee injury for the last few days.    Objective: Vital signs in last 24 hours: Temp:  [98.4 F (36.9 C)-100.6 F (38.1 C)] 100.6 F (38.1 C) (06/09 0618) Pulse Rate:  [85-130] 130 (06/09 0618) Resp:  [17-20] 20 (06/09 0618) BP: (115-138)/(72-87) 120/87 mmHg (06/09 0618) SpO2:  [95 %-100 %] 95 % (06/09 0618) Weight:  [232 lb 14.4 oz (105.643 kg)] 232 lb 14.4 oz (105.643 kg) (06/09 0618)    Intake/Output from previous day:   Intake/Output this shift:    PE: Gen:  Alert, NAD, pleasant Abd: Soft, moderately tender, ND, diminished BS, no HSM  Lab Results:   Recent Labs  05/21/13 1227 05/23/13 2350 05/24/13 0008  WBC 5.3 8.1  --   HGB 11.7* 12.6 12.9  HCT 35.2* 36.4 38.0  PLT 222 242  --    BMET  Recent Labs  05/21/13 1251 05/24/13 0008  NA 138 141  K 3.8 4.1  CL 105 105  CO2 22  --   GLUCOSE 88 101*  BUN 10 15  CREATININE 0.76 1.00  CALCIUM 8.8  --    PT/INR No results found for this basename: LABPROT, INR,  in the last 72 hours CMP     Component Value Date/Time   NA 141 05/24/2013 0008   K 4.1 05/24/2013 0008   CL 105 05/24/2013 0008   CO2 22 05/21/2013 1251   GLUCOSE 101* 05/24/2013 0008   BUN 15 05/24/2013 0008   CREATININE 1.00 05/24/2013 0008   CREATININE 0.96 12/18/2011 1511   CALCIUM 8.8 05/21/2013 1251   PROT 7.1 05/23/2013 2350   ALBUMIN 4.0 05/23/2013 2350   AST 21 05/23/2013 2350   ALT 15 05/23/2013 2350   ALKPHOS 90 05/23/2013 2350   BILITOT 0.3 05/23/2013 2350   GFRNONAA >90 05/21/2013 1251   GFRAA >90 05/21/2013 1251   Lipase     Component Value Date/Time   LIPASE 30 05/23/2013 2350       Studies/Results: Ct Abdomen Pelvis W Contrast  05/24/2013   *RADIOLOGY REPORT*  Clinical Data: Sharp epigastric pain.  Recent diagnosis of gastroesophageal reflux disease.  CT ABDOMEN AND PELVIS  WITH CONTRAST  Technique:  Multidetector CT imaging of the abdomen and pelvis was performed following the standard protocol during bolus administration of intravenous contrast.  Contrast: OMNIPAQUE IOHEXOL 300 MG/ML  SOLN  Comparison: 08/20/2012  Findings: The lung bases are clear.  The liver, spleen, gallbladder, pancreas, adrenal glands, kidneys, abdominal aorta, and retroperitoneal lymph nodes are unremarkable. The inferior vena cava is somewhat flattened which may be seen with dehydration or hypovolemia.  Proximal small bowel dilatation with predominately fluid-filled loops.  Distal small bowel loops are decompressed.  Changes are consistent with small bowel obstruction. Transition zone appears to be in the right lower quadrant.  No specific mass or obstructing lesion is appreciated in changes may be due to adhesions.  No free air or free fluid in the abdomen.  Pelvis:  Bladder wall is not thickened.  Uterus and adnexal structures are not enlarged.  Appendix is not identified but no inflammatory changes are suggested in the right lower quadrant.  No evidence of diverticulitis.  No free or loculated pelvic fluid collections.  No significant pelvic lymphadenopathy.  Degenerative changes in the spine.  No  destructive bone lesions appreciated.  IMPRESSION: Small bowel obstruction with transition zone in the right lower abdomen.   Original Report Authenticated By: Burman Nieves, M.D.   Dg Abd Acute W/chest  05/24/2013   *RADIOLOGY REPORT*  Clinical Data: Severe epigastric pain.  Constipation.  ACUTE ABDOMEN SERIES (ABDOMEN 2 VIEW & CHEST 1 VIEW)  Comparison: Chest 05/21/2013.  Abdomen 07/06/2012.  Findings: Shallow inspiration.  Heart size and pulmonary vascularity are normal for technique.  No focal airspace disease or consolidation in the lungs.  No blunting of costophrenic angles. No pneumothorax.  Mediastinal contours appear intact.  No significant change since previous chest radiograph.  There are  mildly distended gas-filled small bowel loops in the left upper quadrant with a few air-fluid levels noted on the upright view.  There is a paucity of gas and stool in the colon.  Changes may represent early obstruction versus inflammatory process.  No free intra-abdominal air.  No radiopaque stones.  Visualized bones appear intact.  IMPRESSION: Mild gaseous distension of left upper quadrant small bowel with a few air-fluid levels suggesting early or partial obstruction versus inflammatory process.  No evidence of active pulmonary disease.   Original Report Authenticated By: Burman Nieves, M.D.    Anti-infectives: Anti-infectives   None       Assessment/Plan pSBO - Pt has only had ectopic surgery, but she also is on chronic narcotic use with significant constipation.  1.  Conservative management with NGT, NPO, IVF, pain control, antiemetics 2.  Ambulation and IS 3.  SCD's and lovenox 4.  Has SMOG enema and dulcolax ordered.  No mass apparent on CT. Pt has not had colonoscopy.  5.  Await return of bowel function, if not improving may need surgery     LOS: 1 day    DORT, Vikrant Pryce 05/24/2013, 8:16 AM Pager: [phone]

## 2013-05-24 NOTE — Progress Notes (Addendum)
[facility] Attending Admission Note: [name] Pager: [phone] I  have seen and examined this patient, reviewed their chart. I have discussed this patient with the resident. I agree with the resident's findings, assessment and care plan.  Briefly: Patient seen and examined by me this morning,still has mild epigastric pain,denies nausea and vomiting,she vomited yesterday,denies diarrhea or constipation.  O/E: Not in distress Neuro: Awake and alert,oriented X 3. Resp: Air entry equal B/L. CV: S1 S2 normal,no murmurs. Abdomen: Soft,mild epigastric tenderness,bowel sound normal. Ext: No edema.  A/P: 57 Y/O female with Pmx opiate dependent,ectopic pregnancy,admitted for;        1. SBO: Agree with NPO and NGT for bowel rest and suctioning.            IV hydration while NPO.            Pain control as needed.            Appreciate surgery evaluation.        2. Chronic condition; Anxiety,COPD,CHF,Hyothyrodism: condition stable since admission.            Continue home regimen.        3. Fever: Temp elevated this morning with elevated wbc.            UA on admission looked good.            Consider repeat UA,blood culture if spike fever again and or wbc worsened.

## 2013-05-24 NOTE — Progress Notes (Signed)
FMTS Daily Intern Progress Note  Subjective: Pt with decreased abdominal pain this morning, about to get NG tube placed.  I have reviewed the patient's medications.  Objective Temp:  [98.4 F (36.9 C)-100.6 F (38.1 C)] 100.6 F (38.1 C) (06/09 0618) Pulse Rate:  [85-130] 130 (06/09 0618) Resp:  [17-20] 20 (06/09 0618) BP: (115-138)/(72-87) 120/87 mmHg (06/09 0618) SpO2:  [95 %-100 %] 95 % (06/09 0618) Weight:  [232 lb 14.4 oz (105.643 kg)] 232 lb 14.4 oz (105.643 kg) (06/09 0618)   Intake/Output Summary (Last 24 hours) at 05/24/13 1143 Last data filed at 05/24/13 0856  Gross per 24 hour  Intake      0 ml  Output    400 ml  Net   -400 ml    CBG (last 3)  No results found for this basename: GLUCAP,  in the last 72 hours  General: NAD, seated in bed with NG tube being placed HEENT: AT/[place], EOMI, sclera clear, nares patent, NG tube being placed in nare, eyes tearing up Pulm: Normal effort Abd: Soft, nontender currently, nondistended, obese, hyperactive bowel sounds Ext: Atraumatic, no cyanosis or edema, obese Neuro: Awake, alert  Labs and Imaging  Recent Labs Lab 05/21/13 1227 05/23/13 2350 05/24/13 0008 05/24/13 0815  WBC 5.3 8.1  --  10.8*  HGB 11.7* 12.6 12.9 13.6  HCT 35.2* 36.4 38.0 41.2  PLT 222 242  --  259     Recent Labs Lab 05/21/13 1251 05/24/13 0008 05/24/13 0815  NA 138 141 139  K 3.8 4.1 4.8  CL 105 105 104  CO2 22  --  24  BUN 10 15 16   CREATININE 0.76 1.00 0.86  GLUCOSE 88 101* 155*  CALCIUM 8.8  --  9.2     Assessment and Plan [name] is a 57 y.o. year old female with h/o opioid pain medication use and distant ectopic pregnancy surgery presenting with sharp abdominal pain.  Small bowel obstruction - Seen on CT abdomen/pelvis. No metabolic derangement (acidosis, alkalosis, electrolyte abnormalities) or leukocytosis. Likely due to opioid pain medication use vs adhesions from distant h/o ectopic surgery. 6/9 at 6 AM pt developed  fever to 100.52F and P 130. General surgery agrees currently with conservative management with bowel rest and NG decompression. 1. NG decompression, NPO, holding PO pain meds (toprol XL, lipitor, sinthroid), ordered IV metoprolol, phenergan, and zofran, and rectal aspirin and tylenol. 2. NS at 100cc/hr  3. IV PPI (continued from home PO PPI) 4. [ ]  F/u AM CBC/BMP, monitor for electrolyte/fluid derangements and to monitor creatinine  5. [ ]  f/u surgery recommendations 6. [ ]  Serial abdominal exams - if clinically worsens, re-check CBC and lactic acid and check UA 2. H/o anxiety  1. Holding home paxil while NPO 2. Will need new rx for this at discharge, per pt 3. [ ]  can consider crushing and administering per NG tube 3. COPD - continue albuterol prn 4. H/o CHF, ablation for palpitations, reported h/o MI  1. Continue home aspirin, lipitor, metoprolol 5. Hypothyroidism - Holding home sinthroid while NPO 1. F/u TSH as outpatient, which was very low in January at 0.03 6. FEN/GI: NPO; AVW:UJWJ measure; NS at 100cc/hr  1. [ ]  re-evaluate fluids with team and consider decreasing with CHF hx; currently no signs of overload 7. Prophylaxis: SCD; held off on medical VTE PPX given possible surgical procedure 8. Disposition: Management per above; discharge will be pending surgical recs and improvement in symptoms. 9. Code Status:  Full but did not address - will need to address in AM   Simone Curia MD [facility] PGY-1 Service Pager: [phone] Text pages welcome.  Can page through [email or web address], [employer] [facility]. 05/24/2013, 11:43 AM

## 2013-05-24 NOTE — H&P (Signed)
Family Medicine Teaching [facility] Admission History and Physical Service Pager: [phone]  Patient name: [name] Medical record number: [id number] Date of birth: October 10, 1956 Age: 57 y.o. Gender: female  Primary Care Provider: Janit Pagan, MD  Chief Complaint: Lambert Mody abdominal pain  Assessment and Plan: [name] is a 57 y.o. year old female with h/o opioid pain medication use and distant ectopic pregnancy surgery presenting with sharp abdominal pain. 1. Small bowel obstruction - Seen on CT abdomen/pelvis. No metabolic derangement (acidosis, alkalosis, electrolyte abnormalities) or leukocytosis seen but will need to continue to monitor. Symptomatic x 2 days, first episode. Likely due to opioid pain medication use, though states she only uses one percocet and one vicodin daily. 1. Admit to [facility], attending Dr. Lum Babe (Dr. Deirdre Priest overnight) for treatment of SBO 2. Consulted and called general surg 3. Colace and senokot, consider enema per surg recs 4. Antiemetics - phenergan and zofran 5. Pain - Tylenol PRN, morphine 1mg  q2hours prn; holding home narcotics 6. NS at 100cc/hr - re-evaluate in AM 7. Cotninue PPI 8. [ ]  Place long NG tube for decompression given persistent emesis, for comfort and to try to minimize distension 9. [ ]  Consider placement 2 large-bore peripheral IVs 10. [ ]  F/u AM CBC/BMP, monitor for electrolyte/fluid derangements and to monitor creatinine  11. [ ]  f/u surgery recommendations 2. H/o anxiety 1. Continue home paxil 2. Will need new rx for this at discharge, per pt 3. COPD - continue albuterol prn 4. H/o CHF, ablation for palpitations, reported h/o MI 1. Continue home aspirin, lipitor, metoprolol 5. Hypothyroidism - Continue home synthroid 1. F/u TSH as outpatient, which was very low in January at 0.03 6. FEN/GI: NPO pending eval by general surgery and possible surgery; JYN:WGNF measure; NS at 100cc/hr   1. [ ]  re-evaluate fluids with team and consider decreasing with CHF hx. 7. Prophylaxis: SCD; held off on medical VTE PPX given possible surgical procedure 8. Disposition: Management per above; discharge will be pending surgical evaluation and improvement in symptoms. 9. Code Status: Full but did not address - will need to address in AM   History of Present Illness: [name] is a 57 y.o. year old female with complex PMH including h/o opioid pain medication use and distant ectopic pregnancy surgery presenting with sharp abdominal pain. She states she was at a friend's sister's house eating collard greens, hamburger helper, and beans and all of a sudden had sharp pains around 7pm that would not go away. Pain is epigastric, 10/10, and has remained constant ever since. She reports her last BM was about 1 week ago, and was unable to pick up prescription for stool softener but could not afford it. Since arriving to the ED, she has been vomiting. She reports chills but denies fever, bloody stool, loose stool episodes, chest pain, shortness of breath, and uri symptoms.  Of note, she uses percocet and hydrocodone for chronic back pain, though reports only using at most 1 tablet of each daily.   In the ED, she has continued vomiting and complaining of sharp epigastric pain. She was made NPO, received pepcid 20mg , fentanyl , GI cocktail, morphine 4mg  x 2, zofran 4mg  x 1, and phenergan 25mg  x 1, along with NS bolus. She had abdominal x-ray and CT scan, which showed signs of a small bowel obstruction.   Patient Active Problem List   Diagnosis Date Noted  . SBO (small bowel obstruction) 05/24/2013  . Pain in  joint of right knee 05/01/2013  . Disease poorly controlled 04/14/2013  . Abnormal chest CT 02/24/2013  . Acute diastolic CHF (congestive heart failure) 12/02/2012  . Acute CHF 12/01/2012  . Substernal chest pain 10/05/2012  . Premature ventricular contraction 09/02/2012  .  Abnormal CT scan, bladder 08/27/2012  . Chest pain 06/12/2012  . Patellofemoral disorder of right knee 03/11/2012  . COPD (chronic obstructive pulmonary disease) 12/27/2011  . Vitamin d deficiency 12/19/2011  . Myalgia 12/18/2011  . Dental caries 11/14/2011  . Dyspnea 10/15/2011  . Chest pain 10/15/2011  . Anxiety 10/15/2011  . Tremor 09/11/2011  . Insomnia 09/11/2011  . Back pain 09/03/2011  . Arm pain 07/22/2011  . Hyperlipidemia LDL goal < 100 07/19/2011  . Hypothyroidism 07/11/2011  . History of myocardial infarction 07/05/2011  . Obesity 07/04/2011  . Palpitations 07/04/2011   Past Medical History: Past Medical History  Diagnosis Date  . Chest pain     a. s/p reported MI in 1997;  b. 09/2012 Cath: nl cors, nl LV ([place]);  c. 05/2012 Non-ischemic myoview.  . Hypertension   . Arthritis   . Problem with literacy   . Shortness of breath   . Anxiety   . Bronchitis   . Chronic headaches   . Obesity   . COPD (chronic obstructive pulmonary disease)   . Chronic diastolic CHF (congestive heart failure)     a. 12/2011 Echo: nl ef, Gr 1 DD;  b. 11/2012 Echo: EF 45-50%, no significant valvular abnormalities.  Marland Kitchen GERD (gastroesophageal reflux disease)   . Myocardial infarction 1997    self reported  . Hypothyroidism   . Asthma   . Chronic chest pain    Past Surgical History: Past Surgical History  Procedure Laterality Date  . Ectopic pregnancy surgery      right  . Nm myoview ltd  June 2013    no reversible ischemia  . Cardiac catheterization  October 2012    No coronary artery disease  . Cardiac catheterization  2012    normal coronary arteries  . Nm myoview ltd  2012, 2013    normal  . Ep study and ablation for vt  02/10/13    RVOT VT ablated by Dr Johney Frame  Ectopic surgery  Hospitalization - only for above  Allergies - penicillin  Social History: History  Substance Use Topics  . Smoking status: Former Smoker -- 1.00 packs/day for 3 years    Quit date:  12/03/2010  . Smokeless tobacco: Never Used  . Alcohol Use: No   For any additional social history documentation, please refer to relevant sections of EMR.  Family History: Family History  Problem Relation Age of Onset  . Cancer Mother 49    lung ca  . Diabetes Mother   . Anxiety disorder Sister   . Cancer Brother 60    lung   Allergies: Allergies  Allergen Reactions  . Penicillins Rash   No current facility-administered medications on file prior to encounter.   Current Outpatient Prescriptions on File Prior to Encounter  Medication Sig Dispense Refill  . albuterol (PROVENTIL HFA;VENTOLIN HFA) 108 (90 BASE) MCG/ACT inhaler Inhale 2 puffs into the lungs every 4 (four) hours as needed for wheezing or shortness of breath (cough).  1 Inhaler  1  . aspirin EC 81 MG tablet Take 81 mg by mouth daily.      Marland Kitchen atorvastatin (LIPITOR) 10 MG tablet Take 1 tablet (10 mg total) by mouth daily.  30 tablet  1  . HYDROcodone-acetaminophen (NORCO) 5-325 MG per tablet Take 1 tablet by mouth every 4 (four) hours as needed for pain.  10 tablet  0  . lansoprazole (PREVACID) 30 MG capsule Take 1 capsule (30 mg total) by mouth daily.  30 capsule  0  . levothyroxine (SYNTHROID, LEVOTHROID) 50 MCG tablet Take 1 tablet (50 mcg total) by mouth daily.  30 tablet  1  . metoprolol succinate (TOPROL-XL) 25 MG 24 hr tablet Take 1 tablet (25 mg total) by mouth daily.  30 tablet  1  . ondansetron (ZOFRAN ODT) 4 MG disintegrating tablet 4mg  ODT q4 hours prn nausea/vomit  8 tablet  0  . oxyCODONE-acetaminophen (PERCOCET/ROXICET) 5-325 MG per tablet Take 2 tablets by mouth every 4 (four) hours as needed for pain.  10 tablet  0  . PARoxetine (PAXIL) 20 MG tablet Take 1 tablet (20 mg total) by mouth every morning.  30 tablet  1  Ran out of paxil yesterday Percocet 1 tablet once dauily Vicodin once daily but not every day  Review Of Systems: Per HPI with the following additions: None Otherwise 12 point review of  systems was performed and was unremarkable.  Physical Exam: BP 115/87  Pulse 111  Temp(Src) 99.9 F (37.7 C) (Oral)  Resp 19  SpO2 97% Exam: General: Uncomfortable, but otherwise NAD, vomiting in room HEENT: AT/White, sclera clear, EOMI Neck: Supple Cardiovascular: RRR, distant, no murmurs heard Respiratory: CTAB though distant due to habitus, normal air entry, normal effort Abdomen: Obese, soft, tender epigastrically mildly, nondistended, hyperactive bowel sounds, no guarding; non-acute appearing abdomen Extremities: Atraumatic, no edema or cyanosis, obese Skin: No rash or cyanosis Neuro: Awake, alert, normal speech, no focal deficit  Labs and Imaging:  CBC    Component Value Date/Time   WBC 8.1 05/23/2013 2350   RBC 4.34 05/23/2013 2350   HGB 12.9 05/24/2013 0008   HCT 38.0 05/24/2013 0008   PLT 242 05/23/2013 2350   MCV 83.9 05/23/2013 2350   MCH 29.0 05/23/2013 2350   MCHC 34.6 05/23/2013 2350   RDW 14.0 05/23/2013 2350   LYMPHSABS 2.0 05/23/2013 2350   MONOABS 0.7 05/23/2013 2350   EOSABS 0.3 05/23/2013 2350   BASOSABS 0.1 05/23/2013 2350     BMET    Component Value Date/Time   NA 141 05/24/2013 0008   K 4.1 05/24/2013 0008   CL 105 05/24/2013 0008   CO2 22 05/21/2013 1251   GLUCOSE 101* 05/24/2013 0008   BUN 15 05/24/2013 0008   CREATININE 1.00 05/24/2013 0008   CREATININE 0.96 12/18/2011 1511   CALCIUM 8.8 05/21/2013 1251   GFRNONAA >90 05/21/2013 1251   GFRAA >90 05/21/2013 1251      Urinalysis    Component Value Date/Time   COLORURINE YELLOW 05/21/2013 1323   APPEARANCEUR CLEAR 05/21/2013 1323   LABSPEC 1.011 05/21/2013 1323   PHURINE 6.5 05/21/2013 1323   GLUCOSEU NEGATIVE 05/21/2013 1323   HGBUR NEGATIVE 05/21/2013 1323   BILIRUBINUR NEGATIVE 05/21/2013 1323   KETONESUR NEGATIVE 05/21/2013 1323   PROTEINUR NEGATIVE 05/21/2013 1323   UROBILINOGEN 0.2 05/21/2013 1323   NITRITE NEGATIVE 05/21/2013 1323   LEUKOCYTESUR TRACE* 05/21/2013 1323    Abdominal 2-view and chest 1-view: IMPRESSION:  Mild gaseous  distension of left upper quadrant small bowel with a  few air-fluid levels suggesting early or partial obstruction versus  inflammatory process. No evidence of active pulmonary disease.   CT abdomen pelvis: IMPRESSION:  Small bowel obstruction with transition zone in the  right lower  abdomen.   Simone Curia, MD 05/24/2013, 5:38 AM [facility] PGY-1 Service Pager: [phone] Text pages welcome.  Can page through [email or web address], [employer] [facility].  I have seen, examined and evaluated the patient, discussed with Dr. Velva Harman, and agree with the above. Tahani Potier 05/24/2013,7:14 AM

## 2013-05-24 NOTE — Progress Notes (Signed)
UR COMPLETED  

## 2013-05-25 DIAGNOSIS — F411 Generalized anxiety disorder: Secondary | ICD-10-CM

## 2013-05-25 DIAGNOSIS — J449 Chronic obstructive pulmonary disease, unspecified: Secondary | ICD-10-CM

## 2013-05-25 DIAGNOSIS — E785 Hyperlipidemia, unspecified: Secondary | ICD-10-CM

## 2013-05-25 DIAGNOSIS — K56609 Unspecified intestinal obstruction, unspecified as to partial versus complete obstruction: Principal | ICD-10-CM

## 2013-05-25 DIAGNOSIS — M549 Dorsalgia, unspecified: Secondary | ICD-10-CM

## 2013-05-25 DIAGNOSIS — K5900 Constipation, unspecified: Secondary | ICD-10-CM

## 2013-05-25 DIAGNOSIS — E039 Hypothyroidism, unspecified: Secondary | ICD-10-CM

## 2013-05-25 DIAGNOSIS — I252 Old myocardial infarction: Secondary | ICD-10-CM

## 2013-05-25 DIAGNOSIS — E876 Hypokalemia: Secondary | ICD-10-CM

## 2013-05-25 LAB — CBC
HCT: 35.2 % — ABNORMAL LOW (ref 36.0–46.0)
Hemoglobin: 11.4 g/dL — ABNORMAL LOW (ref 12.0–15.0)
MCV: 87.3 fL (ref 78.0–100.0)
RDW: 14.9 % (ref 11.5–15.5)
WBC: 5.7 10*3/uL (ref 4.0–10.5)

## 2013-05-25 LAB — BASIC METABOLIC PANEL
BUN: 13 mg/dL (ref 6–23)
Chloride: 106 mEq/L (ref 96–112)
Creatinine, Ser: 0.79 mg/dL (ref 0.50–1.10)
GFR calc Af Amer: >90 mL/min (ref >90)
Glucose, Bld: 102 mg/dL — ABNORMAL HIGH (ref 70–99)
Potassium: 3.4 mEq/L — ABNORMAL LOW (ref 3.5–5.1)

## 2013-05-25 MED ORDER — LEVOTHYROXINE SODIUM 50 MCG PO TABS
50.0000 ug | ORAL_TABLET | Freq: Every day | ORAL | Status: DC
  Administered 2013-05-26: 50 ug via ORAL
  Filled 2013-05-25 (×2): qty 1

## 2013-05-25 MED ORDER — HYDROCODONE-ACETAMINOPHEN 5-325 MG PO TABS
1.0000 | ORAL_TABLET | ORAL | Status: DC | PRN
  Administered 2013-05-26: 1 via ORAL
  Filled 2013-05-25: qty 2

## 2013-05-25 MED ORDER — PAROXETINE HCL 20 MG PO TABS
20.0000 mg | ORAL_TABLET | Freq: Every day | ORAL | Status: DC
  Administered 2013-05-25 – 2013-05-26 (×2): 20 mg via ORAL
  Filled 2013-05-25 (×2): qty 1

## 2013-05-25 MED ORDER — ASPIRIN EC 81 MG PO TBEC
81.0000 mg | DELAYED_RELEASE_TABLET | Freq: Every day | ORAL | Status: DC
  Administered 2013-05-26: 81 mg via ORAL
  Filled 2013-05-25: qty 1

## 2013-05-25 MED ORDER — ATORVASTATIN CALCIUM 10 MG PO TABS
10.0000 mg | ORAL_TABLET | Freq: Every day | ORAL | Status: DC
  Administered 2013-05-25: 10 mg via ORAL
  Filled 2013-05-25 (×2): qty 1

## 2013-05-25 MED ORDER — METOPROLOL SUCCINATE ER 25 MG PO TB24
25.0000 mg | ORAL_TABLET | Freq: Every day | ORAL | Status: DC
  Administered 2013-05-26: 25 mg via ORAL
  Filled 2013-05-25: qty 1

## 2013-05-25 MED ORDER — METOPROLOL TARTRATE 1 MG/ML IV SOLN
5.0000 mg | Freq: Three times a day (TID) | INTRAVENOUS | Status: AC
  Administered 2013-05-25: 5 mg via INTRAVENOUS
  Filled 2013-05-25: qty 5

## 2013-05-25 MED ORDER — ONDANSETRON 4 MG PO TBDP
4.0000 mg | ORAL_TABLET | Freq: Three times a day (TID) | ORAL | Status: DC | PRN
  Filled 2013-05-25: qty 1

## 2013-05-25 NOTE — H&P (Signed)
FMTS Attending Admission Note:  [name]  Pager: [phone]  I have seen and examined this patient, reviewed their chart. I have discussed this patient with the resident. I agree with the resident's findings, assessment and care plan.  Briefly: Patient seen and examined by me,still has mild epigastric pain,denies nausea and vomiting,she vomited yesterday,denies diarrhea or constipation.  O/E: Not in distress  Neuro: Awake and alert,oriented X 3.  Resp: Air entry equal B/L.  CV: S1 S2 normal,no murmurs.  Abdomen: Soft,mild epigastric tenderness,bowel sound normal.  Ext: No edema.  A/P: 57 Y/O female with Pmx opiate dependent,ectopic pregnancy,admitted for;  1. SBO: Agree with NPO and NGT for bowel rest and suctioning.  IV hydration while NPO.  Pain control as needed.  Appreciate surgery evaluation.  2. Chronic condition; Anxiety,COPD,CHF,Hyothyrodism: condition stable since admission.  Continue home regimen.  3. Fever: Temp elevated this morning with elevated wbc.  UA on admission looked good.  Consider repeat UA,blood culture if spike fever again and or wbc worsened.

## 2013-05-25 NOTE — Progress Notes (Signed)
FMTS Attending Admission Note: Wheeler Incorvaia,MD I  have seen and examined this patient, reviewed their chart. I have discussed this patient with the resident. I agree with the resident's findings, assessment and care plan.  

## 2013-05-25 NOTE — Progress Notes (Signed)
Subjective: Feels great today, no pain, N/V.  Ambulating well, using IS.  NPO with NG tube in.  Having flatus and had a large BM yesterday after the enema.    Objective: Vital signs in last 24 hours: Temp:  [98.1 F (36.7 C)-98.8 F (37.1 C)] 98.1 F (36.7 C) (06/10 0504) Pulse Rate:  [71-91] 71 (06/10 0504) Resp:  [16-20] 16 (06/10 0504) BP: (104-128)/(61-63) 128/63 mmHg (06/10 0504) SpO2:  [95 %-98 %] 98 % (06/10 0504) Last BM Date: 05/25/13  Intake/Output from previous day: 06/09 0701 - 06/10 0700 In: 706.7 [I.V.:706.7] Out: 900 [Emesis/NG output:900] Intake/Output this shift:    PE: Gen:  Alert, NAD, pleasant Abd: Soft, NT/ND, +BS, no HSM  Lab Results:   Recent Labs  05/24/13 0815 05/25/13 0650  WBC 10.8* 5.7  HGB 13.6 11.4*  HCT 41.2 35.2*  PLT 259 225   BMET  Recent Labs  05/24/13 0815 05/25/13 0650  NA 139 139  K 4.8 3.4*  CL 104 106  CO2 24 27  GLUCOSE 155* 102*  BUN 16 13  CREATININE 0.86 0.79  CALCIUM 9.2 8.1*   PT/INR No results found for this basename: LABPROT, INR,  in the last 72 hours CMP     Component Value Date/Time   NA 139 05/25/2013 0650   K 3.4* 05/25/2013 0650   CL 106 05/25/2013 0650   CO2 27 05/25/2013 0650   GLUCOSE 102* 05/25/2013 0650   BUN 13 05/25/2013 0650   CREATININE 0.79 05/25/2013 0650   CREATININE 0.96 12/18/2011 1511   CALCIUM 8.1* 05/25/2013 0650   PROT 7.1 05/23/2013 2350   ALBUMIN 4.0 05/23/2013 2350   AST 21 05/23/2013 2350   ALT 15 05/23/2013 2350   ALKPHOS 90 05/23/2013 2350   BILITOT 0.3 05/23/2013 2350   GFRNONAA >90 05/25/2013 0650   GFRAA >90 05/25/2013 0650   Lipase     Component Value Date/Time   LIPASE 30 05/23/2013 2350       Studies/Results: Ct Abdomen Pelvis W Contrast  05/24/2013   *RADIOLOGY REPORT*  Clinical Data: Sharp epigastric pain.  Recent diagnosis of gastroesophageal reflux disease.  CT ABDOMEN AND PELVIS WITH CONTRAST  Technique:  Multidetector CT imaging of the abdomen and pelvis was  performed following the standard protocol during bolus administration of intravenous contrast.  Contrast: OMNIPAQUE IOHEXOL 300 MG/ML  SOLN  Comparison: 08/20/2012  Findings: The lung bases are clear.  The liver, spleen, gallbladder, pancreas, adrenal glands, kidneys, abdominal aorta, and retroperitoneal lymph nodes are unremarkable. The inferior vena cava is somewhat flattened which may be seen with dehydration or hypovolemia.  Proximal small bowel dilatation with predominately fluid-filled loops.  Distal small bowel loops are decompressed.  Changes are consistent with small bowel obstruction. Transition zone appears to be in the right lower quadrant.  No specific mass or obstructing lesion is appreciated in changes may be due to adhesions.  No free air or free fluid in the abdomen.  Pelvis:  Bladder wall is not thickened.  Uterus and adnexal structures are not enlarged.  Appendix is not identified but no inflammatory changes are suggested in the right lower quadrant.  No evidence of diverticulitis.  No free or loculated pelvic fluid collections.  No significant pelvic lymphadenopathy.  Degenerative changes in the spine.  No destructive bone lesions appreciated.  IMPRESSION: Small bowel obstruction with transition zone in the right lower abdomen.   Original Report Authenticated By: Burman Nieves, M.D.   Dg Abd Acute W/chest  05/24/2013   *RADIOLOGY REPORT*  Clinical Data: Severe epigastric pain.  Constipation.  ACUTE ABDOMEN SERIES (ABDOMEN 2 VIEW & CHEST 1 VIEW)  Comparison: Chest 05/21/2013.  Abdomen 07/06/2012.  Findings: Shallow inspiration.  Heart size and pulmonary vascularity are normal for technique.  No focal airspace disease or consolidation in the lungs.  No blunting of costophrenic angles. No pneumothorax.  Mediastinal contours appear intact.  No significant change since previous chest radiograph.  There are mildly distended gas-filled small bowel loops in the left upper quadrant with a few  air-fluid levels noted on the upright view.  There is a paucity of gas and stool in the colon.  Changes may represent early obstruction versus inflammatory process.  No free intra-abdominal air.  No radiopaque stones.  Visualized bones appear intact.  IMPRESSION: Mild gaseous distension of left upper quadrant small bowel with a few air-fluid levels suggesting early or partial obstruction versus inflammatory process.  No evidence of active pulmonary disease.   Original Report Authenticated By: Burman Nieves, M.D.    Anti-infectives: Anti-infectives   None       Assessment/Plan pSBO - Pt has only had ectopic surgery, but she also is on chronic narcotic use with significant constipation - likely not true obstruction given big BM yesterday and quick resolution of symptoms 1. Start clamping trials, hopefully d/c NG tube today after trials, allow clear sips during trial 2. Ambulation and IS  3. SCD's and heparin 4. SMOG enema with good result, continue dulcolax  today. No mass apparent on CT. Pt has not had colonoscopy - will need one as an outpatient 5. Bowel function returning, not anticipating surgery at this time  Constipation from narcotics Hypokalemia - mild   LOS: 2 days    Aris Georgia 05/25/2013, 8:17 AM Pager: [phone]

## 2013-05-25 NOTE — Progress Notes (Signed)
I have seen and examined the patient and agree with the assessment and plans.  Shloime Keilman A. Krisa Blattner  MD, FACS  

## 2013-05-25 NOTE — Progress Notes (Signed)
FMTS Daily Intern Progress Note  Subjective: Pt seen at bedside. Feeling much better this morning. Abd pain down to 1/10 (was 10/10 at presentation). Pt is having flatus. Pt had a large BM last night after enema. Currently feels hungry.  I have reviewed the patient's medications.  Objective Temp:  [98.1 F (36.7 C)-98.8 F (37.1 C)] 98.1 F (36.7 C) (06/10 0504) Pulse Rate:  [71-91] 71 (06/10 0504) Resp:  [16-20] 16 (06/10 0504) BP: (104-128)/(61-63) 128/63 mmHg (06/10 0504) SpO2:  [95 %-98 %] 98 % (06/10 0504)  NG tube output 6/9: ~900 mL  Exam: General: adult female lying in bed, in NAD, NG in place to clamp Pulm: CTAB, normal effort/WOB Abd: Soft, nontender to palpation, BS soft but present; obese Ext: atraumatic, no cyanosis or edema, obese   Labs and Imaging  Recent Labs Lab 05/23/13 2350 05/24/13 0008 05/24/13 0815 05/25/13 0650  WBC 8.1  --  10.8* 5.7  HGB 12.6 12.9 13.6 11.4*  HCT 36.4 38.0 41.2 35.2*  PLT 242  --  259 225     Recent Labs Lab 05/21/13 1251 05/24/13 0008 05/24/13 0815 05/25/13 0650  NA 138 141 139 139  K 3.8 4.1 4.8 3.4*  CL 105 105 104 106  CO2 22  --  24 27  BUN 10 15 16 13   CREATININE 0.76 1.00 0.86 0.79  GLUCOSE 88 101* 155* 102*  CALCIUM 8.8  --  9.2 8.1*   Assessment and Plan: CRYSTALEE VENTRESS is a 57 y.o. year old female with h/o opioid pain medication use and distant ectopic pregnancy surgery presenting with sharp abdominal pain. CT abd/pelvis confirms likely SBO. Labs without significant derangement. Improving s/p bowl rest and NG tube decompression.  #Small bowel obstruction - Believe secondary to due to opioid pain medication use vs adhesions from distant h/o ectopic surgery. 6/9 at 6 AM pt developed fever to 100.33F and P 130 but has no further leukocytosis or fever.  -General surgery consulted, appreciate assistance  [ ]  NG tube clamped today, possible removal this afternoon  [ ]  diet - ice chips/sips with meds, then to  clears once NG removed  [ ]  IVF until taking clears (reduce today; hx of CHF with no evidence for frank fluid overload)  [ ]  Serial abdominal exams - if clinically worsens, re-check CBC and lactic acid and check UA  #H/o anxiety - held home Paxil while NPO, restarted 6/10 [ ] Will need new rx for this at discharge, per pt  #COPD - continue albuterol PRN  #H/o CHF, ablation for palpitations, reported h/o MI  -continue home aspirin, Lipitor, metoprolol with holds/IV meds as above  #Hypothyroidism - Holding home sinthroid while NPO [ ]  F/u TSH as outpatient, which was very low in January at 0.03  #FEN/GI: NPO; AVW:UJWJ measure; NS at 100cc/hr   [ ]  re-evaluate fluids with team and consider decreasing with CHF hx; currently no signs of overload #Prophylaxis: SCD; did not use pharmaceutical prophylaxis for DVT given possible surgical procedure #Disposition: Management per above; discharge pending further surgical recs and improvement in symptoms #Code Status: Full Code  Holly Morton, MD Family Practice PGY-1 Service Pager: [phone] Text pages welcome.  Can page through [email or web address], [employer] [facility]. 05/25/2013, 9:33 AM

## 2013-05-26 LAB — GLUCOSE, CAPILLARY

## 2013-05-26 MED ORDER — DOCUSATE SODIUM 100 MG PO CAPS: 100.0000 mg | ORAL_CAPSULE | Freq: Two times a day (BID) | ORAL | Status: DC

## 2013-05-26 MED ORDER — ONDANSETRON 4 MG PO TBDP: ORAL_TABLET | ORAL | Status: DC

## 2013-05-26 MED ORDER — PAROXETINE HCL 20 MG PO TABS: 20.0000 mg | ORAL_TABLET | ORAL | Status: DC

## 2013-05-26 MED ORDER — ALBUTEROL SULFATE HFA 108 (90 BASE) MCG/ACT IN AERS: 2.0000 | INHALATION_SPRAY | RESPIRATORY_TRACT | Status: DC | PRN

## 2013-05-26 NOTE — Progress Notes (Signed)
FMTS Attending Admission Note: Nicolette Bang Pager; [phone] I  have seen and examined this patient, reviewed their chart. I have discussed this patient with the resident. I agree with the resident's findings, assessment and care plan.  Patient much better today,NGT out,she is tolerating soft dit, bowel sound normal,NT,ND.We will advance to full diet and see if she will tolerate it,if doing fine will d/c home.

## 2013-05-26 NOTE — Progress Notes (Signed)
Discharge home, home discharge instruction given, no questions verbalized. 

## 2013-05-26 NOTE — Progress Notes (Signed)
  Subjective: Pt was laying comfortably in bed and is without pain. Had BM yesterday with enema and flatus the past 3 days. She has been able to ambulate around the unit without problems and has tolerated clear liquid diet well. Denies any nausea or vomiting. She looks forward to returning home.   Objective: Vital signs in last 24 hours: Temp:  [98.2 F (36.8 C)-98.4 F (36.9 C)] 98.4 F (36.9 C) (06/11 0539) Pulse Rate:  [60-72] 60 (06/11 0539) Resp:  [20] 20 (06/11 0539) BP: (112-134)/(50-91) 112/50 mmHg (06/11 0539) SpO2:  [97 %-98 %] 97 % (06/11 0539) Weight:  [233 lb 7.5 oz (105.9 kg)-236 lb 8 oz (107.276 kg)] 236 lb 8 oz (107.276 kg) (06/11 0750) Last BM Date: 05/25/13  Intake/Output from previous day: 06/10 0701 - 06/11 0700 In: 3 [I.V.:3] Out: 75 [Emesis/NG output:75] Intake/Output this shift:    PE: Gen:  Alert, NAD, pleasant Abd: Soft, NT/ND, +BS, no HSM, no abdominal scars noted  Lab Results:   Recent Labs  05/24/13 0815 05/25/13 0650  WBC 10.8* 5.7  HGB 13.6 11.4*  HCT 41.2 35.2*  PLT 259 225   BMET  Recent Labs  05/24/13 0815 05/25/13 0650  NA 139 139  K 4.8 3.4*  CL 104 106  CO2 24 27  GLUCOSE 155* 102*  BUN 16 13  CREATININE 0.86 0.79  CALCIUM 9.2 8.1*   PT/INR No results found for this basename: LABPROT, INR,  in the last 72 hours CMP     Component Value Date/Time   NA 139 05/25/2013 0650   K 3.4* 05/25/2013 0650   CL 106 05/25/2013 0650   CO2 27 05/25/2013 0650   GLUCOSE 102* 05/25/2013 0650   BUN 13 05/25/2013 0650   CREATININE 0.79 05/25/2013 0650   CREATININE 0.96 12/18/2011 1511   CALCIUM 8.1* 05/25/2013 0650   PROT 7.1 05/23/2013 2350   ALBUMIN 4.0 05/23/2013 2350   AST 21 05/23/2013 2350   ALT 15 05/23/2013 2350   ALKPHOS 90 05/23/2013 2350   BILITOT 0.3 05/23/2013 2350   GFRNONAA >90 05/25/2013 0650   GFRAA >90 05/25/2013 0650   Lipase     Component Value Date/Time   LIPASE 30 05/23/2013 2350       Studies/Results: No results  found.  Anti-infectives: Anti-infectives   None       Assessment/Plan Possible partial SBO, most likely constipation secondary to narcotics 1. Advance to heart healthy solid diet  2. Ambulation and deep breathing 3. Encouraged minimal use of narcotics  -Would recommend stool softeners if on narcotics 4. Bowel function returned, no surgery recommended at this time.   -Pt ok to discharge today.   -No follow up needed with surgery, will sign off, pt to call with any questions or concerns.   Constipation from Narcotics Hypokalemia- mild, per primary service    LOS: 3 days    Cephus Shelling, PA-S  05/26/2013, 8:56 AM

## 2013-05-26 NOTE — Discharge Summary (Signed)
Family Medicine Teaching [facility] Discharge Summary  Patient name: [name] Medical record number: [id number] Date of birth: 1956-11-22 Age: 56 y.o. Gender: female Date of Admission: 05/23/2013  Date of Discharge: 05/26/2013 Admitting Physician: Carney Living, MD  Primary Care Provider: Janit Pagan, MD  Indication for Hospitalization: abdominal pain, severe N/V, constipation Discharge Diagnoses:  Small bowel obstruction COPD HLD Hx of myocardial infarction, CHF Hx of heart ablation for symptomatic palpitations Anxiety Obesity Chronic back pain Hypothyroidism  Consultations: general surgery (Dr. Magnus Ivan)  Significant Labs and Imaging:   Recent Labs Lab 05/23/13 2350 05/24/13 0008 05/24/13 0815 05/25/13 0650  WBC 8.1  --  10.8* 5.7  HGB 12.6 12.9 13.6 11.4*  HCT 36.4 38.0 41.2 35.2*  PLT 242  --  259 225    Recent Labs Lab 05/21/13 1251 05/23/13 2350 05/24/13 0008 05/24/13 0815 05/25/13 0650  NA 138  --  141 139 139  K 3.8  --  4.1 4.8 3.4*  CL 105  --  105 104 106  CO2 22  --   --  24 27  GLUCOSE 88  --  101* 155* 102*  BUN 10  --  15 16 13   CREATININE 0.76  --  1.00 0.86 0.79  CALCIUM 8.8  --   --  9.2 8.1*  ALKPHOS 87 90  --   --   --   AST 19 21  --   --   --   ALT 12 15  --   --   --   ALBUMIN 3.9 4.0  --   --   --    Lactic acid, 6/9 (admission): 2.0 (WNR)  EKG 6/9: NSR, no significant change from previous  Abdominal film w/CXR 6/9 @0052  Findings: Shallow inspiration. Heart size and pulmonary  vascularity are normal for technique. No focal airspace disease or  consolidation in the lungs. No blunting of costophrenic angles.  No pneumothorax. Mediastinal contours appear intact. No  significant change since previous chest radiograph.  There are mildly distended gas-filled small bowel loops in the left  upper quadrant with a few air-fluid levels noted on the upright  view. There is a paucity of gas and stool in the colon.  Changes  may represent early obstruction versus inflammatory process. No  free intra-abdominal air. No radiopaque stones. Visualized bones  appear intact.  IMPRESSION:  Mild gaseous distension of left upper quadrant small bowel with a  few air-fluid levels suggesting early or partial obstruction versus  inflammatory process. No evidence of active pulmonary disease.  CT Abd/Pelvis 6/9 @0257  Findings: The lung bases are clear.  The liver, spleen, gallbladder, pancreas, adrenal glands, kidneys,  abdominal aorta, and retroperitoneal lymph nodes are unremarkable.  The inferior vena cava is somewhat flattened which may be seen with  dehydration or hypovolemia. Proximal small bowel dilatation with  predominately fluid-filled loops. Distal small bowel loops are  decompressed. Changes are consistent with small bowel obstruction.  Transition zone appears to be in the right lower quadrant. No  specific mass or obstructing lesion is appreciated in changes may  be due to adhesions. No free air or free fluid in the abdomen.  Pelvis: Bladder wall is not thickened. Uterus and adnexal  structures are not enlarged. Appendix is not identified but no  inflammatory changes are suggested in the right lower quadrant. No  evidence of diverticulitis. No free or loculated pelvic fluid  collections. No significant pelvic lymphadenopathy. Degenerative  changes in the spine. No destructive  bone lesions appreciated.  IMPRESSION:  Small bowel obstruction with transition zone in the right lower  abdomen.  Procedures: NG tube placement 6/9, removed 6/10  Brief Hospital Course: [name] is a 57 y.o. year old female who presented with sharp abdominal pain (10/10, epigastric in nature, with last BM prior to admission ~7 days prior to presentation to the ED) as well as severe nausea and vomiting; PMH significant for chronic opioid use for back pain and hx of ectopic pregnancy surgery (laparoscopic) in the remote  past. CT abd/pelvis confirmed likely SBO. Labs without significant derangement other than very mild leukocytosis on admission that resolved without specific treatment. Pt had improvement in her symptoms to her baseline state of health s/p bowl rest, SMOG enema, and NG tube decompression. At time of discharge, pt was having normal BM's and tolerating a normal diet well, with minimal abdominal pain and nausea, controlled with PO medications. See below by problem list.   #Small bowel obstruction - possibly secondary to due to constipation from opioid pain medication use vs adhesions from distant h/o ectopic surgery. General surgery was consulted on admission due to concern of possible need for surgical management (and thus pt was NOT given pharmaceutical DVT prophylaxis, with SCD's used instead); pt was initially kept NPO with IV pain medication, IV anti-emetics, and IV substitutions for home PO medications. Of note, on 6/9 at 6 AM pt developed fever to 100.54F and P 130 but had no further leukocytosis or fever. Pt had significant improvement 6/10-6/11 with relief of pain after placement of NG tube for decompression and large BM after SMOG enema, both per surgery recommendations. Pt was discharged with Rx for Colace to be used indefinitely with her chronic pain medication and recommendation to avoid spicy, heavy/fatty meals for at least 2-3 days.  #H/o anxiety - held home Paxil while NPO, restarted 6/10 without incident or development of withdrawal symptoms -new Rx/refill was provided at time of discharge  #COPD - pt was ordered for albuterol PRN but required no treatments -of note, pt does not report any home controller medication for COPD -new Rx/refill for albuterol inhaler was provided at time of discharge  #H/o CHF, ablation for palpitations, reported h/o MI  -pt was continued on home aspirin, Lipitor, metoprolol -ASA was given by suppository, Lipitor was held, and metoprolol was dosed IV while pt was  NPO for SBO as above -fluid status was monitored closely given hx of CHF, while pt was on IVF for NPO; pt had no evidence for frank overload   #Hypothyroidism - helding home Synthroid while NPO, then resumed PO without incident  Discharge Medications:    Medication List    TAKE these medications       albuterol 108 (90 BASE) MCG/ACT inhaler  Commonly known as:  PROVENTIL HFA;VENTOLIN HFA  Inhale 2 puffs into the lungs every 4 (four) hours as needed for wheezing or shortness of breath (cough).     aspirin EC 81 MG tablet  Take 81 mg by mouth daily.     atorvastatin 10 MG tablet  Commonly known as:  LIPITOR  Take 1 tablet (10 mg total) by mouth daily.     docusate sodium 100 MG capsule  Commonly known as:  COLACE  Take 1 capsule (100 mg total) by mouth 2 (two) times daily. DO NOT TAKE if your bowel movements are loose.     HYDROcodone-acetaminophen 5-325 MG per tablet  Commonly known as:  NORCO  Take 1 tablet by  mouth every 4 (four) hours as needed for pain.     lansoprazole 30 MG capsule  Commonly known as:  PREVACID  Take 1 capsule (30 mg total) by mouth daily.     levothyroxine 50 MCG tablet  Commonly known as:  SYNTHROID, LEVOTHROID  Take 1 tablet (50 mcg total) by mouth daily.     metoprolol succinate 25 MG 24 hr tablet  Commonly known as:  TOPROL-XL  Take 1 tablet (25 mg total) by mouth daily.     ondansetron 4 MG disintegrating tablet  Commonly known as:  ZOFRAN ODT  4mg  ODT every 4 hours as needed nausea/vomit     oxyCODONE-acetaminophen 5-325 MG per tablet  Commonly known as:  PERCOCET/ROXICET  Take 2 tablets by mouth every 4 (four) hours as needed for pain.     PARoxetine 20 MG tablet  Commonly known as:  PAXIL  Take 1 tablet (20 mg total) by mouth every morning.       Disposition: discharge home  Issues for Follow Up: 1. SBO - Please evaluate for any further abdominal pain/nausea/vomiting/etc, as well as any issues with constipation. SBO believed to  be secondary to narcotic use by surgery consultant, as above; pt was discharged on home medication regimen with the addition of Colace.  2. Pt would likely benefit from screening colonoscopy (age >50 without prior screen). Could refer to GI on an outpt basis.  3. Consider recheck of TSH once acute illness is completely resolved (of note, pt had a very low TSH in January 2014 of 0.03).  Outstanding Results: none  Discharge Instructions: Please refer to Patient Instructions section of EMR for full details.  Patient was counseled important signs and symptoms that should prompt return to medical care, changes in medications, dietary instructions, activity restrictions, and follow up appointments.   Follow-up Information   Follow up with Levert Feinstein, MD On 05/31/2013. (Appointment time at 2:00 PM)    Contact information:   [address] [place] [address] [phone]      Discharge Condition: stable  [address] Amillia Biffle, [place], MD 05/26/2013, 8:42 PM

## 2013-05-26 NOTE — Progress Notes (Signed)
FMTS Daily Intern Progress Note  Subjective: Pt seen at bedside. Continues to feel well. Currently without abdominal pain. Tolerated clear liquids well this morning for breakfast. Chronic back pain well controlled.  I have reviewed the patient's medications.  Objective Temp:  [98.2 F (36.8 C)-98.4 F (36.9 C)] 98.4 F (36.9 C) (06/11 0539) Pulse Rate:  [60-72] 60 (06/11 0539) Resp:  [20] 20 (06/11 0539) BP: (112-134)/(50-91) 112/50 mmHg (06/11 0539) SpO2:  [97 %-98 %] 97 % (06/11 0539) Weight:  [233 lb 7.5 oz (105.9 kg)-236 lb 8 oz (107.276 kg)] 236 lb 8 oz (107.276 kg) (06/11 0750)  NG tube output 6/9: ~900 mL NG residual <100 6/10 after clamping trial --> tube pulled 6/10  Exam: General: adult female lying in bed, in NAD, alert/oriented Cardio: RRR, trace bilateral LE edema, at baseline per pt Pulm: CTAB, normal effort/WOB Abd: Soft, nontender to palpation, BS normoactive; obese Ext: atraumatic, no cyanosis, warm/well-perfused without redness/tenderness, obese  Labs and Imaging  Recent Labs Lab 05/23/13 2350 05/24/13 0008 05/24/13 0815 05/25/13 0650  WBC 8.1  --  10.8* 5.7  HGB 12.6 12.9 13.6 11.4*  HCT 36.4 38.0 41.2 35.2*  PLT 242  --  259 225    Recent Labs Lab 05/21/13 1251 05/24/13 0008 05/24/13 0815 05/25/13 0650  NA 138 141 139 139  K 3.8 4.1 4.8 3.4*  CL 105 105 104 106  CO2 22  --  24 27  BUN 10 15 16 13   CREATININE 0.76 1.00 0.86 0.79  GLUCOSE 88 101* 155* 102*  CALCIUM 8.8  --  9.2 8.1*   Assessment and Plan: [name] is a 57 y.o. year old female with h/o opioid pain medication use and distant ectopic pregnancy surgery presenting with sharp abdominal pain. CT abd/pelvis confirms likely SBO. Labs without significant derangement. Improving s/p bowl rest and NG tube decompression.  #Small bowel obstruction - Believe secondary to due to opioid pain medication use vs adhesions from distant h/o ectopic surgery. 6/9 at 6 AM pt developed fever  to 100.23F and P 130 but has no further leukocytosis or fever. Generally much improved 6/11.   -General surgery consulted, appreciate assistance  -NG tube clamped/removed 6/10 with PO medications restarted  -tolerating clears well  [ ]  advance as tolerated to heart healthy diet, saline lock IVF  [ ]  continue serial abdominal exams  #H/o anxiety - held home Paxil while NPO, restarted 6/10 [ ]  Will need new rx for this at discharge, per pt  #COPD - continue albuterol PRN  #H/o CHF, ablation for palpitations, reported h/o MI  -continue home aspirin, Lipitor, metoprolol  -monitoring fluid status closely given hx of CHF and since pt was on IVF while NPO; no evidence for frank overload  #Hypothyroidism - Holding home Synthroid while NPO [ ]  F/u TSH as outpatient once acute illness resolved (TSH very low in January at 0.03)  #FEN/GI: diet/fluids as above #Prophylaxis: SCD's   -did not use pharmaceutical prophylaxis for DVT given possibility of requiring surgical management #Disposition: management as above  -possible discharge home today #Code Status: Full Code  Bobbye Morton, MD Family Practice PGY-1 Service Pager: [phone]  Text pages welcome through [email or web address] (logon: [email or web address]) 05/26/2013, 8:46 AM

## 2013-05-26 NOTE — Discharge Summary (Signed)
FMTS Attending Admission Note: Kehinde Eniola,MD Pager [phone] I  have seen and examined this patient, reviewed their chart. I have discussed this patient with the resident. I agree with the resident's findings, assessment and care plan.  

## 2013-05-26 NOTE — Progress Notes (Signed)
I have seen and examined the patient and agree with the assessment and plans. Will sign off  Averee Harb A. Aramis Weil  MD, FACS 

## 2013-05-31 ENCOUNTER — Ambulatory Visit: Payer: Medicaid Other | Admitting: Family Medicine

## 2013-06-06 ENCOUNTER — Encounter (HOSPITAL_COMMUNITY): Payer: Self-pay | Admitting: Adult Health

## 2013-06-06 ENCOUNTER — Emergency Department (HOSPITAL_COMMUNITY)
Admission: EM | Admit: 2013-06-06 | Discharge: 2013-06-07 | Disposition: A | Discharge status: Home / Self Care | Payer: Medicaid Other | Attending: Emergency Medicine | Admitting: Emergency Medicine

## 2013-06-06 ENCOUNTER — Emergency Department (HOSPITAL_COMMUNITY): Payer: Medicaid Other

## 2013-06-06 DIAGNOSIS — Z7982 Long term (current) use of aspirin: Secondary | ICD-10-CM | POA: Insufficient documentation

## 2013-06-06 DIAGNOSIS — M129 Arthropathy, unspecified: Secondary | ICD-10-CM | POA: Insufficient documentation

## 2013-06-06 DIAGNOSIS — E039 Hypothyroidism, unspecified: Secondary | ICD-10-CM | POA: Insufficient documentation

## 2013-06-06 DIAGNOSIS — R0789 Other chest pain: Secondary | ICD-10-CM

## 2013-06-06 DIAGNOSIS — I5032 Chronic diastolic (congestive) heart failure: Secondary | ICD-10-CM | POA: Insufficient documentation

## 2013-06-06 DIAGNOSIS — Z87891 Personal history of nicotine dependence: Secondary | ICD-10-CM | POA: Insufficient documentation

## 2013-06-06 DIAGNOSIS — J45909 Unspecified asthma, uncomplicated: Secondary | ICD-10-CM | POA: Insufficient documentation

## 2013-06-06 DIAGNOSIS — I10 Essential (primary) hypertension: Secondary | ICD-10-CM | POA: Insufficient documentation

## 2013-06-06 DIAGNOSIS — Z880 Allergy status to penicillin: Secondary | ICD-10-CM | POA: Insufficient documentation

## 2013-06-06 DIAGNOSIS — W010XXA Fall on same level from slipping, tripping and stumbling without subsequent striking against object, initial encounter: Secondary | ICD-10-CM | POA: Insufficient documentation

## 2013-06-06 DIAGNOSIS — F411 Generalized anxiety disorder: Secondary | ICD-10-CM | POA: Insufficient documentation

## 2013-06-06 DIAGNOSIS — Z8679 Personal history of other diseases of the circulatory system: Secondary | ICD-10-CM | POA: Insufficient documentation

## 2013-06-06 DIAGNOSIS — Y9229 Other specified public building as the place of occurrence of the external cause: Secondary | ICD-10-CM | POA: Insufficient documentation

## 2013-06-06 DIAGNOSIS — I252 Old myocardial infarction: Secondary | ICD-10-CM | POA: Insufficient documentation

## 2013-06-06 DIAGNOSIS — K219 Gastro-esophageal reflux disease without esophagitis: Secondary | ICD-10-CM | POA: Insufficient documentation

## 2013-06-06 DIAGNOSIS — M25569 Pain in unspecified knee: Secondary | ICD-10-CM | POA: Insufficient documentation

## 2013-06-06 DIAGNOSIS — E669 Obesity, unspecified: Secondary | ICD-10-CM | POA: Insufficient documentation

## 2013-06-06 DIAGNOSIS — Y998 Other external cause status: Secondary | ICD-10-CM | POA: Insufficient documentation

## 2013-06-06 DIAGNOSIS — R071 Chest pain on breathing: Secondary | ICD-10-CM | POA: Insufficient documentation

## 2013-06-06 DIAGNOSIS — Z79899 Other long term (current) drug therapy: Secondary | ICD-10-CM | POA: Insufficient documentation

## 2013-06-06 LAB — CBC
HCT: 39.1 % (ref 36.0–46.0)
Hemoglobin: 12.8 g/dL (ref 12.0–15.0)
MCHC: 32.7 g/dL (ref 30.0–36.0)
MCV: 86.3 fL (ref 78.0–100.0)

## 2013-06-06 LAB — BASIC METABOLIC PANEL
BUN: 10 mg/dL (ref 6–23)
Creatinine, Ser: 0.79 mg/dL (ref 0.50–1.10)
GFR calc non Af Amer: >90 mL/min (ref >90)
Glucose, Bld: 85 mg/dL (ref 70–99)
Potassium: 3.6 mEq/L (ref 3.5–5.1)

## 2013-06-06 NOTE — ED Notes (Signed)
Presents with right knee pain and right shoulder/chest pain. The right shoulder pain and chest pain began at 18:50, the right knee pain began after a fall at church this am where pt tripped and fell on her right knee and heard a "pop" right knee swollen. Right chest pain is described as sharp and non radiating. Denies SOB, denies nausea.

## 2013-06-07 ENCOUNTER — Ambulatory Visit: Payer: Medicaid Other | Admitting: Internal Medicine

## 2013-06-07 MED ORDER — METHOCARBAMOL 750 MG PO TABS: 750.0000 mg | ORAL_TABLET | Freq: Four times a day (QID) | ORAL | Status: DC

## 2013-06-07 MED ORDER — IBUPROFEN 600 MG PO TABS: 600.0000 mg | ORAL_TABLET | Freq: Four times a day (QID) | ORAL | Status: DC | PRN

## 2013-06-07 NOTE — ED Provider Notes (Signed)
History     CSN: [id number]  Arrival date & time 06/06/13  1901   First MD Initiated Contact with Patient 06/06/13 2355      Chief Complaint  Patient presents with  . Chest Pain    (Consider location/radiation/quality/duration/timing/severity/associated sxs/prior treatment) Patient is a 57 y.o. female presenting with chest pain. The history is provided by the patient.  Chest Pain  patient here complaining of constant right-sided chest pain that started today. She has a history of chronic chest pain and this is no different. Denies anginal type symptoms. No recent fever or cough. Pain characterized as sharp and worse with movement. No leg pain or swelling. She did trip today and injured her right knee. She denies this once the knee. Denies any associated hip or ankle pain. No treatment used prior to arrival.  Past Medical History  Diagnosis Date  . Chest pain     a. s/p reported MI in 1997;  b. 09/2012 Cath: nl cors, nl LV ([place]);  c. 05/2012 Non-ischemic myoview.  . Hypertension   . Arthritis   . Problem with literacy   . Shortness of breath   . Anxiety   . Bronchitis   . Chronic headaches   . Obesity   . COPD (chronic obstructive pulmonary disease)   . Chronic diastolic CHF (congestive heart failure)     a. 12/2011 Echo: nl ef, Gr 1 DD;  b. 11/2012 Echo: EF 45-50%, no significant valvular abnormalities.  Marland Kitchen GERD (gastroesophageal reflux disease)   . Myocardial infarction 1997    self reported  . Hypothyroidism   . Asthma   . Chronic chest pain     Past Surgical History  Procedure Laterality Date  . Ectopic pregnancy surgery      right  . Nm myoview ltd  June 2013    no reversible ischemia  . Cardiac catheterization  October 2012    No coronary artery disease  . Cardiac catheterization  2012    normal coronary arteries  . Nm myoview ltd  2012, 2013    normal  . Ep study and ablation for vt  02/10/13    RVOT VT ablated by Dr Johney Frame    Family History  Problem  Relation Age of Onset  . Cancer Mother 8    lung ca  . Diabetes Mother   . Anxiety disorder Sister   . Cancer Brother 60    lung    History  Substance Use Topics  . Smoking status: Former Smoker -- 1.00 packs/day for 3 years    Quit date: 12/03/2010  . Smokeless tobacco: Never Used  . Alcohol Use: No    OB History   Grav Para Term Preterm Abortions TAB SAB Ect Mult Living                  Review of Systems  Cardiovascular: Positive for chest pain.  All other systems reviewed and are negative.    Allergies  Penicillins  Home Medications   Current Outpatient Rx  Name  Route  Sig  Dispense  Refill  . albuterol (PROVENTIL HFA;VENTOLIN HFA) 108 (90 BASE) MCG/ACT inhaler   Inhalation   Inhale 2 puffs into the lungs every 4 (four) hours as needed for wheezing or shortness of breath (cough).   1 Inhaler   1   . aspirin EC 81 MG tablet   Oral   Take 81 mg by mouth daily.         Marland Kitchen atorvastatin (  LIPITOR) 10 MG tablet   Oral   Take 1 tablet (10 mg total) by mouth daily.   30 tablet   1   . docusate sodium (COLACE) 100 MG capsule   Oral   Take 1 capsule (100 mg total) by mouth 2 (two) times daily. DO NOT TAKE if your bowel movements are loose.   60 capsule   0   . lansoprazole (PREVACID) 30 MG capsule   Oral   Take 1 capsule (30 mg total) by mouth daily.   30 capsule   0   . levothyroxine (SYNTHROID, LEVOTHROID) 50 MCG tablet   Oral   Take 1 tablet (50 mcg total) by mouth daily.   30 tablet   1   . metoprolol succinate (TOPROL-XL) 25 MG 24 hr tablet   Oral   Take 1 tablet (25 mg total) by mouth daily.   30 tablet   1   . PARoxetine (PAXIL) 20 MG tablet   Oral   Take 20 mg by mouth daily.           BP 150/88  Pulse 85  Temp(Src) 98.1 F (36.7 C) (Oral)  Resp 16  SpO2 92%  Physical Exam  Nursing note and vitals reviewed. Constitutional: She is oriented to person, place, and time. She appears well-developed and well-nourished.   Non-toxic appearance. No distress.  HENT:  Head: Normocephalic and atraumatic.  Eyes: Conjunctivae, EOM and lids are normal. Pupils are equal, round, and reactive to light.  Neck: Normal range of motion. Neck supple. No tracheal deviation present. No mass present.  Cardiovascular: Normal rate, regular rhythm and normal heart sounds.  Exam reveals no gallop.   No murmur heard. Pulmonary/Chest: Effort normal and breath sounds normal. No stridor. No respiratory distress. She has no decreased breath sounds. She has no wheezes. She has no rhonchi. She has no rales.    Abdominal: Soft. Normal appearance and bowel sounds are normal. She exhibits no distension. There is no tenderness. There is no rebound and no CVA tenderness.  Musculoskeletal: Normal range of motion. She exhibits no edema and no tenderness.       Legs: Neurological: She is alert and oriented to person, place, and time. She has normal strength. No cranial nerve deficit or sensory deficit. GCS eye subscore is 4. GCS verbal subscore is 5. GCS motor subscore is 6.  Skin: Skin is warm and dry. No abrasion and no rash noted.  Psychiatric: She has a normal mood and affect. Her speech is normal and behavior is normal.    ED Course  Procedures (including critical care time)  Labs Reviewed  CBC  BASIC METABOLIC PANEL  POCT I-STAT TROPONIN I   Dg Chest 2 View  06/06/2013   *RADIOLOGY REPORT*  Clinical Data: Chest pain and shortness of breath.  CHEST - 2 VIEW  Comparison: 06/09 and 05/21/2013  Findings: Heart size and pulmonary vascularity are normal.  No infiltrates or effusions. Chronic peribronchial thickening.  No osseous abnormality.  IMPRESSION: Chronic bronchitic changes.  No acute abnormalities.   Original Report Authenticated By: Francene Boyers, M.D.   Dg Knee Complete 4 Views Right  06/06/2013   *RADIOLOGY REPORT*  Clinical Data: Pain  RIGHT KNEE - COMPLETE 4+ VIEW  Comparison: 04/28/2013  Findings: Normal alignment and no  fracture.  Mild joint space narrowing medially without significant spurring.  Small joint effusion.  IMPRESSION: Mild degenerative change and effusion.   Original Report Authenticated By: Janeece Riggers, M.D.  No diagnosis found.    MDM   Date: 06/07/2013  Rate: 90  Rhythm: normal sinus rhythm  QRS Axis: normal  Intervals: normal  ST/T Wave abnormalities: normal  Conduction Disutrbances:none  Narrative Interpretation:   Old EKG Reviewed: none available  Pt without concern for acs or pe--right knee sprain based on hx and exam          Toy Baker, MD 06/07/13 0017

## 2013-06-10 ENCOUNTER — Emergency Department (HOSPITAL_COMMUNITY)
Admission: EM | Admit: 2013-06-10 | Discharge: 2013-06-10 | Disposition: A | Discharge status: Home / Self Care | Payer: Medicaid Other | Attending: Emergency Medicine | Admitting: Emergency Medicine

## 2013-06-10 ENCOUNTER — Emergency Department (HOSPITAL_COMMUNITY): Payer: Medicaid Other

## 2013-06-10 ENCOUNTER — Encounter (HOSPITAL_COMMUNITY): Payer: Self-pay | Admitting: *Deleted

## 2013-06-10 DIAGNOSIS — F411 Generalized anxiety disorder: Secondary | ICD-10-CM | POA: Insufficient documentation

## 2013-06-10 DIAGNOSIS — M549 Dorsalgia, unspecified: Secondary | ICD-10-CM

## 2013-06-10 DIAGNOSIS — I5032 Chronic diastolic (congestive) heart failure: Secondary | ICD-10-CM | POA: Insufficient documentation

## 2013-06-10 DIAGNOSIS — R109 Unspecified abdominal pain: Secondary | ICD-10-CM

## 2013-06-10 DIAGNOSIS — I252 Old myocardial infarction: Secondary | ICD-10-CM | POA: Insufficient documentation

## 2013-06-10 DIAGNOSIS — E669 Obesity, unspecified: Secondary | ICD-10-CM | POA: Insufficient documentation

## 2013-06-10 DIAGNOSIS — J449 Chronic obstructive pulmonary disease, unspecified: Secondary | ICD-10-CM | POA: Insufficient documentation

## 2013-06-10 DIAGNOSIS — M545 Low back pain, unspecified: Secondary | ICD-10-CM | POA: Insufficient documentation

## 2013-06-10 DIAGNOSIS — Z79899 Other long term (current) drug therapy: Secondary | ICD-10-CM | POA: Insufficient documentation

## 2013-06-10 DIAGNOSIS — J45909 Unspecified asthma, uncomplicated: Secondary | ICD-10-CM | POA: Insufficient documentation

## 2013-06-10 DIAGNOSIS — K219 Gastro-esophageal reflux disease without esophagitis: Secondary | ICD-10-CM | POA: Insufficient documentation

## 2013-06-10 DIAGNOSIS — Z8709 Personal history of other diseases of the respiratory system: Secondary | ICD-10-CM | POA: Insufficient documentation

## 2013-06-10 DIAGNOSIS — J4489 Other specified chronic obstructive pulmonary disease: Secondary | ICD-10-CM | POA: Insufficient documentation

## 2013-06-10 DIAGNOSIS — Z7982 Long term (current) use of aspirin: Secondary | ICD-10-CM | POA: Insufficient documentation

## 2013-06-10 DIAGNOSIS — Z87891 Personal history of nicotine dependence: Secondary | ICD-10-CM | POA: Insufficient documentation

## 2013-06-10 DIAGNOSIS — I10 Essential (primary) hypertension: Secondary | ICD-10-CM | POA: Insufficient documentation

## 2013-06-10 DIAGNOSIS — Z8739 Personal history of other diseases of the musculoskeletal system and connective tissue: Secondary | ICD-10-CM | POA: Insufficient documentation

## 2013-06-10 DIAGNOSIS — Z8679 Personal history of other diseases of the circulatory system: Secondary | ICD-10-CM | POA: Insufficient documentation

## 2013-06-10 DIAGNOSIS — R1032 Left lower quadrant pain: Secondary | ICD-10-CM | POA: Insufficient documentation

## 2013-06-10 DIAGNOSIS — E039 Hypothyroidism, unspecified: Secondary | ICD-10-CM | POA: Insufficient documentation

## 2013-06-10 LAB — URINALYSIS, ROUTINE W REFLEX MICROSCOPIC
Bilirubin Urine: NEGATIVE
Nitrite: NEGATIVE
Protein, ur: NEGATIVE mg/dL
Specific Gravity, Urine: 1.022 (ref 1.005–1.030)
Urobilinogen, UA: 1 mg/dL (ref 0.0–1.0)

## 2013-06-10 LAB — COMPREHENSIVE METABOLIC PANEL
ALT: 13 U/L (ref 0–35)
CO2: 24 mEq/L (ref 19–32)
Calcium: 9 mg/dL (ref 8.4–10.5)
Creatinine, Ser: 0.83 mg/dL (ref 0.50–1.10)
GFR calc Af Amer: 90 mL/min — ABNORMAL LOW (ref >90)
GFR calc non Af Amer: 77 mL/min — ABNORMAL LOW (ref >90)
Glucose, Bld: 76 mg/dL (ref 70–99)

## 2013-06-10 LAB — CBC WITH DIFFERENTIAL/PLATELET
Eosinophils Relative: 3 % (ref 0–5)
HCT: 38.2 % (ref 36.0–46.0)
Lymphocytes Relative: 24 % (ref 12–46)
Lymphs Abs: 2.3 10*3/uL (ref 0.7–4.0)
MCV: 84.7 fL (ref 78.0–100.0)
Monocytes Absolute: 0.8 10*3/uL (ref 0.1–1.0)
RBC: 4.51 MIL/uL (ref 3.87–5.11)
RDW: 14 % (ref 11.5–15.5)
WBC: 9.5 10*3/uL (ref 4.0–10.5)

## 2013-06-10 LAB — URINE MICROSCOPIC-ADD ON

## 2013-06-10 MED ORDER — HYDROCODONE-ACETAMINOPHEN 5-325 MG PO TABS: 2.0000 | ORAL_TABLET | ORAL | Status: DC | PRN

## 2013-06-10 MED ORDER — IOHEXOL 300 MG/ML  SOLN: 25.0000 mL | INTRAMUSCULAR | Status: AC

## 2013-06-10 MED ORDER — IOHEXOL 300 MG/ML  SOLN
100.0000 mL | Freq: Once | INTRAMUSCULAR | Status: AC | PRN
  Administered 2013-06-10: 100 mL via INTRAVENOUS

## 2013-06-10 MED ORDER — SODIUM CHLORIDE 0.9 % IV SOLN
INTRAVENOUS | Status: DC
  Administered 2013-06-10: 21:00:00 via INTRAVENOUS

## 2013-06-10 NOTE — ED Notes (Signed)
MD at bedside. 

## 2013-06-10 NOTE — ED Provider Notes (Signed)
History    CSN: [id number] Arrival date & time 06/10/13  1801  First MD Initiated Contact with Patient 06/10/13 2026     Chief Complaint  Patient presents with  . Groin Pain  . Back Pain   (Consider location/radiation/quality/duration/timing/severity/associated sxs/prior Treatment) HPI Comments: Patient comes to the ER for evaluation of left lower abdominal pain. Patient reports that she had onset of a sharp pain in the left lower abdomen this morning that lasted for a short period of time and then resolved. She has had multiple recurrences, now continuous pain in the left lower abdomen radiating into the back. Patient denies fever, nausea, vomiting and diarrhea associated with the symptoms. She has not noticed any urinary symptoms.  Patient is a 57 y.o. female presenting with groin pain and back pain.  Groin Pain Associated symptoms include abdominal pain.  Back Pain Associated symptoms: abdominal pain    Past Medical History  Diagnosis Date  . Chest pain     a. s/p reported MI in 1997;  b. 09/2012 Cath: nl cors, nl LV ([place]);  c. 05/2012 Non-ischemic myoview.  . Hypertension   . Arthritis   . Problem with literacy   . Shortness of breath   . Anxiety   . Bronchitis   . Chronic headaches   . Obesity   . COPD (chronic obstructive pulmonary disease)   . Chronic diastolic CHF (congestive heart failure)     a. 12/2011 Echo: nl ef, Gr 1 DD;  b. 11/2012 Echo: EF 45-50%, no significant valvular abnormalities.  Marland Kitchen GERD (gastroesophageal reflux disease)   . Myocardial infarction 1997    self reported  . Hypothyroidism   . Asthma   . Chronic chest pain    Past Surgical History  Procedure Laterality Date  . Ectopic pregnancy surgery      right  . Nm myoview ltd  June 2013    no reversible ischemia  . Cardiac catheterization  October 2012    No coronary artery disease  . Cardiac catheterization  2012    normal coronary arteries  . Nm myoview ltd  2012, 2013    normal  .  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 Cancer Mother 22    lung ca  . Diabetes Mother   . Anxiety disorder Sister   . Cancer Brother 60    lung   History  Substance Use Topics  . Smoking status: Former Smoker -- 1.00 packs/day for 3 years    Quit date: 12/03/2010  . Smokeless tobacco: Never Used  . Alcohol Use: No   OB History   Grav Para Term Preterm Abortions TAB SAB Ect Mult Living                 Review of Systems  Gastrointestinal: Positive for abdominal pain.  Genitourinary: Positive for flank pain.  Musculoskeletal: Positive for back pain.  All other systems reviewed and are negative.    Allergies  Penicillins  Home Medications   Current Outpatient Rx  Name  Route  Sig  Dispense  Refill  . albuterol (PROVENTIL HFA;VENTOLIN HFA) 108 (90 BASE) MCG/ACT inhaler   Inhalation   Inhale 2 puffs into the lungs every 4 (four) hours as needed for wheezing or shortness of breath (cough).   1 Inhaler   1   . aspirin EC 81 MG tablet  Oral   Take 81 mg by mouth daily.         Marland Kitchen atorvastatin (LIPITOR) 10 MG tablet   Oral   Take 1 tablet (10 mg total) by mouth daily.   30 tablet   1   . docusate sodium (COLACE) 100 MG capsule   Oral   Take 1 capsule (100 mg total) by mouth 2 (two) times daily. DO NOT TAKE if your bowel movements are loose.   60 capsule   0   . ibuprofen (ADVIL,MOTRIN) 600 MG tablet   Oral   Take 1 tablet (600 mg total) by mouth every 6 (six) hours as needed for pain.   30 tablet   0   . lansoprazole (PREVACID) 30 MG capsule   Oral   Take 1 capsule (30 mg total) by mouth daily.   30 capsule   0   . levothyroxine (SYNTHROID, LEVOTHROID) 50 MCG tablet   Oral   Take 1 tablet (50 mcg total) by mouth daily.   30 tablet   1   . methocarbamol (ROBAXIN-750) 750 MG tablet   Oral   Take 1 tablet (750 mg total) by mouth 4 (four) times daily.   30 tablet    0   . metoprolol succinate (TOPROL-XL) 25 MG 24 hr tablet   Oral   Take 1 tablet (25 mg total) by mouth daily.   30 tablet   1   . PARoxetine (PAXIL) 20 MG tablet   Oral   Take 20 mg by mouth daily.          BP 121/77  Pulse 81  Temp(Src) 98.2 F (36.8 C) (Oral)  Resp 16  SpO2 100% Physical Exam  Constitutional: She is oriented to person, place, and time. She appears well-developed and well-nourished. No distress.  HENT:  Head: Normocephalic and atraumatic.  Right Ear: Hearing normal.  Left Ear: Hearing normal.  Nose: Nose normal.  Mouth/Throat: Oropharynx is clear and moist and mucous membranes are normal.  Eyes: Conjunctivae and EOM are normal. Pupils are equal, round, and reactive to light.  Neck: Normal range of motion. Neck supple.  Cardiovascular: Regular rhythm, S1 normal and S2 normal.  Exam reveals no gallop and no friction rub.   No murmur heard. Pulmonary/Chest: Effort normal and breath sounds normal. No respiratory distress. She exhibits no tenderness.  Abdominal: Soft. Normal appearance and bowel sounds are normal. There is no hepatosplenomegaly. There is tenderness in the left lower quadrant. There is no rebound, no guarding, no tenderness at McBurney's point and negative Murphy's sign. No hernia.  Musculoskeletal: Normal range of motion.  Neurological: She is alert and oriented to person, place, and time. She has normal strength. No cranial nerve deficit or sensory deficit. Coordination normal. GCS eye subscore is 4. GCS verbal subscore is 5. GCS motor subscore is 6.  Skin: Skin is warm, dry and intact. No rash noted. No cyanosis.  Psychiatric: She has a normal mood and affect. Her speech is normal and behavior is normal. Thought content normal.    ED Course  Procedures (including critical care time) Labs Reviewed  COMPREHENSIVE METABOLIC PANEL - Abnormal; Notable for the following:    GFR calc non Af Amer 77 (*)    GFR calc Af Amer 90 (*)    All other  components within normal limits  URINALYSIS, ROUTINE W REFLEX MICROSCOPIC - Abnormal; Notable for the following:    APPearance HAZY (*)    Leukocytes, UA MODERATE (*)  All other components within normal limits  URINE MICROSCOPIC-ADD ON - Abnormal; Notable for the following:    Bacteria, UA FEW (*)    All other components within normal limits  URINE CULTURE  CBC WITH DIFFERENTIAL   Ct Abdomen Pelvis W Contrast  06/10/2013   *RADIOLOGY REPORT*  Clinical Data: Left lower quadrant pain, flank pain.  Evaluate for stones or diverticulitis.  CT ABDOMEN AND PELVIS WITH CONTRAST  Technique:  Multidetector CT imaging of the abdomen and pelvis was performed following the standard protocol during bolus administration of intravenous contrast.  Contrast: OMNIPAQUE IOHEXOL 300 MG/ML  SOLN  Comparison: 05/24/2013  Findings: Lung bases are clear.  No effusions.  Heart is normal size.  Liver, gallbladder, spleen, pancreas, adrenals and kidneys are normal.  No renal or ureteral stones.  No hydronephrosis.  Uterus, adnexa and urinary bladder are unremarkable.  Normal appendix. Bowel grossly unremarkable.  No free fluid, free air, or adenopathy.  Negative for diverticulosis or diverticulitis. Aorta is normal caliber.  No acute bony abnormality.  IMPRESSION: No acute findings in the abdomen or pelvis.   Original Report Authenticated By: Charlett Nose, M.D.   Diagnosis: 1. Back pain 2. Abdominal pain  MDM  Patient presents to ER for evaluation of pain in the left back and groin area. Pain comes and goes. She has not had any urinary symptoms, examination was unremarkable. Vital signs are normal. CAT scan does not show any acute abnormality. Patient will be discharged on analgesia as needed.  Gilda Crease, MD 06/10/13 2256

## 2013-06-10 NOTE — ED Notes (Signed)
CT called; pt finished drinking CT contrast

## 2013-06-10 NOTE — ED Notes (Signed)
Reports intermittent episodes of left groin pain today, lasts a few mins each episode but now radiates through to her back. Denies any urinary or vaginal symptoms. Denies n/v/d.

## 2013-06-12 LAB — URINE CULTURE: Colony Count: 45000

## 2013-06-15 ENCOUNTER — Encounter: Payer: Self-pay | Admitting: Family Medicine

## 2013-06-15 ENCOUNTER — Ambulatory Visit (INDEPENDENT_AMBULATORY_CARE_PROVIDER_SITE_OTHER): Payer: Medicaid Other | Admitting: Family Medicine

## 2013-06-15 VITALS — BP 123/79 | HR 87 | Temp 98.4°F | Wt 224.0 lb

## 2013-06-15 DIAGNOSIS — Z1211 Encounter for screening for malignant neoplasm of colon: Secondary | ICD-10-CM | POA: Insufficient documentation

## 2013-06-15 DIAGNOSIS — E039 Hypothyroidism, unspecified: Secondary | ICD-10-CM

## 2013-06-15 DIAGNOSIS — Z09 Encounter for follow-up examination after completed treatment for conditions other than malignant neoplasm: Secondary | ICD-10-CM | POA: Insufficient documentation

## 2013-06-15 DIAGNOSIS — R05 Cough: Secondary | ICD-10-CM

## 2013-06-15 DIAGNOSIS — R059 Cough, unspecified: Secondary | ICD-10-CM | POA: Insufficient documentation

## 2013-06-15 MED ORDER — BENZONATATE 100 MG PO CAPS: 100.0000 mg | ORAL_CAPSULE | Freq: Two times a day (BID) | ORAL | Status: DC | PRN

## 2013-06-15 NOTE — Patient Instructions (Signed)
Thank you for following up with me [name], [name] am however sorry that you are coughing,I will like you to try tessalon prn cough,if cough persist please follow up soon.  Cough, Adult  A cough is a reflex that helps clear your throat and airways. It can help heal the body or may be a reaction to an irritated airway. A cough may only last 2 or 3 weeks (acute) or may last more than 8 weeks (chronic).  CAUSES Acute cough:  Viral or bacterial infections. Chronic cough:  Infections.  Allergies.  Asthma.  Post-nasal drip.  Smoking.  Heartburn or acid reflux.  Some medicines.  Chronic lung problems (COPD).  Cancer. SYMPTOMS   Cough.  Fever.  Chest pain.  Increased breathing rate.  High-pitched whistling sound when breathing (wheezing).  Colored mucus that you cough up (sputum). TREATMENT   A bacterial cough may be treated with antibiotic medicine.  A viral cough must run its course and will not respond to antibiotics.  Your caregiver may recommend other treatments if you have a chronic cough. HOME CARE INSTRUCTIONS   Only take over-the-counter or prescription medicines for pain, discomfort, or fever as directed by your caregiver. Use cough suppressants only as directed by your caregiver.  Use a cold steam vaporizer or humidifier in your bedroom or home to help loosen secretions.  Sleep in a semi-upright position if your cough is worse at night.  Rest as needed.  Stop smoking if you smoke. SEEK IMMEDIATE MEDICAL CARE IF:   You have pus in your sputum.  Your cough starts to worsen.  You cannot control your cough with suppressants and are losing sleep.  You begin coughing up blood.  You have difficulty breathing.  You develop pain which is getting worse or is uncontrolled with medicine.  You have a fever. MAKE SURE YOU:   Understand these instructions.  Will watch your condition.  Will get help right away if you are not doing well or get  worse. Document Released: 05/31/2011 Document Revised: 02/24/2012 Document Reviewed: 05/31/2011 [facility] Patient Information 2014 [place], [place].

## 2013-06-15 NOTE — Assessment & Plan Note (Addendum)
TSH checked today. Continue Synthroid for now pending TSH result.

## 2013-06-15 NOTE — Assessment & Plan Note (Signed)
GI referral done for colonoscopy.

## 2013-06-15 NOTE — Assessment & Plan Note (Signed)
Feels better after discharge,back pain and stomach pain resolved.

## 2013-06-15 NOTE — Assessment & Plan Note (Signed)
Pulmonary exam benign. Possibly URI. Tessalon prescribed prn cough. Patient instructed to return soon if not feeling better.

## 2013-06-15 NOTE — Progress Notes (Addendum)
Subjective:     Patient ID: [name], female   DOB: 1956-10-21, 57 y.o.   MRN: [id number]  HPI Colon Cancer screening:here for follow up after admission few weeks ago for SBO,she is completely asymptomatic now,no GI symptoms,she is yet to have colonoscopy for colon cancer screening,she needs this to be scheduled. Hospital f/U: Back pain and left LL abdominal pain,both has improved,she still has some back pain well controlled on Norco but she is now out of it,she does have Tramadol at home which she used prn. She denies any N/V,no change in bowel habit,feels better. Hypothyroidism:She is compliant with synthroid 50 mcg qd but out of her medication for the last 2wks,she denies heat or cold intolerance,feels well in general. Cough:C/O cough for the last 6 days worse at night,she described her cough as being dry with no sputum production,she denies chest pain,no chest tightness,feels wheezy at times due to her COPD.   Current Outpatient Prescriptions on File Prior to Visit  Medication Sig Dispense Refill  . albuterol (PROVENTIL HFA;VENTOLIN HFA) 108 (90 BASE) MCG/ACT inhaler Inhale 2 puffs into the lungs every 4 (four) hours as needed for wheezing or shortness of breath (cough).  1 Inhaler  1  . aspirin EC 81 MG tablet Take 81 mg by mouth daily.      Marland Kitchen atorvastatin (LIPITOR) 10 MG tablet Take 1 tablet (10 mg total) by mouth daily.  30 tablet  1  . docusate sodium (COLACE) 100 MG capsule Take 1 capsule (100 mg total) by mouth 2 (two) times daily. DO NOT TAKE if your bowel movements are loose.  60 capsule  0  . lansoprazole (PREVACID) 30 MG capsule Take 1 capsule (30 mg total) by mouth daily.  30 capsule  0  . levothyroxine (SYNTHROID, LEVOTHROID) 50 MCG tablet Take 1 tablet (50 mcg total) by mouth daily.  30 tablet  1  . metoprolol succinate (TOPROL-XL) 25 MG 24 hr tablet Take 1 tablet (25 mg total) by mouth daily.  30 tablet  1  . PARoxetine (PAXIL) 20 MG tablet Take 20 mg by mouth daily.       Marland Kitchen ibuprofen (ADVIL,MOTRIN) 600 MG tablet Take 1 tablet (600 mg total) by mouth every 6 (six) hours as needed for pain.  30 tablet  0   No current facility-administered medications on file prior to visit.   Past Medical History  Diagnosis Date  . Chest pain     a. s/p reported MI in 1997;  b. 09/2012 Cath: nl cors, nl LV ([place]);  c. 05/2012 Non-ischemic myoview.  . Hypertension   . Arthritis   . Problem with literacy   . Shortness of breath   . Anxiety   . Bronchitis   . Chronic headaches   . Obesity   . COPD (chronic obstructive pulmonary disease)   . Chronic diastolic CHF (congestive heart failure)     a. 12/2011 Echo: nl ef, Gr 1 DD;  b. 11/2012 Echo: EF 45-50%, no significant valvular abnormalities.  Marland Kitchen GERD (gastroesophageal reflux disease)   . Myocardial infarction 1997    self reported  . Hypothyroidism   . Asthma   . Chronic chest pain       Review of Systems  Respiratory: Negative.   Cardiovascular: Negative.   Gastrointestinal: Negative.   All other systems reviewed and are negative.   Filed Vitals:   06/15/13 1126  BP: 123/79  Pulse: 87  Temp: 98.4 F (36.9 C)  TempSrc: Oral  Weight: 224  lb (101.606 kg)       Objective:   Physical Exam  Nursing note and vitals reviewed. Constitutional: She is oriented to person, place, and time. She appears well-developed. No distress.  Cardiovascular: Normal rate, regular rhythm, normal heart sounds and intact distal pulses.   No murmur heard. Pulmonary/Chest: Effort normal and breath sounds normal. No respiratory distress. She has no wheezes.  Abdominal: Soft. Bowel sounds are normal. She exhibits no distension and no mass. There is no tenderness. There is no rebound and no guarding.  Musculoskeletal: Normal range of motion. She exhibits no edema.  Neurological: She is alert and oriented to person, place, and time.       Assessment/Plan:     Check problem list

## 2013-06-16 ENCOUNTER — Telehealth: Payer: Self-pay | Admitting: *Deleted

## 2013-06-16 ENCOUNTER — Encounter: Payer: Self-pay | Admitting: Internal Medicine

## 2013-06-16 ENCOUNTER — Other Ambulatory Visit: Payer: Self-pay | Admitting: Family Medicine

## 2013-06-16 MED ORDER — LEVOTHYROXINE SODIUM 50 MCG PO TABS: 50.0000 ug | ORAL_TABLET | Freq: Every day | ORAL | Status: DC

## 2013-06-16 NOTE — Telephone Encounter (Signed)
Message copied by Osborne Oman on Wed Jun 16, 2013  8:56 AM ------      Message from: Janit Pagan T      Created: Wed Jun 16, 2013  8:04 AM       I called to discuss TSH report with patient which is elevated and worse than it was,I left a message for her to call back to discuss test result and medication adjustment. ------

## 2013-06-17 ENCOUNTER — Ambulatory Visit (INDEPENDENT_AMBULATORY_CARE_PROVIDER_SITE_OTHER): Payer: Medicaid Other | Admitting: Internal Medicine

## 2013-06-17 ENCOUNTER — Encounter: Payer: Self-pay | Admitting: Internal Medicine

## 2013-06-17 VITALS — BP 102/72 | HR 90 | Ht 68.0 in | Wt 225.6 lb

## 2013-06-17 DIAGNOSIS — R42 Dizziness and giddiness: Secondary | ICD-10-CM

## 2013-06-17 DIAGNOSIS — R05 Cough: Secondary | ICD-10-CM

## 2013-06-17 DIAGNOSIS — I493 Ventricular premature depolarization: Secondary | ICD-10-CM

## 2013-06-17 DIAGNOSIS — I4949 Other premature depolarization: Secondary | ICD-10-CM

## 2013-06-17 DIAGNOSIS — R059 Cough, unspecified: Secondary | ICD-10-CM

## 2013-06-17 NOTE — Patient Instructions (Addendum)
Your physician wants you to follow-up in: 6 months with Dr Haywood Filler will receive a reminder letter in the mail two months in advance. If you don't receive a letter, please call our office to schedule the follow-up appointment.  See Dr Johney Frame as needed  Your physician has requested that you have an echocardiogram. Echocardiography is a painless test that uses sound waves to create images of your heart. It provides your doctor with information about the size and shape of your heart and how well your heart's chambers and valves are working. This procedure takes approximately one hour. There are no restrictions for this procedure.----if EF better will stop Metoprolol

## 2013-06-17 NOTE — Progress Notes (Signed)
PCP:  Janit Pagan, MD Primary Cardiologist:  Dr Eden Emms  The patient presents today for routine electrophysiology followup.  Since last being seen in our clinic, the patient reports doing very well. Her symptomatic PVCs have resolved.  With this, her exercise tolerance is much improved.  She has recently had a nonproductive cough for which she is followed by primary care.  She has occasional sharp chest wall pain with foreceful cough.   Today, she denies symptoms of palpitations, exertional chest pain, shortness of breath, orthopnea, PND, lower extremity edema, presyncope, syncope, or neurologic sequela.  She has occasional mild postural dizziness.  The patient feels that she is tolerating medications without difficulties and is otherwise without complaint today.   Past Medical History  Diagnosis Date  . Chest pain     a. s/p reported MI in 1997;  b. 09/2012 Cath: nl cors, nl LV ([place]);  c. 05/2012 Non-ischemic myoview.  . Hypertension   . Arthritis   . Problem with literacy   . Shortness of breath   . Anxiety   . Bronchitis   . Chronic headaches   . Obesity   . COPD (chronic obstructive pulmonary disease)   . Chronic diastolic CHF (congestive heart failure)     a. 12/2011 Echo: nl ef, Gr 1 DD;  b. 11/2012 Echo: EF 45-50%, no significant valvular abnormalities.  Marland Kitchen GERD (gastroesophageal reflux disease)   . Myocardial infarction 1997    self reported  . Hypothyroidism   . Asthma   . Chronic chest pain    Past Surgical History  Procedure Laterality Date  . Ectopic pregnancy surgery      right  . Nm myoview ltd  June 2013    no reversible ischemia  . Cardiac catheterization  October 2012    No coronary artery disease  . Cardiac catheterization  2012    normal coronary arteries  . Nm myoview ltd  2012, 2013    normal  . Ep study and ablation for vt  02/10/13    RVOT VT ablated by Dr Johney Frame    Current Outpatient Prescriptions  Medication Sig Dispense Refill  .  albuterol (PROVENTIL HFA;VENTOLIN HFA) 108 (90 BASE) MCG/ACT inhaler Inhale 2 puffs into the lungs every 4 (four) hours as needed for wheezing or shortness of breath (cough).  1 Inhaler  1  . aspirin EC 81 MG tablet Take 81 mg by mouth daily.      Marland Kitchen atorvastatin (LIPITOR) 10 MG tablet Take 1 tablet (10 mg total) by mouth daily.  30 tablet  1  . benzonatate (TESSALON) 100 MG capsule Take 1 capsule (100 mg total) by mouth 2 (two) times daily as needed for cough.  20 capsule  0  . docusate sodium (COLACE) 100 MG capsule Take 1 capsule (100 mg total) by mouth 2 (two) times daily. DO NOT TAKE if your bowel movements are loose.  60 capsule  0  . ibuprofen (ADVIL,MOTRIN) 600 MG tablet Take 1 tablet (600 mg total) by mouth every 6 (six) hours as needed for pain.  30 tablet  0  . lansoprazole (PREVACID) 30 MG capsule Take 1 capsule (30 mg total) by mouth daily.  30 capsule  0  . levothyroxine (SYNTHROID, LEVOTHROID) 50 MCG tablet Take 1 tablet (50 mcg total) by mouth daily.  30 tablet  2  . metoprolol succinate (TOPROL-XL) 25 MG 24 hr tablet Take 1 tablet (25 mg total) by mouth daily.  30 tablet  1  .  PARoxetine (PAXIL) 20 MG tablet Take 20 mg by mouth daily.       No current facility-administered medications for this visit.    Allergies  Allergen Reactions  . Penicillins Rash    History   Social History  . Marital Status: Legally Separated    Spouse Name: N/A    Number of Children: N/A  . Years of Education: N/A   Occupational History  . Not on file.   Social History Main Topics  . Smoking status: Former Smoker -- 1.00 packs/day for 3 years    Quit date: 12/03/2010  . Smokeless tobacco: Never Used  . Alcohol Use: No  . Drug Use: No  . Sexually Active: No   Other Topics Concern  . Not on file   Social History Narrative   Lives with a female friend, just roommates, separated.  Has 4 kids, all grown.  Finished 10th grade.  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  Lives in  [place]    Family History  Problem Relation Age of Onset  . Cancer Mother 65    lung ca  . Diabetes Mother   . Anxiety disorder Sister   . Cancer Brother 60    lung    Physical Exam: Filed Vitals:   06/17/13 1102 06/17/13 1103 06/17/13 1105 06/17/13 1106  BP: 98/64 100/70 104/70 102/72  Pulse: 79 89 90 90  Height:      Weight:      SpO2: 99% 98% 96% 98%    GEN- The patient is well appearing, alert and oriented x 3 today.   Head- normocephalic, atraumatic Eyes-  Sclera clear, conjunctiva pink Ears- hearing intact Oropharynx- clear Neck- supple, no JVP Lymph- no cervical lymphadenopathy Lungs- Clear to ausculation bilaterally, normal work of breathing Heart- Regular rate and rhythm, no murmurs, rubs or gallops, PMI not laterally displaced GI- soft, NT, ND, + BS Extremities- no clubbing, cyanosis, or edema MS- no significant deformity or atrophy Skin- no rash or lesion Psych- euthymic mood, full affect Neuro- strength and sensation are intact  Holter monitor 3/14 is reviewed and reveals sinus rhythm with very rare PVCs and appropriate suppression of PVCs/ NSVT with ablation  Assessment and Plan:  1. PVCs/ NSVT Resolved s/p ablation  2. Nonischemic CM Likely due to high burden of PVCs Repeat echo  If EF has normalized, stop toprol IF EF remains depressed then she will follow up with Dr Eden Emms for medical management  3. Postural dizziness- mild Adequate hydration Stop toprol as above if EF is recovered  No further EP workup or intervention planned I will see as needed going forward   She should followup as planned with Dr Eden Emms in 6 months

## 2013-06-19 ENCOUNTER — Telehealth: Payer: Self-pay | Admitting: Family Medicine

## 2013-06-19 NOTE — Telephone Encounter (Signed)
Patient called the after hours line today:  Pt complains of small red area on her right leg crease that started burning yesterday. She does not recall trauma or bite to the area. By description is small, non-raised without vesicles. No fever associated. Tender to touch, becoming more tender. Suggested use of tylenol for pain and antihistamine, in the event it is allergic in nature or insect bite. Advised patient if fever develops, or worsening symptoms then she should be seen at urgent care.  Maythe Deramo, DO

## 2013-06-22 ENCOUNTER — Telehealth: Payer: Self-pay | Admitting: Family Medicine

## 2013-06-22 ENCOUNTER — Telehealth: Payer: Self-pay | Admitting: *Deleted

## 2013-06-22 NOTE — Telephone Encounter (Signed)
Patient sees Dr. Johney Frame and will need echocardiogram and 3 follow-up visits.  Due to patient having Medicaid, office requesting NPI # to authorize appts.  NPI # given for visits.  Gaylene Brooks, RN

## 2013-06-22 NOTE — Telephone Encounter (Signed)
Called pt back, she just wanted to know if she had any other labs besides the TSH.  Advised that she did not and reiterated message from Dr. Lum Babe about her TSH. [place], Maryjo Rochester

## 2013-06-22 NOTE — Telephone Encounter (Signed)
Pt returning call. JW

## 2013-06-22 NOTE — Telephone Encounter (Signed)
Thanks

## 2013-07-12 ENCOUNTER — Other Ambulatory Visit (HOSPITAL_COMMUNITY): Payer: Medicaid Other

## 2013-07-12 IMAGING — CT CT ABD-PELV W/ CM
2 of 5 series · 17 of 46 positions shown, 19 images · IV contrast (APPLIED)
Comparison: 05/24/2013

CLINICAL DATA: Left lower quadrant pain, flank pain.  Evaluate for
stones or diverticulitis.

CT ABDOMEN AND PELVIS WITH CONTRAST
TECHNIQUE: Multidetector CT imaging of the abdomen and pelvis was
performed following the standard protocol during bolus
administration of intravenous contrast.
Contrast: 100mL OMNIPAQUE IOHEXOL 300 MG/ML  SOLN

[Series 2: abd/pelv with 5.0 b31f st · axial · 0.90mm/px · z∈[-464,-14]mm · 14 of 102 slices shown, 16 images]
[im 6/102  soft-tissue]
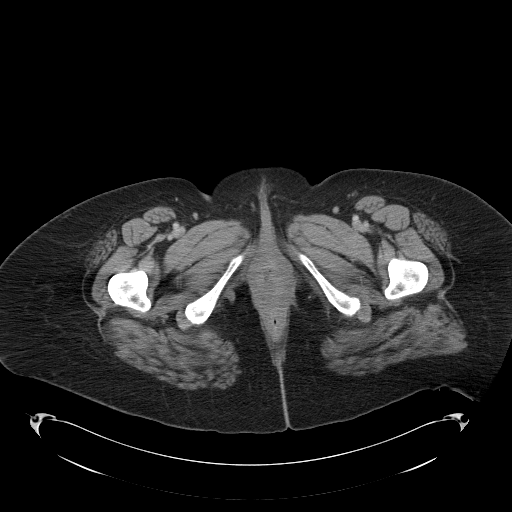
[im 6/102  bone]
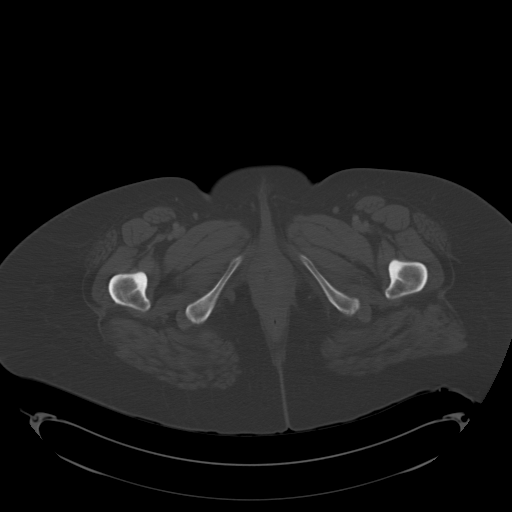
[im 11/102  soft-tissue]
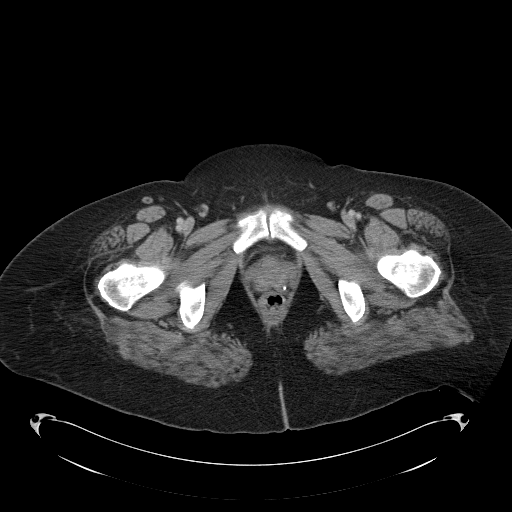
[im 22/102  soft-tissue]
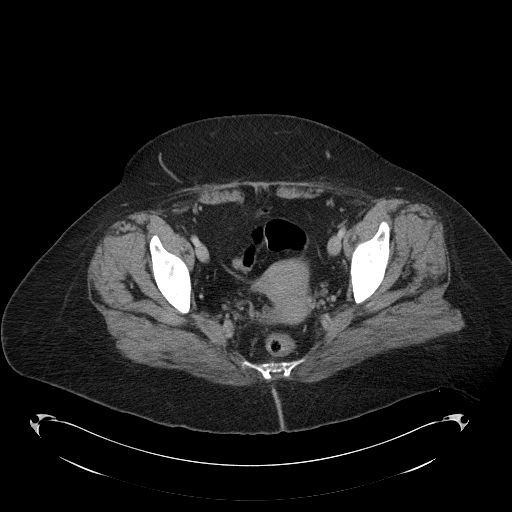
[im 27/102  soft-tissue]
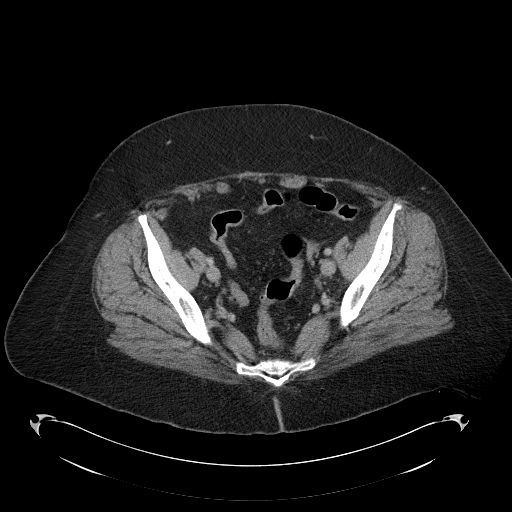
[im 32/102  soft-tissue]
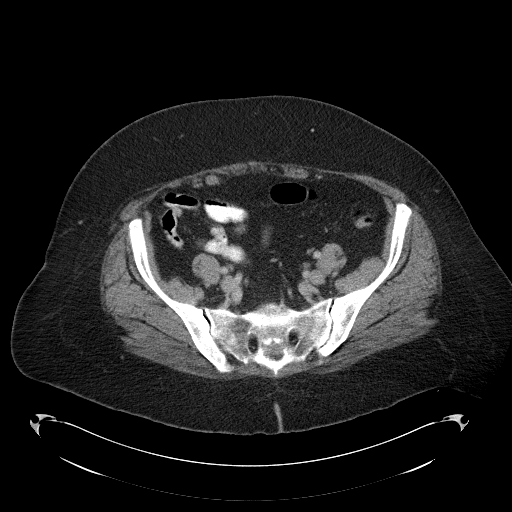
[im 43/102  soft-tissue]
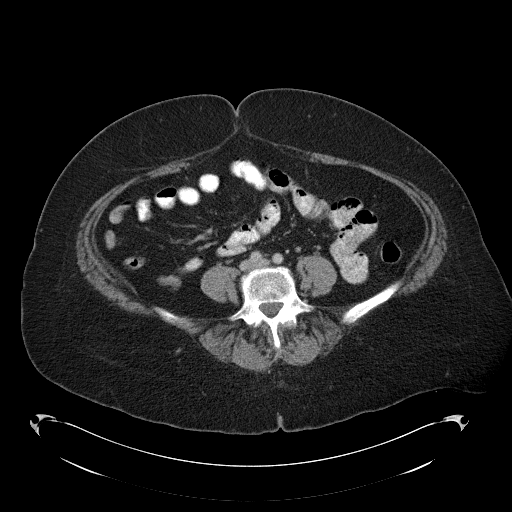
[im 48/102  soft-tissue]
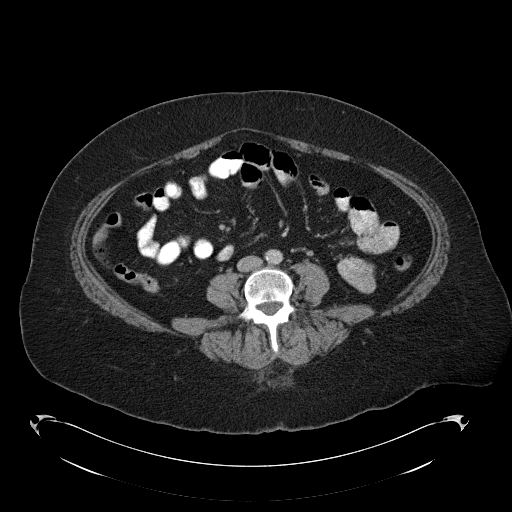
[im 54/102  soft-tissue]
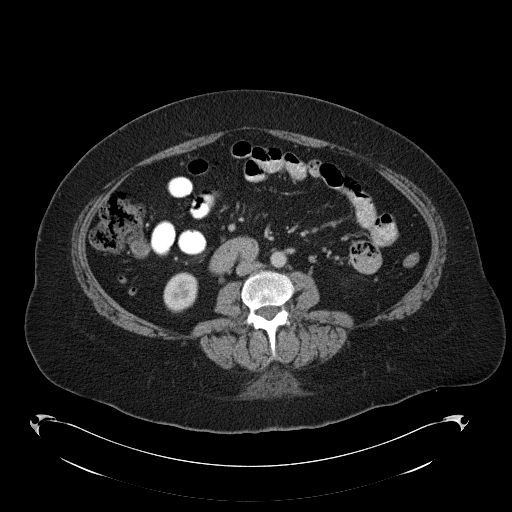
[im 59/102  soft-tissue]
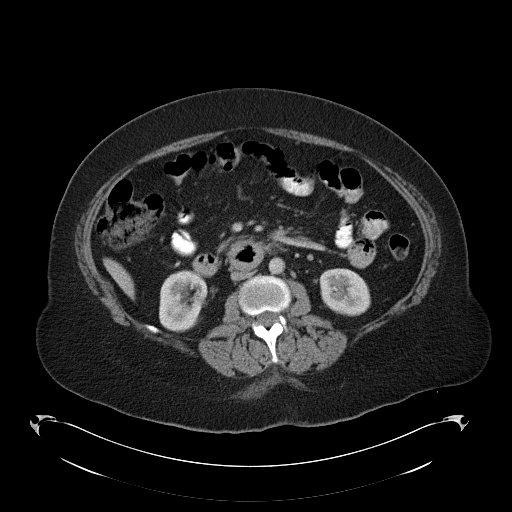
[im 59/102  bone]
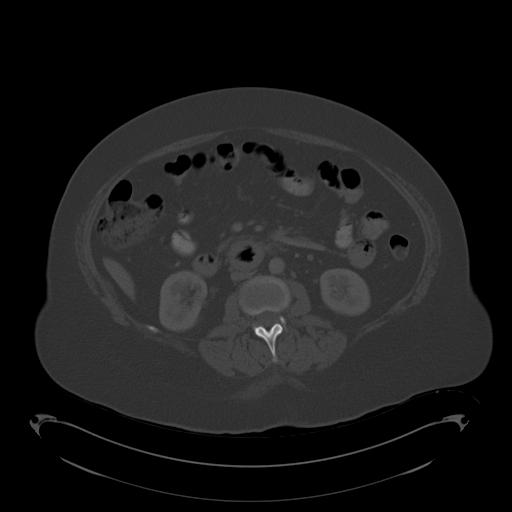
[im 70/102  soft-tissue]
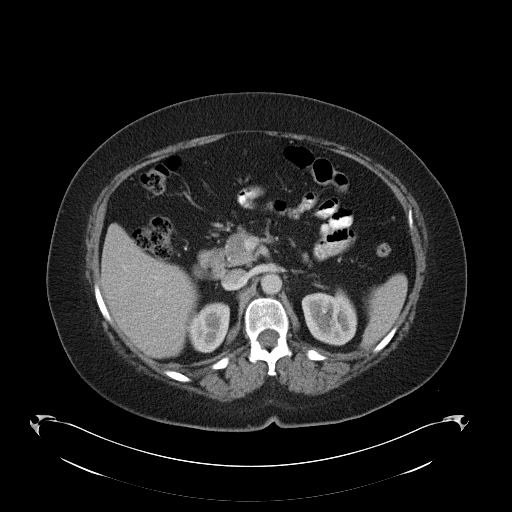
[im 75/102  soft-tissue]
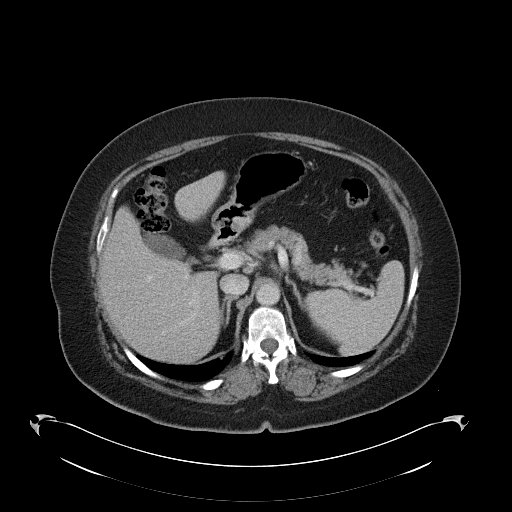
[im 80/102  soft-tissue]
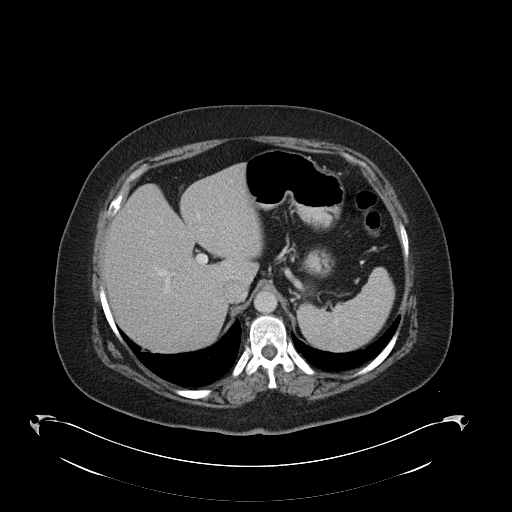
[im 91/102  soft-tissue]
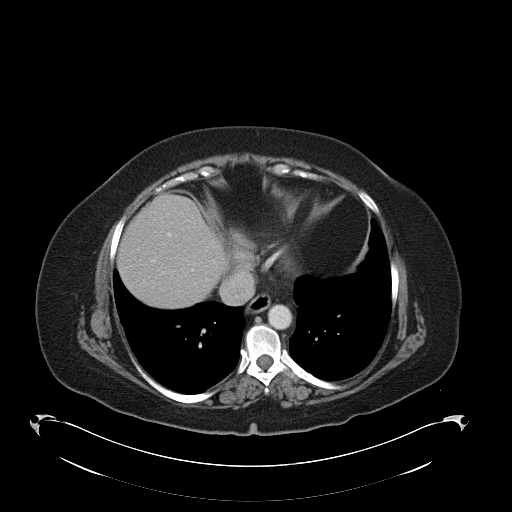
[im 96/102  soft-tissue]
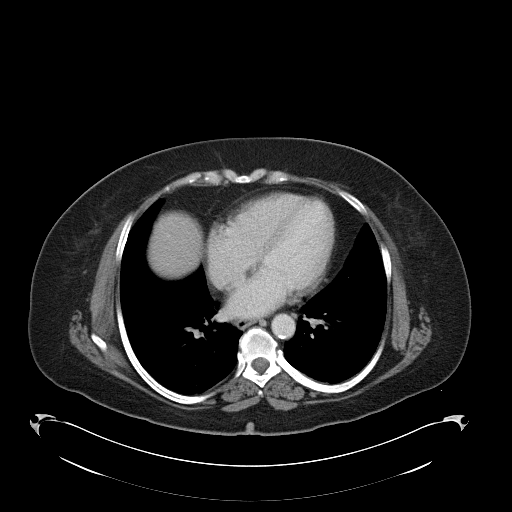

[Series 5: coronals · coronal · 0.99mm/px · 3 of 121 slices shown]
[im 41/121  soft-tissue]
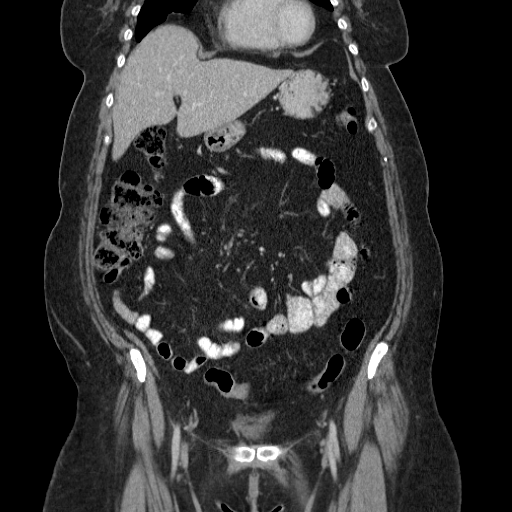
[im 54/121  soft-tissue]
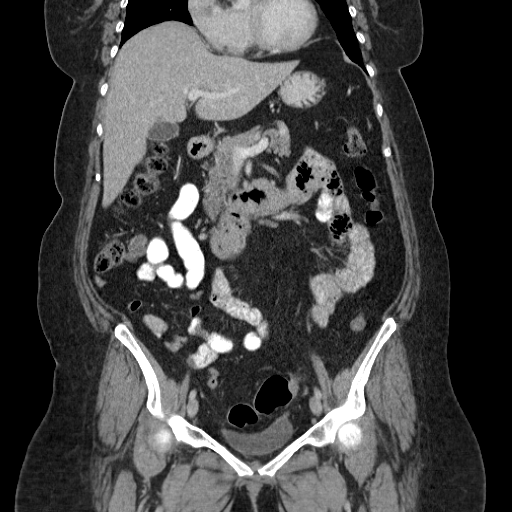
[im 67/121  soft-tissue]
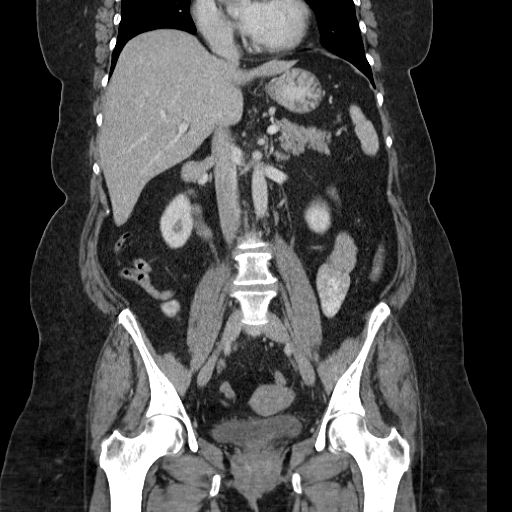

[17 of 46 positions shown; findings below may reference images not displayed]

FINDINGS: Lung bases are clear.  No effusions.  Heart is normal
size.

Liver, gallbladder, spleen, pancreas, adrenals and kidneys are
normal.  No renal or ureteral stones.  No hydronephrosis.  Uterus,
adnexa and urinary bladder are unremarkable.

Normal appendix. Bowel grossly unremarkable.  No free fluid, free
air, or adenopathy.  Negative for diverticulosis or diverticulitis.
Aorta is normal caliber.  No acute bony abnormality.
IMPRESSION: No acute findings in the abdomen or pelvis.

## 2013-07-14 ENCOUNTER — Telehealth: Payer: Self-pay | Admitting: Family Medicine

## 2013-07-14 NOTE — Telephone Encounter (Signed)
I received letter from orthopedic office, patient need knee arthroscopy, i called to let her know to schedule pre-operative evaluation is requested by ortho, message left for her to call back.

## 2013-07-15 ENCOUNTER — Encounter (HOSPITAL_COMMUNITY): Payer: Self-pay | Admitting: *Deleted

## 2013-07-15 ENCOUNTER — Telehealth: Payer: Self-pay | Admitting: Family Medicine

## 2013-07-15 ENCOUNTER — Emergency Department (HOSPITAL_COMMUNITY)
Admission: EM | Admit: 2013-07-15 | Discharge: 2013-07-15 | Disposition: A | Discharge status: Home / Self Care | Payer: Medicaid Other | Attending: Emergency Medicine | Admitting: Emergency Medicine

## 2013-07-15 ENCOUNTER — Other Ambulatory Visit (HOSPITAL_COMMUNITY): Payer: Medicaid Other

## 2013-07-15 ENCOUNTER — Emergency Department (HOSPITAL_COMMUNITY): Payer: Medicaid Other

## 2013-07-15 DIAGNOSIS — Z87891 Personal history of nicotine dependence: Secondary | ICD-10-CM | POA: Insufficient documentation

## 2013-07-15 DIAGNOSIS — K219 Gastro-esophageal reflux disease without esophagitis: Secondary | ICD-10-CM | POA: Insufficient documentation

## 2013-07-15 DIAGNOSIS — E669 Obesity, unspecified: Secondary | ICD-10-CM | POA: Insufficient documentation

## 2013-07-15 DIAGNOSIS — M129 Arthropathy, unspecified: Secondary | ICD-10-CM | POA: Insufficient documentation

## 2013-07-15 DIAGNOSIS — I10 Essential (primary) hypertension: Secondary | ICD-10-CM | POA: Insufficient documentation

## 2013-07-15 DIAGNOSIS — Z79899 Other long term (current) drug therapy: Secondary | ICD-10-CM | POA: Insufficient documentation

## 2013-07-15 DIAGNOSIS — F411 Generalized anxiety disorder: Secondary | ICD-10-CM | POA: Insufficient documentation

## 2013-07-15 DIAGNOSIS — I5032 Chronic diastolic (congestive) heart failure: Secondary | ICD-10-CM | POA: Insufficient documentation

## 2013-07-15 DIAGNOSIS — Z880 Allergy status to penicillin: Secondary | ICD-10-CM | POA: Insufficient documentation

## 2013-07-15 DIAGNOSIS — M25569 Pain in unspecified knee: Secondary | ICD-10-CM | POA: Insufficient documentation

## 2013-07-15 DIAGNOSIS — Z8679 Personal history of other diseases of the circulatory system: Secondary | ICD-10-CM | POA: Insufficient documentation

## 2013-07-15 DIAGNOSIS — I252 Old myocardial infarction: Secondary | ICD-10-CM | POA: Insufficient documentation

## 2013-07-15 DIAGNOSIS — E039 Hypothyroidism, unspecified: Secondary | ICD-10-CM | POA: Insufficient documentation

## 2013-07-15 DIAGNOSIS — G8929 Other chronic pain: Secondary | ICD-10-CM

## 2013-07-15 DIAGNOSIS — R05 Cough: Secondary | ICD-10-CM | POA: Insufficient documentation

## 2013-07-15 DIAGNOSIS — R059 Cough, unspecified: Secondary | ICD-10-CM | POA: Insufficient documentation

## 2013-07-15 DIAGNOSIS — Z7982 Long term (current) use of aspirin: Secondary | ICD-10-CM | POA: Insufficient documentation

## 2013-07-15 DIAGNOSIS — J45901 Unspecified asthma with (acute) exacerbation: Secondary | ICD-10-CM | POA: Insufficient documentation

## 2013-07-15 DIAGNOSIS — J441 Chronic obstructive pulmonary disease with (acute) exacerbation: Secondary | ICD-10-CM | POA: Insufficient documentation

## 2013-07-15 DIAGNOSIS — J449 Chronic obstructive pulmonary disease, unspecified: Secondary | ICD-10-CM

## 2013-07-15 LAB — CBC
HCT: 39.1 % (ref 36.0–46.0)
Hemoglobin: 13.1 g/dL (ref 12.0–15.0)
MCH: 28.2 pg (ref 26.0–34.0)
MCHC: 33.5 g/dL (ref 30.0–36.0)
MCV: 84.3 fL (ref 78.0–100.0)
Platelets: 268 10*3/uL (ref 150–400)
RBC: 4.64 MIL/uL (ref 3.87–5.11)
RDW: 14.1 % (ref 11.5–15.5)
WBC: 6.7 K/uL (ref 4.0–10.5)

## 2013-07-15 LAB — BASIC METABOLIC PANEL
BUN: 11 mg/dL (ref 6–23)
Calcium: 9.2 mg/dL (ref 8.4–10.5)
Creatinine, Ser: 0.83 mg/dL (ref 0.50–1.10)
GFR calc non Af Amer: 77 mL/min — ABNORMAL LOW (ref >90)
Glucose, Bld: 159 mg/dL — ABNORMAL HIGH (ref 70–99)
Sodium: 137 mEq/L (ref 135–145)

## 2013-07-15 LAB — BASIC METABOLIC PANEL WITH GFR
CO2: 22 meq/L (ref 19–32)
Chloride: 102 meq/L (ref 96–112)
GFR calc Af Amer: 90 mL/min — ABNORMAL LOW (ref >90)
Potassium: 3.5 meq/L (ref 3.5–5.1)

## 2013-07-15 LAB — POCT I-STAT TROPONIN I: Troponin i, poc: 0.01 ng/mL (ref 0.00–0.08)

## 2013-07-15 MED ORDER — ALBUTEROL SULFATE (5 MG/ML) 0.5% IN NEBU
5.0000 mg | INHALATION_SOLUTION | Freq: Once | RESPIRATORY_TRACT | Status: AC
  Administered 2013-07-15: 5 mg via RESPIRATORY_TRACT
  Filled 2013-07-15: qty 1

## 2013-07-15 MED ORDER — BUDESONIDE-FORMOTEROL FUMARATE 160-4.5 MCG/ACT IN AERO: 2.0000 | INHALATION_SPRAY | Freq: Two times a day (BID) | RESPIRATORY_TRACT | Status: DC

## 2013-07-15 MED ORDER — OXYCODONE-ACETAMINOPHEN 5-325 MG PO TABS: 1.0000 | ORAL_TABLET | ORAL | Status: DC | PRN

## 2013-07-15 NOTE — Telephone Encounter (Signed)
Patient calls stating that her inhaler that she is currently on is not helping her asthma symptoms. Would like to be prescribed another inhaler, maybe ?stronger. Also is is having a lot of pain in the knee and the current pain meds is not helping.

## 2013-07-15 NOTE — Telephone Encounter (Signed)
Pt reports that her knee is having a lot of pain and my inhaler is not working. "I need something stronger." Offered to make pt an appointment and she stated that she could wait until the 5th at previously scheduled appointment time. Reminded that she can call to make earlier appointment if needed. Wyatt Haste, RN-BSN

## 2013-07-15 NOTE — Telephone Encounter (Signed)
Will fwd to triage RN. Loza Prell L, CMA  

## 2013-07-15 NOTE — ED Provider Notes (Signed)
CSN: [id number]     Arrival date & time 07/15/13  1906 History     First MD Initiated Contact with Patient 07/15/13 2110     Chief Complaint  Patient presents with  . Knee Pain  . Shortness of Breath   (Consider location/radiation/quality/duration/timing/severity/associated sxs/prior Treatment) The history is provided by the patient and medical records.   Patient presents to the ED for chronic knee pain and shortness of breath. Patient has chronic right knee pain, no recent injury, trauma, or falls. Patient is seen by [facility] and is to be scheduled for a knee arthroscopy. Has undergone cortisone injections in the past, she states this only worked for a few days at a time.  Actually she takes tramadol for her pain, however this is not working.  Also complains of shortness of breath, not related to exertion.  Patient has a history of COPD.  Pt states she has a dry cough at baseline.  No recent fevers, sweats, or chills. No sick contacts.  Is currently out of her albuterol inhaler, however she states this does not help her sx.  Denies any chest pain or palpitations.  No recent travel, LE edema, or calf pain.  No hx of PE.  Attempted to call her PCP earlier today and call not returned prior to office closing.  Past Medical History  Diagnosis Date  . Chest pain     a. s/p reported MI in 1997;  b. 09/2012 Cath: nl cors, nl LV ([place]);  c. 05/2012 Non-ischemic myoview.  . Hypertension   . Arthritis   . Problem with literacy   . Shortness of breath   . Anxiety   . Bronchitis   . Chronic headaches   . Obesity   . COPD (chronic obstructive pulmonary disease)   . Chronic diastolic CHF (congestive heart failure)     a. 12/2011 Echo: nl ef, Gr 1 DD;  b. 11/2012 Echo: EF 45-50%, no significant valvular abnormalities.  Marland Kitchen GERD (gastroesophageal reflux disease)   . Myocardial infarction 1997    self reported  . Hypothyroidism   . Asthma   . Chronic chest pain    Past Surgical  History  Procedure Laterality Date  . Ectopic pregnancy surgery      right  . Nm myoview ltd  June 2013    no reversible ischemia  . Cardiac catheterization  October 2012    No coronary artery disease  . Cardiac catheterization  2012    normal coronary arteries  . Nm myoview ltd  2012, 2013    normal  . 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 Cancer Mother 13    lung ca  . Diabetes Mother   . Anxiety disorder Sister   . Cancer Brother 60    lung   History  Substance Use Topics  . Smoking status: Former Smoker -- 1.00 packs/day for 3 years    Quit date: 12/03/2010  . Smokeless tobacco: Never Used  . Alcohol Use: No   OB History   Grav Para Term Preterm Abortions TAB SAB Ect Mult Living                 Review of Systems  Respiratory: Positive for shortness of breath.   Musculoskeletal: Positive for arthralgias.  All other systems reviewed and are negative.    Allergies  Penicillins  Home Medications   Current Outpatient Rx  Name  Route  Sig  Dispense  Refill  . albuterol (PROVENTIL HFA;VENTOLIN HFA) 108 (90 BASE) MCG/ACT inhaler   Inhalation   Inhale 2 puffs into the lungs every 4 (four) hours as needed for wheezing or shortness of breath (cough).   1 Inhaler   1   . aspirin EC 81 MG tablet   Oral   Take 81 mg by mouth daily.         Marland Kitchen atorvastatin (LIPITOR) 10 MG tablet   Oral   Take 1 tablet (10 mg total) by mouth daily.   30 tablet   1   . benzonatate (TESSALON) 100 MG capsule   Oral   Take 1 capsule (100 mg total) by mouth 2 (two) times daily as needed for cough.   20 capsule   0   . docusate sodium (COLACE) 100 MG capsule   Oral   Take 100 mg by mouth 2 (two) times daily as needed. DO NOT TAKE if your bowel movements are loose. For constipation.         Marland Kitchen ibuprofen (ADVIL,MOTRIN) 600 MG tablet   Oral   Take 1 tablet (600 mg total) by mouth every 6 (six)  hours as needed for pain.   30 tablet   0   . lansoprazole (PREVACID) 30 MG capsule   Oral   Take 1 capsule (30 mg total) by mouth daily.   30 capsule   0   . levothyroxine (SYNTHROID, LEVOTHROID) 50 MCG tablet   Oral   Take 1 tablet (50 mcg total) by mouth daily.   30 tablet   2   . metoprolol succinate (TOPROL-XL) 25 MG 24 hr tablet   Oral   Take 1 tablet (25 mg total) by mouth daily.   30 tablet   1   . PARoxetine (PAXIL) 20 MG tablet   Oral   Take 20 mg by mouth daily.          BP 123/68  Pulse 77  Temp(Src) 98.3 F (36.8 C) (Oral)  Resp 18  SpO2 96%  Physical Exam  Nursing note and vitals reviewed. Constitutional: She is oriented to person, place, and time. She appears well-developed and well-nourished. No distress.  HENT:  Head: Normocephalic and atraumatic.  Mouth/Throat: Oropharynx is clear and moist.  Eyes: Conjunctivae and EOM are normal. Pupils are equal, round, and reactive to light.  Neck: Normal range of motion. Neck supple.  Cardiovascular: Normal rate, regular rhythm and normal heart sounds.   Pulmonary/Chest: Effort normal and breath sounds normal. No respiratory distress. She has no wheezes. She has no rales.  Lungs CTAB  Musculoskeletal: Normal range of motion.       Right knee: She exhibits normal range of motion, no swelling, no effusion, no ecchymosis, no deformity, no laceration, no erythema, normal alignment and no LCL laxity. Tenderness found. Medial joint line tenderness noted.  Right knee TTP along medial joint line, full ROM maintained, strong distal pulse, sensation intact, normal gait  Neurological: She is alert and oriented to person, place, and time.  Skin: Skin is warm and dry. She is not diaphoretic.  Psychiatric: She has a normal mood and affect.    ED Course   Procedures (including critical care time)  Labs Reviewed  BASIC METABOLIC PANEL - Abnormal; Notable for the following:    Glucose, Bld 159 (*)    GFR calc non Af  Amer 77 (*)  GFR calc Af Amer 90 (*)    All other components within normal limits  CBC  POCT I-STAT TROPONIN I   Dg Chest 2 View (if Patient Has Fever And/or Copd)  07/15/2013   *RADIOLOGY REPORT*  Clinical Data:  Shortness of breath and hypertension.  CHEST - 2 VIEW  Comparison: 06/06/2013  Findings: The heart size and mediastinal contours are within normal limits.  Both lungs are clear.  The visualized skeletal structures are unremarkable.  IMPRESSION: No active disease.   Original Report Authenticated By: Irish Lack, M.D.   1. COPD (chronic obstructive pulmonary disease)   2. Knee pain, chronic, right     MDM   Patient given treatment in triage which she states helped her symptoms. Doubt ACS or PE.  Atraumatic, acute on chronic right knee pain.  Rx percocet.  I have advised pt that i will prescribe symbicort inhaler but i would prefer her to speak with her PCP tomorrow before filling this, she acknowledged understanding and agreed to plan.  Return precautions advised.  Garlon Hatchet, PA-C 07/15/13 2143

## 2013-07-15 NOTE — ED Notes (Signed)
Presents with sob that began this am. Pt has HX of COPD reports that she has been out of her inhaler, but it did not help her anyway. Denies CP, bilateral lung sounds clear. Also c/o right knee pain that is chronic but today radiates to her ankle. No injury, CMS intact.

## 2013-07-15 NOTE — ED Provider Notes (Signed)
Medical screening examination/treatment/procedure(s) were performed by non-physician practitioner and as supervising physician I was immediately available for consultation/collaboration.   [name]. Oletta Lamas, MD 07/15/13 2150

## 2013-07-20 ENCOUNTER — Ambulatory Visit: Payer: Medicaid Other | Admitting: Family Medicine

## 2013-07-22 ENCOUNTER — Other Ambulatory Visit: Payer: Self-pay

## 2013-07-22 ENCOUNTER — Ambulatory Visit (HOSPITAL_COMMUNITY): Payer: Medicaid Other | Attending: Cardiology | Admitting: Radiology

## 2013-07-22 DIAGNOSIS — R059 Cough, unspecified: Secondary | ICD-10-CM | POA: Insufficient documentation

## 2013-07-22 DIAGNOSIS — E669 Obesity, unspecified: Secondary | ICD-10-CM | POA: Insufficient documentation

## 2013-07-22 DIAGNOSIS — I252 Old myocardial infarction: Secondary | ICD-10-CM | POA: Insufficient documentation

## 2013-07-22 DIAGNOSIS — I509 Heart failure, unspecified: Secondary | ICD-10-CM

## 2013-07-22 DIAGNOSIS — I493 Ventricular premature depolarization: Secondary | ICD-10-CM

## 2013-07-22 DIAGNOSIS — I10 Essential (primary) hypertension: Secondary | ICD-10-CM | POA: Insufficient documentation

## 2013-07-22 DIAGNOSIS — Z87891 Personal history of nicotine dependence: Secondary | ICD-10-CM | POA: Insufficient documentation

## 2013-07-22 DIAGNOSIS — R05 Cough: Secondary | ICD-10-CM | POA: Insufficient documentation

## 2013-07-22 DIAGNOSIS — J449 Chronic obstructive pulmonary disease, unspecified: Secondary | ICD-10-CM | POA: Insufficient documentation

## 2013-07-22 DIAGNOSIS — I428 Other cardiomyopathies: Secondary | ICD-10-CM

## 2013-07-22 DIAGNOSIS — J4489 Other specified chronic obstructive pulmonary disease: Secondary | ICD-10-CM | POA: Insufficient documentation

## 2013-07-22 NOTE — Progress Notes (Signed)
Echocardiogram performed.  

## 2013-07-26 ENCOUNTER — Telehealth: Payer: Self-pay | Admitting: Internal Medicine

## 2013-07-26 NOTE — Telephone Encounter (Signed)
Patient aware.

## 2013-07-26 NOTE — Telephone Encounter (Signed)
Follow Up ° ° ° ° ° °Pt following up on test results. Please call. °

## 2013-07-30 ENCOUNTER — Emergency Department (HOSPITAL_COMMUNITY): Payer: Medicaid Other

## 2013-07-30 ENCOUNTER — Emergency Department (HOSPITAL_COMMUNITY)
Admission: EM | Admit: 2013-07-30 | Discharge: 2013-07-31 | Disposition: A | Discharge status: Home / Self Care | Payer: Medicaid Other | Attending: Emergency Medicine | Admitting: Emergency Medicine

## 2013-07-30 ENCOUNTER — Encounter (HOSPITAL_COMMUNITY): Payer: Self-pay | Admitting: *Deleted

## 2013-07-30 DIAGNOSIS — G8929 Other chronic pain: Secondary | ICD-10-CM | POA: Insufficient documentation

## 2013-07-30 DIAGNOSIS — Z8679 Personal history of other diseases of the circulatory system: Secondary | ICD-10-CM | POA: Insufficient documentation

## 2013-07-30 DIAGNOSIS — Y929 Unspecified place or not applicable: Secondary | ICD-10-CM | POA: Insufficient documentation

## 2013-07-30 DIAGNOSIS — M549 Dorsalgia, unspecified: Secondary | ICD-10-CM | POA: Insufficient documentation

## 2013-07-30 DIAGNOSIS — Z87891 Personal history of nicotine dependence: Secondary | ICD-10-CM | POA: Insufficient documentation

## 2013-07-30 DIAGNOSIS — M129 Arthropathy, unspecified: Secondary | ICD-10-CM | POA: Insufficient documentation

## 2013-07-30 DIAGNOSIS — L02619 Cutaneous abscess of unspecified foot: Secondary | ICD-10-CM | POA: Insufficient documentation

## 2013-07-30 DIAGNOSIS — Y939 Activity, unspecified: Secondary | ICD-10-CM | POA: Insufficient documentation

## 2013-07-30 DIAGNOSIS — E039 Hypothyroidism, unspecified: Secondary | ICD-10-CM | POA: Insufficient documentation

## 2013-07-30 DIAGNOSIS — W108XXA Fall (on) (from) other stairs and steps, initial encounter: Secondary | ICD-10-CM | POA: Insufficient documentation

## 2013-07-30 DIAGNOSIS — Z880 Allergy status to penicillin: Secondary | ICD-10-CM | POA: Insufficient documentation

## 2013-07-30 DIAGNOSIS — I252 Old myocardial infarction: Secondary | ICD-10-CM | POA: Insufficient documentation

## 2013-07-30 DIAGNOSIS — L03119 Cellulitis of unspecified part of limb: Secondary | ICD-10-CM | POA: Insufficient documentation

## 2013-07-30 DIAGNOSIS — L03115 Cellulitis of right lower limb: Secondary | ICD-10-CM

## 2013-07-30 DIAGNOSIS — L539 Erythematous condition, unspecified: Secondary | ICD-10-CM | POA: Insufficient documentation

## 2013-07-30 DIAGNOSIS — Z7982 Long term (current) use of aspirin: Secondary | ICD-10-CM | POA: Insufficient documentation

## 2013-07-30 DIAGNOSIS — J449 Chronic obstructive pulmonary disease, unspecified: Secondary | ICD-10-CM | POA: Insufficient documentation

## 2013-07-30 DIAGNOSIS — E669 Obesity, unspecified: Secondary | ICD-10-CM | POA: Insufficient documentation

## 2013-07-30 DIAGNOSIS — J40 Bronchitis, not specified as acute or chronic: Secondary | ICD-10-CM | POA: Insufficient documentation

## 2013-07-30 DIAGNOSIS — Z8709 Personal history of other diseases of the respiratory system: Secondary | ICD-10-CM | POA: Insufficient documentation

## 2013-07-30 DIAGNOSIS — IMO0002 Reserved for concepts with insufficient information to code with codable children: Secondary | ICD-10-CM | POA: Insufficient documentation

## 2013-07-30 DIAGNOSIS — I10 Essential (primary) hypertension: Secondary | ICD-10-CM | POA: Insufficient documentation

## 2013-07-30 DIAGNOSIS — F419 Anxiety disorder, unspecified: Secondary | ICD-10-CM

## 2013-07-30 DIAGNOSIS — M7989 Other specified soft tissue disorders: Secondary | ICD-10-CM | POA: Insufficient documentation

## 2013-07-30 DIAGNOSIS — Z79899 Other long term (current) drug therapy: Secondary | ICD-10-CM | POA: Insufficient documentation

## 2013-07-30 DIAGNOSIS — Z8659 Personal history of other mental and behavioral disorders: Secondary | ICD-10-CM | POA: Insufficient documentation

## 2013-07-30 DIAGNOSIS — Z8719 Personal history of other diseases of the digestive system: Secondary | ICD-10-CM | POA: Insufficient documentation

## 2013-07-30 DIAGNOSIS — J4489 Other specified chronic obstructive pulmonary disease: Secondary | ICD-10-CM | POA: Insufficient documentation

## 2013-07-30 DIAGNOSIS — I5032 Chronic diastolic (congestive) heart failure: Secondary | ICD-10-CM | POA: Insufficient documentation

## 2013-07-30 NOTE — ED Notes (Signed)
Pt in c/o lower back pain which she states is chronic, also right foot swelling since falling on some stairs recently, also states "my insides are shaking", pt unable to explain what this means in other words, denies pain, states symptoms have been going on x2 days, states she is having trouble sleeping and feels restless

## 2013-07-30 NOTE — ED Notes (Addendum)
(  denies: nvd, fever, constipation, bleeding, cough congestion, cold sx, dizziness ), states, "always sob d/t COPD and bronchitis", c/o R foot pain, abrasion noted, CMS/ROM intact, (xray negative), also "chronic low back pain", also mentions HA & blurry vision sometimes ("not now"), also shaky all over inside. No shakiness or tremors noted. Skin smooth. Alert, NAD, calm, interactive, skin W&D, resps e/u, speaking in clear complete sentences, steady gait, VSS. friend at [facility].

## 2013-07-31 MED ORDER — HYDROCODONE-ACETAMINOPHEN 5-325 MG PO TABS: 1.0000 | ORAL_TABLET | ORAL | Status: DC | PRN

## 2013-07-31 MED ORDER — DOXYCYCLINE HYCLATE 100 MG PO CAPS: 100.0000 mg | ORAL_CAPSULE | Freq: Two times a day (BID) | ORAL | Status: DC

## 2013-07-31 NOTE — ED Provider Notes (Signed)
CSN: [id number]     Arrival date & time 07/30/13  2109 History     First MD Initiated Contact with Patient 07/30/13 2323     Chief Complaint  Patient presents with  . Back Pain  . Foot Swelling   (Consider location/radiation/quality/duration/timing/severity/associated sxs/prior Treatment) HPI History provided by pt.   Pt presents w/ multiple complaints.  Slipped outside 5 days ago and hit the dorsum of her right foot on a step.  Sustained a superficial abrasion.  ~2d later, she developed pain in this location. Aggravated by bearing weight.   Associated w/ erythema and edema.  No drainage from wound or fever.  Also c/o feeling shaky for the past three days.  She is not tremulous, it just feels like her insides are shaking.  Worse at night when she gets into bed.  No other neurologic complaints.  H/o anxiety.  Past Medical History  Diagnosis Date  . Chest pain     a. s/p reported MI in 1997;  b. 09/2012 Cath: nl cors, nl LV ([place]);  c. 05/2012 Non-ischemic myoview.  . Hypertension   . Arthritis   . Problem with literacy   . Shortness of breath   . Anxiety   . Bronchitis   . Chronic headaches   . Obesity   . COPD (chronic obstructive pulmonary disease)   . Chronic diastolic CHF (congestive heart failure)     a. 12/2011 Echo: nl ef, Gr 1 DD;  b. 11/2012 Echo: EF 45-50%, no significant valvular abnormalities.  Marland Kitchen GERD (gastroesophageal reflux disease)   . Myocardial infarction 1997    self reported  . Hypothyroidism   . Asthma   . Chronic chest pain    Past Surgical History  Procedure Laterality Date  . Ectopic pregnancy surgery      right  . Nm myoview ltd  June 2013    no reversible ischemia  . Cardiac catheterization  October 2012    No coronary artery disease  . Cardiac catheterization  2012    normal coronary arteries  . Nm myoview ltd  2012, 2013    normal  . 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 Cancer Mother 56    lung ca  . Diabetes Mother   . Anxiety disorder Sister   . Cancer Brother 60    lung   History  Substance Use Topics  . Smoking status: Former Smoker -- 1.00 packs/day for 3 years    Quit date: 12/03/2010  . Smokeless tobacco: Never Used  . Alcohol Use: No   OB History   Grav Para Term Preterm Abortions TAB SAB Ect Mult Living                 Review of Systems  All other systems reviewed and are negative.    Allergies  Penicillins  Home Medications   Current Outpatient Rx  Name  Route  Sig  Dispense  Refill  . albuterol (PROVENTIL HFA;VENTOLIN HFA) 108 (90 BASE) MCG/ACT inhaler   Inhalation   Inhale 2 puffs into the lungs every 4 (four) hours as needed for wheezing or shortness of breath (cough).   1 Inhaler   1   . aspirin EC 81 MG tablet   Oral   Take 81 mg by mouth daily.         Marland Kitchen atorvastatin (LIPITOR) 10  MG tablet   Oral   Take 1 tablet (10 mg total) by mouth daily.   30 tablet   1   . levothyroxine (SYNTHROID, LEVOTHROID) 50 MCG tablet   Oral   Take 1 tablet (50 mcg total) by mouth daily.   30 tablet   2   . metoprolol succinate (TOPROL-XL) 25 MG 24 hr tablet   Oral   Take 1 tablet (25 mg total) by mouth daily.   30 tablet   1   . oxyCODONE-acetaminophen (PERCOCET/ROXICET) 5-325 MG per tablet   Oral   Take 1 tablet by mouth every 4 (four) hours as needed for pain.   15 tablet   0   . doxycycline (VIBRAMYCIN) 100 MG capsule   Oral   Take 1 capsule (100 mg total) by mouth 2 (two) times daily.   14 capsule   0   . HYDROcodone-acetaminophen (NORCO/VICODIN) 5-325 MG per tablet   Oral   Take 1 tablet by mouth every 4 (four) hours as needed for pain.   15 tablet   0    BP 135/77  Pulse 66  Temp(Src) 98.2 F (36.8 C) (Oral)  Resp 16  Wt 225 lb (102.059 kg)  BMI 34.22 kg/m2  SpO2 97% Physical Exam  Nursing note and vitals reviewed. Constitutional: She is oriented to person, place, and time.  She appears well-developed and well-nourished. No distress.  HENT:  Head: Normocephalic and atraumatic.  Eyes:  Normal appearance  Neck: Normal range of motion.  Cardiovascular: Normal rate and regular rhythm.   Pulmonary/Chest: Effort normal and breath sounds normal. No respiratory distress.  Musculoskeletal: Normal range of motion.  Superficial, scabbed abrasion, ~2cm in diameter, on dorsum of right forefoot.  3-4cm well demarcated surrounding erythema.  Foot mildly edematous compared to left and is ttp.  No edema of ankle or calf.  2+ DP pulse and distal sensation intact.    Neurological: She is alert and oriented to person, place, and time.  Skin: Skin is warm and dry. No rash noted.  Psychiatric: She has a normal mood and affect. Her behavior is normal.    ED Course   Procedures (including critical care time)  Labs Reviewed - No data to display Dg Foot Complete Right  07/30/2013   *RADIOLOGY REPORT*  Clinical Data: Soft tissue swelling; recent trauma  RIGHT FOOT COMPLETE - 3+ VIEW  Comparison: None.  Findings: Frontal, oblique, and lateral views were obtained.  No fracture or dislocation. Joint spaces appear intact.  No radiopaque foreign body.  There is soft tissue swelling over the dorsum of foot.  No erosive change or bony destruction.  IMPRESSION: Soft tissue swelling over dorsum of foot.  No fracture or dislocation.   Original Report Authenticated By: Bretta Bang, M.D.   1. Cellulitis of right foot   2. Anxiety     MDM  57yo F presents w/ superficial abrasion to dorsum of right foot w/ surrounding cellulitis.  There is no drainable abscess.  She is allergic to penicillin; will treat w/ doxy.  Margins of erythema drawn by nursing staff.  Pt also c/o feeling "shaky" inside her body.  No tremors or other neurologic sx.  Suspect that this is d/t anxiety; prior history but untreated.  Recommended f/u with her PCP.  Return precautions discussed.   Otilio Miu,  PA-C 07/31/13 1859

## 2013-07-31 NOTE — ED Notes (Signed)
Abrasion on anterior R ankle with scant area of redness around open skin. Abrasion ~ 7mm by 10mm, redness 35mm by 40mm. Redness marked with purple skin marker (dotted line).

## 2013-08-01 NOTE — ED Provider Notes (Signed)
Medical screening examination/treatment/procedure(s) were performed by non-physician practitioner and as supervising physician I was immediately available for consultation/collaboration.   [name], MD 08/01/13 [phone]

## 2013-08-02 ENCOUNTER — Ambulatory Visit: Payer: Medicaid Other

## 2013-08-05 ENCOUNTER — Telehealth: Payer: Self-pay | Admitting: *Deleted

## 2013-08-05 NOTE — Telephone Encounter (Signed)
John,  Please review this pt's history to make sure she is ok for [facility]  Thanks, [employer]

## 2013-08-06 NOTE — Telephone Encounter (Signed)
     Baxter Hire,  I have reviewed this patient's chart and she is OK for [facility].  Thanks,  Cathlyn Parsons

## 2013-08-06 NOTE — Telephone Encounter (Signed)
[name],  I have reviewed this patient's chart and she is OK for [facility].  Thanks,  [name]

## 2013-08-13 HISTORY — PX: KNEE ARTHROSCOPY: SUR90

## 2013-08-17 ENCOUNTER — Telehealth: Payer: Self-pay | Admitting: *Deleted

## 2013-08-17 ENCOUNTER — Ambulatory Visit (AMBULATORY_SURGERY_CENTER): Payer: Medicaid Other

## 2013-08-17 VITALS — Ht 68.0 in | Wt 210.0 lb

## 2013-08-17 DIAGNOSIS — Z1211 Encounter for screening for malignant neoplasm of colon: Secondary | ICD-10-CM

## 2013-08-17 MED ORDER — MOVIPREP 100 G PO SOLR: 1.0000 | Freq: Once | ORAL | Status: DC

## 2013-08-17 NOTE — Telephone Encounter (Signed)
Patient has an appt for pre-visit for colonoscopy work-up.  [facility] calling to request NPI # for appt.  NPI # given.  Office states they will call back again for another NPI  approval for colonoscopy appt with Dr. Rhea Belton on 08/23/13.  Gaylene Brooks, RN

## 2013-08-23 ENCOUNTER — Ambulatory Visit (AMBULATORY_SURGERY_CENTER): Payer: Medicaid Other | Admitting: Internal Medicine

## 2013-08-23 ENCOUNTER — Encounter: Payer: Self-pay | Admitting: Internal Medicine

## 2013-08-23 VITALS — BP 116/62 | HR 63 | Temp 97.4°F | Resp 16 | Ht 68.0 in | Wt 210.0 lb

## 2013-08-23 DIAGNOSIS — Z1211 Encounter for screening for malignant neoplasm of colon: Secondary | ICD-10-CM

## 2013-08-23 DIAGNOSIS — D126 Benign neoplasm of colon, unspecified: Secondary | ICD-10-CM

## 2013-08-23 MED ORDER — SODIUM CHLORIDE 0.9 % IV SOLN: 500.0000 mL | INTRAVENOUS | Status: DC

## 2013-08-23 NOTE — Progress Notes (Signed)
Pt states she hs not taken her Toprol XL 25 mg "in some time".  She stated she ran out of it and has not been able to afford it. Maw

## 2013-08-23 NOTE — Progress Notes (Signed)
Patient did not experience any of the following events: a burn prior to discharge; a fall within the facility; wrong site/side/patient/procedure/implant event; or a hospital transfer or hospital admission upon discharge from the facility. (G8907) Patient did not have preoperative order for IV antibiotic SSI prophylaxis. (G8918)  

## 2013-08-23 NOTE — Op Note (Signed)
[facility] [address] [place], [address]   COLONOSCOPY PROCEDURE REPORT  PATIENT: [name], [name]  MR#: [id number] BIRTHDATE: 10-19-1956 , 57  yrs. old GENDER: Female ENDOSCOPIST: Beverley Fiedler, MD REFERRED BY: Janit Pagan, MD PROCEDURE DATE:  08/23/2013 PROCEDURE:   Colonoscopy with snare polypectomy First Screening Colonoscopy - Avg.  risk and is 50 yrs.  old or older Yes.  Prior Negative Screening - Now for repeat screening. N/A  History of Adenoma - Now for follow-up colonoscopy & has been > or = to 3 yrs.  N/A  Polyps Removed Today? Yes. ASA CLASS:   Class III INDICATIONS:average risk screening and first colonoscopy. MEDICATIONS: MAC sedation, administered by CRNA and propofol (Diprivan) 300mg  IV  DESCRIPTION OF PROCEDURE:   After the risks benefits and alternatives of the procedure were thoroughly explained, informed consent was obtained.  A digital rectal exam revealed external hemorrhoids.   The LB [id number]  endoscope was introduced through the anus and advanced to the cecum, which was identified by both the appendix and ileocecal valve. No adverse events experienced.   The quality of the prep was Moviprep fair  The instrument was then slowly withdrawn as the colon was fully examined.  COLON FINDINGS: A sessile polyp measuring 5 mm in size was found in the transverse colon.  A polypectomy was performed with a cold snare.  The resection was complete and the polyp tissue was completely retrieved.   Two sessile polyps ranging between 3-11mm in size were found in the sigmoid colon.  Polypectomy was performed using cold snare.  All resections were complete and all polyp tissue was completely retrieved. Other normal colonic mucosa. Retroflexed views revealed no abnormalities. The time to cecum=3 minutes 00 seconds.  Withdrawal time=16 minutes 45 seconds.  The scope was withdrawn and the procedure completed.  COMPLICATIONS: There were no  complications.  ENDOSCOPIC IMPRESSION: 1.   Sessile polyp measuring 5 mm in size was found in the transverse colon; polypectomy was performed with a cold snare 2.   Two sessile polyps ranging between 3-2mm in size were found in the sigmoid colon; Polypectomy was performed using cold snare  RECOMMENDATIONS: 1.  Await pathology results 2.  Timing of repeat colonoscopy will be determined by pathology findings. 3.  You will receive a letter within 1-2 weeks with the results of your biopsy as well as final recommendations.  Please call my office if you have not received a letter after 3 weeks.   eSigned:  Beverley Fiedler, MD 08/23/2013 12:03 PM  cc: The Patient; Janit Pagan, MD

## 2013-08-23 NOTE — Patient Instructions (Signed)
YOU HAD AN ENDOSCOPIC PROCEDURE TODAY AT THE [facility]: Refer to the procedure report that was given to you for any specific questions about what was found during the examination.  If the procedure report does not answer your questions, please call your gastroenterologist to clarify.  If you requested that your care partner not be given the details of your procedure findings, then the procedure report has been included in a sealed envelope for you to review at your convenience later.  YOU SHOULD EXPECT: Some feelings of bloating in the abdomen. Passage of more gas than usual.  Walking can help get rid of the air that was put into your GI tract during the procedure and reduce the bloating. If you had a lower endoscopy (such as a colonoscopy or flexible sigmoidoscopy) you may notice spotting of blood in your stool or on the toilet paper. If you underwent a bowel prep for your procedure, then you may not have a normal bowel movement for a few days.  DIET: Your first meal following the procedure should be a light meal and then it is ok to progress to your normal diet.  A half-sandwich or bowl of soup is an example of a good first meal.  Heavy or fried foods are harder to digest and may make you feel nauseous or bloated.  Likewise meals heavy in dairy and vegetables can cause extra gas to form and this can also increase the bloating.  Drink plenty of fluids but you should avoid alcoholic beverages for 24 hours.  ACTIVITY: Your care partner should take you home directly after the procedure.  You should plan to take it easy, moving slowly for the rest of the day.  You can resume normal activity the day after the procedure however you should NOT DRIVE or use heavy machinery for 24 hours (because of the sedation medicines used during the test).    SYMPTOMS TO REPORT IMMEDIATELY: A gastroenterologist can be reached at any hour.  During normal business hours, 8:30 AM to 5:00 PM Monday through Friday,  call [phone].  After hours and on weekends, please call the GI answering service at [phone] who will take a message and have the physician on call contact you.   Following lower endoscopy (colonoscopy or flexible sigmoidoscopy):  Excessive amounts of blood in the stool  Significant tenderness or worsening of abdominal pains  Swelling of the abdomen that is new, acute  Fever of 100F or higher    FOLLOW UP: If any biopsies were taken you will be contacted by phone or by letter within the next 1-3 weeks.  Call your gastroenterologist if you have not heard about the biopsies in 3 weeks.  Our staff will call the home number listed on your records the next business day following your procedure to check on you and address any questions or concerns that you may have at that time regarding the information given to you following your procedure. This is a courtesy call and so if there is no answer at the home number and we have not heard from you through the emergency physician on call, we will assume that you have returned to your regular daily activities without incident.  SIGNATURES/CONFIDENTIALITY: You and/or your care partner have signed paperwork which will be entered into your electronic medical record.  These signatures attest to the fact that that the information above on your After Visit Summary has been reviewed and is understood.  Full responsibility of the confidentiality   of this discharge information lies with you and/or your care-partner.     

## 2013-08-23 NOTE — Progress Notes (Signed)
Called to room to assist during endoscopic procedure.  Patient ID and intended procedure confirmed with present staff. Received instructions for my participation in the procedure from the performing physician.  

## 2013-08-24 ENCOUNTER — Telehealth: Payer: Self-pay | Admitting: *Deleted

## 2013-08-24 NOTE — Telephone Encounter (Signed)
  Follow up Call-  Call back number 08/23/2013  Post procedure Call Back phone  # [phone] cell  Permission to leave phone message Yes     Patient questions:  Do you have a fever, pain , or abdominal swelling? no Pain Score  0 *  Have you tolerated food without any problems? yes  Have you been able to return to your normal activities? yes  Do you have any questions about your discharge instructions: Diet   no Medications  no Follow up visit  no  Do you have questions or concerns about your Care? no  Actions: * If pain score is 4 or above: No action needed, pain <4.

## 2013-08-26 ENCOUNTER — Encounter: Payer: Self-pay | Admitting: Internal Medicine

## 2013-09-30 ENCOUNTER — Emergency Department (HOSPITAL_COMMUNITY)
Admission: EM | Admit: 2013-09-30 | Discharge: 2013-09-30 | Disposition: A | Discharge status: Home / Self Care | Payer: Medicaid Other | Attending: Emergency Medicine | Admitting: Emergency Medicine

## 2013-09-30 ENCOUNTER — Encounter (HOSPITAL_COMMUNITY): Payer: Self-pay | Admitting: Emergency Medicine

## 2013-09-30 DIAGNOSIS — E039 Hypothyroidism, unspecified: Secondary | ICD-10-CM | POA: Insufficient documentation

## 2013-09-30 DIAGNOSIS — I5032 Chronic diastolic (congestive) heart failure: Secondary | ICD-10-CM | POA: Insufficient documentation

## 2013-09-30 DIAGNOSIS — Z880 Allergy status to penicillin: Secondary | ICD-10-CM | POA: Insufficient documentation

## 2013-09-30 DIAGNOSIS — E669 Obesity, unspecified: Secondary | ICD-10-CM | POA: Insufficient documentation

## 2013-09-30 DIAGNOSIS — I252 Old myocardial infarction: Secondary | ICD-10-CM | POA: Insufficient documentation

## 2013-09-30 DIAGNOSIS — M25469 Effusion, unspecified knee: Secondary | ICD-10-CM | POA: Insufficient documentation

## 2013-09-30 DIAGNOSIS — J449 Chronic obstructive pulmonary disease, unspecified: Secondary | ICD-10-CM | POA: Insufficient documentation

## 2013-09-30 DIAGNOSIS — M545 Low back pain, unspecified: Secondary | ICD-10-CM | POA: Insufficient documentation

## 2013-09-30 DIAGNOSIS — Z8719 Personal history of other diseases of the digestive system: Secondary | ICD-10-CM | POA: Insufficient documentation

## 2013-09-30 DIAGNOSIS — J4489 Other specified chronic obstructive pulmonary disease: Secondary | ICD-10-CM | POA: Insufficient documentation

## 2013-09-30 DIAGNOSIS — M25461 Effusion, right knee: Secondary | ICD-10-CM

## 2013-09-30 DIAGNOSIS — Z7982 Long term (current) use of aspirin: Secondary | ICD-10-CM | POA: Insufficient documentation

## 2013-09-30 DIAGNOSIS — M129 Arthropathy, unspecified: Secondary | ICD-10-CM | POA: Insufficient documentation

## 2013-09-30 DIAGNOSIS — I10 Essential (primary) hypertension: Secondary | ICD-10-CM | POA: Insufficient documentation

## 2013-09-30 DIAGNOSIS — Z87891 Personal history of nicotine dependence: Secondary | ICD-10-CM | POA: Insufficient documentation

## 2013-09-30 DIAGNOSIS — G8929 Other chronic pain: Secondary | ICD-10-CM | POA: Insufficient documentation

## 2013-09-30 DIAGNOSIS — Z8659 Personal history of other mental and behavioral disorders: Secondary | ICD-10-CM | POA: Insufficient documentation

## 2013-09-30 DIAGNOSIS — Z79899 Other long term (current) drug therapy: Secondary | ICD-10-CM | POA: Insufficient documentation

## 2013-09-30 MED ORDER — HYDROCODONE-ACETAMINOPHEN 5-325 MG PO TABS
2.0000 | ORAL_TABLET | Freq: Once | ORAL | Status: AC
  Administered 2013-09-30: 2 via ORAL
  Filled 2013-09-30: qty 2

## 2013-09-30 MED ORDER — HYDROCODONE-ACETAMINOPHEN 5-325 MG PO TABS: 1.0000 | ORAL_TABLET | ORAL | Status: DC | PRN

## 2013-09-30 NOTE — ED Provider Notes (Signed)
CSN: [id number]     Arrival date & time 09/30/13  2048 History   First MD Initiated Contact with Patient 09/30/13 2112     Chief Complaint  Patient presents with  . Knee Pain  . Back Pain   (Consider location/radiation/quality/duration/timing/severity/associated sxs/prior Treatment) HPI Comments: [name] is a 57 y.o. Female who presents for evaluation of right knee pain. The pain is worsened in the 3 weeks, since her arthroscopy. No specific trauma. She's not using any medications. She does not have a knee immobilizer. She has chronic right knee pain. She denies fever, chills, nausea, vomiting, weakness, or dizziness. She has mild, low back pain. There are no other known modifying factors  Patient is a 57 y.o. female presenting with knee pain and back pain.  Knee Pain Associated symptoms: back pain   Back Pain   Past Medical History  Diagnosis Date  . Chest pain     a. s/p reported MI in 1997;  b. 09/2012 Cath: nl cors, nl LV ([place]);  c. 05/2012 Non-ischemic myoview.  . Hypertension   . Arthritis   . Problem with literacy   . Shortness of breath   . Anxiety   . Bronchitis   . Chronic headaches   . Obesity   . COPD (chronic obstructive pulmonary disease)   . Chronic diastolic CHF (congestive heart failure)     a. 12/2011 Echo: nl ef, Gr 1 DD;  b. 11/2012 Echo: EF 45-50%, no significant valvular abnormalities.  Marland Kitchen GERD (gastroesophageal reflux disease)   . Myocardial infarction 1997    self reported  . Hypothyroidism   . Asthma   . Chronic chest pain    Past Surgical History  Procedure Laterality Date  . Ectopic pregnancy surgery      right  . Nm myoview ltd  June 2013    no reversible ischemia  . Cardiac catheterization  October 2012    No coronary artery disease  . Cardiac catheterization  2012    normal coronary arteries  . Nm myoview ltd  2012, 2013    normal  . 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 Knee arthroscopy   08/13/13    right   Family History  Problem Relation Age of Onset  . Cancer Mother 79    lung ca  . Diabetes Mother   . Anxiety disorder Sister   . Cancer Brother 60    lung  . Colon cancer Neg Hx   . Pancreatic cancer Neg Hx   . Rectal cancer Neg Hx   . Stomach cancer Neg Hx    History  Substance Use Topics  . Smoking status: Former Smoker -- 1.00 packs/day for 3 years    Quit date: 12/03/2010  . Smokeless tobacco: Never Used  . Alcohol Use: No   OB History   Grav Para Term Preterm Abortions TAB SAB Ect Mult Living                 Review of Systems  Musculoskeletal: Positive for back pain.  All other systems reviewed and are negative.    Allergies  Tramadol and Penicillins  Home Medications   Current Outpatient Rx  Name  Route  Sig  Dispense  Refill  . albuterol (PROVENTIL HFA;VENTOLIN HFA) 108 (90 BASE) MCG/ACT inhaler   Inhalation   Inhale 2 puffs into the lungs every 4 (four) hours as needed for wheezing or shortness  of breath (cough).   1 Inhaler   1   . aspirin EC 81 MG tablet   Oral   Take 81 mg by mouth daily.         Marland Kitchen atorvastatin (LIPITOR) 10 MG tablet   Oral   Take 1 tablet (10 mg total) by mouth daily.   30 tablet   1   . HYDROcodone-acetaminophen (NORCO/VICODIN) 5-325 MG per tablet   Oral   Take 1 tablet by mouth every 4 (four) hours as needed for pain.   15 tablet   0   . levothyroxine (SYNTHROID, LEVOTHROID) 50 MCG tablet   Oral   Take 1 tablet (50 mcg total) by mouth daily.   30 tablet   2   . metoprolol succinate (TOPROL-XL) 25 MG 24 hr tablet   Oral   Take 1 tablet (25 mg total) by mouth daily.   30 tablet   1   . HYDROcodone-acetaminophen (NORCO) 5-325 MG per tablet   Oral   Take 1 tablet by mouth every 4 (four) hours as needed for pain.   20 tablet   0    BP 117/73  Pulse 88  Temp(Src) 98.4 F (36.9 C) (Oral)  Resp 18  SpO2 98% Physical Exam  Nursing note and vitals reviewed. Constitutional: She is  oriented to person, place, and time. She appears well-developed.  Obese  HENT:  Head: Normocephalic and atraumatic.  Eyes: Conjunctivae and EOM are normal. Pupils are equal, round, and reactive to light.  Neck: Normal range of motion and phonation normal. Neck supple.  Cardiovascular: Normal rate.   Pulmonary/Chest: Effort normal. She exhibits no tenderness.  Musculoskeletal:  Right knee with large effusion. Right knee is diffusely tender, and she resists flexion secondary to pain. There is normal lumbar range of motion.  Neurological: She is alert and oriented to person, place, and time. She exhibits normal muscle tone.  Skin: Skin is warm and dry.  Psychiatric: She has a normal mood and affect. Her behavior is normal. Judgment and thought content normal.    ED Course  Procedures (including critical care time)  Knee immobilizer ordered. \ Medications  HYDROcodone-acetaminophen (NORCO/VICODIN) 5-325 MG per tablet 2 tablet (2 tablets Oral Given 09/30/13 2152)      Labs Review Labs Reviewed - No data to display Imaging Review No results found.  EKG Interpretation   None       MDM   1. Knee effusion, right      Chronic knee pain, now, with effusion. Subacute arthroscopy procedure 3 weeks ago. Doubt knee infection. Doubt fracture. She is stable for discharge with outpatient management.   Nursing Notes Reviewed/ Care Coordinated, and agree without changes. Applicable Imaging Reviewed.  Interpretation of Laboratory Data incorporated into ED treatment   Plan: Home Medications- Norco; Home Treatments and Observation- immobilizer, and rest; return here if the recommended treatment, does not improve the symptoms; Recommended follow up- orthopedic follow up as a possible      Flint Melter, MD 09/30/13 2215

## 2013-09-30 NOTE — ED Notes (Addendum)
Pt states she had right knee arthroscopy 1 month ago, states she still has knee pain. Pain has progressively gotten worse x4 days. reports waking up this morning with swollen knee. States she heard knee "pop" today.  Also c/o lower back pain.  Pt states she cannot have tramadol - last time she received it in hospital, made her vomit

## 2013-10-05 ENCOUNTER — Other Ambulatory Visit: Payer: Self-pay | Admitting: Family Medicine

## 2013-10-05 ENCOUNTER — Telehealth: Payer: Self-pay

## 2013-10-05 MED ORDER — ALBUTEROL SULFATE HFA 108 (90 BASE) MCG/ACT IN AERS: 2.0000 | INHALATION_SPRAY | RESPIRATORY_TRACT | Status: DC | PRN

## 2013-10-05 NOTE — Telephone Encounter (Signed)
Refilled

## 2013-10-05 NOTE — Telephone Encounter (Signed)
Will forward to MD. Lurene Robley,CMA  

## 2013-10-05 NOTE — Telephone Encounter (Signed)
Refill request for an albuterol inhaler.

## 2013-10-25 ENCOUNTER — Emergency Department (HOSPITAL_COMMUNITY): Payer: Medicaid Other

## 2013-10-25 ENCOUNTER — Emergency Department (HOSPITAL_COMMUNITY)
Admission: EM | Admit: 2013-10-25 | Discharge: 2013-10-25 | Disposition: A | Discharge status: Home / Self Care | Payer: Medicaid Other | Attending: Emergency Medicine | Admitting: Emergency Medicine

## 2013-10-25 ENCOUNTER — Encounter (HOSPITAL_COMMUNITY): Payer: Self-pay | Admitting: Emergency Medicine

## 2013-10-25 DIAGNOSIS — R0602 Shortness of breath: Secondary | ICD-10-CM | POA: Insufficient documentation

## 2013-10-25 DIAGNOSIS — J4489 Other specified chronic obstructive pulmonary disease: Secondary | ICD-10-CM | POA: Insufficient documentation

## 2013-10-25 DIAGNOSIS — F411 Generalized anxiety disorder: Secondary | ICD-10-CM | POA: Insufficient documentation

## 2013-10-25 DIAGNOSIS — R51 Headache: Secondary | ICD-10-CM | POA: Insufficient documentation

## 2013-10-25 DIAGNOSIS — R1031 Right lower quadrant pain: Secondary | ICD-10-CM | POA: Insufficient documentation

## 2013-10-25 DIAGNOSIS — M1990 Unspecified osteoarthritis, unspecified site: Secondary | ICD-10-CM | POA: Insufficient documentation

## 2013-10-25 DIAGNOSIS — I5032 Chronic diastolic (congestive) heart failure: Secondary | ICD-10-CM | POA: Insufficient documentation

## 2013-10-25 DIAGNOSIS — Z8679 Personal history of other diseases of the circulatory system: Secondary | ICD-10-CM | POA: Insufficient documentation

## 2013-10-25 DIAGNOSIS — H538 Other visual disturbances: Secondary | ICD-10-CM | POA: Insufficient documentation

## 2013-10-25 DIAGNOSIS — Z559 Problems related to education and literacy, unspecified: Secondary | ICD-10-CM | POA: Insufficient documentation

## 2013-10-25 DIAGNOSIS — K219 Gastro-esophageal reflux disease without esophagitis: Secondary | ICD-10-CM | POA: Insufficient documentation

## 2013-10-25 DIAGNOSIS — Z8709 Personal history of other diseases of the respiratory system: Secondary | ICD-10-CM | POA: Insufficient documentation

## 2013-10-25 DIAGNOSIS — E039 Hypothyroidism, unspecified: Secondary | ICD-10-CM | POA: Insufficient documentation

## 2013-10-25 DIAGNOSIS — Z888 Allergy status to other drugs, medicaments and biological substances status: Secondary | ICD-10-CM | POA: Insufficient documentation

## 2013-10-25 DIAGNOSIS — J449 Chronic obstructive pulmonary disease, unspecified: Secondary | ICD-10-CM | POA: Insufficient documentation

## 2013-10-25 DIAGNOSIS — Z885 Allergy status to narcotic agent status: Secondary | ICD-10-CM | POA: Insufficient documentation

## 2013-10-25 DIAGNOSIS — R110 Nausea: Secondary | ICD-10-CM | POA: Insufficient documentation

## 2013-10-25 DIAGNOSIS — R109 Unspecified abdominal pain: Secondary | ICD-10-CM

## 2013-10-25 DIAGNOSIS — E669 Obesity, unspecified: Secondary | ICD-10-CM | POA: Insufficient documentation

## 2013-10-25 DIAGNOSIS — J45909 Unspecified asthma, uncomplicated: Secondary | ICD-10-CM | POA: Insufficient documentation

## 2013-10-25 DIAGNOSIS — I252 Old myocardial infarction: Secondary | ICD-10-CM | POA: Insufficient documentation

## 2013-10-25 DIAGNOSIS — Z8669 Personal history of other diseases of the nervous system and sense organs: Secondary | ICD-10-CM | POA: Insufficient documentation

## 2013-10-25 DIAGNOSIS — I10 Essential (primary) hypertension: Secondary | ICD-10-CM | POA: Insufficient documentation

## 2013-10-25 DIAGNOSIS — Z87891 Personal history of nicotine dependence: Secondary | ICD-10-CM | POA: Insufficient documentation

## 2013-10-25 DIAGNOSIS — Z880 Allergy status to penicillin: Secondary | ICD-10-CM | POA: Insufficient documentation

## 2013-10-25 LAB — COMPREHENSIVE METABOLIC PANEL
ALT: 8 U/L (ref 0–35)
AST: 15 U/L (ref 0–37)
Albumin: 3.7 g/dL (ref 3.5–5.2)
Alkaline Phosphatase: 85 U/L (ref 39–117)
BUN: 10 mg/dL (ref 6–23)
CO2: 24 mEq/L (ref 19–32)
Calcium: 8.9 mg/dL (ref 8.4–10.5)
Chloride: 104 mEq/L (ref 96–112)
Creatinine, Ser: 0.88 mg/dL (ref 0.50–1.10)
GFR calc Af Amer: 84 mL/min — ABNORMAL LOW (ref >90)
GFR calc non Af Amer: 72 mL/min — ABNORMAL LOW (ref >90)
Glucose, Bld: 85 mg/dL (ref 70–99)
Potassium: 3.9 mEq/L (ref 3.5–5.1)
Sodium: 138 mEq/L (ref 135–145)
Total Bilirubin: 0.3 mg/dL (ref 0.3–1.2)
Total Protein: 6.7 g/dL (ref 6.0–8.3)

## 2013-10-25 LAB — CBC WITH DIFFERENTIAL/PLATELET
Basophils Absolute: 0.1 10*3/uL (ref 0.0–0.1)
Basophils Relative: 1 % (ref 0–1)
Eosinophils Absolute: 0.3 10*3/uL (ref 0.0–0.7)
Eosinophils Relative: 5 % (ref 0–5)
HCT: 38.6 % (ref 36.0–46.0)
Hemoglobin: 12.9 g/dL (ref 12.0–15.0)
Lymphocytes Relative: 38 % (ref 12–46)
Lymphs Abs: 2.5 10*3/uL (ref 0.7–4.0)
MCH: 28.4 pg (ref 26.0–34.0)
MCHC: 33.4 g/dL (ref 30.0–36.0)
MCV: 85 fL (ref 78.0–100.0)
Monocytes Absolute: 0.5 10*3/uL (ref 0.1–1.0)
Monocytes Relative: 7 % (ref 3–12)
Neutro Abs: 3.2 10*3/uL (ref 1.7–7.7)
Neutrophils Relative %: 50 % (ref 43–77)
Platelets: 142 10*3/uL — ABNORMAL LOW (ref 150–400)
RBC: 4.54 MIL/uL (ref 3.87–5.11)
RDW: 14.8 % (ref 11.5–15.5)
WBC: 6.5 10*3/uL (ref 4.0–10.5)

## 2013-10-25 LAB — URINALYSIS, ROUTINE W REFLEX MICROSCOPIC
Bilirubin Urine: NEGATIVE
Glucose, UA: NEGATIVE mg/dL
Hgb urine dipstick: NEGATIVE
Ketones, ur: NEGATIVE mg/dL
Leukocytes, UA: NEGATIVE
Nitrite: NEGATIVE
Protein, ur: NEGATIVE mg/dL
Specific Gravity, Urine: 1.004 — ABNORMAL LOW (ref 1.005–1.030)
Urobilinogen, UA: 1 mg/dL (ref 0.0–1.0)
pH: 7.5 (ref 5.0–8.0)

## 2013-10-25 LAB — LIPASE, BLOOD: Lipase: 38 U/L (ref 11–59)

## 2013-10-25 MED ORDER — MORPHINE SULFATE 4 MG/ML IJ SOLN: 4.0000 mg | Freq: Once | INTRAMUSCULAR | Status: DC

## 2013-10-25 MED ORDER — HYDROCODONE-ACETAMINOPHEN 5-325 MG PO TABS: 1.0000 | ORAL_TABLET | Freq: Four times a day (QID) | ORAL | Status: DC | PRN

## 2013-10-25 MED ORDER — MORPHINE SULFATE 4 MG/ML IJ SOLN
4.0000 mg | Freq: Once | INTRAMUSCULAR | Status: AC
Start: 2013-10-25 — End: 2013-10-25
  Administered 2013-10-25: 4 mg via INTRAVENOUS
  Filled 2013-10-25: qty 1

## 2013-10-25 MED ORDER — OXYCODONE-ACETAMINOPHEN 5-325 MG PO TABS
1.0000 | ORAL_TABLET | Freq: Once | ORAL | Status: AC
  Administered 2013-10-25: 1 via ORAL
  Filled 2013-10-25: qty 1

## 2013-10-25 MED ORDER — IOHEXOL 300 MG/ML  SOLN
100.0000 mL | Freq: Once | INTRAMUSCULAR | Status: AC | PRN
  Administered 2013-10-25: 100 mL via INTRAVENOUS

## 2013-10-25 MED ORDER — IOHEXOL 300 MG/ML  SOLN: 25.0000 mL | INTRAMUSCULAR | Status: AC

## 2013-10-25 MED ORDER — ONDANSETRON HCL 4 MG/2ML IJ SOLN
4.0000 mg | Freq: Once | INTRAMUSCULAR | Status: AC
  Administered 2013-10-25: 4 mg via INTRAVENOUS
  Filled 2013-10-25: qty 2

## 2013-10-25 NOTE — ED Notes (Signed)
Patient transported to Pod A-11 from CT.

## 2013-10-25 NOTE — ED Notes (Signed)
Family at bedside. 

## 2013-10-25 NOTE — ED Notes (Signed)
Patient is alert and orientedx4.  Patient was explained discharge instructions and they understood them with no questions.  The patient's brother in law, Annett Fabian is taking the patient home.

## 2013-10-25 NOTE — ED Provider Notes (Signed)
CSN: [id number]     Arrival date & time 10/25/13  1748 History   First MD Initiated Contact with Patient 10/25/13 1841     Chief Complaint  Patient presents with  . Chest Pain    HPI  [name] is a 57 yo female who complains of intermittent R chest pain, RLQ pain, and headache. None of the pains are related but all started today. Her abdominal pain is the worse, 10/10, sharp and shooting. Pain lasts for about 5 mins then goes away, no aggravating or relieving factors. NO constipation, diarrhea, or vomiting, No stool or urinary changes.   R chest pain is also intermittent and sharp, not brought on by activity or relieved with rest. No associated  Diaphoresis, palpitations, or weakness. She has had SOB and nausea today but does not seem to be related to either pain and lasts for only a few seconds. Her head has also been hurting and is throbbing, goes away and comes back, associated with blurry vision at times. NO photophobia or phonophobia. No dizziness or syncope. No numbness or tingling in extremities.  Past Medical History  Diagnosis Date  . Chest pain     a. s/p reported MI in 1997;  b. 09/2012 Cath: nl cors, nl LV ([place]);  c. 05/2012 Non-ischemic myoview.  . Hypertension   . Arthritis   . Problem with literacy   . Shortness of breath   . Anxiety   . Bronchitis   . Chronic headaches   . Obesity   . COPD (chronic obstructive pulmonary disease)   . Chronic diastolic CHF (congestive heart failure)     a. 12/2011 Echo: nl ef, Gr 1 DD;  b. 11/2012 Echo: EF 45-50%, no significant valvular abnormalities.  Marland Kitchen GERD (gastroesophageal reflux disease)   . Myocardial infarction 1997    self reported  . Hypothyroidism   . Asthma   . Chronic chest pain    Past Surgical History  Procedure Laterality Date  . Ectopic pregnancy surgery      right  . Nm myoview ltd  June 2013    no reversible ischemia  . Cardiac catheterization  October 2012    No coronary artery disease  .  Cardiac catheterization  2012    normal coronary arteries  . Nm myoview ltd  2012, 2013    normal  . 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 Knee arthroscopy  08/13/13    right   Family History  Problem Relation Age of Onset  . Cancer Mother 53    lung ca  . Diabetes Mother   . Anxiety disorder Sister   . Cancer Brother 60    lung  . Colon cancer Neg Hx   . Pancreatic cancer Neg Hx   . Rectal cancer Neg Hx   . Stomach cancer Neg Hx    History  Substance Use Topics  . Smoking status: Former Smoker -- 1.00 packs/day for 3 years    Quit date: 12/03/2010  . Smokeless tobacco: Never Used  . Alcohol Use: No   OB History   Grav Para Term Preterm Abortions TAB SAB Ect Mult Living                 Review of Systems All other systems negative except as documented in the HPI. All pertinent positives and negatives as reviewed in the HPI. Allergies  Darvocet; Tramadol; Celebrex; Naproxen; and Penicillins  Home Medications  No current outpatient prescriptions on file. BP 122/44  Pulse 80  Temp(Src) 97.6 F (36.4 C) (Oral)  Resp 18  SpO2 99% Physical Exam  Constitutional: She is oriented to person, place, and time. She appears well-developed and well-nourished. No distress.  HENT:  Head: Normocephalic and atraumatic.  Eyes: Conjunctivae and EOM are normal. Pupils are equal, round, and reactive to light.  Neck: Trachea normal. Carotid bruit is not present. No mass present.  Cardiovascular: Normal rate, regular rhythm, S1 normal and S2 normal.   Pulmonary/Chest: Effort normal and breath sounds normal. She exhibits tenderness. She exhibits no mass and no laceration.    Abdominal: Soft. Normal appearance and bowel sounds are normal. There is no hepatosplenomegaly. There is tenderness in the right lower quadrant. There is no rigidity, no rebound, no guarding, no CVA tenderness, no tenderness at McBurney's point and negative Murphy's sign.    Lymphadenopathy:    She has no cervical adenopathy.  Neurological: She is alert and oriented to person, place, and time. She has normal strength. No cranial nerve deficit or sensory deficit. She displays a negative Romberg sign.  Reflex Scores:      Tricep reflexes are 2+ on the right side and 2+ on the left side.      Bicep reflexes are 2+ on the right side and 2+ on the left side.      Patellar reflexes are 2+ on the right side and 2+ on the left side. Skin: Skin is warm and dry.  Psychiatric: She has a normal mood and affect.    ED Course  Procedures (including critical care time) Labs Review Labs Reviewed  CBC WITH DIFFERENTIAL  COMPREHENSIVE METABOLIC PANEL  LIPASE, BLOOD  URINALYSIS, ROUTINE W REFLEX MICROSCOPIC   Patient is not having any pain on my exam after IV medications.  Patient's testing was reviewed and she is explained the results.  The patient is advised followup with her primary care, Dr. she's told to return here as needed.  The patient's chest pain is right-sided and very atypical for cardiac disease is only painful on palpation patient has been seen in the emergency room multiple times for similar complaint  Carlyle Dolly, PA-C 10/26/13 0138  Carlyle Dolly, PA-C 10/26/13 0139

## 2013-10-25 NOTE — ED Notes (Signed)
Pt from home c/o right cp and abd pain with abd pain. Pt states some nausea, NSD

## 2013-10-25 NOTE — ED Notes (Signed)
Patient transported to CT 

## 2013-10-26 ENCOUNTER — Telehealth: Payer: Self-pay | Admitting: Family Medicine

## 2013-10-26 NOTE — Telephone Encounter (Signed)
Pt called because the inhaler that the doctor called in is not strong enough and would like another called in. JW

## 2013-10-26 NOTE — Telephone Encounter (Signed)
Number in chart has been disconnected. Fleeger, Maryjo Rochester

## 2013-10-26 NOTE — Telephone Encounter (Signed)
Which inhaler is that,she does not have any on her list and she had been on Albuterol and Ipratropium,patient need to follow up with me for her COPD.

## 2013-10-28 NOTE — ED Provider Notes (Signed)
Medical screening examination/treatment/procedure(s) were performed by non-physician practitioner and as supervising physician I was immediately available for consultation/collaboration.  EKG Interpretation     Ventricular Rate:  79 PR Interval:  151 QRS Duration: 94 QT Interval:  406 QTC Calculation: 465 R Axis:   17 Text Interpretation:  Sinus rhythm             Jayon Matton R. Rubin Payor, MD 10/28/13 [phone]

## 2013-11-02 ENCOUNTER — Encounter: Payer: Self-pay | Admitting: Family Medicine

## 2013-11-02 ENCOUNTER — Ambulatory Visit (INDEPENDENT_AMBULATORY_CARE_PROVIDER_SITE_OTHER): Payer: Medicaid Other | Admitting: Family Medicine

## 2013-11-02 VITALS — BP 134/85 | HR 80 | Temp 98.4°F | Ht 68.0 in | Wt 222.0 lb

## 2013-11-02 DIAGNOSIS — E039 Hypothyroidism, unspecified: Secondary | ICD-10-CM

## 2013-11-02 DIAGNOSIS — R109 Unspecified abdominal pain: Secondary | ICD-10-CM

## 2013-11-02 DIAGNOSIS — Z0000 Encounter for general adult medical examination without abnormal findings: Secondary | ICD-10-CM | POA: Insufficient documentation

## 2013-11-02 DIAGNOSIS — J449 Chronic obstructive pulmonary disease, unspecified: Secondary | ICD-10-CM

## 2013-11-02 DIAGNOSIS — Z1331 Encounter for screening for depression: Secondary | ICD-10-CM | POA: Insufficient documentation

## 2013-11-02 LAB — POCT URINALYSIS DIPSTICK
Blood, UA: NEGATIVE
Glucose, UA: NEGATIVE
Ketones, UA: NEGATIVE
Spec Grav, UA: <=1.005
Urobilinogen, UA: 0.2

## 2013-11-02 MED ORDER — LEVOTHYROXINE SODIUM 50 MCG PO TABS: 50.0000 ug | ORAL_TABLET | Freq: Every day | ORAL | Status: DC

## 2013-11-02 MED ORDER — BECLOMETHASONE DIPROPIONATE 80 MCG/ACT IN AERS: 1.0000 | INHALATION_SPRAY | Freq: Two times a day (BID) | RESPIRATORY_TRACT | Status: DC

## 2013-11-02 NOTE — Assessment & Plan Note (Signed)
She declined flu shot today. Mammogram recommended and instructed on how to schedule appointment.

## 2013-11-02 NOTE — Assessment & Plan Note (Signed)
Urine negative for blood,no leukocyte or nitrite. Kidney stone unlikely. UTI/Pyelonephritis unlikely. Etiology of pain unclear at this time. Motrin prn pain. If persistent will get CT scan to assess her kidney. Patient agreed with plan.

## 2013-11-02 NOTE — Assessment & Plan Note (Signed)
Negative.

## 2013-11-02 NOTE — Progress Notes (Signed)
Subjective:     Patient ID: [name], female   DOB: 12/30/1955, 57 y.o.   MRN: [id number]  HPI Left Flank Pain: Few days ago, sharp pain about 10/10. No hx of Kidney stone,no urinary symptom. Feels nauseous,no other GI symptoms.No fever. No trauma. Hypothyroidism: She has been out of her medication for more than 3 months and did not get them refilled due to financial issue,she now has the money to get medication and plans to get synthroid filled today. Denies any other concern except for weight gain. COPD: Coughing more with SOB worse at night associated with wheezing for the last few weeks, using albuterol more often. She denies any chest tightness, she feels her albuterol is not working as well. Depression: Need screening. Denies suicidal ideation. HM: Has not had mammogram.  No current outpatient prescriptions on file prior to visit.   No current facility-administered medications on file prior to visit.   Past Medical History  Diagnosis Date  . Chest pain     a. s/p reported MI in 1997;  b. 09/2012 Cath: nl cors, nl LV ([place]);  c. 05/2012 Non-ischemic myoview.  . Hypertension   . Arthritis   . Problem with literacy   . Shortness of breath   . Anxiety   . Bronchitis   . Chronic headaches   . Obesity   . COPD (chronic obstructive pulmonary disease)   . Chronic diastolic CHF (congestive heart failure)     a. 12/2011 Echo: nl ef, Gr 1 DD;  b. 11/2012 Echo: EF 45-50%, no significant valvular abnormalities.  Marland Kitchen GERD (gastroesophageal reflux disease)   . Myocardial infarction 1997    self reported  . Hypothyroidism   . Asthma   . Chronic chest pain        Review of Systems  Constitutional: Positive for unexpected weight change. Negative for fatigue.  Respiratory: Positive for cough, shortness of breath and wheezing.   Cardiovascular: Negative.   Gastrointestinal: Negative.   Genitourinary: Positive for flank pain. Negative for dysuria, frequency, difficulty urinating  and menstrual problem.  All other systems reviewed and are negative.       Filed Vitals:   11/02/13 1435  BP: 134/85  Pulse: 80  Temp: 98.4 F (36.9 C)  TempSrc: Oral  Height: 5\' 8"  (1.727 m)  Weight: 222 lb (100.699 kg)  PF: 200 L/min    Objective:   Physical Exam  Nursing note and vitals reviewed. Constitutional: She appears well-developed. No distress.  Cardiovascular: Normal rate, regular rhythm and normal heart sounds.   No murmur heard. Pulmonary/Chest: Effort normal and breath sounds normal. No respiratory distress. She has no wheezes.  Abdominal: Soft. Bowel sounds are normal. She exhibits no distension and no mass. There is no tenderness. There is no CVA tenderness.  Musculoskeletal: Normal range of motion.       Assessment:     Flank pain: Left Hypothyroidism COPD Depression screening  Health maintenance    Plan:     Check problem list

## 2013-11-02 NOTE — Assessment & Plan Note (Signed)
Non-compliant with her medication. No need to obtain TSH at this time. She plan to pick up her Synthroid today and more refills were given today. F/U in 2 months for TSH check.

## 2013-11-02 NOTE — Assessment & Plan Note (Signed)
Currently not doing well on albuterol prn. Peak flow of 200L/Min which is about 50% of her best. I added Qvar to her regimen. I will reassess her at next visit.

## 2013-11-03 ENCOUNTER — Telehealth: Payer: Self-pay | Admitting: Family Medicine

## 2013-11-03 NOTE — Telephone Encounter (Signed)
Left message to call about urine test result which was normal. Unlikely to have kidney stone or kidney infection. F/U soon if still having flank pain.

## 2013-11-03 NOTE — Telephone Encounter (Signed)
LVM for pt to call back.  Please given message below from Dr. Lum Babe. Jazmin Hartsell,CMA

## 2013-11-05 ENCOUNTER — Other Ambulatory Visit: Payer: Self-pay | Admitting: Family Medicine

## 2013-11-05 DIAGNOSIS — R922 Inconclusive mammogram: Secondary | ICD-10-CM

## 2013-11-18 ENCOUNTER — Emergency Department (HOSPITAL_COMMUNITY)
Admission: EM | Admit: 2013-11-18 | Discharge: 2013-11-19 | Disposition: A | Discharge status: Home / Self Care | Payer: Medicaid Other | Attending: Emergency Medicine | Admitting: Emergency Medicine

## 2013-11-18 ENCOUNTER — Other Ambulatory Visit: Payer: Self-pay

## 2013-11-18 ENCOUNTER — Encounter (HOSPITAL_COMMUNITY): Payer: Self-pay | Admitting: Emergency Medicine

## 2013-11-18 ENCOUNTER — Emergency Department (HOSPITAL_COMMUNITY): Payer: Medicaid Other

## 2013-11-18 DIAGNOSIS — I5032 Chronic diastolic (congestive) heart failure: Secondary | ICD-10-CM | POA: Insufficient documentation

## 2013-11-18 DIAGNOSIS — M129 Arthropathy, unspecified: Secondary | ICD-10-CM | POA: Insufficient documentation

## 2013-11-18 DIAGNOSIS — G8929 Other chronic pain: Secondary | ICD-10-CM | POA: Insufficient documentation

## 2013-11-18 DIAGNOSIS — E039 Hypothyroidism, unspecified: Secondary | ICD-10-CM | POA: Insufficient documentation

## 2013-11-18 DIAGNOSIS — Z880 Allergy status to penicillin: Secondary | ICD-10-CM | POA: Insufficient documentation

## 2013-11-18 DIAGNOSIS — J4489 Other specified chronic obstructive pulmonary disease: Secondary | ICD-10-CM | POA: Insufficient documentation

## 2013-11-18 DIAGNOSIS — R0789 Other chest pain: Secondary | ICD-10-CM

## 2013-11-18 DIAGNOSIS — Z7982 Long term (current) use of aspirin: Secondary | ICD-10-CM | POA: Insufficient documentation

## 2013-11-18 DIAGNOSIS — I252 Old myocardial infarction: Secondary | ICD-10-CM | POA: Insufficient documentation

## 2013-11-18 DIAGNOSIS — Z87891 Personal history of nicotine dependence: Secondary | ICD-10-CM | POA: Insufficient documentation

## 2013-11-18 DIAGNOSIS — J449 Chronic obstructive pulmonary disease, unspecified: Secondary | ICD-10-CM | POA: Insufficient documentation

## 2013-11-18 DIAGNOSIS — I10 Essential (primary) hypertension: Secondary | ICD-10-CM | POA: Insufficient documentation

## 2013-11-18 DIAGNOSIS — R071 Chest pain on breathing: Secondary | ICD-10-CM | POA: Insufficient documentation

## 2013-11-18 DIAGNOSIS — Z8719 Personal history of other diseases of the digestive system: Secondary | ICD-10-CM | POA: Insufficient documentation

## 2013-11-18 DIAGNOSIS — E669 Obesity, unspecified: Secondary | ICD-10-CM | POA: Insufficient documentation

## 2013-11-18 DIAGNOSIS — Z8659 Personal history of other mental and behavioral disorders: Secondary | ICD-10-CM | POA: Insufficient documentation

## 2013-11-18 DIAGNOSIS — IMO0002 Reserved for concepts with insufficient information to code with codable children: Secondary | ICD-10-CM | POA: Insufficient documentation

## 2013-11-18 DIAGNOSIS — N390 Urinary tract infection, site not specified: Secondary | ICD-10-CM

## 2013-11-18 DIAGNOSIS — Z79899 Other long term (current) drug therapy: Secondary | ICD-10-CM | POA: Insufficient documentation

## 2013-11-18 MED ORDER — HYDROCODONE-ACETAMINOPHEN 5-325 MG PO TABS
1.0000 | ORAL_TABLET | Freq: Once | ORAL | Status: AC
  Administered 2013-11-19: 1 via ORAL
  Filled 2013-11-18: qty 1

## 2013-11-18 NOTE — ED Notes (Signed)
Urine collected and held at triage station

## 2013-11-18 NOTE — ED Notes (Signed)
Patient is alert and oriented x3.  She is complaining of a cough that started this morning.  She currently rates her pain 10 of 10.  V/S stable per EMS

## 2013-11-19 LAB — URINALYSIS, ROUTINE W REFLEX MICROSCOPIC
Bilirubin Urine: NEGATIVE
Glucose, UA: NEGATIVE mg/dL
Hgb urine dipstick: NEGATIVE
Ketones, ur: NEGATIVE mg/dL
Nitrite: POSITIVE — AB
Protein, ur: NEGATIVE mg/dL
Specific Gravity, Urine: 1.018 (ref 1.005–1.030)
Urobilinogen, UA: 1 mg/dL (ref 0.0–1.0)
pH: 6.5 (ref 5.0–8.0)

## 2013-11-19 LAB — URINE MICROSCOPIC-ADD ON

## 2013-11-19 MED ORDER — HYDROCODONE-ACETAMINOPHEN 5-325 MG PO TABS: 1.0000 | ORAL_TABLET | Freq: Four times a day (QID) | ORAL | Status: DC | PRN

## 2013-11-19 MED ORDER — NITROFURANTOIN MONOHYD MACRO 100 MG PO CAPS: 100.0000 mg | ORAL_CAPSULE | Freq: Two times a day (BID) | ORAL | Status: DC

## 2013-11-19 NOTE — ED Provider Notes (Signed)
CSN: [id number]     Arrival date & time 11/18/13  2052 History   First MD Initiated Contact with Patient 11/18/13 2314     Chief Complaint  Patient presents with  . Cough   (Consider location/radiation/quality/duration/timing/severity/associated sxs/prior Treatment) HPI Patient presents emergency department with right-sided chest pain is worse with coughing.  Patient, states the pain, increased last night.  Patient, states, that she did not take anything prior to arrival.  Patient, states, that she has 10 out of 10 pain.  Patient denies nausea, vomiting, abdominal pain, headache, blurred vision, weakness, numbness, dizziness, fever, or syncope, or neck pain.  The patient, states, that she does have some discomfort with urination.  Patient says that palpation and deep breathing, make her pain, worse Past Medical History  Diagnosis Date  . Chest pain     a. s/p reported MI in 1997;  b. 09/2012 Cath: nl cors, nl LV ([place]);  c. 05/2012 Non-ischemic myoview.  . Hypertension   . Arthritis   . Problem with literacy   . Shortness of breath   . Anxiety   . Bronchitis   . Chronic headaches   . Obesity   . COPD (chronic obstructive pulmonary disease)   . Chronic diastolic CHF (congestive heart failure)     a. 12/2011 Echo: nl ef, Gr 1 DD;  b. 11/2012 Echo: EF 45-50%, no significant valvular abnormalities.  Marland Kitchen GERD (gastroesophageal reflux disease)   . Myocardial infarction 1997    self reported  . Hypothyroidism   . Asthma   . Chronic chest pain    Past Surgical History  Procedure Laterality Date  . Ectopic pregnancy surgery      right  . Nm myoview ltd  June 2013    no reversible ischemia  . Cardiac catheterization  October 2012    No coronary artery disease  . Cardiac catheterization  2012    normal coronary arteries  . Nm myoview ltd  2012, 2013    normal  . 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 Knee arthroscopy  08/13/13    right   Family  History  Problem Relation Age of Onset  . Cancer Mother 49    lung ca  . Diabetes Mother   . Anxiety disorder Sister   . Cancer Brother 60    lung  . Colon cancer Neg Hx   . Pancreatic cancer Neg Hx   . Rectal cancer Neg Hx   . Stomach cancer Neg Hx    History  Substance Use Topics  . Smoking status: Former Smoker -- 1.00 packs/day for 3 years    Quit date: 12/03/2010  . Smokeless tobacco: Never Used  . Alcohol Use: No   OB History   Grav Para Term Preterm Abortions TAB SAB Ect Mult Living                 Review of Systems All other systems negative except as documented in the HPI. All pertinent positives and negatives as reviewed in the HPI. Allergies  Darvocet; Tramadol; Celebrex; Naproxen; and Penicillins  Home Medications   Current Outpatient Rx  Name  Route  Sig  Dispense  Refill  . albuterol (PROVENTIL HFA;VENTOLIN HFA) 108 (90 BASE) MCG/ACT inhaler   Inhalation   Inhale 2 puffs into the lungs every 6 (six) hours as needed for wheezing or shortness of breath.         Marland Kitchen  aspirin EC 81 MG tablet   Oral   Take 81 mg by mouth daily.         . beclomethasone (QVAR) 80 MCG/ACT inhaler   Inhalation   Inhale 1 puff into the lungs 2 (two) times daily.   1 Inhaler   5   . levothyroxine (SYNTHROID, LEVOTHROID) 50 MCG tablet   Oral   Take 1 tablet (50 mcg total) by mouth daily before breakfast.   30 tablet   4    BP 121/78  Pulse 81  Temp(Src) 97.9 F (36.6 C) (Oral)  Resp 14  SpO2 99% Physical Exam  Nursing note and vitals reviewed. Constitutional: She is oriented to person, place, and time. She appears well-developed and well-nourished. No distress.  Cardiovascular: Normal rate, regular rhythm and normal heart sounds.  Exam reveals no gallop and no friction rub.   No murmur heard. Pulmonary/Chest: Effort normal and breath sounds normal. She exhibits tenderness.    Neurological: She is alert and oriented to person, place, and time.  Skin: Skin is  warm and dry. No rash noted. No erythema.    ED Course  Procedures (including critical care time) Labs Review Labs Reviewed  URINALYSIS, ROUTINE W REFLEX MICROSCOPIC - Abnormal; Notable for the following:    APPearance CLOUDY (*)    Nitrite POSITIVE (*)    Leukocytes, UA MODERATE (*)    All other components within normal limits  URINE MICROSCOPIC-ADD ON - Abnormal; Notable for the following:    Squamous Epithelial / LPF FEW (*)    Bacteria, UA MANY (*)    All other components within normal limits  URINE CULTURE   Imaging Review Dg Chest 2 View  11/19/2013   CLINICAL DATA:  Short of breath.  Flu symptoms for 48 hr.  EXAM: CHEST  2 VIEW  COMPARISON:  07/15/2013  FINDINGS: Numerous leads and wires project over the chest. Midline trachea. Normal heart size and mediastinal contours. No pleural effusion or pneumothorax. Clear lungs.  IMPRESSION: No acute cardiopulmonary disease.   Electronically Signed   By: Jeronimo Greaves M.D.   On: 11/19/2013 00:38    Patient be treated for chest wall pain, and her UTI.  The patient is advised return here as needed.  Also advised her to followup with her primary care doctor.  She is told to use ice and heat on her chest wall.  Patient has right-sided chest pain, with no other symptoms.  Patient is PERC negative  Carlyle Dolly, PA-C 11/19/13 0105

## 2013-11-21 LAB — URINE CULTURE: Colony Count: >=100000

## 2013-11-21 NOTE — ED Provider Notes (Signed)
Medical screening examination/treatment/procedure(s) were performed by non-physician practitioner and as supervising physician I was immediately available for consultation/collaboration.  EKG Interpretation   None        Jacksen Isip R. Audy Dauphine, MD 11/21/13 1048 

## 2013-11-23 ENCOUNTER — Other Ambulatory Visit: Payer: Medicaid Other

## 2013-11-25 ENCOUNTER — Encounter (HOSPITAL_COMMUNITY): Payer: Self-pay | Admitting: Emergency Medicine

## 2013-11-25 ENCOUNTER — Emergency Department (HOSPITAL_COMMUNITY)
Admission: EM | Admit: 2013-11-25 | Discharge: 2013-11-25 | Disposition: A | Discharge status: Home / Self Care | Payer: Medicaid Other | Attending: Emergency Medicine | Admitting: Emergency Medicine

## 2013-11-25 ENCOUNTER — Emergency Department (HOSPITAL_COMMUNITY): Payer: Medicaid Other

## 2013-11-25 DIAGNOSIS — S3992XA Unspecified injury of lower back, initial encounter: Secondary | ICD-10-CM

## 2013-11-25 DIAGNOSIS — W19XXXA Unspecified fall, initial encounter: Secondary | ICD-10-CM

## 2013-11-25 DIAGNOSIS — G8929 Other chronic pain: Secondary | ICD-10-CM | POA: Insufficient documentation

## 2013-11-25 DIAGNOSIS — J4489 Other specified chronic obstructive pulmonary disease: Secondary | ICD-10-CM | POA: Insufficient documentation

## 2013-11-25 DIAGNOSIS — Y9389 Activity, other specified: Secondary | ICD-10-CM | POA: Insufficient documentation

## 2013-11-25 DIAGNOSIS — IMO0002 Reserved for concepts with insufficient information to code with codable children: Secondary | ICD-10-CM | POA: Insufficient documentation

## 2013-11-25 DIAGNOSIS — S8990XA Unspecified injury of unspecified lower leg, initial encounter: Secondary | ICD-10-CM | POA: Insufficient documentation

## 2013-11-25 DIAGNOSIS — W108XXA Fall (on) (from) other stairs and steps, initial encounter: Secondary | ICD-10-CM | POA: Insufficient documentation

## 2013-11-25 DIAGNOSIS — S8991XA Unspecified injury of right lower leg, initial encounter: Secondary | ICD-10-CM

## 2013-11-25 DIAGNOSIS — Z95818 Presence of other cardiac implants and grafts: Secondary | ICD-10-CM | POA: Insufficient documentation

## 2013-11-25 DIAGNOSIS — Y929 Unspecified place or not applicable: Secondary | ICD-10-CM | POA: Insufficient documentation

## 2013-11-25 DIAGNOSIS — E039 Hypothyroidism, unspecified: Secondary | ICD-10-CM | POA: Insufficient documentation

## 2013-11-25 DIAGNOSIS — J449 Chronic obstructive pulmonary disease, unspecified: Secondary | ICD-10-CM | POA: Insufficient documentation

## 2013-11-25 DIAGNOSIS — Z8659 Personal history of other mental and behavioral disorders: Secondary | ICD-10-CM | POA: Insufficient documentation

## 2013-11-25 DIAGNOSIS — I252 Old myocardial infarction: Secondary | ICD-10-CM | POA: Insufficient documentation

## 2013-11-25 DIAGNOSIS — M129 Arthropathy, unspecified: Secondary | ICD-10-CM | POA: Insufficient documentation

## 2013-11-25 DIAGNOSIS — Z8719 Personal history of other diseases of the digestive system: Secondary | ICD-10-CM | POA: Insufficient documentation

## 2013-11-25 DIAGNOSIS — I5032 Chronic diastolic (congestive) heart failure: Secondary | ICD-10-CM | POA: Insufficient documentation

## 2013-11-25 DIAGNOSIS — Z7982 Long term (current) use of aspirin: Secondary | ICD-10-CM | POA: Insufficient documentation

## 2013-11-25 DIAGNOSIS — Z79899 Other long term (current) drug therapy: Secondary | ICD-10-CM | POA: Insufficient documentation

## 2013-11-25 DIAGNOSIS — E669 Obesity, unspecified: Secondary | ICD-10-CM | POA: Insufficient documentation

## 2013-11-25 DIAGNOSIS — Z880 Allergy status to penicillin: Secondary | ICD-10-CM | POA: Insufficient documentation

## 2013-11-25 DIAGNOSIS — Z87891 Personal history of nicotine dependence: Secondary | ICD-10-CM | POA: Insufficient documentation

## 2013-11-25 DIAGNOSIS — I10 Essential (primary) hypertension: Secondary | ICD-10-CM | POA: Insufficient documentation

## 2013-11-25 MED ORDER — HYDROCODONE-ACETAMINOPHEN 5-325 MG PO TABS: 1.0000 | ORAL_TABLET | ORAL | Status: DC | PRN

## 2013-11-25 MED ORDER — OXYCODONE-ACETAMINOPHEN 5-325 MG PO TABS: 2.0000 | ORAL_TABLET | Freq: Once | ORAL | Status: DC

## 2013-11-25 MED ORDER — HYDROCODONE-ACETAMINOPHEN 5-325 MG PO TABS
2.0000 | ORAL_TABLET | Freq: Once | ORAL | Status: AC
  Administered 2013-11-25: 2 via ORAL
  Filled 2013-11-25: qty 2

## 2013-11-25 NOTE — ED Notes (Signed)
Bed: WA22 Expected date:  Expected time:  Means of arrival:  Comments: EMS 

## 2013-11-25 NOTE — ED Notes (Signed)
Per EMS-pt c/o of fall today states that she was going down flight of stairs, tripped and fell. C/o of right knee pain, no deformities. Pain 6/10.

## 2013-11-25 NOTE — ED Provider Notes (Signed)
CSN: [id number]     Arrival date & time 11/25/13  1554 History   First MD Initiated Contact with Patient 11/25/13 1607     Chief Complaint  Patient presents with  . Fall   (Consider location/radiation/quality/duration/timing/severity/associated sxs/prior Treatment) HPI Comments: 57 year old female presents after a mechanical fall down 3 steps. She states she tripped while going down and landed on her back on the steps and injured her knee on the ground. She has chronic right knee pain and had arthroscopy that showed that she may need a knee replacement in the future. She states she always has some swelling to her right knee but as low but worse since a fall. Denies any weakness or numbness or tingling in her leg. Denies any trouble moving her extremities. Low back pain is diffuse. Did not hit her head or neck.   Past Medical History  Diagnosis Date  . Chest pain     a. s/p reported MI in 1997;  b. 09/2012 Cath: nl cors, nl LV ([place]);  c. 05/2012 Non-ischemic myoview.  . Hypertension   . Arthritis   . Problem with literacy   . Shortness of breath   . Anxiety   . Bronchitis   . Chronic headaches   . Obesity   . COPD (chronic obstructive pulmonary disease)   . Chronic diastolic CHF (congestive heart failure)     a. 12/2011 Echo: nl ef, Gr 1 DD;  b. 11/2012 Echo: EF 45-50%, no significant valvular abnormalities.  Marland Kitchen GERD (gastroesophageal reflux disease)   . Myocardial infarction 1997    self reported  . Hypothyroidism   . Asthma   . Chronic chest pain    Past Surgical History  Procedure Laterality Date  . Ectopic pregnancy surgery      right  . Nm myoview ltd  June 2013    no reversible ischemia  . Cardiac catheterization  October 2012    No coronary artery disease  . Cardiac catheterization  2012    normal coronary arteries  . Nm myoview ltd  2012, 2013    normal  . 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 Knee arthroscopy  08/13/13   right   Family History  Problem Relation Age of Onset  . Cancer Mother 79    lung ca  . Diabetes Mother   . Anxiety disorder Sister   . Cancer Brother 60    lung  . Colon cancer Neg Hx   . Pancreatic cancer Neg Hx   . Rectal cancer Neg Hx   . Stomach cancer Neg Hx    History  Substance Use Topics  . Smoking status: Former Smoker -- 1.00 packs/day for 3 years    Quit date: 12/03/2010  . Smokeless tobacco: Never Used  . Alcohol Use: No   OB History   Grav Para Term Preterm Abortions TAB SAB Ect Mult Living                 Review of Systems  Gastrointestinal: Negative for vomiting.  Musculoskeletal: Positive for back pain and joint swelling. Negative for neck pain.  Skin: Negative for color change and wound.  Neurological: Negative for weakness, numbness and headaches.  All other systems reviewed and are negative.    Allergies  Darvocet; Ibuprofen; Tramadol; Celebrex; Naproxen; and Penicillins  Home Medications   Current Outpatient Rx  Name  Route  Sig  Dispense  Refill  .  albuterol (PROVENTIL HFA;VENTOLIN HFA) 108 (90 BASE) MCG/ACT inhaler   Inhalation   Inhale 2 puffs into the lungs every 6 (six) hours as needed for wheezing or shortness of breath.         Marland Kitchen aspirin EC 81 MG tablet   Oral   Take 81 mg by mouth daily.         . beclomethasone (QVAR) 80 MCG/ACT inhaler   Inhalation   Inhale 1 puff into the lungs 2 (two) times daily.   1 Inhaler   5   . levothyroxine (SYNTHROID, LEVOTHROID) 50 MCG tablet   Oral   Take 1 tablet (50 mcg total) by mouth daily before breakfast.   30 tablet   4   . nitrofurantoin, macrocrystal-monohydrate, (MACROBID) 100 MG capsule   Oral   Take 100 mg by mouth 2 (two) times daily.          BP 118/68  Pulse 86  Temp(Src) 98.6 F (37 C) (Oral)  Resp 20  SpO2 96% Physical Exam  Vitals reviewed. Constitutional: She is oriented to person, place, and time. She appears well-developed and well-nourished. No distress.  Cervical collar and backboard in place.  obese  HENT:  Head: Normocephalic and atraumatic.  Right Ear: External ear normal.  Left Ear: External ear normal.  Nose: Nose normal.  Eyes: Right eye exhibits no discharge. Left eye exhibits no discharge.  Neck: Normal range of motion. No spinous process tenderness and no muscular tenderness present.  Cardiovascular: Normal rate, regular rhythm and normal heart sounds.   Pulses:      Dorsalis pedis pulses are 2+ on the right side, and 2+ on the left side.  Pulmonary/Chest: Effort normal and breath sounds normal.  Abdominal: Soft. There is no tenderness.  Musculoskeletal:       Right knee: Tenderness found.       Lumbar back: She exhibits tenderness.  Difficult to appreciate swelling due to the patient's large legs. Patient is able to straight leg raise her right lower extremity without difficulty.  Neurological: She is alert and oriented to person, place, and time.  Skin: Skin is warm and dry.    ED Course  Procedures (including critical care time) Labs Review Labs Reviewed - No data to display Imaging Review Dg Lumbar Spine Complete  11/25/2013   CLINICAL DATA:  Fall. Low back pain radiating towards the right flank.  EXAM: LUMBAR SPINE - COMPLETE 4+ VIEW  COMPARISON:  CT, 10/25/2013  FINDINGS: No fracture or spondylolisthesis. There is mild straightening of the normal lumbar lordosis and a slight dextroscoliosis with the apex at L2.  Mild loss of disc height at L3-L4 and L4-L5. Remaining lumbar discs are well preserved there is facet degenerative change most prominent on the right L5-S1.  Soft tissues are unremarkable.  IMPRESSION: No fracture or acute finding.  Mild degenerative changes as described.   Electronically Signed   By: Amie Portland M.D.   On: 11/25/2013 17:02   Dg Knee 4 Views W/patella Right  11/25/2013   CLINICAL DATA:  Fall.  Right knee pain.  EXAM: RIGHT KNEE - COMPLETE 4+ VIEW  COMPARISON:  None.  FINDINGS: No fracture.  The joint is normally space and aligned. Minimal marginal osteophytes are noted from the medial lateral compartments. There is no joint effusion. The soft tissues are unremarkable.  IMPRESSION: No fracture or acute finding. Minimal degenerative change. No joint effusion.   Electronically Signed   By: Amie Portland M.D.   On:  11/25/2013 17:03    EKG Interpretation   None       MDM   1. Fall, initial encounter   2. Lower back injury, initial encounter   3. Right knee injury, initial encounter    No neck pain or neck symptoms, collar cleared by Nexus criteria. No chest or abdominal symptoms. Nontender head or lose consciousness. Patient is neurovascularly intact, no obvious knee dislocation and I feel that vascular injury is unlikely. Given Norco for pain. Given her swelling, will put in knee immobilizer and recommend f/u with her orthopedic.    Audree Camel, MD 11/25/13 [phone]

## 2013-11-29 ENCOUNTER — Telehealth: Payer: Self-pay | Admitting: Internal Medicine

## 2013-11-29 NOTE — Telephone Encounter (Signed)
Pt Called because she said has been having SOB, pain on left side of her chest since yesterday. Pt describes as a  sharp pain; the pain comes and goes, score of "10" . Pt would like to be seen today. Pt was made aware that she needs to go to the ER because it is late to be seen in this office . Pt requested to asked  Dr. Jenel Lucks opinion about this.  Dr allred recommends for Dr. Rock Nephew to go to the ER, because if she had called earlier he would have seen her. I called pt's home number a mail person name Caryn Bee  answer stating that this was her phone number, but they have exchange phone a week ago and he did not know her new phone number. Caryn Bee was asked to let the pt know that MD recommended for her to go to the ER.  Pt called and Eligha Bridegroom  RN spoke with pt and made her aware that she needed to go to the ER. Pt verbalized understanding.

## 2013-11-29 NOTE — Telephone Encounter (Signed)
New message    C/o sob, dizziness and chest hurts.  Pt states it started last night and now has gotten worse.

## 2013-11-29 NOTE — Telephone Encounter (Signed)
Follow Up;  Pt is c/o Chest pain, SOB, Dizziness,

## 2013-11-29 NOTE — Telephone Encounter (Signed)
Follow up     Pt left a number you can call her back on.

## 2013-12-02 ENCOUNTER — Emergency Department (HOSPITAL_COMMUNITY): Payer: Medicaid Other

## 2013-12-02 ENCOUNTER — Emergency Department (HOSPITAL_COMMUNITY)
Admission: EM | Admit: 2013-12-02 | Discharge: 2013-12-02 | Disposition: A | Discharge status: Home / Self Care | Payer: Medicaid Other | Attending: Emergency Medicine | Admitting: Emergency Medicine

## 2013-12-02 ENCOUNTER — Encounter (HOSPITAL_COMMUNITY): Payer: Self-pay | Admitting: Emergency Medicine

## 2013-12-02 DIAGNOSIS — J3489 Other specified disorders of nose and nasal sinuses: Secondary | ICD-10-CM | POA: Insufficient documentation

## 2013-12-02 DIAGNOSIS — R002 Palpitations: Secondary | ICD-10-CM | POA: Insufficient documentation

## 2013-12-02 DIAGNOSIS — Z7982 Long term (current) use of aspirin: Secondary | ICD-10-CM | POA: Insufficient documentation

## 2013-12-02 DIAGNOSIS — Z87891 Personal history of nicotine dependence: Secondary | ICD-10-CM | POA: Insufficient documentation

## 2013-12-02 DIAGNOSIS — J441 Chronic obstructive pulmonary disease with (acute) exacerbation: Secondary | ICD-10-CM | POA: Insufficient documentation

## 2013-12-02 DIAGNOSIS — I10 Essential (primary) hypertension: Secondary | ICD-10-CM | POA: Insufficient documentation

## 2013-12-02 DIAGNOSIS — E039 Hypothyroidism, unspecified: Secondary | ICD-10-CM | POA: Insufficient documentation

## 2013-12-02 DIAGNOSIS — F411 Generalized anxiety disorder: Secondary | ICD-10-CM | POA: Insufficient documentation

## 2013-12-02 DIAGNOSIS — M129 Arthropathy, unspecified: Secondary | ICD-10-CM | POA: Insufficient documentation

## 2013-12-02 DIAGNOSIS — R0789 Other chest pain: Secondary | ICD-10-CM

## 2013-12-02 DIAGNOSIS — J45901 Unspecified asthma with (acute) exacerbation: Secondary | ICD-10-CM | POA: Insufficient documentation

## 2013-12-02 DIAGNOSIS — IMO0002 Reserved for concepts with insufficient information to code with codable children: Secondary | ICD-10-CM | POA: Insufficient documentation

## 2013-12-02 DIAGNOSIS — Z8719 Personal history of other diseases of the digestive system: Secondary | ICD-10-CM | POA: Insufficient documentation

## 2013-12-02 DIAGNOSIS — G8929 Other chronic pain: Secondary | ICD-10-CM | POA: Insufficient documentation

## 2013-12-02 DIAGNOSIS — Z79899 Other long term (current) drug therapy: Secondary | ICD-10-CM | POA: Insufficient documentation

## 2013-12-02 DIAGNOSIS — E669 Obesity, unspecified: Secondary | ICD-10-CM | POA: Insufficient documentation

## 2013-12-02 DIAGNOSIS — I252 Old myocardial infarction: Secondary | ICD-10-CM | POA: Insufficient documentation

## 2013-12-02 DIAGNOSIS — Z880 Allergy status to penicillin: Secondary | ICD-10-CM | POA: Insufficient documentation

## 2013-12-02 DIAGNOSIS — I5032 Chronic diastolic (congestive) heart failure: Secondary | ICD-10-CM | POA: Insufficient documentation

## 2013-12-02 DIAGNOSIS — R071 Chest pain on breathing: Secondary | ICD-10-CM | POA: Insufficient documentation

## 2013-12-02 DIAGNOSIS — R918 Other nonspecific abnormal finding of lung field: Secondary | ICD-10-CM

## 2013-12-02 DIAGNOSIS — K219 Gastro-esophageal reflux disease without esophagitis: Secondary | ICD-10-CM | POA: Insufficient documentation

## 2013-12-02 LAB — PRO B NATRIURETIC PEPTIDE: Pro B Natriuretic peptide (BNP): 22 pg/mL (ref 0–125)

## 2013-12-02 LAB — CBC
Hemoglobin: 14.6 g/dL (ref 12.0–15.0)
MCH: 28.8 pg (ref 26.0–34.0)
MCV: 86 fL (ref 78.0–100.0)
Platelets: 283 10*3/uL (ref 150–400)
RDW: 14.8 % (ref 11.5–15.5)
WBC: 6.6 10*3/uL (ref 4.0–10.5)

## 2013-12-02 LAB — COMPREHENSIVE METABOLIC PANEL
AST: 18 U/L (ref 0–37)
Albumin: 4.3 g/dL (ref 3.5–5.2)
BUN: 16 mg/dL (ref 6–23)
CO2: 26 mEq/L (ref 19–32)
Creatinine, Ser: 0.76 mg/dL (ref 0.50–1.10)
GFR calc Af Amer: >90 mL/min (ref >90)
Potassium: 4.1 mEq/L (ref 3.5–5.1)
Total Protein: 8.3 g/dL (ref 6.0–8.3)

## 2013-12-02 LAB — POCT I-STAT TROPONIN I: Troponin i, poc: 0 ng/mL (ref 0.00–0.08)

## 2013-12-02 MED ORDER — MORPHINE SULFATE 4 MG/ML IJ SOLN
4.0000 mg | Freq: Once | INTRAMUSCULAR | Status: AC
  Administered 2013-12-02: 4 mg via INTRAVENOUS
  Filled 2013-12-02: qty 1

## 2013-12-02 MED ORDER — GI COCKTAIL ~~LOC~~
30.0000 mL | Freq: Once | ORAL | Status: AC
  Administered 2013-12-02: 30 mL via ORAL
  Filled 2013-12-02: qty 30

## 2013-12-02 MED ORDER — IBUPROFEN-FAMOTIDINE 800-26.6 MG PO TABS: 1.0000 | ORAL_TABLET | Freq: Three times a day (TID) | ORAL | Status: DC

## 2013-12-02 MED ORDER — SODIUM CHLORIDE 0.9 % IV BOLUS (SEPSIS)
1000.0000 mL | Freq: Once | INTRAVENOUS | Status: AC
  Administered 2013-12-02: 1000 mL via INTRAVENOUS

## 2013-12-02 MED ORDER — ALBUTEROL SULFATE (5 MG/ML) 0.5% IN NEBU
5.0000 mg | INHALATION_SOLUTION | Freq: Once | RESPIRATORY_TRACT | Status: AC
  Administered 2013-12-02: 5 mg via RESPIRATORY_TRACT
  Filled 2013-12-02: qty 1

## 2013-12-02 MED ORDER — IPRATROPIUM BROMIDE 0.02 % IN SOLN
0.5000 mg | Freq: Once | RESPIRATORY_TRACT | Status: AC
  Administered 2013-12-02: 0.5 mg via RESPIRATORY_TRACT
  Filled 2013-12-02: qty 2.5

## 2013-12-02 MED ORDER — NITROGLYCERIN 0.4 MG SL SUBL: 0.4000 mg | SUBLINGUAL_TABLET | SUBLINGUAL | Status: DC | PRN

## 2013-12-02 MED ORDER — ONDANSETRON HCL 4 MG/2ML IJ SOLN
4.0000 mg | Freq: Once | INTRAMUSCULAR | Status: AC
  Administered 2013-12-02: 4 mg via INTRAVENOUS
  Filled 2013-12-02: qty 2

## 2013-12-02 MED ORDER — IOHEXOL 350 MG/ML SOLN
100.0000 mL | Freq: Once | INTRAVENOUS | Status: AC | PRN
  Administered 2013-12-02: 100 mL via INTRAVENOUS

## 2013-12-02 NOTE — ED Provider Notes (Signed)
Medical screening examination/treatment/procedure(s) were conducted as a shared visit with non-physician practitioner(s) and myself.  I personally evaluated the patient during the encounter.  EKG Interpretation    Date/Time:  Thursday December 02 2013 11:07:00 EST Ventricular Rate:  75 PR Interval:  140 QRS Duration: 86 QT Interval:  418 QTC Calculation: 466 R Axis:   99 Text Interpretation:  Normal sinus rhythm Rightward axis Possible Inferior infarct , age undetermined Abnormal ECG compared to prior, axis more rightward Confirmed by [facility]  MD, TREY ([id number]) on 12/02/2013 2:21:03 PM            57 yo female with hx of chronic chest pain who reports with similar but worsening pain and shortness of breath.  History is not consistent with ACS and she has had numerous negative cardiac tests.  Ddimer elevated, so plan to pursue CTA to rule out PE.  Otherwise, believe her pain is likely musculoskeletal in origin.  On exam, she is well appearing, not distressed, has anterior chest wall tenderness, clear lungs, and normal heart sounds.   Clinical Impression: 1. Chest wall pain   2. Multiple pulmonary nodules       Candyce Churn, MD 12/02/13 2249

## 2013-12-02 NOTE — ED Provider Notes (Signed)
Medical screening examination/treatment/procedure(s) were conducted as a shared visit with non-physician practitioner(s) and myself.  I personally evaluated the patient during the encounter.   Please see my separate note.     Candyce Churn, MD 12/02/13 2250

## 2013-12-02 NOTE — ED Notes (Signed)
Patient c/o chest pain onset 3-4 days ago c/o cough states pain is worse with cough. C/o back pain and pain in her right arm.

## 2013-12-02 NOTE — ED Notes (Signed)
Pt c/o intermittent CP that started about 4 days ago, sts sometimes she feels like her heart is racing. Denies becoming diaphoretic with the pain. C/o recent dry cough. Pt mainly concerned about her back pain, sts its been going on for a while but hasn't been able to get rid of it. Denies taking anything at home. CP worse with palpation.

## 2013-12-02 NOTE — ED Provider Notes (Signed)
CSN: [id number]     Arrival date & time 12/02/13  1057 History   First MD Initiated Contact with Patient 12/02/13 1131     Chief Complaint  Patient presents with  . Chest Pain   (Consider location/radiation/quality/duration/timing/severity/associated sxs/prior Treatment) HPI Comments: Patient is a 57 year old female past medical history significant for hypertension, COPD, CHF, GERD, reported history of MI next 97, recent cardiac ablation February 2014 for irregular heart beat, chronic chest pain presenting to the emergency department for 3-4 days of waxing and waning sharp chest pain with associated nonproductive cough, palpitations, and difficulty breathing. Patient states her pain is 10/10 with no alleviating factors. She denies any aggravating factors. Patient states the chest pain feels like it to prior to having the ablation. Patient is followup of our cardiology. She's not had any further extensive testing done since prior to her ablation. She states she had an echocardiogram prior to the ablation. Patient has a history of a normal cardiac catheterization without stent placement by Dr. Algie Coffer in October of 2013.   Patient is a 57 y.o. female presenting with chest pain.  Chest Pain Associated symptoms: cough, palpitations and shortness of breath   Associated symptoms: no diaphoresis, no fever, no nausea and not vomiting     Past Medical History  Diagnosis Date  . Chest pain     a. s/p reported MI in 1997;  b. 09/2012 Cath: nl cors, nl LV ([place]);  c. 05/2012 Non-ischemic myoview.  . Hypertension   . Arthritis   . Problem with literacy   . Shortness of breath   . Anxiety   . Bronchitis   . Chronic headaches   . Obesity   . COPD (chronic obstructive pulmonary disease)   . Chronic diastolic CHF (congestive heart failure)     a. 12/2011 Echo: nl ef, Gr 1 DD;  b. 11/2012 Echo: EF 45-50%, no significant valvular abnormalities.  Marland Kitchen GERD (gastroesophageal reflux disease)   . Myocardial  infarction 1997    self reported  . Hypothyroidism   . Asthma   . Chronic chest pain    Past Surgical History  Procedure Laterality Date  . Ectopic pregnancy surgery      right  . Nm myoview ltd  June 2013    no reversible ischemia  . Cardiac catheterization  October 2012    No coronary artery disease  . Cardiac catheterization  2012    normal coronary arteries  . Nm myoview ltd  2012, 2013    normal  . 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 Knee arthroscopy  08/13/13    right   Family History  Problem Relation Age of Onset  . Cancer Mother 45    lung ca  . Diabetes Mother   . Anxiety disorder Sister   . Cancer Brother 60    lung  . Colon cancer Neg Hx   . Pancreatic cancer Neg Hx   . Rectal cancer Neg Hx   . Stomach cancer Neg Hx    History  Substance Use Topics  . Smoking status: Former Smoker -- 1.00 packs/day for 3 years    Quit date: 12/03/2010  . Smokeless tobacco: Never Used  . Alcohol Use: No   OB History   Grav Para Term Preterm Abortions TAB SAB Ect Mult Living                 Review of Systems  Constitutional: Negative for fever and diaphoresis.  HENT: Positive for congestion and rhinorrhea.   Respiratory: Positive for cough and shortness of breath.   Cardiovascular: Positive for chest pain and palpitations. Negative for leg swelling.  Gastrointestinal: Negative for nausea and vomiting.  All other systems reviewed and are negative.    Allergies  Darvocet; Ibuprofen; Tramadol; Celebrex; Naproxen; and Penicillins  Home Medications   Current Outpatient Rx  Name  Route  Sig  Dispense  Refill  . albuterol (PROVENTIL HFA;VENTOLIN HFA) 108 (90 BASE) MCG/ACT inhaler   Inhalation   Inhale 2 puffs into the lungs every 6 (six) hours as needed for wheezing or shortness of breath.         Marland Kitchen aspirin EC 81 MG tablet   Oral   Take 81 mg by mouth daily.         . beclomethasone (QVAR) 80 MCG/ACT inhaler    Inhalation   Inhale 1 puff into the lungs 2 (two) times daily.   1 Inhaler   5   . nitrofurantoin, macrocrystal-monohydrate, (MACROBID) 100 MG capsule   Oral   Take 100 mg by mouth 2 (two) times daily.         . Ibuprofen-Famotidine (DUEXIS) 800-26.6 MG TABS   Oral   Take 1 tablet by mouth 3 (three) times daily with meals.   20 tablet   0   . levothyroxine (SYNTHROID, LEVOTHROID) 50 MCG tablet   Oral   Take 1 tablet (50 mcg total) by mouth daily before breakfast.   30 tablet   4    BP 132/68  Pulse 75  Temp(Src) 98.1 F (36.7 C) (Oral)  Resp 14  Wt 220 lb (99.791 kg)  SpO2 100% Physical Exam  Constitutional: She is oriented to person, place, and time. She appears well-developed and well-nourished. No distress.  HENT:  Head: Normocephalic and atraumatic.  Right Ear: External ear normal.  Left Ear: External ear normal.  Nose: Nose normal.  Mouth/Throat: No oropharyngeal exudate.  Eyes: Conjunctivae are normal.  Neck: Neck supple.  Cardiovascular: Normal rate, regular rhythm, normal heart sounds and intact distal pulses.   Pulmonary/Chest: Effort normal and breath sounds normal. No respiratory distress. She exhibits tenderness.  Abdominal: Soft. There is no tenderness.  Musculoskeletal: She exhibits no edema and no tenderness.  Lymphadenopathy:    She has no cervical adenopathy.  Neurological: She is alert and oriented to person, place, and time.  Skin: Skin is warm and dry. She is not diaphoretic.    ED Course  Procedures (including critical care time) Medications  nitroGLYCERIN (NITROSTAT) SL tablet 0.4 mg (not administered)  gi cocktail (Maalox,Lidocaine,Donnatal) (30 mLs Oral Given 12/02/13 1429)  albuterol (PROVENTIL) (5 MG/ML) 0.5% nebulizer solution 5 mg (5 mg Nebulization Given 12/02/13 1504)  ipratropium (ATROVENT) nebulizer solution 0.5 mg (0.5 mg Nebulization Given 12/02/13 1504)  morphine 4 MG/ML injection 4 mg (4 mg Intravenous Given 12/02/13 1552)   ondansetron (ZOFRAN) injection 4 mg (4 mg Intravenous Given 12/02/13 1552)  sodium chloride 0.9 % bolus 1,000 mL (0 mLs Intravenous Stopped 12/02/13 1835)  iohexol (OMNIPAQUE) 350 MG/ML injection 100 mL (100 mLs Intravenous Contrast Given 12/02/13 1629)  morphine 4 MG/ML injection 4 mg (4 mg Intravenous Given 12/02/13 1825)    Labs Review Labs Reviewed  D-DIMER, QUANTITATIVE - Abnormal; Notable for the following:    D-Dimer, Quant 3.93 (*)    All other components within normal limits  CBC  PRO B NATRIURETIC PEPTIDE  COMPREHENSIVE METABOLIC PANEL  POCT I-STAT TROPONIN I   Imaging Review Ct Angio Chest W/cm &/or Wo Cm  12/02/2013   CLINICAL DATA:  Chest pain  EXAM: CT ANGIOGRAPHY CHEST WITH CONTRAST  TECHNIQUE: Multidetector CT imaging of the chest was performed using the standard protocol during bolus administration of intravenous contrast. Multiplanar CT image reconstructions including MIPs were obtained to evaluate the vascular anatomy.  CONTRAST:  OMNIPAQUE IOHEXOL 350 MG/ML SOLN  COMPARISON:  Chest radiograph December 02, 2013 and chest CT angiogram February 22, 2013  FINDINGS: There is no demonstrable pulmonary embolus. There is no thoracic aortic aneurysm or dissection.  On axial slice 39, there is a 3 mm nodular opacity in the posterior segment of the right upper lobe. On axial slice 36, there is a 2 mm nodular opacity in the anterior segment of the right upper lobe. On axial slice 36, there is a 2 mm nodular opacity in the posterior segment of the right upper lobe. On axial slice 44, there is a 2 mm nodular opacity in the posterior segment of the left upper lobe. There is lower lobe bronchiectatic change bilaterally. There is no edema or consolidation.  There is no thoracic adenopathy. The pericardium is not thickened. There is a small hiatal hernia.  Visualized upper abdominal structures appear normal. There are no blastic or lytic bone lesions. Thyroid appears normal.  Review of the  MIP images confirms the above findings.  IMPRESSION: Several small nodular opacities. Followup of these nodular opacity should be based on Fleischner Society guidelines. If the patient is at high risk for bronchogenic carcinoma, follow-up chest CT at 1year is recommended. If the patient is at low risk, no follow-up is needed. This recommendation follows the consensus statement: Guidelines for Management of Small Pulmonary Nodules Detected on CT Scans: A Statement from the Fleischner Society as published in Radiology 2005; 237:395-400.  No demonstrable pulmonary embolus.  Small hiatal hernia.   Electronically Signed   By: Bretta Bang M.D.   On: 12/02/2013 17:38   Dg Chest Port 1 View  12/02/2013   CLINICAL DATA:  Chest pain with shortness of breath.  EXAM: PORTABLE CHEST - 1 VIEW  COMPARISON:  11/18/2013 and 07/15/2013  FINDINGS: Lungs are hypoinflated with minimal prominence of the perihilar markings likely due to the degree of hypoinflation. No definite consolidation or effusion is noted. Mild prominence of the cardiac silhouette. Remainder of the exam is unchanged.  IMPRESSION: Hypoinflation without acute cardiopulmonary disease.   Electronically Signed   By: Elberta Fortis M.D.   On: 12/02/2013 12:40    EKG Interpretation    Date/Time:  Thursday December 02 2013 11:07:00 EST Ventricular Rate:  75 PR Interval:  140 QRS Duration: 86 QT Interval:  418 QTC Calculation: 466 R Axis:   99 Text Interpretation:  Normal sinus rhythm Rightward axis Possible Inferior infarct , age undetermined Abnormal ECG compared to prior, axis more rightward Confirmed by [facility]  MD, TREY ([id number]) on 12/02/2013 2:21:03 PM            MDM   1. Chest wall pain   2. Multiple pulmonary nodules     6:09 PM Discussed stability from 02/2013 CTA to today's read regarding nodules w/ Dr. Margarita Grizzle. The previous 4-12mm nodules noted in R middle lobe in March have resolved and these are new nodules from previous CT.  Discussed results with patient who understands the need to follow up and have a CT done in one year.   Afebrile, NAD, non-toxic appearing,  AAOx4. Patient is to be discharged with recommendation to follow up with PCP in regards to today's hospital visit. Chest pain is not likely of cardiac or pulmonary etiology d/t presentation, negative CTA scan for PE or other acute cardiopulmonary abnormality, small pulmonary nodules noted and will require CTA in one year for re-evaluation, VSS, no tracheal deviation, no JVD or new murmur, RRR, breath sounds equal bilaterally, EKG without acute abnormalities, negative troponin, and negative CXR. Pt has been advised start Duexis and return to the ED is CP becomes exertional, associated with diaphoresis or nausea, radiates to left jaw/arm, worsens or becomes concerning in any way. Also advised to f/u with cardiologist, PCP, and pulmonologist. Pt appears reliable for follow up and is agreeable to discharge.   Case has been discussed with and seen by Dr. Loretha Stapler who agrees with the above plan to discharge.         Jeannetta Ellis, PA-C 12/02/13 1913

## 2013-12-11 ENCOUNTER — Telehealth: Payer: Self-pay | Admitting: Nurse Practitioner

## 2013-12-11 ENCOUNTER — Emergency Department (HOSPITAL_COMMUNITY)
Admission: EM | Admit: 2013-12-11 | Discharge: 2013-12-11 | Disposition: A | Discharge status: Home / Self Care | Payer: Medicaid Other | Attending: Emergency Medicine | Admitting: Emergency Medicine

## 2013-12-11 ENCOUNTER — Encounter (HOSPITAL_COMMUNITY): Payer: Self-pay | Admitting: Emergency Medicine

## 2013-12-11 DIAGNOSIS — Z8669 Personal history of other diseases of the nervous system and sense organs: Secondary | ICD-10-CM | POA: Insufficient documentation

## 2013-12-11 DIAGNOSIS — R071 Chest pain on breathing: Secondary | ICD-10-CM | POA: Insufficient documentation

## 2013-12-11 DIAGNOSIS — E669 Obesity, unspecified: Secondary | ICD-10-CM | POA: Insufficient documentation

## 2013-12-11 DIAGNOSIS — I252 Old myocardial infarction: Secondary | ICD-10-CM | POA: Insufficient documentation

## 2013-12-11 DIAGNOSIS — I5032 Chronic diastolic (congestive) heart failure: Secondary | ICD-10-CM | POA: Insufficient documentation

## 2013-12-11 DIAGNOSIS — Z7982 Long term (current) use of aspirin: Secondary | ICD-10-CM | POA: Insufficient documentation

## 2013-12-11 DIAGNOSIS — J4489 Other specified chronic obstructive pulmonary disease: Secondary | ICD-10-CM | POA: Insufficient documentation

## 2013-12-11 DIAGNOSIS — Z87891 Personal history of nicotine dependence: Secondary | ICD-10-CM | POA: Insufficient documentation

## 2013-12-11 DIAGNOSIS — Z8709 Personal history of other diseases of the respiratory system: Secondary | ICD-10-CM | POA: Insufficient documentation

## 2013-12-11 DIAGNOSIS — I10 Essential (primary) hypertension: Secondary | ICD-10-CM | POA: Insufficient documentation

## 2013-12-11 DIAGNOSIS — R0789 Other chest pain: Secondary | ICD-10-CM

## 2013-12-11 DIAGNOSIS — Z885 Allergy status to narcotic agent status: Secondary | ICD-10-CM | POA: Insufficient documentation

## 2013-12-11 DIAGNOSIS — Z559 Problems related to education and literacy, unspecified: Secondary | ICD-10-CM | POA: Insufficient documentation

## 2013-12-11 DIAGNOSIS — F411 Generalized anxiety disorder: Secondary | ICD-10-CM | POA: Insufficient documentation

## 2013-12-11 DIAGNOSIS — J45909 Unspecified asthma, uncomplicated: Secondary | ICD-10-CM | POA: Insufficient documentation

## 2013-12-11 DIAGNOSIS — K219 Gastro-esophageal reflux disease without esophagitis: Secondary | ICD-10-CM | POA: Insufficient documentation

## 2013-12-11 DIAGNOSIS — M129 Arthropathy, unspecified: Secondary | ICD-10-CM | POA: Insufficient documentation

## 2013-12-11 DIAGNOSIS — Z888 Allergy status to other drugs, medicaments and biological substances status: Secondary | ICD-10-CM | POA: Insufficient documentation

## 2013-12-11 DIAGNOSIS — R0602 Shortness of breath: Secondary | ICD-10-CM | POA: Insufficient documentation

## 2013-12-11 DIAGNOSIS — E039 Hypothyroidism, unspecified: Secondary | ICD-10-CM | POA: Insufficient documentation

## 2013-12-11 DIAGNOSIS — Z79899 Other long term (current) drug therapy: Secondary | ICD-10-CM | POA: Insufficient documentation

## 2013-12-11 DIAGNOSIS — Z880 Allergy status to penicillin: Secondary | ICD-10-CM | POA: Insufficient documentation

## 2013-12-11 DIAGNOSIS — J449 Chronic obstructive pulmonary disease, unspecified: Secondary | ICD-10-CM | POA: Insufficient documentation

## 2013-12-11 LAB — POCT I-STAT TROPONIN I: Troponin i, poc: 0 ng/mL (ref 0.00–0.08)

## 2013-12-11 MED ORDER — OXYCODONE-ACETAMINOPHEN 5-325 MG PO TABS: 2.0000 | ORAL_TABLET | Freq: Three times a day (TID) | ORAL | Status: DC | PRN

## 2013-12-11 MED ORDER — OXYCODONE-ACETAMINOPHEN 5-325 MG PO TABS
2.0000 | ORAL_TABLET | Freq: Once | ORAL | Status: AC
  Administered 2013-12-11: 2 via ORAL
  Filled 2013-12-11: qty 2

## 2013-12-11 NOTE — Telephone Encounter (Signed)
Pt called this afternoon stating that for the past couple of days she has noted fast heart beats and fluttering.  HR is faster than it has been in the past when she was having symptomatic PVC's.  I recommended that it would be prudent for her to get an ECG today.  She plans to go to a local urgent care.

## 2013-12-11 NOTE — ED Notes (Signed)
Pt states she chewed 5 baby aspirin 1 hour before arriving at ED

## 2013-12-11 NOTE — ED Notes (Signed)
Pt will have ride home. Pt waiting in waiting area for ride

## 2013-12-11 NOTE — ED Notes (Signed)
Pt is from home. For 2 days patient has complained of dyspnea, chest palpitations, vomiting. EMS states denies fever, chills, no specific pain. Pt states she has had reduced appetite. Pt has been coughing up brown sputum. Today even with inhaler use dyspnea hasn't been relieved Hx of asthma. Pt on 3 L O which has helped dyspnea and palpitation HR-84 Cbg- 88 BP- 137/98 97% 3 L O2

## 2013-12-11 NOTE — ED Provider Notes (Signed)
CSN: [id number]     Arrival date & time 12/11/13  1715 History   First MD Initiated Contact with Patient 12/11/13 1736     Chief Complaint  Patient presents with  . Palpitations  . Shortness of Breath   (Consider location/radiation/quality/duration/timing/severity/associated sxs/prior Treatment) HPI 57 year old female history of COPD, heart failure, nonischemic cardiomyopathy, states for the last 3-4 weeks is a constant chest pain 24 hours a day worse with position changes coughing and deep breathing associated with a cough and constant feeling of palpitations as if her heart is racing with mild shortness of breath 24 hours a day unchanging in the last few weeks with unremarkable chest x-ray CT scan negative for pulmonary embolism and unremarkable blood counts all within the last few weeks for the same symptoms which is now resolved since last ED visit just over a week ago for the same symptoms; no fever no confusion no rash no vomiting no exertional chest pain. Pain is sharp stabbing diffuse anterior chest pain without radiation moderately severe. Past Medical History  Diagnosis Date  . Chest pain     a. s/p reported MI in 1997;  b. 09/2012 Cath: nl cors, nl LV ([place]);  c. 05/2012 Non-ischemic myoview.  . Hypertension   . Arthritis   . Problem with literacy   . Shortness of breath   . Anxiety   . Bronchitis   . Chronic headaches   . Obesity   . COPD (chronic obstructive pulmonary disease)   . Chronic diastolic CHF (congestive heart failure)     a. 12/2011 Echo: nl ef, Gr 1 DD;  b. 11/2012 Echo: EF 45-50%, no significant valvular abnormalities.  Marland Kitchen GERD (gastroesophageal reflux disease)   . Myocardial infarction 1997    self reported  . Hypothyroidism   . Asthma   . Chronic chest pain    Past Surgical History  Procedure Laterality Date  . Ectopic pregnancy surgery      right  . Nm myoview ltd  June 2013    no reversible ischemia  . Cardiac catheterization  October 2012    No  coronary artery disease  . Cardiac catheterization  2012    normal coronary arteries  . Nm myoview ltd  2012, 2013    normal  . 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 Knee arthroscopy  08/13/13    right   Family History  Problem Relation Age of Onset  . Cancer Mother 92    lung ca  . Diabetes Mother   . Anxiety disorder Sister   . Cancer Brother 60    lung  . Colon cancer Neg Hx   . Pancreatic cancer Neg Hx   . Rectal cancer Neg Hx   . Stomach cancer Neg Hx    History  Substance Use Topics  . Smoking status: Former Smoker -- 1.00 packs/day for 3 years    Quit date: 12/03/2010  . Smokeless tobacco: Never Used  . Alcohol Use: No   OB History   Grav Para Term Preterm Abortions TAB SAB Ect Mult Living                 Review of Systems 10 Systems reviewed and are negative for acute change except as noted in the HPI. Allergies  Darvocet; Ibuprofen; Tramadol; Celebrex; Naproxen; and Penicillins  Home Medications   Current Outpatient Rx  Name  Route  Sig  Dispense  Refill  .  albuterol (PROVENTIL HFA;VENTOLIN HFA) 108 (90 BASE) MCG/ACT inhaler   Inhalation   Inhale 2 puffs into the lungs every 6 (six) hours as needed for wheezing or shortness of breath.         Marland Kitchen aspirin EC 81 MG tablet   Oral   Take 81 mg by mouth daily.         . beclomethasone (QVAR) 80 MCG/ACT inhaler   Inhalation   Inhale 1 puff into the lungs 2 (two) times daily.   1 Inhaler   5   . levothyroxine (SYNTHROID, LEVOTHROID) 50 MCG tablet   Oral   Take 1 tablet (50 mcg total) by mouth daily before breakfast.   30 tablet   4   . nitrofurantoin, macrocrystal-monohydrate, (MACROBID) 100 MG capsule   Oral   Take 100 mg by mouth 2 (two) times daily.         Marland Kitchen oxyCODONE-acetaminophen (PERCOCET) 5-325 MG per tablet   Oral   Take 2 tablets by mouth every 8 (eight) hours as needed for severe pain.   20 tablet   0    BP 128/82  Pulse 66  Temp(Src) 97.8  F (36.6 C) (Oral)  Resp 18  SpO2 99% Physical Exam  Nursing note and vitals reviewed. Constitutional:  Awake, alert, nontoxic appearance.  HENT:  Head: Atraumatic.  Eyes: Right eye exhibits no discharge. Left eye exhibits no discharge.  Neck: Neck supple.  Cardiovascular: Normal rate and regular rhythm.   No murmur heard. Pulmonary/Chest: Effort normal and breath sounds normal. No respiratory distress. She has no wheezes. She has no rales. She exhibits tenderness.  Reproducible diffuse anterior and lateral chest wall tenderness without rash noted  Abdominal: Soft. There is no tenderness. There is no rebound.  Musculoskeletal: She exhibits no edema and no tenderness.  Baseline ROM, no obvious new focal weakness.  Neurological:  Mental status and motor strength appears baseline for patient and situation.  Skin: No rash noted.  Psychiatric: She has a normal mood and affect.    ED Course  Procedures (including critical care time) Patient informed of clinical course, understand medical decision-making process, and agree with plan. Labs Review Labs Reviewed  POCT I-STAT TROPONIN I   Imaging Review No results found.  EKG Interpretation    Date/Time:  Saturday December 11 2013 17:24:33 EST Ventricular Rate:  71 PR Interval:  148 QRS Duration: 87 QT Interval:  427 QTC Calculation: 464 R Axis:   34 Text Interpretation:  Age not entered, assumed to be  57 years old for purpose of ECG interpretation Sinus rhythm Low voltage, extremity leads No significant change since last tracing Confirmed by [facility]  MD, Errik Mitchelle ([id number]) on 12/11/2013 5:51:01 PM            MDM   1. Chest wall pain    I doubt any other [facility] precluding discharge at this time including, but not necessarily limited to the following:ACS, PE.    Hurman Horn, MD 12/13/13 2124

## 2013-12-20 ENCOUNTER — Emergency Department (HOSPITAL_COMMUNITY)
Admission: EM | Admit: 2013-12-20 | Discharge: 2013-12-20 | Disposition: A | Discharge status: Home / Self Care | Payer: Medicaid Other | Attending: Emergency Medicine | Admitting: Emergency Medicine

## 2013-12-20 ENCOUNTER — Emergency Department (HOSPITAL_COMMUNITY): Payer: Medicaid Other

## 2013-12-20 ENCOUNTER — Encounter (HOSPITAL_COMMUNITY): Payer: Self-pay | Admitting: Emergency Medicine

## 2013-12-20 DIAGNOSIS — J45901 Unspecified asthma with (acute) exacerbation: Secondary | ICD-10-CM | POA: Insufficient documentation

## 2013-12-20 DIAGNOSIS — M2550 Pain in unspecified joint: Secondary | ICD-10-CM

## 2013-12-20 DIAGNOSIS — Z8639 Personal history of other endocrine, nutritional and metabolic disease: Secondary | ICD-10-CM | POA: Insufficient documentation

## 2013-12-20 DIAGNOSIS — I10 Essential (primary) hypertension: Secondary | ICD-10-CM | POA: Insufficient documentation

## 2013-12-20 DIAGNOSIS — I252 Old myocardial infarction: Secondary | ICD-10-CM | POA: Insufficient documentation

## 2013-12-20 DIAGNOSIS — I5032 Chronic diastolic (congestive) heart failure: Secondary | ICD-10-CM | POA: Insufficient documentation

## 2013-12-20 DIAGNOSIS — Z8659 Personal history of other mental and behavioral disorders: Secondary | ICD-10-CM | POA: Insufficient documentation

## 2013-12-20 DIAGNOSIS — Z7982 Long term (current) use of aspirin: Secondary | ICD-10-CM | POA: Insufficient documentation

## 2013-12-20 DIAGNOSIS — G8929 Other chronic pain: Secondary | ICD-10-CM | POA: Insufficient documentation

## 2013-12-20 DIAGNOSIS — Z880 Allergy status to penicillin: Secondary | ICD-10-CM | POA: Insufficient documentation

## 2013-12-20 DIAGNOSIS — M129 Arthropathy, unspecified: Secondary | ICD-10-CM | POA: Insufficient documentation

## 2013-12-20 DIAGNOSIS — M549 Dorsalgia, unspecified: Secondary | ICD-10-CM

## 2013-12-20 DIAGNOSIS — R079 Chest pain, unspecified: Secondary | ICD-10-CM | POA: Insufficient documentation

## 2013-12-20 DIAGNOSIS — R509 Fever, unspecified: Secondary | ICD-10-CM | POA: Insufficient documentation

## 2013-12-20 DIAGNOSIS — J441 Chronic obstructive pulmonary disease with (acute) exacerbation: Secondary | ICD-10-CM | POA: Insufficient documentation

## 2013-12-20 DIAGNOSIS — Z9889 Other specified postprocedural states: Secondary | ICD-10-CM | POA: Insufficient documentation

## 2013-12-20 DIAGNOSIS — M79609 Pain in unspecified limb: Secondary | ICD-10-CM | POA: Insufficient documentation

## 2013-12-20 DIAGNOSIS — Z79899 Other long term (current) drug therapy: Secondary | ICD-10-CM | POA: Insufficient documentation

## 2013-12-20 DIAGNOSIS — Z8719 Personal history of other diseases of the digestive system: Secondary | ICD-10-CM | POA: Insufficient documentation

## 2013-12-20 DIAGNOSIS — R002 Palpitations: Secondary | ICD-10-CM | POA: Insufficient documentation

## 2013-12-20 DIAGNOSIS — Z862 Personal history of diseases of the blood and blood-forming organs and certain disorders involving the immune mechanism: Secondary | ICD-10-CM | POA: Insufficient documentation

## 2013-12-20 DIAGNOSIS — Z87891 Personal history of nicotine dependence: Secondary | ICD-10-CM | POA: Insufficient documentation

## 2013-12-20 DIAGNOSIS — IMO0002 Reserved for concepts with insufficient information to code with codable children: Secondary | ICD-10-CM | POA: Insufficient documentation

## 2013-12-20 DIAGNOSIS — E669 Obesity, unspecified: Secondary | ICD-10-CM | POA: Insufficient documentation

## 2013-12-20 LAB — BASIC METABOLIC PANEL
BUN: 10 mg/dL (ref 6–23)
CHLORIDE: 101 meq/L (ref 96–112)
CO2: 25 meq/L (ref 19–32)
Calcium: 9.2 mg/dL (ref 8.4–10.5)
Creatinine, Ser: 0.78 mg/dL (ref 0.50–1.10)
GFR calc Af Amer: >90 mL/min (ref >90)
GFR calc non Af Amer: >90 mL/min (ref >90)
GLUCOSE: 90 mg/dL (ref 70–99)
Potassium: 3.8 mEq/L (ref 3.7–5.3)
SODIUM: 138 meq/L (ref 137–147)

## 2013-12-20 LAB — CBC WITH DIFFERENTIAL/PLATELET
Basophils Absolute: 0 10*3/uL (ref 0.0–0.1)
Basophils Relative: 0 % (ref 0–1)
Eosinophils Absolute: 0.2 10*3/uL (ref 0.0–0.7)
Eosinophils Relative: 2 % (ref 0–5)
HEMATOCRIT: 38.5 % (ref 36.0–46.0)
Hemoglobin: 12.8 g/dL (ref 12.0–15.0)
LYMPHS ABS: 2.1 10*3/uL (ref 0.7–4.0)
LYMPHS PCT: 28 % (ref 12–46)
MCH: 28.3 pg (ref 26.0–34.0)
MCHC: 33.2 g/dL (ref 30.0–36.0)
MCV: 85.2 fL (ref 78.0–100.0)
MONO ABS: 0.5 10*3/uL (ref 0.1–1.0)
Monocytes Relative: 7 % (ref 3–12)
NEUTROS ABS: 4.6 10*3/uL (ref 1.7–7.7)
Neutrophils Relative %: 62 % (ref 43–77)
Platelets: 292 10*3/uL (ref 150–400)
RBC: 4.52 MIL/uL (ref 3.87–5.11)
RDW: 15 % (ref 11.5–15.5)
WBC: 7.4 10*3/uL (ref 4.0–10.5)

## 2013-12-20 LAB — POCT I-STAT TROPONIN I: Troponin i, poc: 0.01 ng/mL (ref 0.00–0.08)

## 2013-12-20 MED ORDER — HYDROCODONE-ACETAMINOPHEN 5-325 MG PO TABS
2.0000 | ORAL_TABLET | Freq: Once | ORAL | Status: AC
  Administered 2013-12-20: 2 via ORAL
  Filled 2013-12-20: qty 2

## 2013-12-20 MED ORDER — HYDROCODONE-ACETAMINOPHEN 5-325 MG PO TABS: 1.0000 | ORAL_TABLET | Freq: Four times a day (QID) | ORAL | Status: DC | PRN

## 2013-12-20 MED ORDER — PROMETHAZINE HCL 25 MG PO TABS: 25.0000 mg | ORAL_TABLET | Freq: Four times a day (QID) | ORAL | Status: DC | PRN

## 2013-12-20 MED ORDER — ONDANSETRON 8 MG PO TBDP
8.0000 mg | ORAL_TABLET | Freq: Once | ORAL | Status: AC
  Administered 2013-12-20: 8 mg via ORAL
  Filled 2013-12-20: qty 1

## 2013-12-20 NOTE — Discharge Instructions (Signed)
Arthralgia °Your caregiver has diagnosed you as suffering from an arthralgia. Arthralgia means there is pain in a joint. This can come from many reasons including: °· Bruising the joint which causes soreness (inflammation) in the joint. °· Wear and tear on the joints which occur as we grow older (osteoarthritis). °· Overusing the joint. °· Various forms of arthritis. °· Infections of the joint. °Regardless of the cause of pain in your joint, most of these different pains respond to anti-inflammatory drugs and rest. The exception to this is when a joint is infected, and these cases are treated with antibiotics, if it is a bacterial infection. °HOME CARE INSTRUCTIONS  °· Rest the injured area for as long as directed by your caregiver. Then slowly start using the joint as directed by your caregiver and as the pain allows. Crutches as directed may be useful if the ankles, knees or hips are involved. If the knee was splinted or casted, continue use and care as directed. If an stretchy or elastic wrapping bandage has been applied today, it should be removed and re-applied every 3 to 4 hours. It should not be applied tightly, but firmly enough to keep swelling down. Watch toes and feet for swelling, bluish discoloration, coldness, numbness or excessive pain. If any of these problems (symptoms) occur, remove the ace bandage and re-apply more loosely. If these symptoms persist, contact your caregiver or return to this location. °· For the first 24 hours, keep the injured extremity elevated on pillows while lying down. °· Apply ice for 15-20 minutes to the sore joint every couple hours while awake for the first half day. Then 03-04 times per day for the first 48 hours. Put the ice in a plastic bag and place a towel between the bag of ice and your skin. °· Wear any splinting, casting, elastic bandage applications, or slings as instructed. °· Only take over-the-counter or prescription medicines for pain, discomfort, or fever as  directed by your caregiver. Do not use aspirin immediately after the injury unless instructed by your physician. Aspirin can cause increased bleeding and bruising of the tissues. °· If you were given crutches, continue to use them as instructed and do not resume weight bearing on the sore joint until instructed. °Persistent pain and inability to use the sore joint as directed for more than 2 to 3 days are warning signs indicating that you should see a caregiver for a follow-up visit as soon as possible. Initially, a hairline fracture (break in bone) may not be evident on X-rays. Persistent pain and swelling indicate that further evaluation, non-weight bearing or use of the joint (use of crutches or slings as instructed), or further X-rays are indicated. X-rays may sometimes not show a small fracture until a week or 10 days later. Make a follow-up appointment with your own caregiver or one to whom we have referred you. A radiologist (specialist in reading X-rays) may read your X-rays. Make sure you know how you are to obtain your X-ray results. Do not assume everything is normal if you do not hear from us. °SEEK MEDICAL CARE IF: °Bruising, swelling, or pain increases. °SEEK IMMEDIATE MEDICAL CARE IF:  °· Your fingers or toes are numb or blue. °· The pain is not responding to medications and continues to stay the same or get worse. °· The pain in your joint becomes severe. °· You develop a fever over 102° F (38.9° C). °· It becomes impossible to move or use the joint. °MAKE SURE YOU:  °·   Understand these instructions.  Will watch your condition.  Will get help right away if you are not doing well or get worse. Document Released: 12/02/2005 Document Revised: 02/24/2012 Document Reviewed: 07/20/2008 [facility] Patient Information 2014 [place].  Back Pain, Adult Back pain is very common. The pain often gets better over time. The cause of back pain is usually not dangerous. Most people can learn to manage  their back pain on their own.  HOME CARE   Stay active. Start with short walks on flat ground if you can. Try to walk farther each day.  Do not sit, drive, or stand in one place for more than 30 minutes. Do not stay in bed.  Do not avoid exercise or work. Activity can help your back heal faster.  Be careful when you bend or lift an object. Bend at your knees, keep the object close to you, and do not twist.  Sleep on a firm mattress. Lie on your side, and bend your knees. If you lie on your back, put a pillow under your knees.  Only take medicines as told by your doctor.  Put ice on the injured area.  Put ice in a plastic bag.  Place a towel between your skin and the bag.  Leave the ice on for 15-20 minutes, 03-04 times a day for the first 2 to 3 days. After that, you can switch between ice and heat packs.  Ask your doctor about back exercises or massage.  Avoid feeling anxious or stressed. Find good ways to deal with stress, such as exercise. GET HELP RIGHT AWAY IF:   Your pain does not go away with rest or medicine.  Your pain does not go away in 1 week.  You have new problems.  You do not feel well.  The pain spreads into your legs.  You cannot control when you poop (bowel movement) or pee (urinate).  Your arms or legs feel weak or lose feeling (numbness).  You feel sick to your stomach (nauseous) or throw up (vomit).  You have belly (abdominal) pain.  You feel like you may pass out (faint). MAKE SURE YOU:   Understand these instructions.  Will watch your condition.  Will get help right away if you are not doing well or get worse. Document Released: 05/20/2008 Document Revised: 02/24/2012 Document Reviewed: 04/22/2011 [facility] Patient Information 2014 [place].

## 2013-12-20 NOTE — ED Provider Notes (Signed)
CSN: [id number]     Arrival date & time 12/20/13  1336 History   First MD Initiated Contact with Patient 12/20/13 1444     Chief Complaint  Patient presents with  . Back Pain  . Knee Pain  . Chills  . Cough  . Nausea   (Consider location/radiation/quality/duration/timing/severity/associated sxs/prior Treatment) HPI Comments: Patient is a 58 year old female with history of HTN, COPD, CHF, reported MI in 1997, palpitations who presents to the ED today with multiple complaints. Initially she complains of palpitations, but then reports that this is chronic for her and there are no new changes. She has been evaluated for these in the ED and by cardiology. She complains of back pain that has been gradually worsening since Saturday. The pain began when she was shopping at [place]. It is a constant dull pain, worse with certain movements. She has not taken anything for the pain because she didn't have anything. She has not tried heat, ice, tylenol, or Advil because they won't work for her pain. The pain radiates into her right leg. Additionally she complains of 2 weeks of dry cough. The cough is worse at night. She has not taken any medications for this. She believes she has had a fever because she feels hot, but has not taken her temperature.   The history is provided by the patient. No language interpreter was used.    Past Medical History  Diagnosis Date  . Chest pain     a. s/p reported MI in 1997;  b. 09/2012 Cath: nl cors, nl LV ([place]);  c. 05/2012 Non-ischemic myoview.  . Hypertension   . Arthritis   . Problem with literacy   . Shortness of breath   . Anxiety   . Bronchitis   . Chronic headaches   . Obesity   . COPD (chronic obstructive pulmonary disease)   . Chronic diastolic CHF (congestive heart failure)     a. 12/2011 Echo: nl ef, Gr 1 DD;  b. 11/2012 Echo: EF 45-50%, no significant valvular abnormalities.  Marland Kitchen GERD (gastroesophageal reflux disease)   . Myocardial infarction 1997    self reported  . Hypothyroidism   . Asthma   . Chronic chest pain    Past Surgical History  Procedure Laterality Date  . Ectopic pregnancy surgery      right  . Nm myoview ltd  June 2013    no reversible ischemia  . Cardiac catheterization  October 2012    No coronary artery disease  . Cardiac catheterization  2012    normal coronary arteries  . Nm myoview ltd  2012, 2013    normal  . 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 Knee arthroscopy  08/13/13    right   Family History  Problem Relation Age of Onset  . Cancer Mother 16    lung ca  . Diabetes Mother   . Anxiety disorder Sister   . Cancer Brother 60    lung  . Colon cancer Neg Hx   . Pancreatic cancer Neg Hx   . Rectal cancer Neg Hx   . Stomach cancer Neg Hx    History  Substance Use Topics  . Smoking status: Former Smoker -- 1.00 packs/day for 3 years    Quit date: 12/03/2010  . Smokeless tobacco: Never Used  . Alcohol Use: No   OB History   Grav Para Term Preterm Abortions TAB SAB  Ect Mult Living                 Review of Systems  Constitutional: Positive for fever and chills.  Respiratory: Positive for shortness of breath.   Cardiovascular: Positive for chest pain and palpitations. Negative for leg swelling.  All other systems reviewed and are negative.    Allergies  Darvocet; Ibuprofen; Tramadol; Celebrex; Naproxen; and Penicillins  Home Medications   Current Outpatient Rx  Name  Route  Sig  Dispense  Refill  . albuterol (PROVENTIL HFA;VENTOLIN HFA) 108 (90 BASE) MCG/ACT inhaler   Inhalation   Inhale 2 puffs into the lungs every 6 (six) hours as needed for wheezing or shortness of breath.         Marland Kitchen aspirin EC 81 MG tablet   Oral   Take 81 mg by mouth daily.         . beclomethasone (QVAR) 80 MCG/ACT inhaler   Inhalation   Inhale 1 puff into the lungs 2 (two) times daily.   1 Inhaler   5    BP 115/82  Pulse 71  Temp(Src) 98.2 F (36.8 C)  (Oral)  Resp 18  SpO2 100% Physical Exam  Nursing note and vitals reviewed. Constitutional: She is oriented to person, place, and time. She appears well-developed and well-nourished.  Non-toxic appearance. She does not have a sickly appearance. She does not appear ill. No distress.  Patient appears well, in no distress. She lays comfortably in the bed.   HENT:  Head: Normocephalic and atraumatic.  Right Ear: External ear normal.  Left Ear: External ear normal.  Nose: Nose normal.  Mouth/Throat: Oropharynx is clear and moist.  Eyes: Conjunctivae are normal.  Neck: Normal range of motion.  Cardiovascular: Normal rate, regular rhythm and normal heart sounds.   Pulmonary/Chest: Effort normal and breath sounds normal. No stridor. No respiratory distress. She has no wheezes. She has no rales.  Abdominal: Soft. She exhibits no distension.  Musculoskeletal: Normal range of motion.  Patient is diffusely tender of her entire body. No leg swelling seen.  Strength 5/5 in lower extremities bilaterally.   Neurological: She is alert and oriented to person, place, and time. She has normal strength.  Skin: Skin is warm and dry. She is not diaphoretic. No erythema.  Psychiatric: She has a normal mood and affect. Her behavior is normal.    ED Course  Procedures (including critical care time) Labs Review Labs Reviewed  CBC WITH DIFFERENTIAL  BASIC METABOLIC PANEL  POCT I-STAT TROPONIN I   Imaging Review Dg Chest 2 View  12/20/2013   CLINICAL DATA:  Short of breath.  Cough  EXAM: CHEST  2 VIEW  COMPARISON:  11/18/2013  FINDINGS: The heart size and mediastinal contours are within normal limits. Both lungs are clear. The visualized skeletal structures are unremarkable.  IMPRESSION: No active cardiopulmonary disease.   Electronically Signed   By: Franchot Gallo M.D.   On: 12/20/2013 16:25    EKG Interpretation   None       MDM   1. Back pain   2. Arthralgia    Patient with back pain.  No  neurological deficits and normal neuro exam.  Patient can walk but states is painful.  No loss of bowel or bladder control.  No concern for cauda equina.  No fever, night sweats, weight loss, h/o cancer, IVDU.  RICE protocol and pain medicine indicated and discussed with patient.   Labs, CXR, and EKG all normal. Well's  criteria 0 for PE and negative CT angio was done in December. I referred to the drug database and did give the patient a small amount of hydrocodone to manage her pain. She understands to follow up with her PCP. Return instructions given. Vital signs stable for discharge. Discussed this case with Dr. Tamera Punt who agrees with plan. Patient / Family / Caregiver informed of clinical course, understand medical decision-making process, and agree with plan.     Elwyn Lade, PA-C 12/21/13 1605

## 2013-12-20 NOTE — ED Notes (Signed)
Pt c/o dry cough, back pain, bilat knee pain, nausea and chills x4days. Pt states she been hot but doesn't have a thermometer to check her temp with.

## 2013-12-21 NOTE — ED Provider Notes (Signed)
Medical screening examination/treatment/procedure(s) were performed by non-physician practitioner and as supervising physician I was immediately available for consultation/collaboration.  EKG Interpretation   None         Malvin Johns, MD 12/21/13 2303

## 2013-12-28 ENCOUNTER — Emergency Department (HOSPITAL_COMMUNITY)
Admission: EM | Admit: 2013-12-28 | Discharge: 2013-12-28 | Disposition: A | Discharge status: Home / Self Care | Payer: Medicaid Other | Attending: Emergency Medicine | Admitting: Emergency Medicine

## 2013-12-28 ENCOUNTER — Encounter (HOSPITAL_COMMUNITY): Payer: Self-pay | Admitting: Emergency Medicine

## 2013-12-28 ENCOUNTER — Telehealth: Payer: Self-pay | Admitting: Internal Medicine

## 2013-12-28 ENCOUNTER — Emergency Department (HOSPITAL_COMMUNITY): Payer: Medicaid Other

## 2013-12-28 DIAGNOSIS — Z95818 Presence of other cardiac implants and grafts: Secondary | ICD-10-CM | POA: Insufficient documentation

## 2013-12-28 DIAGNOSIS — J40 Bronchitis, not specified as acute or chronic: Secondary | ICD-10-CM

## 2013-12-28 DIAGNOSIS — M129 Arthropathy, unspecified: Secondary | ICD-10-CM | POA: Insufficient documentation

## 2013-12-28 DIAGNOSIS — R5383 Other fatigue: Secondary | ICD-10-CM

## 2013-12-28 DIAGNOSIS — Z8719 Personal history of other diseases of the digestive system: Secondary | ICD-10-CM | POA: Insufficient documentation

## 2013-12-28 DIAGNOSIS — Z880 Allergy status to penicillin: Secondary | ICD-10-CM | POA: Insufficient documentation

## 2013-12-28 DIAGNOSIS — G8929 Other chronic pain: Secondary | ICD-10-CM | POA: Insufficient documentation

## 2013-12-28 DIAGNOSIS — J45901 Unspecified asthma with (acute) exacerbation: Principal | ICD-10-CM

## 2013-12-28 DIAGNOSIS — Z559 Problems related to education and literacy, unspecified: Secondary | ICD-10-CM | POA: Insufficient documentation

## 2013-12-28 DIAGNOSIS — Z79899 Other long term (current) drug therapy: Secondary | ICD-10-CM | POA: Insufficient documentation

## 2013-12-28 DIAGNOSIS — R5381 Other malaise: Secondary | ICD-10-CM | POA: Insufficient documentation

## 2013-12-28 DIAGNOSIS — I252 Old myocardial infarction: Secondary | ICD-10-CM | POA: Insufficient documentation

## 2013-12-28 DIAGNOSIS — E669 Obesity, unspecified: Secondary | ICD-10-CM | POA: Insufficient documentation

## 2013-12-28 DIAGNOSIS — Z8659 Personal history of other mental and behavioral disorders: Secondary | ICD-10-CM | POA: Insufficient documentation

## 2013-12-28 DIAGNOSIS — IMO0002 Reserved for concepts with insufficient information to code with codable children: Secondary | ICD-10-CM | POA: Insufficient documentation

## 2013-12-28 DIAGNOSIS — Z862 Personal history of diseases of the blood and blood-forming organs and certain disorders involving the immune mechanism: Secondary | ICD-10-CM | POA: Insufficient documentation

## 2013-12-28 DIAGNOSIS — I10 Essential (primary) hypertension: Secondary | ICD-10-CM | POA: Insufficient documentation

## 2013-12-28 DIAGNOSIS — Z7982 Long term (current) use of aspirin: Secondary | ICD-10-CM | POA: Insufficient documentation

## 2013-12-28 DIAGNOSIS — R0789 Other chest pain: Secondary | ICD-10-CM | POA: Insufficient documentation

## 2013-12-28 DIAGNOSIS — I5032 Chronic diastolic (congestive) heart failure: Secondary | ICD-10-CM | POA: Insufficient documentation

## 2013-12-28 DIAGNOSIS — Z87891 Personal history of nicotine dependence: Secondary | ICD-10-CM | POA: Insufficient documentation

## 2013-12-28 DIAGNOSIS — Z8639 Personal history of other endocrine, nutritional and metabolic disease: Secondary | ICD-10-CM | POA: Insufficient documentation

## 2013-12-28 DIAGNOSIS — J441 Chronic obstructive pulmonary disease with (acute) exacerbation: Secondary | ICD-10-CM | POA: Insufficient documentation

## 2013-12-28 LAB — BASIC METABOLIC PANEL
BUN: 11 mg/dL (ref 6–23)
CO2: 25 mEq/L (ref 19–32)
Calcium: 8.9 mg/dL (ref 8.4–10.5)
Chloride: 102 mEq/L (ref 96–112)
Creatinine, Ser: 0.73 mg/dL (ref 0.50–1.10)
GFR calc Af Amer: >90 mL/min (ref >90)
Glucose, Bld: 89 mg/dL (ref 70–99)
POTASSIUM: 3.9 meq/L (ref 3.7–5.3)
SODIUM: 140 meq/L (ref 137–147)

## 2013-12-28 LAB — CBC WITH DIFFERENTIAL/PLATELET
BASOS PCT: 1 % (ref 0–1)
Basophils Absolute: 0.1 10*3/uL (ref 0.0–0.1)
Eosinophils Absolute: 0.2 10*3/uL (ref 0.0–0.7)
Eosinophils Relative: 3 % (ref 0–5)
HCT: 39.9 % (ref 36.0–46.0)
HEMOGLOBIN: 13.4 g/dL (ref 12.0–15.0)
LYMPHS ABS: 2.3 10*3/uL (ref 0.7–4.0)
Lymphocytes Relative: 28 % (ref 12–46)
MCH: 28.8 pg (ref 26.0–34.0)
MCHC: 33.6 g/dL (ref 30.0–36.0)
MCV: 85.6 fL (ref 78.0–100.0)
Monocytes Absolute: 0.7 10*3/uL (ref 0.1–1.0)
Monocytes Relative: 8 % (ref 3–12)
NEUTROS PCT: 60 % (ref 43–77)
Neutro Abs: 4.9 10*3/uL (ref 1.7–7.7)
Platelets: 296 10*3/uL (ref 150–400)
RBC: 4.66 MIL/uL (ref 3.87–5.11)
RDW: 15.1 % (ref 11.5–15.5)
WBC: 8.2 10*3/uL (ref 4.0–10.5)

## 2013-12-28 LAB — TROPONIN I

## 2013-12-28 MED ORDER — AZITHROMYCIN 250 MG PO TABS: 250.0000 mg | ORAL_TABLET | Freq: Every day | ORAL | Status: DC

## 2013-12-28 MED ORDER — HYDROCOD POLST-CHLORPHEN POLST 10-8 MG/5ML PO LQCR: 5.0000 mL | Freq: Two times a day (BID) | ORAL | Status: DC | PRN

## 2013-12-28 MED ORDER — FENTANYL CITRATE 0.05 MG/ML IJ SOLN
100.0000 ug | Freq: Once | INTRAMUSCULAR | Status: AC
  Administered 2013-12-28: 100 ug via INTRAVENOUS
  Filled 2013-12-28: qty 2

## 2013-12-28 MED ORDER — HYDROCOD POLST-CHLORPHEN POLST 10-8 MG/5ML PO LQCR
5.0000 mL | Freq: Once | ORAL | Status: AC
  Administered 2013-12-28: 5 mL via ORAL
  Filled 2013-12-28: qty 5

## 2013-12-28 NOTE — ED Notes (Signed)
Patient transported to CT 

## 2013-12-28 NOTE — Telephone Encounter (Signed)
New message    C/O heart flutter, back & knee are hurting.   Has appt on 1/22.

## 2013-12-28 NOTE — ED Provider Notes (Signed)
CSN: [id number]     Arrival date & time 12/28/13  1839 History   First MD Initiated Contact with Patient 12/28/13 1841     Chief Complaint  Patient presents with  . Shortness of Breath   (Consider location/radiation/quality/duration/timing/severity/associated sxs/prior Treatment) Patient is a 58 y.o. female presenting with shortness of breath. The history is provided by the patient. No language interpreter was used.  Shortness of Breath Severity:  Mild Context: URI   Associated symptoms: cough and wheezing   Associated symptoms: no abdominal pain, no fever and no vomiting   Associated symptoms comment:  She reports cough for the past 2 weeks associated with chest pain with cough. She feels short of breath. No vomiting. She has a history of COPD, asthma and uses an inhaler at home. She states she continues to cough despite use of Albuterol and feels generally achy and ill.    Past Medical History  Diagnosis Date  . Chest pain     a. s/p reported MI in 1997;  b. 09/2012 Cath: nl cors, nl LV ([place]);  c. 05/2012 Non-ischemic myoview.  . Hypertension   . Arthritis   . Problem with literacy   . Shortness of breath   . Anxiety   . Bronchitis   . Chronic headaches   . Obesity   . COPD (chronic obstructive pulmonary disease)   . Chronic diastolic CHF (congestive heart failure)     a. 12/2011 Echo: nl ef, Gr 1 DD;  b. 11/2012 Echo: EF 45-50%, no significant valvular abnormalities.  Marland Kitchen GERD (gastroesophageal reflux disease)   . Myocardial infarction 1997    self reported  . Hypothyroidism   . Asthma   . Chronic chest pain    Past Surgical History  Procedure Laterality Date  . Ectopic pregnancy surgery      right  . Nm myoview ltd  June 2013    no reversible ischemia  . Cardiac catheterization  October 2012    No coronary artery disease  . Cardiac catheterization  2012    normal coronary arteries  . Nm myoview ltd  2012, 2013    normal  . 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 Knee arthroscopy  08/13/13    right   Family History  Problem Relation Age of Onset  . Cancer Mother 54    lung ca  . Diabetes Mother   . Anxiety disorder Sister   . Cancer Brother 60    lung  . Colon cancer Neg Hx   . Pancreatic cancer Neg Hx   . Rectal cancer Neg Hx   . Stomach cancer Neg Hx    History  Substance Use Topics  . Smoking status: Former Smoker -- 1.00 packs/day for 3 years    Quit date: 12/03/2010  . Smokeless tobacco: Never Used  . Alcohol Use: No   OB History   Grav Para Term Preterm Abortions TAB SAB Ect Mult Living                 Review of Systems  Constitutional: Positive for fatigue. Negative for fever and chills.  HENT: Negative.  Negative for congestion.   Respiratory: Positive for cough, chest tightness, shortness of breath and wheezing.   Cardiovascular: Negative.  Negative for leg swelling.  Gastrointestinal: Negative.  Negative for vomiting and abdominal pain.  Genitourinary: Negative.   Musculoskeletal: Negative.   Skin: Negative.   Neurological: Negative.  Negative for weakness and light-headedness.  Psychiatric/Behavioral: Negative for confusion.    Allergies  Darvocet; Ibuprofen; Tramadol; Celebrex; Naproxen; and Penicillins  Home Medications   Current Outpatient Rx  Name  Route  Sig  Dispense  Refill  . albuterol (PROVENTIL HFA;VENTOLIN HFA) 108 (90 BASE) MCG/ACT inhaler   Inhalation   Inhale 2 puffs into the lungs every 6 (six) hours as needed for wheezing or shortness of breath.         Marland Kitchen aspirin EC 81 MG tablet   Oral   Take 81 mg by mouth daily.         . beclomethasone (QVAR) 80 MCG/ACT inhaler   Inhalation   Inhale 1 puff into the lungs 2 (two) times daily.   1 Inhaler   5   . HYDROcodone-acetaminophen (NORCO/VICODIN) 5-325 MG per tablet   Oral   Take 1-2 tablets by mouth every 6 (six) hours as needed.   6 tablet   0   . promethazine (PHENERGAN) 25 MG tablet   Oral    Take 1 tablet (25 mg total) by mouth every 6 (six) hours as needed for nausea or vomiting.   12 tablet   0    BP 118/69  Pulse 56  Temp(Src) 98 F (36.7 C) (Oral)  Resp 18  SpO2 100% Physical Exam  Constitutional: She is oriented to person, place, and time. She appears well-developed and well-nourished.  HENT:  Head: Normocephalic.  Neck: Normal range of motion. Neck supple.  Cardiovascular: Normal rate and regular rhythm.   Pulmonary/Chest: Effort normal and breath sounds normal. No respiratory distress. She has no wheezes. She exhibits no tenderness.  Abdominal: Soft. Bowel sounds are normal. There is no tenderness. There is no rebound and no guarding.  Musculoskeletal: Normal range of motion.  Neurological: She is alert and oriented to person, place, and time.  Skin: Skin is warm and dry. No rash noted.  Psychiatric: She has a normal mood and affect.    ED Course  Procedures (including critical care time) Labs Review Labs Reviewed  TROPONIN I  BASIC METABOLIC PANEL  CBC WITH DIFFERENTIAL   Imaging Review Dg Chest 2 View  12/28/2013   CLINICAL DATA:  Shortness of breath  EXAM: CHEST  2 VIEW  COMPARISON:  12/20/2013  FINDINGS: The heart size and mediastinal contours are within normal limits. Both lungs are clear. The visualized skeletal structures are unremarkable.  IMPRESSION: No active cardiopulmonary disease.   Electronically Signed   By: Margaree Mackintosh M.D.   On: 12/28/2013 19:49    EKG Interpretation    Date/Time:  Tuesday December 28 2013 18:45:15 EST Ventricular Rate:  63 PR Interval:  146 QRS Duration: 89 QT Interval:  427 QTC Calculation: 437 R Axis:   52 Text Interpretation:  Sinus rhythm Low voltage, extremity and precordial leads Nonspecific ST and T wave abnormality Baseline wander in lead(s) II III aVF V3 V5 Confirmed by POLLINA  MD, CHRISTOPHER ([id number]) on 12/28/2013 6:53:34 PM            MDM  No diagnosis found. 1. Bronchitis 2. H/o asthma,  COPD  She is feeling some better with Tussionex. Clear chest x-ray, normal lab studies - reassuring. Will treat bronchitis with abx due to duration of symptoms, prescribe Tussionex for symptomatic relief.     Dewaine Oats, PA-C 12/28/13 2020

## 2013-12-28 NOTE — Telephone Encounter (Signed)
Spoke with patient she is c/o knee and back pain.  Also says her heart is racing off and on and wanted to know if she could move her appointment up from 1/22.  I let her know if we had a cancellation we would call her  But as far as the back and knee pain she would need to call her PCP

## 2013-12-28 NOTE — ED Provider Notes (Signed)
Medical screening examination/treatment/procedure(s) were performed by non-physician practitioner and as supervising physician I was immediately available for consultation/collaboration.  EKG Interpretation    Date/Time:  Tuesday December 28 2013 18:45:15 EST Ventricular Rate:  63 PR Interval:  146 QRS Duration: 89 QT Interval:  427 QTC Calculation: 437 R Axis:   52 Text Interpretation:  Sinus rhythm Low voltage, extremity and precordial leads Nonspecific ST and T wave abnormality Baseline wander in lead(s) II III aVF V3 V5 Confirmed by Corinthia Helmers  MD, Esthefany Herrig ([id number]) on 12/28/2013 6:53:34 PM             Orpah Greek, MD 12/28/13 2023

## 2013-12-28 NOTE — ED Notes (Signed)
MD at bedside. 

## 2013-12-28 NOTE — ED Notes (Signed)
Per EMS pt started having SOB two days ago, felt heart racing, HR 60-70, BP 130/81, 96-99 Spo2.

## 2013-12-28 NOTE — Discharge Instructions (Signed)
Bronchitis °Bronchitis is swelling (inflammation) of the air tubes leading to your lungs (bronchi). This causes mucus and a cough. If the swelling gets bad, you may have trouble breathing. °HOME CARE  °· Rest. °· Drink enough fluids to keep your pee (urine) clear or pale yellow (unless you have a condition where you have to watch how much you drink). °· Only take medicine as told by your doctor. If you were given antibiotic medicines, finish them even if you start to feel better. °· Avoid smoke, irritating chemicals, and strong smells. These make the problem worse. Quit smoking if you smoke. This helps your lungs heal faster. °· Use a cool mist humidifier. Change the water in the humidifier every day. You can also sit in the bathroom with hot shower running for 5 10 minutes. Keep the door closed. °· See your health care provider as told. °· Wash your hands often. °GET HELP IF: °Your problems do not get better after 1 week. °GET HELP RIGHT AWAY IF:  °· Your fever gets worse. °· You have chills. °· Your chest hurts. °· Your problems breathing get worse. °· You have blood in your mucus. °· You pass out (faint). °· You feel lightheaded. °· You have a bad headache. °· You throw up (vomit) again and again. °MAKE SURE YOU: °· Understand these instructions. °· Will watch your condition. °· Will get help right away if you are not doing well or get worse. °Document Released: 05/20/2008 Document Revised: 09/22/2013 Document Reviewed: 07/27/2013 °[employer]® Patient Information ©2014 [employer]. ° °

## 2013-12-29 ENCOUNTER — Telehealth: Payer: Self-pay | Admitting: Family Medicine

## 2013-12-29 LAB — INFLUENZA PANEL BY PCR (TYPE A & B)
H1N1 flu by pcr: NOT DETECTED
Influenza A By PCR: NEGATIVE
Influenza B By PCR: NEGATIVE

## 2013-12-29 MED ORDER — LEVOTHYROXINE SODIUM 50 MCG PO TABS: 50.0000 ug | ORAL_TABLET | Freq: Every day | ORAL | Status: DC

## 2013-12-29 NOTE — Telephone Encounter (Signed)
I called to check on patient since she had not follow up for her hypothyroidism, she stated she had been compliant with her Synthroid till couple of days ago when she ran out. She could not tell me the dose of her Synthroid,from record she is current on 50 mcg, I instructed her that I will refill her medication now and advised to pick up medication today. She verbalized understanding.

## 2013-12-30 ENCOUNTER — Emergency Department (HOSPITAL_COMMUNITY): Payer: Medicaid Other

## 2013-12-30 ENCOUNTER — Encounter (HOSPITAL_COMMUNITY): Payer: Self-pay | Admitting: Emergency Medicine

## 2013-12-30 ENCOUNTER — Emergency Department (HOSPITAL_COMMUNITY)
Admission: EM | Admit: 2013-12-30 | Discharge: 2013-12-30 | Disposition: A | Discharge status: Home / Self Care | Payer: Medicaid Other | Attending: Emergency Medicine | Admitting: Emergency Medicine

## 2013-12-30 ENCOUNTER — Emergency Department (HOSPITAL_COMMUNITY)
Admission: EM | Admit: 2013-12-30 | Discharge: 2013-12-31 | Disposition: A | Discharge status: Home / Self Care | Payer: Medicaid Other | Source: Home / Self Care | Attending: Emergency Medicine | Admitting: Emergency Medicine

## 2013-12-30 DIAGNOSIS — M79609 Pain in unspecified limb: Secondary | ICD-10-CM

## 2013-12-30 DIAGNOSIS — I252 Old myocardial infarction: Secondary | ICD-10-CM | POA: Insufficient documentation

## 2013-12-30 DIAGNOSIS — J4489 Other specified chronic obstructive pulmonary disease: Secondary | ICD-10-CM | POA: Insufficient documentation

## 2013-12-30 DIAGNOSIS — J449 Chronic obstructive pulmonary disease, unspecified: Secondary | ICD-10-CM | POA: Insufficient documentation

## 2013-12-30 DIAGNOSIS — Z559 Problems related to education and literacy, unspecified: Secondary | ICD-10-CM | POA: Insufficient documentation

## 2013-12-30 DIAGNOSIS — Z8659 Personal history of other mental and behavioral disorders: Secondary | ICD-10-CM

## 2013-12-30 DIAGNOSIS — Z880 Allergy status to penicillin: Secondary | ICD-10-CM

## 2013-12-30 DIAGNOSIS — IMO0002 Reserved for concepts with insufficient information to code with codable children: Secondary | ICD-10-CM | POA: Insufficient documentation

## 2013-12-30 DIAGNOSIS — N390 Urinary tract infection, site not specified: Secondary | ICD-10-CM | POA: Insufficient documentation

## 2013-12-30 DIAGNOSIS — M129 Arthropathy, unspecified: Secondary | ICD-10-CM | POA: Insufficient documentation

## 2013-12-30 DIAGNOSIS — G8929 Other chronic pain: Secondary | ICD-10-CM | POA: Insufficient documentation

## 2013-12-30 DIAGNOSIS — E669 Obesity, unspecified: Secondary | ICD-10-CM | POA: Insufficient documentation

## 2013-12-30 DIAGNOSIS — Z79899 Other long term (current) drug therapy: Secondary | ICD-10-CM | POA: Insufficient documentation

## 2013-12-30 DIAGNOSIS — Z8719 Personal history of other diseases of the digestive system: Secondary | ICD-10-CM | POA: Insufficient documentation

## 2013-12-30 DIAGNOSIS — Y939 Activity, unspecified: Secondary | ICD-10-CM | POA: Insufficient documentation

## 2013-12-30 DIAGNOSIS — R296 Repeated falls: Secondary | ICD-10-CM | POA: Insufficient documentation

## 2013-12-30 DIAGNOSIS — Z792 Long term (current) use of antibiotics: Secondary | ICD-10-CM | POA: Insufficient documentation

## 2013-12-30 DIAGNOSIS — I10 Essential (primary) hypertension: Secondary | ICD-10-CM

## 2013-12-30 DIAGNOSIS — Z7982 Long term (current) use of aspirin: Secondary | ICD-10-CM

## 2013-12-30 DIAGNOSIS — S99919A Unspecified injury of unspecified ankle, initial encounter: Principal | ICD-10-CM

## 2013-12-30 DIAGNOSIS — Z87891 Personal history of nicotine dependence: Secondary | ICD-10-CM | POA: Insufficient documentation

## 2013-12-30 DIAGNOSIS — I5032 Chronic diastolic (congestive) heart failure: Secondary | ICD-10-CM | POA: Insufficient documentation

## 2013-12-30 DIAGNOSIS — Z9889 Other specified postprocedural states: Secondary | ICD-10-CM

## 2013-12-30 DIAGNOSIS — E039 Hypothyroidism, unspecified: Secondary | ICD-10-CM

## 2013-12-30 DIAGNOSIS — Y9229 Other specified public building as the place of occurrence of the external cause: Secondary | ICD-10-CM | POA: Insufficient documentation

## 2013-12-30 DIAGNOSIS — W19XXXA Unspecified fall, initial encounter: Secondary | ICD-10-CM

## 2013-12-30 DIAGNOSIS — R079 Chest pain, unspecified: Secondary | ICD-10-CM | POA: Insufficient documentation

## 2013-12-30 DIAGNOSIS — R112 Nausea with vomiting, unspecified: Secondary | ICD-10-CM

## 2013-12-30 DIAGNOSIS — S99929A Unspecified injury of unspecified foot, initial encounter: Principal | ICD-10-CM

## 2013-12-30 DIAGNOSIS — S8991XA Unspecified injury of right lower leg, initial encounter: Secondary | ICD-10-CM

## 2013-12-30 DIAGNOSIS — S8990XA Unspecified injury of unspecified lower leg, initial encounter: Secondary | ICD-10-CM | POA: Insufficient documentation

## 2013-12-30 MED ORDER — ACETAMINOPHEN 500 MG PO TABS: 1000.0000 mg | ORAL_TABLET | Freq: Four times a day (QID) | ORAL | Status: DC | PRN

## 2013-12-30 MED ORDER — HYDROCODONE-ACETAMINOPHEN 5-325 MG PO TABS
1.0000 | ORAL_TABLET | Freq: Once | ORAL | Status: AC
  Administered 2013-12-31: 1 via ORAL
  Filled 2013-12-30: qty 1

## 2013-12-30 MED ORDER — METHOCARBAMOL 500 MG PO TABS: 500.0000 mg | ORAL_TABLET | Freq: Two times a day (BID) | ORAL | Status: DC | PRN

## 2013-12-30 MED ORDER — ONDANSETRON 8 MG PO TBDP
8.0000 mg | ORAL_TABLET | Freq: Once | ORAL | Status: AC
  Administered 2013-12-30: 8 mg via ORAL
  Filled 2013-12-30: qty 1

## 2013-12-30 NOTE — ED Provider Notes (Signed)
CSN: [id number]     Arrival date & time 12/30/13  2301 History   First MD Initiated Contact with Patient 12/30/13 2318     Chief Complaint  Patient presents with  . Emesis  . Abdominal Pain  . Arm Pain   (Consider location/radiation/quality/duration/timing/severity/associated sxs/prior Treatment) HPI Comments: Patient presents with complaint of generalized abdominal pain that began this morning after she woke up. Patient states that she has had frequent vomiting and has been unable to keep down solids or liquids. She also has had some low back pain. She denies fever, diarrhea, urinary symptoms. No treatments other than Tylenol prior to arrival.  Of note, patient was seen at [facility] emergency department today with complaint of right knee and ankle pain after a fall yesterday. She had negative imaging at that time. She was prescribed Tylenol. Patient did not bring any of these symptoms to the attention of her provider today. When asked about this, patient began to say that her abdominal pain began afterwards, however then stated she did not because her ride had to go and she had a limited amount of time in emergency department. She did not mention these current symptoms to triage nurse.   The onset of this condition was acute. The course is constant. Aggravating factors: none. Alleviating factors: none.    The history is provided by the patient.    Past Medical History  Diagnosis Date  . Chest pain     a. s/p reported MI in 1997;  b. 09/2012 Cath: nl cors, nl LV ([place]);  c. 05/2012 Non-ischemic myoview.  . Hypertension   . Arthritis   . Problem with literacy   . Shortness of breath   . Anxiety   . Bronchitis   . Chronic headaches   . Obesity   . COPD (chronic obstructive pulmonary disease)   . Chronic diastolic CHF (congestive heart failure)     a. 12/2011 Echo: nl ef, Gr 1 DD;  b. 11/2012 Echo: EF 45-50%, no significant valvular abnormalities.  Marland Kitchen GERD (gastroesophageal reflux  disease)   . Myocardial infarction 1997    self reported  . Hypothyroidism   . Asthma   . Chronic chest pain    Past Surgical History  Procedure Laterality Date  . Ectopic pregnancy surgery      right  . Nm myoview ltd  June 2013    no reversible ischemia  . Cardiac catheterization  October 2012    No coronary artery disease  . Cardiac catheterization  2012    normal coronary arteries  . Nm myoview ltd  2012, 2013    normal  . 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 Knee arthroscopy  08/13/13    right   Family History  Problem Relation Age of Onset  . Cancer Mother 28    lung ca  . Diabetes Mother   . Anxiety disorder Sister   . Cancer Brother 60    lung  . Colon cancer Neg Hx   . Pancreatic cancer Neg Hx   . Rectal cancer Neg Hx   . Stomach cancer Neg Hx    History  Substance Use Topics  . Smoking status: Former Smoker -- 1.00 packs/day for 3 years    Quit date: 12/03/2010  . Smokeless tobacco: Never Used  . Alcohol Use: No   OB History   Grav Para Term Preterm Abortions TAB SAB Ect Mult Living  Review of Systems  Constitutional: Negative for fever.  HENT: Negative for rhinorrhea and sore throat.   Eyes: Negative for redness.  Respiratory: Negative for cough.   Cardiovascular: Negative for chest pain.  Gastrointestinal: Positive for nausea, vomiting (streaks of blood, intermittent) and abdominal pain. Negative for diarrhea.  Genitourinary: Negative for dysuria.  Musculoskeletal: Negative for myalgias.  Skin: Negative for rash.  Neurological: Negative for headaches.    Allergies  Darvocet; Ibuprofen; Tramadol; Celebrex; Naproxen; and Penicillins  Home Medications   Current Outpatient Rx  Name  Route  Sig  Dispense  Refill  . acetaminophen (TYLENOL) 500 MG tablet   Oral   Take 2 tablets (1,000 mg total) by mouth every 6 (six) hours as needed.   30 tablet   0   . albuterol (PROVENTIL HFA;VENTOLIN  HFA) 108 (90 BASE) MCG/ACT inhaler   Inhalation   Inhale 2 puffs into the lungs every 6 (six) hours as needed for wheezing or shortness of breath.         Marland Kitchen aspirin EC 81 MG tablet   Oral   Take 81 mg by mouth daily.         Marland Kitchen azithromycin (ZITHROMAX) 250 MG tablet   Oral   Take 1 tablet (250 mg total) by mouth daily. Take first 2 tablets together, then 1 every day until finished.   6 tablet   0   . beclomethasone (QVAR) 80 MCG/ACT inhaler   Inhalation   Inhale 1 puff into the lungs 2 (two) times daily.   1 Inhaler   5   . chlorpheniramine-HYDROcodone (TUSSIONEX PENNKINETIC ER) 10-8 MG/5ML LQCR   Oral   Take 5 mLs by mouth every 12 (twelve) hours as needed for cough.   70 mL   0   . levothyroxine (SYNTHROID, LEVOTHROID) 50 MCG tablet   Oral   Take 1 tablet (50 mcg total) by mouth daily.   90 tablet   1   . methocarbamol (ROBAXIN) 500 MG tablet   Oral   Take 1 tablet (500 mg total) by mouth 2 (two) times daily as needed for muscle spasms.   20 tablet   0   . promethazine (PHENERGAN) 25 MG tablet   Oral   Take 1 tablet (25 mg total) by mouth every 6 (six) hours as needed for nausea or vomiting.   12 tablet   0    BP 134/85  Pulse 82  Temp(Src) 98 F (36.7 C) (Oral)  Resp 18  SpO2 98% Physical Exam  Nursing note and vitals reviewed. Constitutional: She appears well-developed and well-nourished.  HENT:  Head: Normocephalic and atraumatic.  Eyes: Conjunctivae are normal. Right eye exhibits no discharge. Left eye exhibits no discharge.  Neck: Normal range of motion. Neck supple.  Cardiovascular: Normal rate, regular rhythm and normal heart sounds.   Pulmonary/Chest: Effort normal and breath sounds normal.  Abdominal: Soft. Bowel sounds are normal. She exhibits no distension. There is tenderness (mild, generalized). There is no rebound and no guarding.  Neurological: She is alert.  Skin: Skin is warm and dry.  Psychiatric: She has a normal mood and affect.     ED Course  Procedures (including critical care time) Labs Review Labs Reviewed  CBC WITH DIFFERENTIAL - Abnormal; Notable for the following:    Hemoglobin 11.9 (*)    All other components within normal limits  URINALYSIS, ROUTINE W REFLEX MICROSCOPIC - Abnormal; Notable for the following:    APPearance CLOUDY (*)  Nitrite POSITIVE (*)    Leukocytes, UA TRACE (*)    All other components within normal limits  URINE MICROSCOPIC-ADD ON - Abnormal; Notable for the following:    Bacteria, UA MANY (*)    All other components within normal limits  URINE CULTURE  COMPREHENSIVE METABOLIC PANEL  LIPASE, BLOOD   Imaging Review Dg Ankle Complete Right  12/30/2013   CLINICAL DATA:  Fall last night.  Right knee and ankle pain.  EXAM: RIGHT ANKLE - COMPLETE 3+ VIEW  COMPARISON:  Right foot radiographs 07/30/2013.  FINDINGS: Mild contour deformity of the distal fibula may be related to prior trauma. There is no evidence of acute fracture or dislocation. No focal osseous lesion is identified. No soft tissue abnormality is seen.  IMPRESSION: No acute osseous abnormality.   Electronically Signed   By: Logan Bores   On: 12/30/2013 15:36   Dg Knee Complete 4 Views Right  12/30/2013   CLINICAL DATA:  Fall last night.  Right knee pain.  EXAM: RIGHT KNEE - COMPLETE 4+ VIEW  COMPARISON:  11/25/2013  FINDINGS: There is no evidence of acute fracture or dislocation. No knee joint effusion is identified. Minimal marginal spurring is again noted involving the medial and lateral compartments. No soft tissue abnormality is identified.  IMPRESSION: No evidence of acute osseous abnormality.   Electronically Signed   By: Logan Bores   On: 12/30/2013 15:38    EKG Interpretation   None      11:27 PM Patient seen and examined. Work-up initiated. Medications ordered.   Vital signs reviewed and are as follows: Filed Vitals:   12/30/13 2305  BP: 134/85  Pulse: 82  Temp: 98 F (36.7 C)  Resp: 18   1:00 AM  Pt informed of results. Will d/c to home with medication for UTI.   The patient was urged to return to the Emergency Department immediately with worsening of current symptoms, worsening abdominal pain, persistent vomiting, blood noted in stools, fever, or any other concerns. The patient verbalized understanding.    MDM   1. Urinary tract infection    Patient with generalized abd pain, reassuring labs, UA with UTI. I am a bit concerned about this patient's story given the fact she was at [facility] for other pain related complaint today and did not mention her current symptoms which began this AM. I will avoid narcotics based on this and patient can continue tylenol for pain control. Previous E. Coli UTI 12/14 was pan-sensitive.     Carlisle Cater, PA-C 12/31/13 [phone]

## 2013-12-30 NOTE — ED Provider Notes (Signed)
CSN: [id number]     Arrival date & time 12/30/13  1414 History   First MD Initiated Contact with Patient 12/30/13 1510     Chief Complaint  Patient presents with  . Fall  . Knee Pain   (Consider location/radiation/quality/duration/timing/severity/associated sxs/prior Treatment) HPI Comments: Patient is a 58 year old female who presents with right knee pain that started today after she fell and in [place] today. The pain is aching and severe with radiation down her leg. Palpation and weight bearing makes the pain worse. No alleviating factors. Patient did not try anything for pain. Patient denies any other injury. Patient reports associated swelling of her right knee.    Past Medical History  Diagnosis Date  . Chest pain     a. s/p reported MI in 1997;  b. 09/2012 Cath: nl cors, nl LV ([place]);  c. 05/2012 Non-ischemic myoview.  . Hypertension   . Arthritis   . Problem with literacy   . Shortness of breath   . Anxiety   . Bronchitis   . Chronic headaches   . Obesity   . COPD (chronic obstructive pulmonary disease)   . Chronic diastolic CHF (congestive heart failure)     a. 12/2011 Echo: nl ef, Gr 1 DD;  b. 11/2012 Echo: EF 45-50%, no significant valvular abnormalities.  Marland Kitchen GERD (gastroesophageal reflux disease)   . Myocardial infarction 1997    self reported  . Hypothyroidism   . Asthma   . Chronic chest pain    Past Surgical History  Procedure Laterality Date  . Ectopic pregnancy surgery      right  . Nm myoview ltd  June 2013    no reversible ischemia  . Cardiac catheterization  October 2012    No coronary artery disease  . Cardiac catheterization  2012    normal coronary arteries  . Nm myoview ltd  2012, 2013    normal  . 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 Knee arthroscopy  08/13/13    right   Family History  Problem Relation Age of Onset  . Cancer Mother 3    lung ca  . Diabetes Mother   . Anxiety disorder Sister   .  Cancer Brother 60    lung  . Colon cancer Neg Hx   . Pancreatic cancer Neg Hx   . Rectal cancer Neg Hx   . Stomach cancer Neg Hx    History  Substance Use Topics  . Smoking status: Former Smoker -- 1.00 packs/day for 3 years    Quit date: 12/03/2010  . Smokeless tobacco: Never Used  . Alcohol Use: No   OB History   Grav Para Term Preterm Abortions TAB SAB Ect Mult Living                 Review of Systems  Constitutional: Negative for fever, chills and fatigue.  HENT: Negative for trouble swallowing.   Eyes: Negative for visual disturbance.  Respiratory: Negative for shortness of breath.   Cardiovascular: Negative for chest pain and palpitations.  Gastrointestinal: Negative for nausea, vomiting, abdominal pain and diarrhea.  Genitourinary: Negative for dysuria and difficulty urinating.  Musculoskeletal: Positive for arthralgias and joint swelling. Negative for neck pain.  Skin: Negative for color change.  Neurological: Negative for dizziness and weakness.  Psychiatric/Behavioral: Negative for dysphoric mood.    Allergies  Darvocet; Ibuprofen; Tramadol; Celebrex; Naproxen; and Penicillins  Home Medications  Current Outpatient Rx  Name  Route  Sig  Dispense  Refill  . albuterol (PROVENTIL HFA;VENTOLIN HFA) 108 (90 BASE) MCG/ACT inhaler   Inhalation   Inhale 2 puffs into the lungs every 6 (six) hours as needed for wheezing or shortness of breath.         Marland Kitchen aspirin EC 81 MG tablet   Oral   Take 81 mg by mouth daily.         Marland Kitchen azithromycin (ZITHROMAX) 250 MG tablet   Oral   Take 1 tablet (250 mg total) by mouth daily. Take first 2 tablets together, then 1 every day until finished.   6 tablet   0   . beclomethasone (QVAR) 80 MCG/ACT inhaler   Inhalation   Inhale 1 puff into the lungs 2 (two) times daily.   1 Inhaler   5   . promethazine (PHENERGAN) 25 MG tablet   Oral   Take 1 tablet (25 mg total) by mouth every 6 (six) hours as needed for nausea or  vomiting.   12 tablet   0   . chlorpheniramine-HYDROcodone (TUSSIONEX PENNKINETIC ER) 10-8 MG/5ML LQCR   Oral   Take 5 mLs by mouth every 12 (twelve) hours as needed for cough.   70 mL   0   . levothyroxine (SYNTHROID, LEVOTHROID) 50 MCG tablet   Oral   Take 1 tablet (50 mcg total) by mouth daily.   90 tablet   1    BP 115/75  Pulse 86  Temp(Src) 98.2 F (36.8 C) (Oral)  Resp 18  SpO2 100% Physical Exam  Nursing note and vitals reviewed. Constitutional: She is oriented to person, place, and time. She appears well-developed and well-nourished. No distress.  HENT:  Head: Normocephalic and atraumatic.  Eyes: Conjunctivae and EOM are normal.  Neck: Normal range of motion.  Cardiovascular: Normal rate and regular rhythm.  Exam reveals no gallop and no friction rub.   No murmur heard. Pulmonary/Chest: Effort normal and breath sounds normal. She has no wheezes. She has no rales. She exhibits no tenderness.  Abdominal: Soft. She exhibits no distension. There is no tenderness. There is no rebound and no guarding.  Musculoskeletal:  Right knee slightly limited ROM due to pain. Generalized tenderness to palpation of right knee. No obvious deformity.   Neurological: She is alert and oriented to person, place, and time. Coordination normal.  Speech is goal-oriented. Moves limbs without ataxia.   Skin: Skin is warm and dry.  Psychiatric: She has a normal mood and affect. Her behavior is normal.    ED Course  Procedures (including critical care time) Labs Review Labs Reviewed - No data to display Imaging Review Dg Chest 2 View  12/28/2013   CLINICAL DATA:  Shortness of breath  EXAM: CHEST  2 VIEW  COMPARISON:  12/20/2013  FINDINGS: The heart size and mediastinal contours are within normal limits. Both lungs are clear. The visualized skeletal structures are unremarkable.  IMPRESSION: No active cardiopulmonary disease.   Electronically Signed   By: Margaree Mackintosh M.D.   On: 12/28/2013  19:49   Dg Ankle Complete Right  12/30/2013   CLINICAL DATA:  Fall last night.  Right knee and ankle pain.  EXAM: RIGHT ANKLE - COMPLETE 3+ VIEW  COMPARISON:  Right foot radiographs 07/30/2013.  FINDINGS: Mild contour deformity of the distal fibula may be related to prior trauma. There is no evidence of acute fracture or dislocation. No focal osseous lesion is identified. No soft  tissue abnormality is seen.  IMPRESSION: No acute osseous abnormality.   Electronically Signed   By: Logan Bores   On: 12/30/2013 15:36   Dg Knee Complete 4 Views Right  12/30/2013   CLINICAL DATA:  Fall last night.  Right knee pain.  EXAM: RIGHT KNEE - COMPLETE 4+ VIEW  COMPARISON:  11/25/2013  FINDINGS: There is no evidence of acute fracture or dislocation. No knee joint effusion is identified. Minimal marginal spurring is again noted involving the medial and lateral compartments. No soft tissue abnormality is identified.  IMPRESSION: No evidence of acute osseous abnormality.   Electronically Signed   By: Logan Bores   On: 12/30/2013 15:38    EKG Interpretation   None       MDM   1. Fall   2. Right knee injury     3:49 PM Xrays unremarkable for acute changes. Patient like having soreness associated with bruising from the fall. Patient will be discharged with tylenol and robaxin for pain. Patient denies any other injuries. Vitals stable and patient afebrile.    Alvina Chou, PA-C 12/30/13 2357

## 2013-12-30 NOTE — ED Notes (Signed)
Pt reports developing generalized abdominal pain that began this morning upon waking. Pt reports emesis with intermittent hematemesis. Pt also reports lower back pain and left arm pain. Pt is A/O x4, in NAD, and vital signs are WDL.

## 2013-12-30 NOTE — ED Notes (Signed)
Pt reports falling at [facility] last night and now having right knee pain that radiates down her leg and into her ankle. Reports swelling to knee and swelling noted to right ankle.

## 2013-12-30 NOTE — Discharge Instructions (Signed)
Take tylenol as needed for pain. Take robaxin as needed for muscle spasm. Refer to attached documents for more information.

## 2013-12-31 LAB — COMPREHENSIVE METABOLIC PANEL
ALBUMIN: 3.9 g/dL (ref 3.5–5.2)
ALK PHOS: 93 U/L (ref 39–117)
ALT: 12 U/L (ref 0–35)
AST: 17 U/L (ref 0–37)
BILIRUBIN TOTAL: 0.3 mg/dL (ref 0.3–1.2)
BUN: 14 mg/dL (ref 6–23)
CHLORIDE: 102 meq/L (ref 96–112)
CO2: 25 mEq/L (ref 19–32)
Calcium: 9.3 mg/dL (ref 8.4–10.5)
Creatinine, Ser: 0.75 mg/dL (ref 0.50–1.10)
GFR calc Af Amer: >90 mL/min (ref >90)
GFR calc non Af Amer: >90 mL/min (ref >90)
Glucose, Bld: 97 mg/dL (ref 70–99)
POTASSIUM: 4.1 meq/L (ref 3.7–5.3)
Sodium: 139 mEq/L (ref 137–147)
Total Protein: 7.1 g/dL (ref 6.0–8.3)

## 2013-12-31 LAB — CBC WITH DIFFERENTIAL/PLATELET
BASOS ABS: 0 10*3/uL (ref 0.0–0.1)
BASOS PCT: 1 % (ref 0–1)
Eosinophils Absolute: 0.2 10*3/uL (ref 0.0–0.7)
Eosinophils Relative: 4 % (ref 0–5)
HCT: 36.6 % (ref 36.0–46.0)
Hemoglobin: 11.9 g/dL — ABNORMAL LOW (ref 12.0–15.0)
Lymphocytes Relative: 36 % (ref 12–46)
Lymphs Abs: 2.1 10*3/uL (ref 0.7–4.0)
MCH: 27.8 pg (ref 26.0–34.0)
MCHC: 32.5 g/dL (ref 30.0–36.0)
MCV: 85.5 fL (ref 78.0–100.0)
MONOS PCT: 10 % (ref 3–12)
Monocytes Absolute: 0.6 10*3/uL (ref 0.1–1.0)
NEUTROS PCT: 50 % (ref 43–77)
Neutro Abs: 2.9 10*3/uL (ref 1.7–7.7)
Platelets: 277 10*3/uL (ref 150–400)
RBC: 4.28 MIL/uL (ref 3.87–5.11)
RDW: 15.3 % (ref 11.5–15.5)
WBC: 5.8 10*3/uL (ref 4.0–10.5)

## 2013-12-31 LAB — LIPASE, BLOOD: Lipase: 44 U/L (ref 11–59)

## 2013-12-31 LAB — URINE MICROSCOPIC-ADD ON

## 2013-12-31 LAB — URINALYSIS, ROUTINE W REFLEX MICROSCOPIC
Bilirubin Urine: NEGATIVE
GLUCOSE, UA: NEGATIVE mg/dL
HGB URINE DIPSTICK: NEGATIVE
KETONES UR: NEGATIVE mg/dL
Nitrite: POSITIVE — AB
Protein, ur: NEGATIVE mg/dL
Specific Gravity, Urine: 1.025 (ref 1.005–1.030)
Urobilinogen, UA: 0.2 mg/dL (ref 0.0–1.0)
pH: 6 (ref 5.0–8.0)

## 2013-12-31 MED ORDER — NITROFURANTOIN MONOHYD MACRO 100 MG PO CAPS: 100.0000 mg | ORAL_CAPSULE | Freq: Two times a day (BID) | ORAL | Status: DC

## 2013-12-31 MED ORDER — PHENAZOPYRIDINE HCL 200 MG PO TABS: 200.0000 mg | ORAL_TABLET | Freq: Three times a day (TID) | ORAL | Status: DC

## 2013-12-31 NOTE — Discharge Instructions (Signed)
Please read and follow all provided instructions.  Your diagnoses today include:  1. Urinary tract infection     Tests performed today include:  Blood counts and electrolytes  Blood tests to check liver and kidney function  Blood tests to check pancreas function  Urine test to look for infection -- suggests that you have a urinary tract infection  Vital signs. See below for your results today.   Medications prescribed:   Macrobid - antibiotic for urinary tract infection  You have been prescribed an antibiotic medicine: take the entire course of medicine even if you are feeling better. Stopping early can cause the antibiotic not to work.   Pyridium - medication for urinary tract infection symptoms.   This medication will turn your urine orange. This is normal.   Take any prescribed medications only as directed.  Home care instructions:   Follow any educational materials contained in this packet.  Follow-up instructions: Please follow-up with your primary care provider in the next 2 days for further evaluation of your symptoms. If you do not have a primary care doctor -- see below for referral information.   Return instructions:  SEEK IMMEDIATE MEDICAL ATTENTION IF:  The pain does not go away or becomes severe   A temperature above 101F develops   Repeated vomiting occurs (multiple episodes)   The pain becomes localized to portions of the abdomen. The right side could possibly be appendicitis. In an adult, the left lower portion of the abdomen could be colitis or diverticulitis.   Blood is being passed in stools or vomit (bright red or black tarry stools)   You develop chest pain, difficulty breathing, dizziness or fainting, or become confused, poorly responsive, or inconsolable (young children)  If you have any other emergent concerns regarding your health  Additional Information: Abdominal (belly) pain can be caused by many things. Your caregiver performed an  examination and possibly ordered blood/urine tests and imaging (CT scan, x-rays, ultrasound). Many cases can be observed and treated at home after initial evaluation in the emergency department. Even though you are being discharged home, abdominal pain can be unpredictable. Therefore, you need a repeated exam if your pain does not resolve, returns, or worsens. Most patients with abdominal pain don't have to be admitted to the hospital or have surgery, but serious problems like appendicitis and gallbladder attacks can start out as nonspecific pain. Many abdominal conditions cannot be diagnosed in one visit, so follow-up evaluations are very important.  Your vital signs today were: BP 134/85   Pulse 82   Temp(Src) 98 F (36.7 C) (Oral)   Resp 18   SpO2 98% If your blood pressure (bp) was elevated above 135/85 this visit, please have this repeated by your doctor within one month. --------------

## 2013-12-31 NOTE — ED Provider Notes (Signed)
Medical screening examination/treatment/procedure(s) were performed by non-physician practitioner and as supervising physician I was immediately available for consultation/collaboration.  EKG Interpretation   None         Julianne Rice, MD 12/31/13 [phone]

## 2013-12-31 NOTE — ED Provider Notes (Signed)
Medical screening examination/treatment/procedure(s) were performed by non-physician practitioner and as supervising physician I was immediately available for consultation/collaboration.  EKG Interpretation   None         Osvaldo Shipper, MD 12/31/13 [phone]

## 2014-01-01 LAB — URINE CULTURE: Colony Count: >=100000

## 2014-01-06 ENCOUNTER — Encounter (INDEPENDENT_AMBULATORY_CARE_PROVIDER_SITE_OTHER): Payer: Self-pay

## 2014-01-06 ENCOUNTER — Encounter: Payer: Self-pay | Admitting: Internal Medicine

## 2014-01-06 ENCOUNTER — Encounter (HOSPITAL_COMMUNITY): Payer: Self-pay | Admitting: Emergency Medicine

## 2014-01-06 ENCOUNTER — Emergency Department (HOSPITAL_COMMUNITY): Payer: Medicaid Other

## 2014-01-06 ENCOUNTER — Ambulatory Visit (INDEPENDENT_AMBULATORY_CARE_PROVIDER_SITE_OTHER): Payer: Medicaid Other | Admitting: Internal Medicine

## 2014-01-06 ENCOUNTER — Emergency Department (HOSPITAL_COMMUNITY)
Admission: EM | Admit: 2014-01-06 | Discharge: 2014-01-06 | Disposition: A | Discharge status: Home / Self Care | Payer: Medicaid Other | Attending: Emergency Medicine | Admitting: Emergency Medicine

## 2014-01-06 VITALS — BP 118/72 | HR 103 | Ht 68.0 in | Wt 220.0 lb

## 2014-01-06 DIAGNOSIS — R296 Repeated falls: Secondary | ICD-10-CM | POA: Insufficient documentation

## 2014-01-06 DIAGNOSIS — Z888 Allergy status to other drugs, medicaments and biological substances status: Secondary | ICD-10-CM | POA: Insufficient documentation

## 2014-01-06 DIAGNOSIS — R002 Palpitations: Secondary | ICD-10-CM

## 2014-01-06 DIAGNOSIS — E669 Obesity, unspecified: Secondary | ICD-10-CM | POA: Insufficient documentation

## 2014-01-06 DIAGNOSIS — W19XXXA Unspecified fall, initial encounter: Secondary | ICD-10-CM

## 2014-01-06 DIAGNOSIS — F411 Generalized anxiety disorder: Secondary | ICD-10-CM | POA: Insufficient documentation

## 2014-01-06 DIAGNOSIS — S93409A Sprain of unspecified ligament of unspecified ankle, initial encounter: Secondary | ICD-10-CM | POA: Insufficient documentation

## 2014-01-06 DIAGNOSIS — Z886 Allergy status to analgesic agent status: Secondary | ICD-10-CM | POA: Insufficient documentation

## 2014-01-06 DIAGNOSIS — I493 Ventricular premature depolarization: Secondary | ICD-10-CM

## 2014-01-06 DIAGNOSIS — Z79899 Other long term (current) drug therapy: Secondary | ICD-10-CM | POA: Insufficient documentation

## 2014-01-06 DIAGNOSIS — R079 Chest pain, unspecified: Secondary | ICD-10-CM | POA: Insufficient documentation

## 2014-01-06 DIAGNOSIS — J40 Bronchitis, not specified as acute or chronic: Secondary | ICD-10-CM | POA: Insufficient documentation

## 2014-01-06 DIAGNOSIS — Z559 Problems related to education and literacy, unspecified: Secondary | ICD-10-CM | POA: Insufficient documentation

## 2014-01-06 DIAGNOSIS — R0602 Shortness of breath: Secondary | ICD-10-CM | POA: Insufficient documentation

## 2014-01-06 DIAGNOSIS — Z87891 Personal history of nicotine dependence: Secondary | ICD-10-CM | POA: Insufficient documentation

## 2014-01-06 DIAGNOSIS — S335XXA Sprain of ligaments of lumbar spine, initial encounter: Secondary | ICD-10-CM | POA: Insufficient documentation

## 2014-01-06 DIAGNOSIS — I4949 Other premature depolarization: Secondary | ICD-10-CM

## 2014-01-06 DIAGNOSIS — R42 Dizziness and giddiness: Secondary | ICD-10-CM

## 2014-01-06 DIAGNOSIS — T07XXXA Unspecified multiple injuries, initial encounter: Secondary | ICD-10-CM | POA: Insufficient documentation

## 2014-01-06 DIAGNOSIS — K219 Gastro-esophageal reflux disease without esophagitis: Secondary | ICD-10-CM | POA: Insufficient documentation

## 2014-01-06 DIAGNOSIS — Y9289 Other specified places as the place of occurrence of the external cause: Secondary | ICD-10-CM | POA: Insufficient documentation

## 2014-01-06 DIAGNOSIS — E039 Hypothyroidism, unspecified: Secondary | ICD-10-CM | POA: Insufficient documentation

## 2014-01-06 DIAGNOSIS — Y9389 Activity, other specified: Secondary | ICD-10-CM | POA: Insufficient documentation

## 2014-01-06 DIAGNOSIS — J449 Chronic obstructive pulmonary disease, unspecified: Secondary | ICD-10-CM | POA: Insufficient documentation

## 2014-01-06 DIAGNOSIS — J4489 Other specified chronic obstructive pulmonary disease: Secondary | ICD-10-CM | POA: Insufficient documentation

## 2014-01-06 DIAGNOSIS — J45909 Unspecified asthma, uncomplicated: Secondary | ICD-10-CM | POA: Insufficient documentation

## 2014-01-06 DIAGNOSIS — R51 Headache: Secondary | ICD-10-CM | POA: Insufficient documentation

## 2014-01-06 DIAGNOSIS — I5032 Chronic diastolic (congestive) heart failure: Secondary | ICD-10-CM | POA: Insufficient documentation

## 2014-01-06 DIAGNOSIS — R072 Precordial pain: Secondary | ICD-10-CM

## 2014-01-06 DIAGNOSIS — I252 Old myocardial infarction: Secondary | ICD-10-CM | POA: Insufficient documentation

## 2014-01-06 DIAGNOSIS — Z880 Allergy status to penicillin: Secondary | ICD-10-CM | POA: Insufficient documentation

## 2014-01-06 DIAGNOSIS — G8929 Other chronic pain: Secondary | ICD-10-CM | POA: Insufficient documentation

## 2014-01-06 DIAGNOSIS — M2550 Pain in unspecified joint: Secondary | ICD-10-CM | POA: Insufficient documentation

## 2014-01-06 MED ORDER — KETOROLAC TROMETHAMINE 60 MG/2ML IM SOLN
60.0000 mg | Freq: Once | INTRAMUSCULAR | Status: AC
  Administered 2014-01-06: 60 mg via INTRAMUSCULAR
  Filled 2014-01-06: qty 2

## 2014-01-06 MED ORDER — METOPROLOL SUCCINATE ER 25 MG PO TB24: 25.0000 mg | ORAL_TABLET | Freq: Every day | ORAL | Status: DC

## 2014-01-06 NOTE — ED Notes (Signed)
Pt informed that vitals are stable and discharge instructions reviewed. Pt calm and cooperative and reports she has improved at this time with no questions.

## 2014-01-06 NOTE — ED Provider Notes (Signed)
Medical screening examination/treatment/procedure(s) were performed by non-physician practitioner and as supervising physician I was immediately available for consultation/collaboration.  EKG Interpretation   None         Sharyon Cable, MD 01/06/14 [phone]

## 2014-01-06 NOTE — ED Notes (Signed)
Pt is tearful and reports she feels SOB and like her heart is fluttering; PA aware.

## 2014-01-06 NOTE — ED Notes (Signed)
Pt c/o right ankle pain. No deformity noted. Also c/o lower back pain. Ambulates without difficulty.

## 2014-01-06 NOTE — Patient Instructions (Signed)
Your physician recommends that you schedule a follow-up appointment in: 6 weeks with Dr Rayann Heman  Your physician has recommended you make the following change in your medication:  1) Start Toprol XL 25mg  daily

## 2014-01-06 NOTE — Progress Notes (Signed)
PCP:  Andrena Mews, MD Primary Cardiologist:  Dr Johnsie Cancel  The patient presents today for electrophysiology followup.  She reports "heart skipping" for a week.  She describes these as brief (several seconds) of "fluttering" with associated SOB.  She has had numerous recent ER visits for various reasons.  I have reviewed multiple ekgs which reveal no pvcs.  She has very rare pvcs today.  She denies symptoms of exertional chest pain, shortness of breath, orthopnea, PND, lower extremity edema, presyncope, syncope, or neurologic sequela.  She has stable mild postural dizziness.  The patient feels that she is tolerating medications without difficulties and is otherwise without complaint today.   Past Medical History  Diagnosis Date  . Chest pain     a. s/p reported MI in 1997;  b. 09/2012 Cath: nl cors, nl LV ([place]);  c. 05/2012 Non-ischemic myoview.  . Hypertension   . Arthritis   . Problem with literacy   . Shortness of breath   . Anxiety   . Bronchitis   . Chronic headaches   . Obesity   . COPD (chronic obstructive pulmonary disease)   . Chronic diastolic CHF (congestive heart failure)     a. 12/2011 Echo: nl ef, Gr 1 DD;  b. 11/2012 Echo: EF 45-50%, no significant valvular abnormalities.  Marland Kitchen GERD (gastroesophageal reflux disease)   . Myocardial infarction 1997    self reported  . Hypothyroidism   . Asthma   . Chronic chest pain    Past Surgical History  Procedure Laterality Date  . Ectopic pregnancy surgery      right  . Nm myoview ltd  June 2013    no reversible ischemia  . Cardiac catheterization  October 2012    No coronary artery disease  . Cardiac catheterization  2012    normal coronary arteries  . Nm myoview ltd  2012, 2013    normal  . 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 Knee arthroscopy  08/13/13    right    Current Outpatient Prescriptions  Medication Sig Dispense Refill  . acetaminophen (TYLENOL) 500 MG tablet Take 2  tablets (1,000 mg total) by mouth every 6 (six) hours as needed.  30 tablet  0  . albuterol (PROVENTIL HFA;VENTOLIN HFA) 108 (90 BASE) MCG/ACT inhaler Inhale 2 puffs into the lungs every 6 (six) hours as needed for wheezing or shortness of breath.      Marland Kitchen aspirin EC 81 MG tablet Take 81 mg by mouth daily.      Marland Kitchen azithromycin (ZITHROMAX) 250 MG tablet Take 1 tablet (250 mg total) by mouth daily. Take first 2 tablets together, then 1 every day until finished.  6 tablet  0  . beclomethasone (QVAR) 80 MCG/ACT inhaler Inhale 1 puff into the lungs 2 (two) times daily.  1 Inhaler  5  . chlorpheniramine-HYDROcodone (TUSSIONEX PENNKINETIC ER) 10-8 MG/5ML LQCR Take 5 mLs by mouth every 12 (twelve) hours as needed for cough.  70 mL  0  . levothyroxine (SYNTHROID, LEVOTHROID) 50 MCG tablet Take by mouth daily.      . methocarbamol (ROBAXIN) 500 MG tablet Take 1 tablet (500 mg total) by mouth 2 (two) times daily as needed for muscle spasms.  20 tablet  0  . nitrofurantoin, macrocrystal-monohydrate, (MACROBID) 100 MG capsule Take 1 capsule (100 mg total) by mouth 2 (two) times daily.  10 capsule  0  . phenazopyridine (PYRIDIUM) 200 MG  tablet Take 1 tablet (200 mg total) by mouth 3 (three) times daily.  6 tablet  0  . promethazine (PHENERGAN) 25 MG tablet Take 1 tablet (25 mg total) by mouth every 6 (six) hours as needed for nausea or vomiting.  12 tablet  0  . metoprolol succinate (TOPROL-XL) 25 MG 24 hr tablet Take 1 tablet (25 mg total) by mouth daily.  90 tablet  3   No current facility-administered medications for this visit.    Allergies  Allergen Reactions  . Darvocet [Propoxyphene N-Acetaminophen] Nausea And Vomiting  . Ibuprofen     Upset stomach   . Tramadol     vomiting  . Celebrex [Celecoxib] Nausea And Vomiting and Rash  . Naproxen Nausea And Vomiting and Rash  . Penicillins Hives    History   Social History  . Marital Status: Legally Separated    Spouse Name: N/A    Number of Children:  N/A  . Years of Education: N/A   Occupational History  . Not on file.   Social History Main Topics  . Smoking status: Former Smoker -- 1.00 packs/day for 3 years    Quit date: 12/03/2010  . Smokeless tobacco: Never Used  . Alcohol Use: No  . Drug Use: No  . Sexual Activity: No   Other Topics Concern  . Not on file   Social History Narrative   Lives with a female friend, just roommates, separated.  Has 4 kids, all grown.  Finished 10th grade.  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  Lives in [place]    Family History  Problem Relation Age of Onset  . Cancer Mother 41    lung ca  . Diabetes Mother   . Anxiety disorder Sister   . Cancer Brother 60    lung  . Colon cancer Neg Hx   . Pancreatic cancer Neg Hx   . Rectal cancer Neg Hx   . Stomach cancer Neg Hx     Physical Exam: Filed Vitals:   01/06/14 1336 01/06/14 1356 01/06/14 1412 01/06/14 1413  BP: 128/88 118/60 120/78 116/68  Pulse: 59 74 83 92  Height: 5\' 8"  (1.727 m)     Weight: 220 lb (99.791 kg)     SpO2:  99% 99% 98%    GEN- The patient is disheveled appearing, alert and oriented x 3 today.   Head- normocephalic, atraumatic Eyes-  Sclera clear, conjunctiva pink Ears- hearing intact Oropharynx- clear Neck- supple,  Lungs- Clear to ausculation bilaterally, normal work of breathing Heart- Regular rate and rhythm with very rare ectopy, no murmurs, rubs or gallops, PMI not laterally displaced GI- soft, NT, ND, + BS Extremities- no clubbing, cyanosis, or edema MS- no significant deformity or atrophy Skin- no rash or lesion Psych- euthymic mood, full affect Neuro- strength and sensation are intact  ekg today reveals sinus rhythm with occasional PVCs, otherwise normal She is orthostatic by HR today  Assessment and Plan:  1. PVCs- she has rare palpitations again.  I suspect infrequent PVCs as the cause. 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 Postural dizziness- mild Adequate hydration and salt liberalization again encouraged today  Return to see me in 6 weeks

## 2014-01-06 NOTE — ED Notes (Signed)
PT ambulated with baseline gait; VSS; A&Ox3; no signs of distress; respirations even and unlabored; skin warm and dry; no questions upon discharge. Bus pass given

## 2014-01-06 NOTE — ED Notes (Signed)
Pt. Transported to xray 

## 2014-01-06 NOTE — ED Notes (Signed)
Pt reports that she fell twisting her R ankle and landing on her back 3 days ago and shes had ankle and back pain since. Pt is ambulatory and MAE. Took tylenol with no relief

## 2014-01-06 NOTE — ED Provider Notes (Signed)
CSN: [id number]     Arrival date & time 01/06/14  1437 History   This chart was scribed for non-physician practitioner working with Sharyon Cable, MD by Stacy Gardner, ED scribe. This patient was seen in room TR05C/TR05C and the patient's care was started at 3:29 PM. First MD Initiated Contact with Patient 01/06/14 1502     Chief Complaint  Patient presents with  . Ankle Pain   (Consider location/radiation/quality/duration/timing/severity/associated sxs/prior Treatment) HPI HPI Comments: [name] is a 58 y.o. female who presents to the Emergency Department complaining of right ankle pain for the past three days after a fall. Pt mentions that she twisted her ankle and landed on her back while taking out her sister's trash.The R ankle pain is constant, sharp, 10/10 in severity and worse with walking. Pt is also experiencing constant, moderate, lumbar pain. She mentions her R knee has intermittent numbness. Denies weakness.  She is ambulatory in the ED. She has tried Tylenol with no relief. Denies loss of control of bowels or bladder or saddle anesthesia.     Past Medical History  Diagnosis Date  . Chest pain     a. s/p reported MI in 1997;  b. 09/2012 Cath: nl cors, nl LV ([place]);  c. 05/2012 Non-ischemic myoview.  . Hypertension   . Arthritis   . Problem with literacy   . Shortness of breath   . Anxiety   . Bronchitis   . Chronic headaches   . Obesity   . COPD (chronic obstructive pulmonary disease)   . Chronic diastolic CHF (congestive heart failure)     a. 12/2011 Echo: nl ef, Gr 1 DD;  b. 11/2012 Echo: EF 45-50%, no significant valvular abnormalities.  Marland Kitchen GERD (gastroesophageal reflux disease)   . Myocardial infarction 1997    self reported  . Hypothyroidism   . Asthma   . Chronic chest pain    Past Surgical History  Procedure Laterality Date  . Ectopic pregnancy surgery      right  . Nm myoview ltd  June 2013    no reversible ischemia  . Cardiac  catheterization  October 2012    No coronary artery disease  . Cardiac catheterization  2012    normal coronary arteries  . Nm myoview ltd  2012, 2013    normal  . 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 Knee arthroscopy  08/13/13    right   Family History  Problem Relation Age of Onset  . Cancer Mother 35    lung ca  . Diabetes Mother   . Anxiety disorder Sister   . Cancer Brother 60    lung  . Colon cancer Neg Hx   . Pancreatic cancer Neg Hx   . Rectal cancer Neg Hx   . Stomach cancer Neg Hx    History  Substance Use Topics  . Smoking status: Former Smoker -- 1.00 packs/day for 3 years    Quit date: 12/03/2010  . Smokeless tobacco: Never Used  . Alcohol Use: No   OB History   Grav Para Term Preterm Abortions TAB SAB Ect Mult Living                 Review of Systems  Musculoskeletal: Positive for arthralgias and myalgias.  All other systems reviewed and are negative.    Allergies  Darvocet; Ibuprofen; Tramadol; Celebrex; Naproxen; and Penicillins  Home Medications   Current  Outpatient Rx  Name  Route  Sig  Dispense  Refill  . acetaminophen (TYLENOL) 500 MG tablet   Oral   Take 2 tablets (1,000 mg total) by mouth every 6 (six) hours as needed.   30 tablet   0   . albuterol (PROVENTIL HFA;VENTOLIN HFA) 108 (90 BASE) MCG/ACT inhaler   Inhalation   Inhale 2 puffs into the lungs every 6 (six) hours as needed for wheezing or shortness of breath.         Marland Kitchen aspirin EC 81 MG tablet   Oral   Take 81 mg by mouth daily.         Marland Kitchen azithromycin (ZITHROMAX) 250 MG tablet   Oral   Take 1 tablet (250 mg total) by mouth daily. Take first 2 tablets together, then 1 every day until finished.   6 tablet   0   . beclomethasone (QVAR) 80 MCG/ACT inhaler   Inhalation   Inhale 1 puff into the lungs 2 (two) times daily.   1 Inhaler   5   . chlorpheniramine-HYDROcodone (TUSSIONEX PENNKINETIC ER) 10-8 MG/5ML LQCR   Oral   Take 5  mLs by mouth every 12 (twelve) hours as needed for cough.   70 mL   0   . levothyroxine (SYNTHROID, LEVOTHROID) 50 MCG tablet   Oral   Take by mouth daily.         . methocarbamol (ROBAXIN) 500 MG tablet   Oral   Take 1 tablet (500 mg total) by mouth 2 (two) times daily as needed for muscle spasms.   20 tablet   0   . metoprolol succinate (TOPROL-XL) 25 MG 24 hr tablet   Oral   Take 1 tablet (25 mg total) by mouth daily.   90 tablet   3   . nitrofurantoin, macrocrystal-monohydrate, (MACROBID) 100 MG capsule   Oral   Take 1 capsule (100 mg total) by mouth 2 (two) times daily.   10 capsule   0   . phenazopyridine (PYRIDIUM) 200 MG tablet   Oral   Take 1 tablet (200 mg total) by mouth 3 (three) times daily.   6 tablet   0   . promethazine (PHENERGAN) 25 MG tablet   Oral   Take 1 tablet (25 mg total) by mouth every 6 (six) hours as needed for nausea or vomiting.   12 tablet   0    BP 146/87  Pulse 97  Temp(Src) 97.6 F (36.4 C) (Oral)  Resp 18  SpO2 100% Physical Exam  Nursing note and vitals reviewed. Constitutional: She is oriented to person, place, and time. She appears well-developed and well-nourished. No distress.  obese  HENT:  Head: Normocephalic and atraumatic.  Mouth/Throat: Oropharynx is clear and moist.  Eyes: Conjunctivae and EOM are normal.  Neck: Normal range of motion. Neck supple.  Cardiovascular: Normal rate, regular rhythm and normal heart sounds.   Pulmonary/Chest: Effort normal and breath sounds normal. No respiratory distress.  Abdominal: Soft. Bowel sounds are normal. There is no tenderness.  Musculoskeletal: Normal range of motion. She exhibits no edema.  Tender medially and laterally to the Right ankle. No swelling, deformity  or ecchymosis.  Full passive ROM .  Intact DP.  TTP bilateral paraspinal muscles lumbar. No spinal process tenderness Strength lower extremities 5/5 and equal bilaterally   Neurological: She is alert and  oriented to person, place, and time. No sensory deficit.  Skin: Skin is warm and dry. She is not diaphoretic.  Psychiatric: She has a normal mood and affect. Her behavior is normal.    ED Course  Procedures (including critical care time) DIAGNOSTIC STUDIES: Oxygen Saturation is 100% on room air, normal by my interpretation.    COORDINATION OF CARE:  3:32 PM Discussed course of care with pt. Pt understands and agrees.   Labs Review Labs Reviewed - No data to display Imaging Review No results found.  EKG Interpretation   None       MDM   1. Fall   2. Ankle sprain   3. Lumbar sprain     Neurovascularly intact. ACE wrap given. RICE discussed. No red flags concerning patient's back pain. No s/s of central cord compression or cauda equina. Lower extremities are neurovascularly intact and patient is ambulating with a limp due to ankle pain. F/u with PCP. Return precautions given. Patient states understanding of treatment care plan and is agreeable.  I personally performed the services described in this documentation, which was scribed in my presence. The recorded information has been reviewed and is accurate.   Illene Labrador, PA-C 01/06/14 1547

## 2014-01-07 ENCOUNTER — Telehealth: Payer: Self-pay | Admitting: Surgery

## 2014-01-07 NOTE — Telephone Encounter (Signed)
Message copied by Laurena Slimmer on Fri Jan 07, 2014  3:52 PM ------      Message from: Charlann Lange      Created: Tue Dec 28, 2013  8:24 PM      Regarding: Order for Lysle Pearl             Patient Name: [name], [name] RNHAF([id number])      Sex: Female      DOB: 27-Aug-1956              PCP: ENIOLA, [place]             Types of orders made on 12/28/2013: Consult, ECG, Imaging, Lab, Medications,                                           Nursing            Order Date:12/28/2013      Ordering User:NARVAEZ, [place]      Attending Provider:Christopher Hewitt Shorts, MD [phone]      Authorizing Provider: Dewaine Oats, PA-C [phone]      Department:[facility][[id number]]            Order Specific Information      Order: COPD clinic follow up [Custom: [id number]]  Order #: [id number]  Qty: 1        Priority: Routine  Class: Hospital Performed          Where does the patient receive follow up COPD care -> Other                 Follow up in -> Other (see comments)               Released on: 12/28/2013  8:24 PM                    Priority: Routine  Class: Hospital Performed          Where does the patient receive follow up COPD care -> Other                 Follow up in -> Other (see comments)               Released on: 12/28/2013  8:24 PM       ------

## 2014-01-08 ENCOUNTER — Emergency Department (HOSPITAL_COMMUNITY): Payer: Medicaid Other

## 2014-01-08 ENCOUNTER — Emergency Department (HOSPITAL_COMMUNITY)
Admission: EM | Admit: 2014-01-08 | Discharge: 2014-01-08 | Disposition: A | Discharge status: Home / Self Care | Payer: Medicaid Other | Attending: Emergency Medicine | Admitting: Emergency Medicine

## 2014-01-08 ENCOUNTER — Encounter (HOSPITAL_COMMUNITY): Payer: Self-pay | Admitting: Emergency Medicine

## 2014-01-08 DIAGNOSIS — J441 Chronic obstructive pulmonary disease with (acute) exacerbation: Secondary | ICD-10-CM | POA: Insufficient documentation

## 2014-01-08 DIAGNOSIS — J45901 Unspecified asthma with (acute) exacerbation: Secondary | ICD-10-CM

## 2014-01-08 DIAGNOSIS — Z8719 Personal history of other diseases of the digestive system: Secondary | ICD-10-CM | POA: Insufficient documentation

## 2014-01-08 DIAGNOSIS — Z8659 Personal history of other mental and behavioral disorders: Secondary | ICD-10-CM | POA: Insufficient documentation

## 2014-01-08 DIAGNOSIS — Z95818 Presence of other cardiac implants and grafts: Secondary | ICD-10-CM | POA: Insufficient documentation

## 2014-01-08 DIAGNOSIS — I5032 Chronic diastolic (congestive) heart failure: Secondary | ICD-10-CM | POA: Insufficient documentation

## 2014-01-08 DIAGNOSIS — M129 Arthropathy, unspecified: Secondary | ICD-10-CM | POA: Insufficient documentation

## 2014-01-08 DIAGNOSIS — G8929 Other chronic pain: Secondary | ICD-10-CM | POA: Insufficient documentation

## 2014-01-08 DIAGNOSIS — Z7982 Long term (current) use of aspirin: Secondary | ICD-10-CM | POA: Insufficient documentation

## 2014-01-08 DIAGNOSIS — E669 Obesity, unspecified: Secondary | ICD-10-CM | POA: Insufficient documentation

## 2014-01-08 DIAGNOSIS — R0789 Other chest pain: Secondary | ICD-10-CM | POA: Insufficient documentation

## 2014-01-08 DIAGNOSIS — Z880 Allergy status to penicillin: Secondary | ICD-10-CM | POA: Insufficient documentation

## 2014-01-08 DIAGNOSIS — I252 Old myocardial infarction: Secondary | ICD-10-CM | POA: Insufficient documentation

## 2014-01-08 DIAGNOSIS — IMO0002 Reserved for concepts with insufficient information to code with codable children: Secondary | ICD-10-CM | POA: Insufficient documentation

## 2014-01-08 DIAGNOSIS — Z79899 Other long term (current) drug therapy: Secondary | ICD-10-CM | POA: Insufficient documentation

## 2014-01-08 DIAGNOSIS — R079 Chest pain, unspecified: Secondary | ICD-10-CM

## 2014-01-08 DIAGNOSIS — Z87891 Personal history of nicotine dependence: Secondary | ICD-10-CM | POA: Insufficient documentation

## 2014-01-08 DIAGNOSIS — E039 Hypothyroidism, unspecified: Secondary | ICD-10-CM | POA: Insufficient documentation

## 2014-01-08 DIAGNOSIS — I10 Essential (primary) hypertension: Secondary | ICD-10-CM | POA: Insufficient documentation

## 2014-01-08 LAB — POCT I-STAT TROPONIN I
Troponin i, poc: 0 ng/mL (ref 0.00–0.08)
Troponin i, poc: 0 ng/mL (ref 0.00–0.08)

## 2014-01-08 LAB — BASIC METABOLIC PANEL
BUN: 11 mg/dL (ref 6–23)
CO2: 22 meq/L (ref 19–32)
CREATININE: 0.74 mg/dL (ref 0.50–1.10)
Calcium: 8.8 mg/dL (ref 8.4–10.5)
Chloride: 106 mEq/L (ref 96–112)
GFR calc Af Amer: >90 mL/min (ref >90)
GLUCOSE: 89 mg/dL (ref 70–99)
Potassium: 4.1 mEq/L (ref 3.7–5.3)
SODIUM: 142 meq/L (ref 137–147)

## 2014-01-08 LAB — CBC
HCT: 37.2 % (ref 36.0–46.0)
HEMOGLOBIN: 12.6 g/dL (ref 12.0–15.0)
MCH: 28.8 pg (ref 26.0–34.0)
MCHC: 33.9 g/dL (ref 30.0–36.0)
MCV: 84.9 fL (ref 78.0–100.0)
Platelets: 252 10*3/uL (ref 150–400)
RBC: 4.38 MIL/uL (ref 3.87–5.11)
RDW: 14.9 % (ref 11.5–15.5)
WBC: 5.9 10*3/uL (ref 4.0–10.5)

## 2014-01-08 LAB — PRO B NATRIURETIC PEPTIDE: Pro B Natriuretic peptide (BNP): 68.2 pg/mL (ref 0–125)

## 2014-01-08 MED ORDER — HYDROCOD POLST-CHLORPHEN POLST 10-8 MG/5ML PO LQCR
5.0000 mL | Freq: Once | ORAL | Status: AC
  Administered 2014-01-08: 5 mL via ORAL
  Filled 2014-01-08: qty 5

## 2014-01-08 MED ORDER — HYDROCODONE-ACETAMINOPHEN 5-325 MG PO TABS
2.0000 | ORAL_TABLET | Freq: Once | ORAL | Status: AC
  Administered 2014-01-08: 2 via ORAL
  Filled 2014-01-08: qty 2

## 2014-01-08 NOTE — ED Provider Notes (Signed)
CSN: [id number]     Arrival date & time 01/08/14  1533 History   First MD Initiated Contact with Patient 01/08/14 1537     Chief Complaint  Patient presents with  . Shortness of Breath   (Consider location/radiation/quality/duration/timing/severity/associated sxs/prior Treatment) Patient is a 58 y.o. female presenting with shortness of breath. The history is provided by the patient.  Shortness of Breath Severity:  Mild Onset quality:  Gradual Duration:  2 days Timing:  Intermittent Progression:  Worsening Chronicity:  Recurrent Context: activity and URI (nonproductive cough)   Relieved by:  Nothing Worsened by:  Nothing tried Associated symptoms: chest pain (intermittent, R sided, presure, nonradiating) and cough (nonproductive)   Associated symptoms: no abdominal pain, no fever, no rash, no vomiting and no wheezing     Past Medical History  Diagnosis Date  . Chest pain     a. s/p reported MI in 1997;  b. 09/2012 Cath: nl cors, nl LV ([place]);  c. 05/2012 Non-ischemic myoview.  . Hypertension   . Arthritis   . Problem with literacy   . Shortness of breath   . Anxiety   . Bronchitis   . Chronic headaches   . Obesity   . COPD (chronic obstructive pulmonary disease)   . Chronic diastolic CHF (congestive heart failure)     a. 12/2011 Echo: nl ef, Gr 1 DD;  b. 11/2012 Echo: EF 45-50%, no significant valvular abnormalities.  Marland Kitchen GERD (gastroesophageal reflux disease)   . Myocardial infarction 1997    self reported  . Hypothyroidism   . Asthma   . Chronic chest pain    Past Surgical History  Procedure Laterality Date  . Ectopic pregnancy surgery      right  . Nm myoview ltd  June 2013    no reversible ischemia  . Cardiac catheterization  October 2012    No coronary artery disease  . Cardiac catheterization  2012    normal coronary arteries  . Nm myoview ltd  2012, 2013    normal  . 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 Knee  arthroscopy  08/13/13    right   Family History  Problem Relation Age of Onset  . Cancer Mother 53    lung ca  . Diabetes Mother   . Anxiety disorder Sister   . Cancer Brother 60    lung  . Colon cancer Neg Hx   . Pancreatic cancer Neg Hx   . Rectal cancer Neg Hx   . Stomach cancer Neg Hx    History  Substance Use Topics  . Smoking status: Former Smoker -- 1.00 packs/day for 3 years    Quit date: 12/03/2010  . Smokeless tobacco: Never Used  . Alcohol Use: No   OB History   Grav Para Term Preterm Abortions TAB SAB Ect Mult Living                 Review of Systems  Constitutional: Negative for fever.  Respiratory: Positive for cough (nonproductive) and shortness of breath. Negative for wheezing.   Cardiovascular: Positive for chest pain (intermittent, R sided, presure, nonradiating).  Gastrointestinal: Negative for nausea, vomiting and abdominal pain.  Skin: Negative for rash.  All other systems reviewed and are negative.    Allergies  Darvocet; Ibuprofen; Tramadol; Tylenol; Celebrex; Naproxen; and Penicillins  Home Medications   Current Outpatient Rx  Name  Route  Sig  Dispense  Refill  .  albuterol (PROVENTIL HFA;VENTOLIN HFA) 108 (90 BASE) MCG/ACT inhaler   Inhalation   Inhale 2 puffs into the lungs every 6 (six) hours as needed for wheezing or shortness of breath.         Marland Kitchen aspirin EC 81 MG tablet   Oral   Take 81 mg by mouth daily.         . beclomethasone (QVAR) 80 MCG/ACT inhaler   Inhalation   Inhale 1 puff into the lungs 2 (two) times daily.   1 Inhaler   5   . levothyroxine (SYNTHROID, LEVOTHROID) 50 MCG tablet   Oral   Take 50 mcg by mouth daily.          . methocarbamol (ROBAXIN) 500 MG tablet   Oral   Take 1 tablet (500 mg total) by mouth 2 (two) times daily as needed for muscle spasms.   20 tablet   0   . metoprolol succinate (TOPROL-XL) 25 MG 24 hr tablet   Oral   Take 1 tablet (25 mg total) by mouth daily.   90 tablet   3    . nitrofurantoin, macrocrystal-monohydrate, (MACROBID) 100 MG capsule   Oral   Take 1 capsule (100 mg total) by mouth 2 (two) times daily.   10 capsule   0   . promethazine (PHENERGAN) 25 MG tablet   Oral   Take 1 tablet (25 mg total) by mouth every 6 (six) hours as needed for nausea or vomiting.   12 tablet   0    BP 138/69  Resp 16  Ht 5\' 8"  (1.727 m)  Wt 210 lb (95.255 kg)  BMI 31.94 kg/m2  SpO2 100% Physical Exam  Nursing note and vitals reviewed. Constitutional: She is oriented to person, place, and time. She appears well-developed and well-nourished. No distress.  HENT:  Head: Normocephalic and atraumatic.  Eyes: EOM are normal. Pupils are equal, round, and reactive to light.  Neck: Normal range of motion. Neck supple.  Cardiovascular: Normal rate and regular rhythm.  Exam reveals no friction rub.   No murmur heard. Pulmonary/Chest: Effort normal and breath sounds normal. No respiratory distress. She has no wheezes. She has no rales.  Abdominal: Soft. She exhibits no distension. There is no tenderness. There is no rebound.  Musculoskeletal: Normal range of motion. She exhibits no edema.  Neurological: She is alert and oriented to person, place, and time. No cranial nerve deficit. She exhibits normal muscle tone. Coordination normal.  Skin: No rash noted. She is not diaphoretic.    ED Course  Procedures (including critical care time) Labs Review Labs Reviewed  CBC  BASIC METABOLIC PANEL  PRO B NATRIURETIC PEPTIDE   Imaging Review Dg Chest 2 View  01/08/2014   CLINICAL DATA:  Cough, chest pain  EXAM: CHEST  2 VIEW  COMPARISON:  DG CHEST 2 VIEW dated 12/28/2013  FINDINGS: The heart size and mediastinal contours are within normal limits. Both lungs are clear. The visualized skeletal structures are unremarkable. Minimal leftward curvature of the thoracic spine centered at T8 noted. This is stable.  IMPRESSION: No active cardiopulmonary disease.   Electronically Signed    By: Conchita Paris M.D.   On: 01/08/2014 16:42   Dg Lumbar Spine 2-3 Views  01/08/2014   CLINICAL DATA:  Low back pain after fall 3 days ago.  EXAM: LUMBAR SPINE - 2-3 VIEW  COMPARISON:  11/25/2013  FINDINGS: Vertebral body alignment, heights and disc space heights are within normal. There is minimal  spondylosis present. Facet arthropathy is present. There is no compression fracture or subluxation. Remainder of the exam is unchanged.  IMPRESSION: No acute findings.   Electronically Signed   By: Marin Olp M.D.   On: 01/08/2014 18:40    EKG Interpretation    Date/Time:  Saturday January 08 2014 15:35:54 EST Ventricular Rate:  71 PR Interval:  154 QRS Duration: 86 QT Interval:  400 QTC Calculation: 435 R Axis:   25 Text Interpretation:  Sinus rhythm Low voltage, precordial leads Nonspecific T abnormalities, lateral leads Similar to prior Confirmed by Mingo Amber  MD, [place] ([id number]) on 01/08/2014 3:49:28 PM            MDM   1. Chest pain    58 year old female presents with shortness of breath. Present for the past 2 days. Associated nonproductive cough. She'll also complaining of intermittent right-sided chest pain. Chest pain is described as pressure and feels similar to her prior MI. Patient's EKG here was normal and similar to previous. Patient with out any chest pain upon arrival. She is not in any respiratory distress. She sat in 100% on room air. Oxygen not need it is provided by EMS. Exam is benign. No peripheral edema, no wheezing, no Rales. We'll check chest x-ray and cardiac labs. Unlikely to be COPD exacerbation. I doubt heart failure exacerbation. BNP normal. No respiratory distress, serial troponins normal. Patient stable for discharge. Patient stated a fall with some back pain previously, no other issues. L-spine xray normal. Stable for discharge.    Osvaldo Shipper, MD 01/09/14 1106

## 2014-01-08 NOTE — Discharge Instructions (Signed)
Chest Pain (Nonspecific) °It is often hard to give a specific diagnosis for the cause of chest pain. There is always a chance that your pain could be related to something serious, such as a heart attack or a blood clot in the lungs. You need to follow up with your caregiver for further evaluation. °CAUSES  °· Heartburn. °· Pneumonia or bronchitis. °· Anxiety or stress. °· Inflammation around your heart (pericarditis) or lung (pleuritis or pleurisy). °· A blood clot in the lung. °· A collapsed lung (pneumothorax). It can develop suddenly on its own (spontaneous pneumothorax) or from injury (trauma) to the chest. °· Shingles infection (herpes zoster virus). °The chest wall is composed of bones, muscles, and cartilage. Any of these can be the source of the pain. °· The bones can be bruised by injury. °· The muscles or cartilage can be strained by coughing or overwork. °· The cartilage can be affected by inflammation and become sore (costochondritis). °DIAGNOSIS  °Lab tests or other studies, such as X-rays, electrocardiography, stress testing, or cardiac imaging, may be needed to find the cause of your pain.  °TREATMENT  °· Treatment depends on what may be causing your chest pain. Treatment may include: °· Acid blockers for heartburn. °· Anti-inflammatory medicine. °· Pain medicine for inflammatory conditions. °· Antibiotics if an infection is present. °· You may be advised to change lifestyle habits. This includes stopping smoking and avoiding alcohol, caffeine, and chocolate. °· You may be advised to keep your head raised (elevated) when sleeping. This reduces the chance of acid going backward from your stomach into your esophagus. °· Most of the time, nonspecific chest pain will improve within 2 to 3 days with rest and mild pain medicine. °HOME CARE INSTRUCTIONS  °· If antibiotics were prescribed, take your antibiotics as directed. Finish them even if you start to feel better. °· For the next few days, avoid physical  activities that bring on chest pain. Continue physical activities as directed. °· Do not smoke. °· Avoid drinking alcohol. °· Only take over-the-counter or prescription medicine for pain, discomfort, or fever as directed by your caregiver. °· Follow your caregiver's suggestions for further testing if your chest pain does not go away. °· Keep any follow-up appointments you made. If you do not go to an appointment, you could develop lasting (chronic) problems with pain. If there is any problem keeping an appointment, you must call to reschedule. °SEEK MEDICAL CARE IF:  °· You think you are having problems from the medicine you are taking. Read your medicine instructions carefully. °· Your chest pain does not go away, even after treatment. °· You develop a rash with blisters on your chest. °SEEK IMMEDIATE MEDICAL CARE IF:  °· You have increased chest pain or pain that spreads to your arm, neck, jaw, back, or abdomen. °· You develop shortness of breath, an increasing cough, or you are coughing up blood. °· You have severe back or abdominal pain, feel nauseous, or vomit. °· You develop severe weakness, fainting, or chills. °· You have a fever. °THIS IS AN EMERGENCY. Do not wait to see if the pain will go away. Get medical help at once. Call your local emergency services (911 in [place] Do not drive yourself to the hospital. °MAKE SURE YOU:  °· Understand these instructions. °· Will watch your condition. °· Will get help right away if you are not doing well or get worse. °Document Released: 09/11/2005 Document Revised: 02/24/2012 Document Reviewed: 07/07/2008 °[employer]® Patient Information ©2014 [employer]   [employer]. ° °

## 2014-01-08 NOTE — ED Notes (Addendum)
Patient arrived via [facility] from home with shortness of breath and a cough x2 days. Patient states she is unable to cough anything up. She used her inhaler at home without any relief. VSS, A/O x4, MAE x4. No sign of distress at this time. No meds given by EMS.

## 2014-01-13 ENCOUNTER — Emergency Department (HOSPITAL_COMMUNITY)
Admission: EM | Admit: 2014-01-13 | Discharge: 2014-01-13 | Disposition: A | Discharge status: Home / Self Care | Payer: Medicaid Other

## 2014-01-13 ENCOUNTER — Ambulatory Visit (HOSPITAL_COMMUNITY)
Admission: RE | Admit: 2014-01-13 | Discharge: 2014-01-13 | Disposition: A | Discharge status: Home / Self Care | Payer: Medicaid Other | Source: Ambulatory Visit | Attending: Family Medicine | Admitting: Family Medicine

## 2014-01-13 ENCOUNTER — Ambulatory Visit (INDEPENDENT_AMBULATORY_CARE_PROVIDER_SITE_OTHER): Payer: Medicaid Other | Admitting: Family Medicine

## 2014-01-13 ENCOUNTER — Encounter: Payer: Self-pay | Admitting: Family Medicine

## 2014-01-13 ENCOUNTER — Telehealth: Payer: Self-pay | Admitting: Family Medicine

## 2014-01-13 VITALS — BP 109/81 | HR 79 | Temp 97.6°F | Ht 68.0 in | Wt 218.0 lb

## 2014-01-13 DIAGNOSIS — E785 Hyperlipidemia, unspecified: Secondary | ICD-10-CM | POA: Insufficient documentation

## 2014-01-13 DIAGNOSIS — M545 Low back pain, unspecified: Secondary | ICD-10-CM

## 2014-01-13 DIAGNOSIS — I5022 Chronic systolic (congestive) heart failure: Secondary | ICD-10-CM

## 2014-01-13 DIAGNOSIS — J449 Chronic obstructive pulmonary disease, unspecified: Secondary | ICD-10-CM

## 2014-01-13 DIAGNOSIS — E039 Hypothyroidism, unspecified: Secondary | ICD-10-CM

## 2014-01-13 LAB — LIPID PANEL
CHOL/HDL RATIO: 4.3 ratio
CHOLESTEROL: 239 mg/dL — AB (ref 0–200)
HDL: 55 mg/dL (ref >39)
LDL Cholesterol: 159 mg/dL — ABNORMAL HIGH (ref 0–99)
TRIGLYCERIDES: 123 mg/dL (ref <150)
VLDL: 25 mg/dL (ref 0–40)

## 2014-01-13 LAB — TSH: TSH: 15.364 u[IU]/mL — ABNORMAL HIGH (ref 0.350–4.500)

## 2014-01-13 MED ORDER — LIDOCAINE 5 % EX PTCH: 1.0000 | MEDICATED_PATCH | CUTANEOUS | Status: DC

## 2014-01-13 MED ORDER — ETODOLAC ER 400 MG PO TB24: 400.0000 mg | ORAL_TABLET | Freq: Every day | ORAL | Status: DC

## 2014-01-13 NOTE — Telephone Encounter (Signed)
As discussed with patient,Lodine is an NSAID that can make BP go up if used daily but this is a prn medication. I gave option of topical pain medication and she prefers this instead,hence Lidoderm prescribed. 

## 2014-01-13 NOTE — Assessment & Plan Note (Signed)
Stable on medication

## 2014-01-13 NOTE — Assessment & Plan Note (Signed)
On diet and exercise. FLP checked today.

## 2014-01-13 NOTE — Telephone Encounter (Signed)
Pt is calling because Dr. Gwendlyn Deutscher prescribed her Lodine XL. She said that her granddaughter looked this up on the Internet and since she already takes medications for BP, and heart problems she is unsure if she should take this. Please call and advise. jw

## 2014-01-13 NOTE — Progress Notes (Signed)
Subjective:     Patient ID: [name], female   DOB: 16-Mar-1956, 58 y.o.   MRN: [id number]  HPI CHF/COPD:Patient denies any concern today,compliant with all medication,few weeks ago she had SOB and occasional palpitations and skipped beat,but was cleared by her cardiologist after being assessed.Feels ok today. Hypothyroidism:Compliant with Synthroid 71mcg qd,here for f/u. HLD:Not on medication,here follow. Back pain:C/O of low back pain that started 3 days ago after she fell on a wooded floor, since then she has had pain of 10/10 in severity. Pain is sharp in nature,does not radiate to anywhere, no limb weakness,Tyelnol and Ibuprofen does not help. Current Outpatient Prescriptions on File Prior to Visit  Medication Sig Dispense Refill  . albuterol (PROVENTIL HFA;VENTOLIN HFA) 108 (90 BASE) MCG/ACT inhaler Inhale 2 puffs into the lungs every 6 (six) hours as needed for wheezing or shortness of breath.      Marland Kitchen aspirin EC 81 MG tablet Take 81 mg by mouth daily.      . beclomethasone (QVAR) 80 MCG/ACT inhaler Inhale 1 puff into the lungs 2 (two) times daily.      . methocarbamol (ROBAXIN) 500 MG tablet Take 500 mg by mouth 2 (two) times daily as needed for muscle spasms.      . metoprolol succinate (TOPROL-XL) 25 MG 24 hr tablet Take 25 mg by mouth daily.      . nitrofurantoin, macrocrystal-monohydrate, (MACROBID) 100 MG capsule Take 100 mg by mouth 2 (two) times daily.      . promethazine (PHENERGAN) 25 MG tablet Take 25 mg by mouth every 6 (six) hours as needed for nausea or vomiting.       No current facility-administered medications on file prior to visit.   Past Medical History  Diagnosis Date  . Chest pain     a. s/p reported MI in 1997;  b. 09/2012 Cath: nl cors, nl LV ([place]);  c. 05/2012 Non-ischemic myoview.  . Hypertension   . Arthritis   . Problem with literacy   . Shortness of breath   . Anxiety   . Bronchitis   . Chronic headaches   . Obesity   . COPD (chronic  obstructive pulmonary disease)   . Chronic diastolic CHF (congestive heart failure)     a. 12/2011 Echo: nl ef, Gr 1 DD;  b. 11/2012 Echo: EF 45-50%, no significant valvular abnormalities.  Marland Kitchen GERD (gastroesophageal reflux disease)   . Myocardial infarction 1997    self reported  . Hypothyroidism   . Asthma   . Chronic chest pain       Review of Systems  Respiratory: Negative.   Cardiovascular: Negative.   Musculoskeletal: Positive for back pain.  All other systems reviewed and are negative.   Filed Vitals:   01/13/14 1347  BP: 109/81  Pulse: 79  Temp: 97.6 F (36.4 C)  TempSrc: Oral  Height: 5\' 8"  (1.727 m)  Weight: 218 lb (98.884 kg)       Objective:   Physical Exam  Nursing note and vitals reviewed. Constitutional: She appears well-developed. No distress.  Cardiovascular: Normal rate, regular rhythm, normal heart sounds and intact distal pulses.   No murmur heard. Pulmonary/Chest: Effort normal and breath sounds normal. No respiratory distress. She has no wheezes.  Abdominal: Soft. Bowel sounds are normal. She exhibits no distension and no mass. There is no tenderness.  Musculoskeletal: She exhibits no edema.       Lumbar back: She exhibits decreased range of motion and tenderness.  Assessment:     CHF COPD: Hypothyroidism: HTN HLD: Back pain    Plan:     Check problem list

## 2014-01-13 NOTE — Patient Instructions (Signed)
Back Exercises  Back exercises help treat and prevent back injuries. The goal is to increase your strength in your belly (abdominal) and back muscles. These exercises can also help with flexibility. Start these exercises when told by your doctor.  HOME CARE  Back exercises include:  Pelvic Tilt.  · Lie on your back with your knees bent. Tilt your pelvis until the lower part of your back is against the floor. Hold this position 5 to 10 sec. Repeat this exercise 5 to 10 times.  Knee to Chest.  · Pull 1 knee up against your chest and hold for 20 to 30 seconds. Repeat this with the other knee. This may be done with the other leg straight or bent, whichever feels better. Then, pull both knees up against your chest.  Sit-Ups or Curl-Ups.  · Bend your knees 90 degrees. Start with tilting your pelvis, and do a partial, slow sit-up. Only lift your upper half 30 to 45 degrees off the floor. Take at least 2 to 3 seonds for each sit-up. Do not do sit-ups with your knees out straight. If partial sit-ups are difficult, simply do the above but with only tightening your belly (abdominal) muscles and holding it as told.  Hip-Lift.  · Lie on your back with your knees flexed 90 degrees. Push down with your feet and shoulders as you raise your hips 2 inches off the floor. Hold for 10 seconds, repeat 5 to 10 times.  Back Arches.  · Lie on your stomach. Prop yourself up on bent elbows. Slowly press on your hands, causing an arch in your low back. Repeat 3 to 5 times.  Shoulder-Lifts.  · Lie face down with arms beside your body. Keep hips and belly pressed to floor as you slowly lift your head and shoulders off the floor.  Do not overdo your exercises. Be careful in the beginning. Exercises may cause you some mild back discomfort. If the pain lasts for more than 15 minutes, stop the exercises until you see your doctor. Improvement with exercise for back problems is slow.   Document Released: 01/04/2011 Document Revised: 02/24/2012  Document Reviewed: 10/03/2011  Exit[employer]® Patient Information ©2014 [employer].

## 2014-01-13 NOTE — Assessment & Plan Note (Signed)
TSH checked today. May adjust Synthroid dose if  Not therapeutic.

## 2014-01-13 NOTE — Assessment & Plan Note (Signed)
No acute change in symptom. Compliant with Metoprolol. Continue current regimen.

## 2014-01-13 NOTE — Telephone Encounter (Signed)
As discussed with patient,Lodine is an NSAID that can make BP go up if used daily but this is a prn medication. I gave option of topical pain medication and she prefers this instead,hence Lidoderm prescribed.

## 2014-01-13 NOTE — Assessment & Plan Note (Signed)
Likely due to trauma. R/O fracture though unlikely. Xray lumbar spine ordered. Etodolac prn pain ordered. F/U in 2 wks for reassessment.

## 2014-01-14 ENCOUNTER — Telehealth: Payer: Self-pay | Admitting: Family Medicine

## 2014-01-14 MED ORDER — ATORVASTATIN CALCIUM 40 MG PO TABS: 40.0000 mg | ORAL_TABLET | Freq: Every day | ORAL | Status: DC

## 2014-01-14 MED ORDER — LEVOTHYROXINE SODIUM 75 MCG PO TABS: 75.0000 ug | ORAL_TABLET | Freq: Every day | ORAL | Status: DC

## 2014-01-14 NOTE — Telephone Encounter (Signed)
Patient states that patch for pain is not covered by Medicaid. Will need something else.

## 2014-01-14 NOTE — Telephone Encounter (Signed)
I tried to prior authorize it, we can have her try oral steroid for her pain since she is allergic to most other medication.

## 2014-01-14 NOTE — Telephone Encounter (Signed)
Pt is aware of this. Annalia Metzger,CMA  

## 2014-01-14 NOTE — Telephone Encounter (Signed)
Lipid profile elevated, TSH improved from 22 to 15. Plan to start Statin and go up on Synthroid to 36mcg, patient agreed with plan and verbalized understanding. Her medication e-scribed.

## 2014-01-14 NOTE — Telephone Encounter (Signed)
Pt states that she has had steroids in her knee with no relief and doesn't think it will work for her back.  Pt states that you mentioned percocet at her last visit and would like to try that.  Railyn House,CMA

## 2014-01-14 NOTE — Telephone Encounter (Signed)
Spoke with pt and informed her that Dr. Gwendlyn Deutscher sent in a medicated patch to help with the pain instead.  She is aware of this and is going to call the pharmacy to see if it is covered through medicaid. Adreana Coull,CMA

## 2014-01-14 NOTE — Telephone Encounter (Signed)
Pt called because she said the medication that she received that she is allergic too. jw

## 2014-01-15 NOTE — Telephone Encounter (Signed)
Also let patient know her back xray is completely normal,no fracture and no indication for narcotics,I will not be prescribing narcotic for her back pain. Flexeril is another option.

## 2014-01-15 NOTE — Telephone Encounter (Signed)
Patient has allergy to Tylenol which is contained in percocet.

## 2014-01-16 ENCOUNTER — Encounter (HOSPITAL_COMMUNITY): Payer: Self-pay | Admitting: Emergency Medicine

## 2014-01-16 ENCOUNTER — Emergency Department (HOSPITAL_COMMUNITY)
Admission: EM | Admit: 2014-01-16 | Discharge: 2014-01-16 | Disposition: A | Discharge status: Home / Self Care | Payer: Medicaid Other | Attending: Emergency Medicine | Admitting: Emergency Medicine

## 2014-01-16 DIAGNOSIS — E669 Obesity, unspecified: Secondary | ICD-10-CM | POA: Insufficient documentation

## 2014-01-16 DIAGNOSIS — J4489 Other specified chronic obstructive pulmonary disease: Secondary | ICD-10-CM | POA: Insufficient documentation

## 2014-01-16 DIAGNOSIS — J449 Chronic obstructive pulmonary disease, unspecified: Secondary | ICD-10-CM | POA: Insufficient documentation

## 2014-01-16 DIAGNOSIS — Z9889 Other specified postprocedural states: Secondary | ICD-10-CM | POA: Insufficient documentation

## 2014-01-16 DIAGNOSIS — G8929 Other chronic pain: Secondary | ICD-10-CM | POA: Insufficient documentation

## 2014-01-16 DIAGNOSIS — IMO0002 Reserved for concepts with insufficient information to code with codable children: Secondary | ICD-10-CM | POA: Insufficient documentation

## 2014-01-16 DIAGNOSIS — I252 Old myocardial infarction: Secondary | ICD-10-CM | POA: Insufficient documentation

## 2014-01-16 DIAGNOSIS — E039 Hypothyroidism, unspecified: Secondary | ICD-10-CM | POA: Insufficient documentation

## 2014-01-16 DIAGNOSIS — I5032 Chronic diastolic (congestive) heart failure: Secondary | ICD-10-CM | POA: Insufficient documentation

## 2014-01-16 DIAGNOSIS — Z7982 Long term (current) use of aspirin: Secondary | ICD-10-CM | POA: Insufficient documentation

## 2014-01-16 DIAGNOSIS — K219 Gastro-esophageal reflux disease without esophagitis: Secondary | ICD-10-CM | POA: Insufficient documentation

## 2014-01-16 DIAGNOSIS — Z87891 Personal history of nicotine dependence: Secondary | ICD-10-CM | POA: Insufficient documentation

## 2014-01-16 DIAGNOSIS — Z79899 Other long term (current) drug therapy: Secondary | ICD-10-CM | POA: Insufficient documentation

## 2014-01-16 DIAGNOSIS — M129 Arthropathy, unspecified: Secondary | ICD-10-CM | POA: Insufficient documentation

## 2014-01-16 DIAGNOSIS — I10 Essential (primary) hypertension: Secondary | ICD-10-CM | POA: Insufficient documentation

## 2014-01-16 DIAGNOSIS — M545 Low back pain, unspecified: Secondary | ICD-10-CM

## 2014-01-16 DIAGNOSIS — Z880 Allergy status to penicillin: Secondary | ICD-10-CM | POA: Insufficient documentation

## 2014-01-16 MED ORDER — OXYCODONE-ACETAMINOPHEN 5-325 MG PO TABS: 1.0000 | ORAL_TABLET | Freq: Four times a day (QID) | ORAL | Status: DC | PRN

## 2014-01-16 NOTE — ED Provider Notes (Signed)
CSN: [id number]     Arrival date & time 01/16/14  1800 History  This chart was scribed for non-physician practitioner Margarita Mail, PA-C, working with Hoy Morn, MD by Zettie Pho, ED Scribe. This patient was seen in room WTR6/WTR6 and the patient's care was started at 6:24 PM.    Chief Complaint  Patient presents with  . Back Pain   The history is provided by the patient. No language interpreter was used.   HPI Comments: [name] is a 58 y.o. female with a history of arthritis who presents to the Emergency Department complaining of a constant pain to the lower back onset last night that has been progressively worsening. She states that she has been ambulatory, but that the pain is exacerbated with walking/bearing weight and also with sitting. Patient was seen at [facility] for similar complaints on 01/11/2014 and received an x-ray of the L spine that was negative and she was discharged with anti-inflammatory medications. Patient states that Percocet and Vicodin has been effective at treating her pain in the past. She denies dysuria, hematuria, rash, bowel or bladder incontinence. Patient reports allergies to Darvocet, ibuprofen, tramdol, Tylenol, Celebrex, Naproxen, and Penicillins. Patient has a history of HTN, chronic headaches, COPD, diastolic CHF, MI, and asthma.   Past Medical History  Diagnosis Date  . Chest pain     a. s/p reported MI in 1997;  b. 09/2012 Cath: nl cors, nl LV ([place]);  c. 05/2012 Non-ischemic myoview.  . Hypertension   . Arthritis   . Problem with literacy   . Shortness of breath   . Anxiety   . Bronchitis   . Chronic headaches   . Obesity   . COPD (chronic obstructive pulmonary disease)   . Chronic diastolic CHF (congestive heart failure)     a. 12/2011 Echo: nl ef, Gr 1 DD;  b. 11/2012 Echo: EF 45-50%, no significant valvular abnormalities.  Marland Kitchen GERD (gastroesophageal reflux disease)   . Myocardial infarction 1997    self reported  .  Hypothyroidism   . Asthma   . Chronic chest pain    Past Surgical History  Procedure Laterality Date  . Ectopic pregnancy surgery      right  . Nm myoview ltd  June 2013    no reversible ischemia  . Cardiac catheterization  October 2012    No coronary artery disease  . Cardiac catheterization  2012    normal coronary arteries  . Nm myoview ltd  2012, 2013    normal  . 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 Knee arthroscopy  08/13/13    right   Family History  Problem Relation Age of Onset  . Cancer Mother 73    lung ca  . Diabetes Mother   . Anxiety disorder Sister   . Cancer Brother 60    lung  . Colon cancer Neg Hx   . Pancreatic cancer Neg Hx   . Rectal cancer Neg Hx   . Stomach cancer Neg Hx    History  Substance Use Topics  . Smoking status: Former Smoker -- 1.00 packs/day for 3 years    Quit date: 12/03/2010  . Smokeless tobacco: Never Used  . Alcohol Use: No   OB History   Grav Para Term Preterm Abortions TAB SAB Ect Mult Living                 Review  of Systems  Genitourinary: Negative for dysuria and hematuria.  Musculoskeletal: Positive for back pain.  Skin: Negative for rash.    Allergies  Darvocet; Ibuprofen; Tramadol; Tylenol; Celebrex; Naproxen; and Penicillins  Home Medications   Current Outpatient Rx  Name  Route  Sig  Dispense  Refill  . albuterol (PROVENTIL HFA;VENTOLIN HFA) 108 (90 BASE) MCG/ACT inhaler   Inhalation   Inhale 2 puffs into the lungs every 6 (six) hours as needed for wheezing or shortness of breath.         Marland Kitchen aspirin EC 81 MG tablet   Oral   Take 81 mg by mouth daily.         Marland Kitchen atorvastatin (LIPITOR) 40 MG tablet   Oral   Take 1 tablet (40 mg total) by mouth daily.   90 tablet   1   . beclomethasone (QVAR) 80 MCG/ACT inhaler   Inhalation   Inhale 1 puff into the lungs 2 (two) times daily.         Marland Kitchen levothyroxine (SYNTHROID, LEVOTHROID) 75 MCG tablet   Oral   Take 1  tablet (75 mcg total) by mouth daily before breakfast.   90 tablet   1   . lidocaine (LIDODERM) 5 %   Transdermal   Place 1 patch onto the skin daily. Remove & Discard patch within 12 hours or as directed by MD   30 patch   0   . methocarbamol (ROBAXIN) 500 MG tablet   Oral   Take 500 mg by mouth 2 (two) times daily as needed for muscle spasms.         . metoprolol succinate (TOPROL-XL) 25 MG 24 hr tablet   Oral   Take 25 mg by mouth daily.         . nitrofurantoin, macrocrystal-monohydrate, (MACROBID) 100 MG capsule   Oral   Take 100 mg by mouth 2 (two) times daily.         . promethazine (PHENERGAN) 25 MG tablet   Oral   Take 25 mg by mouth every 6 (six) hours as needed for nausea or vomiting.          Triage Vitals: BP 102/83  Pulse 75  Temp(Src) 97.9 F (36.6 C) (Oral)  Resp 17  SpO2 97%  Physical Exam  Nursing note and vitals reviewed. Constitutional: She is oriented to person, place, and time. She appears well-developed and well-nourished. No distress.  HENT:  Head: Normocephalic and atraumatic.  Eyes: Conjunctivae are normal.  Neck: Normal range of motion. Neck supple.  Pulmonary/Chest: Effort normal. No respiratory distress.  Abdominal: She exhibits no distension.  Musculoskeletal: Normal range of motion.  Neurological: She is alert and oriented to person, place, and time.  Skin: Skin is warm and dry.  Psychiatric: She has a normal mood and affect. Her behavior is normal.    ED Course  Procedures (including critical care time)  DIAGNOSTIC STUDIES: Oxygen Saturation is 97% on room air, normal by my interpretation.    COORDINATION OF CARE: 6:28 PM- Will discharge patient with a short course of Percocet to manage symptoms. Advised patient to follow up with her PCP. Discussed treatment plan with patient at bedside and patient verbalized agreement.     Labs Review Labs Reviewed - No data to display Imaging Review No results found.  EKG  Interpretation   None       MDM   1. Low back pain    Patient with back pain.  No neurological  deficits and normal neuro exam.  Patient can walk but states is painful.  No loss of bowel or bladder control.  No concern for cauda equina.  No fever, night sweats, weight loss, h/o cancer, IVDU.  RICE protocol and pain medicine indicated and discussed with patient.   I personally performed the services described in this documentation, which was scribed in my presence. The recorded information has been reviewed and is accurate.      Margarita Mail, PA-C 01/19/14 1130

## 2014-01-16 NOTE — ED Notes (Signed)
Pt from home reports that she was taking trash out, slipped on step, fell onto her bottom. Pt states that since she has had back pain that has worsened. Pt denies urinary s/sx. Pt reports difficulty walking. Pt is A&O and in NAD

## 2014-01-16 NOTE — Discharge Instructions (Signed)
SEEK IMMEDIATE MEDICAL ATTENTION IF: °New numbness, tingling, weakness, or problem with the use of your arms or legs.  °Severe back pain not relieved with medications.  °Change in bowel or bladder control.  °Increasing pain in any areas of the body (such as chest or abdominal pain).  °Shortness of breath, dizziness or fainting.  °Nausea (feeling sick to your stomach), vomiting, fever, or sweats. ° ° ° °Back Pain, Adult °Low back pain is very common. About 1 in 5 people have back pain. The cause of low back pain is rarely dangerous. The pain often gets better over time. About half of people with a sudden onset of back pain feel better in just 2 weeks. About 8 in 10 people feel better by 6 weeks.  °CAUSES °Some common causes of back pain include: °· Strain of the muscles or ligaments supporting the spine. °· Wear and tear (degeneration) of the spinal discs. °· Arthritis. °· Direct injury to the back. °DIAGNOSIS °Most of the time, the direct cause of low back pain is not known. However, back pain can be treated effectively even when the exact cause of the pain is unknown. Answering your caregiver's questions about your overall health and symptoms is one of the most accurate ways to make sure the cause of your pain is not dangerous. If your caregiver needs more information, he or she may order lab work or imaging tests (X-rays or MRIs). However, even if imaging tests show changes in your back, this usually does not require surgery. °HOME CARE INSTRUCTIONS °For many people, back pain returns. Since low back pain is rarely dangerous, it is often a condition that people can learn to manage on their own.  °· Remain active. It is stressful on the back to sit or stand in one place. Do not sit, drive, or stand in one place for more than 30 minutes at a time. Take short walks on level surfaces as soon as pain allows. Try to increase the length of time you walk each day. °· Do not stay in bed. Resting more than 1 or 2 days can  delay your recovery. °· Do not avoid exercise or work. Your body is made to move. It is not dangerous to be active, even though your back may hurt. Your back will likely heal faster if you return to being active before your pain is gone. °· Pay attention to your body when you  bend and lift. Many people have less discomfort when lifting if they bend their knees, keep the load close to their bodies, and avoid twisting. Often, the most comfortable positions are those that put less stress on your recovering back. °· Find a comfortable position to sleep. Use a firm mattress and lie on your side with your knees slightly bent. If you lie on your back, put a pillow under your knees. °· Only take over-the-counter or prescription medicines as directed by your caregiver. Over-the-counter medicines to reduce pain and inflammation are often the most helpful. Your caregiver may prescribe muscle relaxant drugs. These medicines help dull your pain so you can more quickly return to your normal activities and healthy exercise. °· Put ice on the injured area. °· Put ice in a plastic bag. °· Place a towel between your skin and the bag. °· Leave the ice on for 15-20 minutes, 03-04 times a day for the first 2 to 3 days. After that, ice and heat may be alternated to reduce pain and spasms. °· Ask your caregiver about trying back exercises and gentle massage. This may be of some benefit. °· Avoid feeling anxious   or stressed. Stress increases muscle tension and can worsen back pain. It is important to recognize when you are anxious or stressed and learn ways to manage it. Exercise is a great option. °SEEK MEDICAL CARE IF: °· You have pain that is not relieved with rest or medicine. °· You have pain that does not improve in 1 week. °· You have new symptoms. °· You are generally not feeling well. °SEEK IMMEDIATE MEDICAL CARE IF:  °· You have pain that radiates from your back into your legs. °· You develop new bowel or bladder control  problems. °· You have unusual weakness or numbness in your arms or legs. °· You develop nausea or vomiting. °· You develop abdominal pain. °· You feel faint. °Document Released: 12/02/2005 Document Revised: 06/02/2012 Document Reviewed: 04/22/2011 °ExitCare® Patient Information ©2014 [employer]. ° °

## 2014-01-17 ENCOUNTER — Other Ambulatory Visit: Payer: Self-pay | Admitting: Family Medicine

## 2014-01-17 MED ORDER — CYCLOBENZAPRINE HCL 5 MG PO TABS: 5.0000 mg | ORAL_TABLET | Freq: Three times a day (TID) | ORAL | Status: DC | PRN

## 2014-01-17 NOTE — Telephone Encounter (Signed)
Ok will prescribe flexeril

## 2014-01-17 NOTE — Telephone Encounter (Signed)
Pt is fine with trying flexeril. Jazmin Hartsell,CMA

## 2014-01-20 NOTE — ED Provider Notes (Signed)
Medical screening examination/treatment/procedure(s) were performed by non-physician practitioner and as supervising physician I was immediately available for consultation/collaboration.  EKG Interpretation   None         Kellene Mccleary M Leland Raver, MD 01/20/14 0305 

## 2014-01-21 ENCOUNTER — Telehealth: Payer: Self-pay | Admitting: Internal Medicine

## 2014-01-21 ENCOUNTER — Encounter (HOSPITAL_COMMUNITY): Payer: Self-pay | Admitting: Emergency Medicine

## 2014-01-21 ENCOUNTER — Emergency Department (HOSPITAL_COMMUNITY): Payer: Medicaid Other

## 2014-01-21 ENCOUNTER — Emergency Department (HOSPITAL_COMMUNITY)
Admission: EM | Admit: 2014-01-21 | Discharge: 2014-01-21 | Disposition: A | Discharge status: Home / Self Care | Payer: Medicaid Other | Attending: Emergency Medicine | Admitting: Emergency Medicine

## 2014-01-21 DIAGNOSIS — Z880 Allergy status to penicillin: Secondary | ICD-10-CM | POA: Insufficient documentation

## 2014-01-21 DIAGNOSIS — I252 Old myocardial infarction: Secondary | ICD-10-CM | POA: Insufficient documentation

## 2014-01-21 DIAGNOSIS — M129 Arthropathy, unspecified: Secondary | ICD-10-CM | POA: Insufficient documentation

## 2014-01-21 DIAGNOSIS — Z87891 Personal history of nicotine dependence: Secondary | ICD-10-CM | POA: Insufficient documentation

## 2014-01-21 DIAGNOSIS — Z7982 Long term (current) use of aspirin: Secondary | ICD-10-CM | POA: Insufficient documentation

## 2014-01-21 DIAGNOSIS — E669 Obesity, unspecified: Secondary | ICD-10-CM | POA: Insufficient documentation

## 2014-01-21 DIAGNOSIS — IMO0002 Reserved for concepts with insufficient information to code with codable children: Secondary | ICD-10-CM | POA: Insufficient documentation

## 2014-01-21 DIAGNOSIS — E039 Hypothyroidism, unspecified: Secondary | ICD-10-CM | POA: Insufficient documentation

## 2014-01-21 DIAGNOSIS — R079 Chest pain, unspecified: Secondary | ICD-10-CM | POA: Insufficient documentation

## 2014-01-21 DIAGNOSIS — J441 Chronic obstructive pulmonary disease with (acute) exacerbation: Secondary | ICD-10-CM | POA: Insufficient documentation

## 2014-01-21 DIAGNOSIS — Z8719 Personal history of other diseases of the digestive system: Secondary | ICD-10-CM | POA: Insufficient documentation

## 2014-01-21 DIAGNOSIS — I10 Essential (primary) hypertension: Secondary | ICD-10-CM | POA: Insufficient documentation

## 2014-01-21 DIAGNOSIS — I5032 Chronic diastolic (congestive) heart failure: Secondary | ICD-10-CM | POA: Insufficient documentation

## 2014-01-21 DIAGNOSIS — M545 Low back pain, unspecified: Secondary | ICD-10-CM

## 2014-01-21 DIAGNOSIS — G8929 Other chronic pain: Secondary | ICD-10-CM | POA: Insufficient documentation

## 2014-01-21 DIAGNOSIS — J45901 Unspecified asthma with (acute) exacerbation: Secondary | ICD-10-CM | POA: Insufficient documentation

## 2014-01-21 DIAGNOSIS — Z8659 Personal history of other mental and behavioral disorders: Secondary | ICD-10-CM | POA: Insufficient documentation

## 2014-01-21 DIAGNOSIS — Z9889 Other specified postprocedural states: Secondary | ICD-10-CM | POA: Insufficient documentation

## 2014-01-21 DIAGNOSIS — R0602 Shortness of breath: Secondary | ICD-10-CM

## 2014-01-21 DIAGNOSIS — Z79899 Other long term (current) drug therapy: Secondary | ICD-10-CM | POA: Insufficient documentation

## 2014-01-21 LAB — CBC
HEMATOCRIT: 41.2 % (ref 36.0–46.0)
Hemoglobin: 14 g/dL (ref 12.0–15.0)
MCH: 29 pg (ref 26.0–34.0)
MCHC: 34 g/dL (ref 30.0–36.0)
MCV: 85.5 fL (ref 78.0–100.0)
PLATELETS: 275 10*3/uL (ref 150–400)
RBC: 4.82 MIL/uL (ref 3.87–5.11)
RDW: 15.1 % (ref 11.5–15.5)
WBC: 6.6 10*3/uL (ref 4.0–10.5)

## 2014-01-21 LAB — BASIC METABOLIC PANEL
BUN: 9 mg/dL (ref 6–23)
CALCIUM: 9.2 mg/dL (ref 8.4–10.5)
CO2: 23 mEq/L (ref 19–32)
Chloride: 103 mEq/L (ref 96–112)
Creatinine, Ser: 0.72 mg/dL (ref 0.50–1.10)
GFR calc Af Amer: >90 mL/min (ref >90)
GFR calc non Af Amer: >90 mL/min (ref >90)
Glucose, Bld: 88 mg/dL (ref 70–99)
Potassium: 3.9 mEq/L (ref 3.7–5.3)
Sodium: 142 mEq/L (ref 137–147)

## 2014-01-21 LAB — POCT I-STAT TROPONIN I: TROPONIN I, POC: 0.01 ng/mL (ref 0.00–0.08)

## 2014-01-21 MED ORDER — OXYCODONE-ACETAMINOPHEN 5-325 MG PO TABS
1.0000 | ORAL_TABLET | Freq: Once | ORAL | Status: AC
  Administered 2014-01-21: 1 via ORAL
  Filled 2014-01-21: qty 1

## 2014-01-21 NOTE — ED Notes (Signed)
Pt in c/o back pain between her shoulder blades, shortness of breath and admits to chest pain when asked. Pt states she feels like her heart is fluttering. Denies other symptoms with this.

## 2014-01-21 NOTE — Telephone Encounter (Signed)
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 She reports they are unchanged since beginning the toprol xl 25mg  daily. She reports they happen usually about twice a day, lasting about 5-10 minutes and "take my breath away".  She does not have a way to check her BP and HR.  I rec she go to [employer] to do some checks and keep a record of them, so that in case her Dr. decides to increase her toprol he will have an idea of what her HR and BP are.   Educated her that while PVCs are bothersome they are not typically harmful.  Told her I would let Dr. Rayann Heman know all this.

## 2014-01-21 NOTE — Discharge Instructions (Signed)
Back Pain, Adult Low back pain is very common. About 1 in 5 people have back pain.The cause of low back pain is rarely dangerous. The pain often gets better over time.About half of people with a sudden onset of back pain feel better in just 2 weeks. About 8 in 10 people feel better by 6 weeks.  CAUSES Some common causes of back pain include:  Strain of the muscles or ligaments supporting the spine.  Wear and tear (degeneration) of the spinal discs.  Arthritis.  Direct injury to the back. DIAGNOSIS Most of the time, the direct cause of low back pain is not known.However, back pain can be treated effectively even when the exact cause of the pain is unknown.Answering your caregiver's questions about your overall health and symptoms is one of the most accurate ways to make sure the cause of your pain is not dangerous. If your caregiver needs more information, he or she may order lab work or imaging tests (X-rays or MRIs).However, even if imaging tests show changes in your back, this usually does not require surgery. HOME CARE INSTRUCTIONS For many people, back pain returns.Since low back pain is rarely dangerous, it is often a condition that people can learn to [facility] their own.   Remain active. It is stressful on the back to sit or stand in one place. Do not sit, drive, or stand in one place for more than 30 minutes at a time. Take short walks on level surfaces as soon as pain allows.Try to increase the length of time you walk each day.  Do not stay in bed.Resting more than 1 or 2 days can delay your recovery.  Do not avoid exercise or work.Your body is made to move.It is not dangerous to be active, even though your back may hurt.Your back will likely heal faster if you return to being active before your pain is gone.  Pay attention to your body when you bend and lift. Many people have less discomfortwhen lifting if they bend their knees, keep the load close to their bodies,and  avoid twisting. Often, the most comfortable positions are those that put less stress on your recovering back.  Find a comfortable position to sleep. Use a firm mattress and lie on your side with your knees slightly bent. If you lie on your back, put a pillow under your knees.  Only take over-the-counter or prescription medicines as directed by your caregiver. Over-the-counter medicines to reduce pain and inflammation are often the most helpful.Your caregiver may prescribe muscle relaxant drugs.These medicines help dull your pain so you can more quickly return to your normal activities and healthy exercise.  Put ice on the injured area.  Put ice in a plastic bag.  Place a towel between your skin and the bag.  Leave the ice on for 15-20 minutes, 03-04 times a day for the first 2 to 3 days. After that, ice and heat may be alternated to reduce pain and spasms.  Ask your caregiver about trying back exercises and gentle massage. This may be of some benefit.  Avoid feeling anxious or stressed.Stress increases muscle tension and can worsen back pain.It is important to recognize when you are anxious or stressed and learn ways to manage it.Exercise is a great option. SEEK MEDICAL CARE IF:  You have pain that is not relieved with rest or medicine.  You have pain that does not improve in 1 week.  You have new symptoms.  You are generally not feeling well. SEEK  IMMEDIATE MEDICAL CARE IF:   You have pain that radiates from your back into your legs.  You develop new bowel or bladder control problems.  You have unusual weakness or numbness in your arms or legs.  You develop nausea or vomiting.  You develop abdominal pain.  You feel faint. Document Released: 12/02/2005 Document Revised: 06/02/2012 Document Reviewed: 04/22/2011 [facility] Patient Information 2014 [place], [place].  Shortness of Breath Shortness of breath means you have trouble breathing. Shortness of breath may indicate  that you have a medical problem. You should seek immediate medical care for shortness of breath. CAUSES   Not enough oxygen in the air (as with high altitudes or a smoke-filled room).  Short-term (acute) lung disease, including:  Infections, such as pneumonia.  Fluid in the lungs, such as heart failure.  A blood clot in the lungs (pulmonary embolism).  Long-term (chronic) lung diseases.  Heart disease (heart attack, angina, heart failure, and others).  Low red blood cells (anemia).  Poor physical fitness. This can cause shortness of breath when you exercise.  Chest or back injuries or stiffness.  Being overweight.  Smoking.  Anxiety. This can make you feel like you are not getting enough air. DIAGNOSIS  Serious medical problems can usually be found during your physical exam. Tests may also be done to determine why you are having shortness of breath. Tests may include:  Chest X-rays.  Lung function tests.  Blood tests.  Electrocardiography.  Exercise testing.  Echocardiography.  Imaging scans. Your caregiver may not be able to find a cause for your shortness of breath after your exam. In this case, it is important to have a follow-up exam with your caregiver as directed.  TREATMENT  Treatment for shortness of breath depends on the cause of your symptoms and can vary greatly. HOME CARE INSTRUCTIONS   Do not smoke. Smoking is a common cause of shortness of breath. If you smoke, ask for help to quit.  Avoid being around chemicals or things that may bother your breathing, such as paint fumes and dust.  Rest as needed. Slowly resume your usual activities.  If medicines were prescribed, take them as directed for the full length of time directed. This includes oxygen and any inhaled medicines.  Keep all follow-up appointments as directed by your caregiver. SEEK MEDICAL CARE IF:   Your condition does not improve in the time expected.  You have a hard time doing  your normal activities even with rest.  You have any side effects or problems with the medicines prescribed.  You develop any new symptoms. SEEK IMMEDIATE MEDICAL CARE IF:   Your shortness of breath gets worse.  You feel lightheaded, faint, or develop a cough not controlled with medicines.  You start coughing up blood.  You have pain with breathing.  You have chest pain or pain in your arms, shoulders, or abdomen.  You have a fever.  You are unable to walk up stairs or exercise the way you normally do. MAKE SURE YOU:  Understand these instructions.  Will watch your condition.  Will get help right away if you are not doing well or get worse. Document Released: 08/27/2001 Document Revised: 06/02/2012 Document Reviewed: 02/17/2012 [facility] Patient Information 2014 [place].

## 2014-01-21 NOTE — ED Provider Notes (Signed)
CSN: [id number]     Arrival date & time 01/21/14  1530 History   First MD Initiated Contact with Patient 01/21/14 1552     Chief Complaint  Patient presents with  . Back Pain  . Shortness of Breath   (Consider location/radiation/quality/duration/timing/severity/associated sxs/prior Treatment) HPI Comments: 58 year old white female with multiple medical problems to include chest pain, hypertension, arthritis, shortness of breath, anxiety, chronic headaches, chronic obstructive pulmonary disease, GERD, history of MI self-reported, hypothyroidism, chronic chest pain, asthma presents with back pain. Patient has been seen numerous times within the [facility] system emergency departments for similar complaints.  Past notes were reviewed.  Pt has been evaluated for same.  Today she c/o pain in thoracic spine area.  She denies trauma.  She does report chronic SOB.    Patient is a 58 y.o. female presenting with back pain. The history is provided by the patient.  Back Pain Location:  Thoracic spine Quality:  Aching Radiates to:  Does not radiate Pain severity:  Moderate Pain is:  Worse during the day Onset quality:  Gradual Duration:  4 days Timing:  Intermittent Progression:  Waxing and waning Chronicity:  Chronic Relieved by:  None tried Worsened by:  Nothing tried Ineffective treatments:  None tried Associated symptoms: chest pain   Associated symptoms: no abdominal pain, no abdominal swelling, no bladder incontinence, no bowel incontinence, no dysuria, no fever, no headaches, no leg pain, no numbness, no paresthesias and no pelvic pain     Past Medical History  Diagnosis Date  . Chest pain     a. s/p reported MI in 1997;  b. 09/2012 Cath: nl cors, nl LV ([place]);  c. 05/2012 Non-ischemic myoview.  . Hypertension   . Arthritis   . Problem with literacy   . Shortness of breath   . Anxiety   . Bronchitis   . Chronic headaches   . Obesity   . COPD (chronic obstructive pulmonary  disease)   . Chronic diastolic CHF (congestive heart failure)     a. 12/2011 Echo: nl ef, Gr 1 DD;  b. 11/2012 Echo: EF 45-50%, no significant valvular abnormalities.  Marland Kitchen GERD (gastroesophageal reflux disease)   . Myocardial infarction 1997    self reported  . Hypothyroidism   . Asthma   . Chronic chest pain    Past Surgical History  Procedure Laterality Date  . Ectopic pregnancy surgery      right  . Nm myoview ltd  June 2013    no reversible ischemia  . Cardiac catheterization  October 2012    No coronary artery disease  . Cardiac catheterization  2012    normal coronary arteries  . Nm myoview ltd  2012, 2013    normal  . 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 Knee arthroscopy  08/13/13    right   Family History  Problem Relation Age of Onset  . Cancer Mother 1    lung ca  . Diabetes Mother   . Anxiety disorder Sister   . Cancer Brother 60    lung  . Colon cancer Neg Hx   . Pancreatic cancer Neg Hx   . Rectal cancer Neg Hx   . Stomach cancer Neg Hx    History  Substance Use Topics  . Smoking status: Former Smoker -- 1.00 packs/day for 3 years    Quit date: 12/03/2010  . Smokeless tobacco: Never Used  .  Alcohol Use: No   OB History   Grav Para Term Preterm Abortions TAB SAB Ect Mult Living                 Review of Systems  Constitutional: Negative.  Negative for fever, chills, diaphoresis, appetite change and fatigue.  HENT: Negative.   Eyes: Negative.   Respiratory: Positive for shortness of breath. Negative for apnea, cough, choking, chest tightness, wheezing and stridor.   Cardiovascular: Positive for chest pain. Negative for palpitations and leg swelling.       Mild chronic CP reported  Gastrointestinal: Negative.  Negative for nausea, vomiting, abdominal pain, diarrhea, blood in stool, anal bleeding, rectal pain and bowel incontinence.  Endocrine: Negative.   Genitourinary: Negative.  Negative for bladder  incontinence, dysuria, urgency, frequency, flank pain, decreased urine volume, vaginal bleeding, vaginal discharge, enuresis, difficulty urinating, genital sores, vaginal pain, pelvic pain and dyspareunia.  Musculoskeletal: Positive for back pain. Negative for arthralgias, gait problem, joint swelling, myalgias and neck pain.  Skin: Negative.   Allergic/Immunologic: Negative.   Neurological: Negative.  Negative for seizures, syncope, facial asymmetry, speech difficulty, light-headedness, numbness, headaches and paresthesias.  Hematological: Negative.     Allergies  Darvocet; Ibuprofen; Tramadol; Tylenol; Celebrex; Naproxen; and Penicillins  Home Medications   Current Outpatient Rx  Name  Route  Sig  Dispense  Refill  . albuterol (PROVENTIL HFA;VENTOLIN HFA) 108 (90 BASE) MCG/ACT inhaler   Inhalation   Inhale 2 puffs into the lungs every 6 (six) hours as needed for wheezing or shortness of breath.         Marland Kitchen aspirin EC 81 MG tablet   Oral   Take 81 mg by mouth daily.         Marland Kitchen atorvastatin (LIPITOR) 40 MG tablet   Oral   Take 40 mg by mouth daily.         . beclomethasone (QVAR) 80 MCG/ACT inhaler   Inhalation   Inhale 2 puffs into the lungs 2 (two) times daily.          . cyclobenzaprine (FLEXERIL) 5 MG tablet   Oral   Take 5 mg by mouth 3 (three) times daily as needed for muscle spasms.         Marland Kitchen levothyroxine (SYNTHROID, LEVOTHROID) 75 MCG tablet   Oral   Take 75 mcg by mouth daily before breakfast.         . metoprolol succinate (TOPROL-XL) 25 MG 24 hr tablet   Oral   Take 25 mg by mouth daily.         Marland Kitchen oxyCODONE-acetaminophen (PERCOCET/ROXICET) 5-325 MG per tablet   Oral   Take 1 tablet by mouth every 6 (six) hours as needed for severe pain.         . promethazine (PHENERGAN) 25 MG tablet   Oral   Take 25 mg by mouth every 6 (six) hours as needed for nausea or vomiting.          BP 129/74  Pulse 56  Temp(Src) 97.7 F (36.5 C) (Oral)  Resp  16  Wt 217 lb 14.4 oz (98.839 kg)  SpO2 100% Physical Exam  Nursing note and vitals reviewed. Constitutional: She appears well-developed and well-nourished. No distress.  HENT:  Head: Normocephalic and atraumatic.  Right Ear: External ear normal.  Left Ear: External ear normal.  Nose: Nose normal.  Mouth/Throat: Oropharynx is clear and moist.  Eyes: Conjunctivae and EOM are normal. Pupils are equal, round, and reactive to  light. Right eye exhibits no discharge. Left eye exhibits no discharge.  Neck: Neck supple. No JVD present. No tracheal deviation present.  Cardiovascular: Normal rate and regular rhythm.  Exam reveals no friction rub.   No murmur heard. Pulmonary/Chest: Breath sounds normal. No stridor. No respiratory distress. She has no wheezes. She has no rales.  Abdominal: Soft. Bowel sounds are normal. She exhibits no distension and no mass. There is no tenderness. There is no rebound and no guarding.  Musculoskeletal: Normal range of motion. She exhibits no edema.       Thoracic back: She exhibits tenderness and pain. She exhibits no bony tenderness, no swelling, no edema, no deformity, no laceration, no spasm and normal pulse.  No midline TTP, no stepoff or deviation, no bony ttp  Skin: She is not diaphoretic.    ED Course  Procedures (including critical care time) Labs Review Labs Reviewed  BASIC METABOLIC PANEL  CBC  POCT I-STAT TROPONIN I   Imaging Review Dg Chest 2 View  01/21/2014   CLINICAL DATA:  Interscapular back pain. Chest pain. Shortness of breath. Current history of hypertension an asthma. Former long-time smoker who quit approximately 4 years ago.  EXAM: CHEST  2 VIEW  COMPARISON:  DG CHEST 2 VIEW dated 01/08/2014; DG CHEST 2 VIEW dated 12/28/2013; DG CHEST 2 VIEW dated 12/20/2013; CT ANGIO CHEST W/CM &/OR WO/CM dated 12/02/2013; DG CHEST 2 VIEW dated 02/21/2013  FINDINGS: Cardiomediastinal silhouette unremarkable and unchanged. Mildly prominent bronchovascular  markings and mild central peribronchial thickening, unchanged. Lungs otherwise clear. No localized airspace consolidation. No pleural effusions. No pneumothorax. Normal pulmonary vascularity. Visualized bony thorax intact.  IMPRESSION: Stable mild changes of chronic bronchitis and/or asthma. No acute cardiopulmonary disease.   Electronically Signed   By: Evangeline Dakin M.D.   On: 01/21/2014 16:49    EKG Interpretation    Date/Time:  Friday January 21 2014 15:38:56 EST Ventricular Rate:  78 PR Interval:  148 QRS Duration: 80 QT Interval:  406 QTC Calculation: 462 R Axis:   36 Text Interpretation:  Normal sinus rhythm Normal ECG Confirmed by MASNERI  MD, DAVID ([id number]) on 01/21/2014 4:02:05 PM            MDM   1. Low back pain   2. Shortness of breath    58 year old white female presents emergency department with chief complaint of thoracic back pain, shortness of breath, and generalized malaise. Patient has chronic pain, shortness of breath, for which she has been seen several times by her primary care provider and in the emergency department. Her symptoms have been ongoing and I feel a single EKG and troponin are appropriate at this time. She is afebrile and her vital signs are stable. Of note her pulse ox is 100% on room air and she is not tachycardic Chest x-ray without acute changes. Patient's symptoms improved with Percocet times one.  At this point I feel it is appropriate to discharge patient home with followup with her primary care provider. I do not suspect ACS, PE, COPD exacerbation, serious bacterial illness, fracture, or other emergent etiology. She should followup with her primary care provider and may benefit from a further outpatient workup. I had a lengthy discussion with the patient agrees with this plan. No new medications were prescribed during this visit.    Elmer Sow, MD 01/21/14 [phone]

## 2014-01-21 NOTE — Telephone Encounter (Signed)
New problem   Pt stated the medication she was given to slow down her heartbeats isn't working. She stated when it occurs it takes her breath . Please call pt

## 2014-01-25 ENCOUNTER — Telehealth: Payer: Self-pay | Admitting: *Deleted

## 2014-01-25 NOTE — Telephone Encounter (Signed)
Called patient to remind her of hospital f/u 02/17/14.  [name], [name], [place]

## 2014-02-03 ENCOUNTER — Encounter (HOSPITAL_COMMUNITY): Payer: Self-pay | Admitting: Emergency Medicine

## 2014-02-03 ENCOUNTER — Emergency Department (HOSPITAL_COMMUNITY): Payer: Medicaid Other

## 2014-02-03 ENCOUNTER — Emergency Department (HOSPITAL_COMMUNITY)
Admission: EM | Admit: 2014-02-03 | Discharge: 2014-02-03 | Disposition: A | Discharge status: Home / Self Care | Payer: Medicaid Other | Attending: Emergency Medicine | Admitting: Emergency Medicine

## 2014-02-03 DIAGNOSIS — E039 Hypothyroidism, unspecified: Secondary | ICD-10-CM | POA: Insufficient documentation

## 2014-02-03 DIAGNOSIS — Z559 Problems related to education and literacy, unspecified: Secondary | ICD-10-CM | POA: Insufficient documentation

## 2014-02-03 DIAGNOSIS — W108XXA Fall (on) (from) other stairs and steps, initial encounter: Secondary | ICD-10-CM | POA: Insufficient documentation

## 2014-02-03 DIAGNOSIS — I5032 Chronic diastolic (congestive) heart failure: Secondary | ICD-10-CM | POA: Insufficient documentation

## 2014-02-03 DIAGNOSIS — Z7982 Long term (current) use of aspirin: Secondary | ICD-10-CM | POA: Insufficient documentation

## 2014-02-03 DIAGNOSIS — R5381 Other malaise: Secondary | ICD-10-CM | POA: Insufficient documentation

## 2014-02-03 DIAGNOSIS — S8000XA Contusion of unspecified knee, initial encounter: Secondary | ICD-10-CM | POA: Insufficient documentation

## 2014-02-03 DIAGNOSIS — Z87891 Personal history of nicotine dependence: Secondary | ICD-10-CM | POA: Insufficient documentation

## 2014-02-03 DIAGNOSIS — I10 Essential (primary) hypertension: Secondary | ICD-10-CM | POA: Insufficient documentation

## 2014-02-03 DIAGNOSIS — J441 Chronic obstructive pulmonary disease with (acute) exacerbation: Secondary | ICD-10-CM | POA: Insufficient documentation

## 2014-02-03 DIAGNOSIS — S8001XA Contusion of right knee, initial encounter: Secondary | ICD-10-CM

## 2014-02-03 DIAGNOSIS — IMO0002 Reserved for concepts with insufficient information to code with codable children: Secondary | ICD-10-CM | POA: Insufficient documentation

## 2014-02-03 DIAGNOSIS — Y929 Unspecified place or not applicable: Secondary | ICD-10-CM | POA: Insufficient documentation

## 2014-02-03 DIAGNOSIS — E669 Obesity, unspecified: Secondary | ICD-10-CM | POA: Insufficient documentation

## 2014-02-03 DIAGNOSIS — M129 Arthropathy, unspecified: Secondary | ICD-10-CM | POA: Insufficient documentation

## 2014-02-03 DIAGNOSIS — G8929 Other chronic pain: Secondary | ICD-10-CM | POA: Insufficient documentation

## 2014-02-03 DIAGNOSIS — Z8659 Personal history of other mental and behavioral disorders: Secondary | ICD-10-CM | POA: Insufficient documentation

## 2014-02-03 DIAGNOSIS — Z79899 Other long term (current) drug therapy: Secondary | ICD-10-CM | POA: Insufficient documentation

## 2014-02-03 DIAGNOSIS — J45901 Unspecified asthma with (acute) exacerbation: Secondary | ICD-10-CM

## 2014-02-03 DIAGNOSIS — I252 Old myocardial infarction: Secondary | ICD-10-CM | POA: Insufficient documentation

## 2014-02-03 DIAGNOSIS — R079 Chest pain, unspecified: Secondary | ICD-10-CM

## 2014-02-03 DIAGNOSIS — Z8719 Personal history of other diseases of the digestive system: Secondary | ICD-10-CM | POA: Insufficient documentation

## 2014-02-03 DIAGNOSIS — R5383 Other fatigue: Secondary | ICD-10-CM

## 2014-02-03 DIAGNOSIS — Z880 Allergy status to penicillin: Secondary | ICD-10-CM | POA: Insufficient documentation

## 2014-02-03 DIAGNOSIS — Y9389 Activity, other specified: Secondary | ICD-10-CM | POA: Insufficient documentation

## 2014-02-03 LAB — BASIC METABOLIC PANEL
BUN: 13 mg/dL (ref 6–23)
CO2: 25 mEq/L (ref 19–32)
Calcium: 9.7 mg/dL (ref 8.4–10.5)
Chloride: 100 mEq/L (ref 96–112)
Creatinine, Ser: 0.86 mg/dL (ref 0.50–1.10)
GFR, EST AFRICAN AMERICAN: 85 mL/min — AB (ref >90)
GFR, EST NON AFRICAN AMERICAN: 74 mL/min — AB (ref >90)
Glucose, Bld: 109 mg/dL — ABNORMAL HIGH (ref 70–99)
POTASSIUM: 3.6 meq/L — AB (ref 3.7–5.3)
Sodium: 141 mEq/L (ref 137–147)

## 2014-02-03 LAB — CBC
HEMATOCRIT: 40.1 % (ref 36.0–46.0)
Hemoglobin: 13.4 g/dL (ref 12.0–15.0)
MCH: 28.7 pg (ref 26.0–34.0)
MCHC: 33.4 g/dL (ref 30.0–36.0)
MCV: 85.9 fL (ref 78.0–100.0)
Platelets: 269 10*3/uL (ref 150–400)
RBC: 4.67 MIL/uL (ref 3.87–5.11)
RDW: 15 % (ref 11.5–15.5)
WBC: 7.4 10*3/uL (ref 4.0–10.5)

## 2014-02-03 LAB — I-STAT TROPONIN, ED: Troponin i, poc: 0.01 ng/mL (ref 0.00–0.08)

## 2014-02-03 LAB — PRO B NATRIURETIC PEPTIDE: PRO B NATRI PEPTIDE: 49.6 pg/mL (ref 0–125)

## 2014-02-03 MED ORDER — KETOROLAC TROMETHAMINE 60 MG/2ML IM SOLN
60.0000 mg | Freq: Once | INTRAMUSCULAR | Status: AC
  Administered 2014-02-03: 60 mg via INTRAMUSCULAR
  Filled 2014-02-03: qty 2

## 2014-02-03 NOTE — Discharge Instructions (Signed)
Rest, apply ice to your knee, keep it elevated.  Chest Pain (Nonspecific) It is often hard to give a specific diagnosis for the cause of chest pain. There is always a chance that your pain could be related to something serious, such as a heart attack or a blood clot in the lungs. You need to follow up with your caregiver for further evaluation. CAUSES   Heartburn.  Pneumonia or bronchitis.  Anxiety or stress.  Inflammation around your heart (pericarditis) or lung (pleuritis or pleurisy).  A blood clot in the lung.  A collapsed lung (pneumothorax). It can develop suddenly on its own (spontaneous pneumothorax) or from injury (trauma) to the chest.  Shingles infection (herpes zoster virus). The chest wall is composed of bones, muscles, and cartilage. Any of these can be the source of the pain.  The bones can be bruised by injury.  The muscles or cartilage can be strained by coughing or overwork.  The cartilage can be affected by inflammation and become sore (costochondritis). DIAGNOSIS  Lab tests or other studies, such as X-rays, electrocardiography, stress testing, or cardiac imaging, may be needed to find the cause of your pain.  TREATMENT   Treatment depends on what may be causing your chest pain. Treatment may include:  Acid blockers for heartburn.  Anti-inflammatory medicine.  Pain medicine for inflammatory conditions.  Antibiotics if an infection is present.  You may be advised to change lifestyle habits. This includes stopping smoking and avoiding alcohol, caffeine, and chocolate.  You may be advised to keep your head raised (elevated) when sleeping. This reduces the chance of acid going backward from your stomach into your esophagus.  Most of the time, nonspecific chest pain will improve within 2 to 3 days with rest and mild pain medicine. HOME CARE INSTRUCTIONS   If antibiotics were prescribed, take your antibiotics as directed. Finish them even if you start to  feel better.  For the next few days, avoid physical activities that bring on chest pain. Continue physical activities as directed.  Do not smoke.  Avoid drinking alcohol.  Only take over-the-counter or prescription medicine for pain, discomfort, or fever as directed by your caregiver.  Follow your caregiver's suggestions for further testing if your chest pain does not go away.  Keep any follow-up appointments you made. If you do not go to an appointment, you could develop lasting (chronic) problems with pain. If there is any problem keeping an appointment, you must call to reschedule. SEEK MEDICAL CARE IF:   You think you are having problems from the medicine you are taking. Read your medicine instructions carefully.  Your chest pain does not go away, even after treatment.  You develop a rash with blisters on your chest. SEEK IMMEDIATE MEDICAL CARE IF:   You have increased chest pain or pain that spreads to your arm, neck, jaw, back, or abdomen.  You develop shortness of breath, an increasing cough, or you are coughing up blood.  You have severe back or abdominal pain, feel nauseous, or vomit.  You develop severe weakness, fainting, or chills.  You have a fever. THIS IS AN EMERGENCY. Do not wait to see if the pain will go away. Get medical help at once. Call your local emergency services (911 in [place] Do not drive yourself to the hospital. MAKE SURE YOU:   Understand these instructions.  Will watch your condition.  Will get help right away if you are not doing well or get worse. Document Released: 09/11/2005 Document  Revised: 02/24/2012 Document Reviewed: 07/07/2008 [facility] Patient Information 2014 [place], [place].  Contusion A contusion is a deep bruise. Contusions are the result of an injury that caused bleeding under the skin. The contusion may turn blue, purple, or yellow. Minor injuries will give you a painless contusion, but more severe contusions may stay painful  and swollen for a few weeks.  CAUSES  A contusion is usually caused by a blow, trauma, or direct force to an area of the body. SYMPTOMS   Swelling and redness of the injured area.  Bruising of the injured area.  Tenderness and soreness of the injured area.  Pain. DIAGNOSIS  The diagnosis can be made by taking a history and physical exam. An X-ray, CT scan, or MRI may be needed to determine if there were any associated injuries, such as fractures. TREATMENT  Specific treatment will depend on what area of the body was injured. In general, the best treatment for a contusion is resting, icing, elevating, and applying cold compresses to the injured area. Over-the-counter medicines may also be recommended for pain control. Ask your caregiver what the best treatment is for your contusion. HOME CARE INSTRUCTIONS   Put ice on the injured area.  Put ice in a plastic bag.  Place a towel between your skin and the bag.  Leave the ice on for 15-20 minutes, 03-04 times a day.  Only take over-the-counter or prescription medicines for pain, discomfort, or fever as directed by your caregiver. Your caregiver may recommend avoiding anti-inflammatory medicines (aspirin, ibuprofen, and naproxen) for 48 hours because these medicines may increase bruising.  Rest the injured area.  If possible, elevate the injured area to reduce swelling. SEEK IMMEDIATE MEDICAL CARE IF:   You have increased bruising or swelling.  You have pain that is getting worse.  Your swelling or pain is not relieved with medicines. MAKE SURE YOU:   Understand these instructions.  Will watch your condition.  Will get help right away if you are not doing well or get worse. Document Released: 09/11/2005 Document Revised: 02/24/2012 Document Reviewed: 10/07/2011 [facility] Patient Information 2014 [place], [place].

## 2014-02-03 NOTE — ED Notes (Signed)
Pt states she began having chest pain last night that progressed to sob and weakness. Pt has hx of copd, chf and MI. Pt speaking in complete sentences. Pt states she fell this am on the ice when she was going down her steps. Pt states R knee hurts, pt able to bear weight.

## 2014-02-03 NOTE — ED Provider Notes (Signed)
CSN: [id number]     Arrival date & time 02/03/14  1641 History   First MD Initiated Contact with Patient 02/03/14 1644     Chief Complaint  Patient presents with  . Chest Pain  . Shortness of Breath  . Leg Pain     (Consider location/radiation/quality/duration/timing/severity/associated sxs/prior Treatment) HPI Comments: Patient is a 58 year old female with a past medical history of hypertension, anxiety, chronic headaches, COPD, chronic diastolic CHF, GERD, hypothyroidism, chronic chest pain and asthma who presents to the emergency department complaining of intermittent, non-radiating midsternal chest pain x1 day. Patient states last night while sitting at her home she began having chest pain, described as a pressure on her chest, episode lasting about 5 minutes coming every 15-20 minutes. Admits to associated shortness of breath and weakness, no change at rest or on exertion. Denies associated nausea, vomiting, diaphoresis, cough, fever or chills. Sees cardiologist is Dr. Rayann Heman, last on 1/29, no concerns at that time. On chart review, patient had a normal echocardiogram in August 2014. Patient also reports that she has a right knee pain after slipping down the stairs on the ice and landed directly onto her right knee earlier this morning. Pain currently 8/10, worse with walking. She has not tried any alleviating factors.  Patient is a 58 y.o. female presenting with chest pain, shortness of breath, and leg pain. The history is provided by the patient.  Chest Pain Associated symptoms: shortness of breath and weakness   Shortness of Breath Associated symptoms: chest pain   Leg Pain   Past Medical History  Diagnosis Date  . Chest pain     a. s/p reported MI in 1997;  b. 09/2012 Cath: nl cors, nl LV ([place]);  c. 05/2012 Non-ischemic myoview.  . Hypertension   . Arthritis   . Problem with literacy   . Shortness of breath   . Anxiety   . Bronchitis   . Chronic headaches   . Obesity   .  COPD (chronic obstructive pulmonary disease)   . Chronic diastolic CHF (congestive heart failure)     a. 12/2011 Echo: nl ef, Gr 1 DD;  b. 11/2012 Echo: EF 45-50%, no significant valvular abnormalities.  Marland Kitchen GERD (gastroesophageal reflux disease)   . Myocardial infarction 1997    self reported  . Hypothyroidism   . Asthma   . Chronic chest pain    Past Surgical History  Procedure Laterality Date  . Ectopic pregnancy surgery      right  . Nm myoview ltd  June 2013    no reversible ischemia  . Cardiac catheterization  October 2012    No coronary artery disease  . Cardiac catheterization  2012    normal coronary arteries  . Nm myoview ltd  2012, 2013    normal  . 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 Knee arthroscopy  08/13/13    right   Family History  Problem Relation Age of Onset  . Cancer Mother 67    lung ca  . Diabetes Mother   . Anxiety disorder Sister   . Cancer Brother 60    lung  . Colon cancer Neg Hx   . Pancreatic cancer Neg Hx   . Rectal cancer Neg Hx   . Stomach cancer Neg Hx    History  Substance Use Topics  . Smoking status: Former Smoker -- 1.00 packs/day for 3 years    Quit date:  12/03/2010  . Smokeless tobacco: Never Used  . Alcohol Use: No   OB History   Grav Para Term Preterm Abortions TAB SAB Ect Mult Living                 Review of Systems  Respiratory: Positive for shortness of breath.   Cardiovascular: Positive for chest pain.  Musculoskeletal:       Positive for right knee pain.  Neurological: Positive for weakness.  All other systems reviewed and are negative.      Allergies  Darvocet; Ibuprofen; Tramadol; Tylenol; Celebrex; Naproxen; and Penicillins  Home Medications   Current Outpatient Rx  Name  Route  Sig  Dispense  Refill  . aspirin EC 81 MG tablet   Oral   Take 81 mg by mouth daily.         Marland Kitchen atorvastatin (LIPITOR) 40 MG tablet   Oral   Take 40 mg by mouth daily.         .  beclomethasone (QVAR) 80 MCG/ACT inhaler   Inhalation   Inhale 2 puffs into the lungs 2 (two) times daily.          Marland Kitchen levothyroxine (SYNTHROID, LEVOTHROID) 75 MCG tablet   Oral   Take 75 mcg by mouth daily before breakfast.         . metoprolol succinate (TOPROL-XL) 25 MG 24 hr tablet   Oral   Take 25 mg by mouth daily.         . promethazine (PHENERGAN) 25 MG tablet   Oral   Take 25 mg by mouth every 6 (six) hours as needed for nausea or vomiting.          BP 135/52  Pulse 75  Temp(Src) 97.5 F (36.4 C) (Oral)  Resp 13  SpO2 100% Physical Exam  Nursing note and vitals reviewed. Constitutional: She is oriented to person, place, and time. She appears well-developed and well-nourished. No distress.  Obese  HENT:  Head: Normocephalic and atraumatic.  Mouth/Throat: Oropharynx is clear and moist.  Eyes: Conjunctivae and EOM are normal. Pupils are equal, round, and reactive to light.  Neck: Normal range of motion. Neck supple. No JVD present.  Cardiovascular: Normal rate, regular rhythm, normal heart sounds and intact distal pulses.   No extremity edema. Equal distal pulses.  Pulmonary/Chest: Effort normal and breath sounds normal. She exhibits tenderness.  Generalized tenderness across chest.  Abdominal: Soft. Bowel sounds are normal. There is no tenderness.  Musculoskeletal: Normal range of motion. She exhibits no edema.  TTP anterior aspect of right knee. No swelling or deformity. No bruising or signs of trauma. Full ROM without pain.  Neurological: She is alert and oriented to person, place, and time. She has normal strength. No sensory deficit.  Moves limbs without ataxia. Speech fluent, goal oriented. Equal grip strength.  Skin: Skin is warm and dry. She is not diaphoretic.  Psychiatric: She has a normal mood and affect. Her behavior is normal.    ED Course  Procedures (including critical care time) Labs Review Labs Reviewed  BASIC METABOLIC PANEL -  Abnormal; Notable for the following:    Potassium 3.6 (*)    Glucose, Bld 109 (*)    GFR calc non Af Amer 74 (*)    GFR calc Af Amer 85 (*)    All other components within normal limits  CBC  PRO B NATRIURETIC PEPTIDE  I-STAT TROPOININ, ED   Imaging Review Dg Chest 2 View  02/03/2014  CLINICAL DATA:  Chest pain, shortness of breath, fall  EXAM: CHEST  2 VIEW  COMPARISON:  01/21/2014  FINDINGS: Lungs are clear. No pleural effusion or pneumothorax.  The heart is normal in size.  Visualized osseous structures are within normal limits.  IMPRESSION: No evidence of acute cardiopulmonary disease.   Electronically Signed   By: Julian Hy M.D.   On: 02/03/2014 17:57   Dg Knee Complete 4 Views Right  02/03/2014   CLINICAL DATA:  Right knee pain after fall.  EXAM: RIGHT KNEE - COMPLETE 4+ VIEW  COMPARISON:  None.  FINDINGS: No fracture or dislocation is noted. No joint effusion is noted. Mild narrowing of medial joint space is noted. No soft tissue abnormality is noted.  IMPRESSION: Mild degenerative joint disease is noted medially. No acute abnormality seen in the right knee.   Electronically Signed   By: Sabino Dick M.D.   On: 02/03/2014 18:01    EKG Interpretation    Date/Time:  Thursday February 03 2014 16:53:52 EST Ventricular Rate:  76 PR Interval:  150 QRS Duration: 96 QT Interval:  410 QTC Calculation: 461 R Axis:   52 Text Interpretation:  Sinus rhythm Borderline T wave abnormalities Baseline wander in lead(s) V1 Similar to prior Confirmed by Mingo Amber  MD, [place] ([id number]) on 02/03/2014 5:33:12 PM            MDM   Final diagnoses:  Chest pain  Contusion of right knee    Patient presenting with chest pain and shortness of breath. She is well appearing and in no apparent distress. Stable vital signs, O2 sat 95% on RA. Saw cardiologist less than a month ago without any concern. Had a normal echocardiogram in August 2014. Also had a normal cardiac cath October 2013. Chest pain  reproducible. Labs pending. EKG without any changes. Chest x-ray pending. Knee x-ray pending. Patient is currently pain-free and not having an episode of chest pain. 7:12 PM Labs without acute finding. Chest x-ray clear. Knee x-ray showing mild degenerative changes, no acute abnormality. A she reports chest pain has not returned. Doubt cardiac in origin. Low risk heart score of 3. Low suspicion for pulmonary embolism. Patient is stable for discharge, followup with PCP and cardiologist. Return precautions given. Patient states understanding of treatment care plan and is agreeable.   Illene Labrador, PA-C 02/03/14 1914

## 2014-02-03 NOTE — ED Provider Notes (Signed)
Medical screening examination/treatment/procedure(s) were performed by non-physician practitioner and as supervising physician I was immediately available for consultation/collaboration.  EKG Interpretation    Date/Time:  Thursday February 03 2014 16:53:52 EST Ventricular Rate:  76 PR Interval:  150 QRS Duration: 96 QT Interval:  410 QTC Calculation: 461 R Axis:   52 Text Interpretation:  Sinus rhythm Borderline T wave abnormalities Baseline wander in lead(s) V1 Similar to prior Confirmed by Mingo Amber  MD, [place] ([id number]) on 02/03/2014 5:33:12 PM              Osvaldo Shipper, MD 02/03/14 2306

## 2014-02-08 ENCOUNTER — Encounter (HOSPITAL_COMMUNITY): Payer: Self-pay | Admitting: Emergency Medicine

## 2014-02-08 ENCOUNTER — Emergency Department (HOSPITAL_COMMUNITY)
Admission: EM | Admit: 2014-02-08 | Discharge: 2014-02-08 | Disposition: A | Discharge status: Home / Self Care | Payer: Medicaid Other | Attending: Emergency Medicine | Admitting: Emergency Medicine

## 2014-02-08 ENCOUNTER — Emergency Department (HOSPITAL_COMMUNITY): Payer: Medicaid Other

## 2014-02-08 DIAGNOSIS — I5032 Chronic diastolic (congestive) heart failure: Secondary | ICD-10-CM | POA: Insufficient documentation

## 2014-02-08 DIAGNOSIS — Z9889 Other specified postprocedural states: Secondary | ICD-10-CM | POA: Insufficient documentation

## 2014-02-08 DIAGNOSIS — S20219A Contusion of unspecified front wall of thorax, initial encounter: Secondary | ICD-10-CM | POA: Insufficient documentation

## 2014-02-08 DIAGNOSIS — Y9289 Other specified places as the place of occurrence of the external cause: Secondary | ICD-10-CM | POA: Insufficient documentation

## 2014-02-08 DIAGNOSIS — I10 Essential (primary) hypertension: Secondary | ICD-10-CM | POA: Insufficient documentation

## 2014-02-08 DIAGNOSIS — Z79899 Other long term (current) drug therapy: Secondary | ICD-10-CM | POA: Insufficient documentation

## 2014-02-08 DIAGNOSIS — Z8719 Personal history of other diseases of the digestive system: Secondary | ICD-10-CM | POA: Insufficient documentation

## 2014-02-08 DIAGNOSIS — J4489 Other specified chronic obstructive pulmonary disease: Secondary | ICD-10-CM | POA: Insufficient documentation

## 2014-02-08 DIAGNOSIS — E669 Obesity, unspecified: Secondary | ICD-10-CM | POA: Insufficient documentation

## 2014-02-08 DIAGNOSIS — Y93E1 Activity, personal bathing and showering: Secondary | ICD-10-CM | POA: Insufficient documentation

## 2014-02-08 DIAGNOSIS — E039 Hypothyroidism, unspecified: Secondary | ICD-10-CM | POA: Insufficient documentation

## 2014-02-08 DIAGNOSIS — K219 Gastro-esophageal reflux disease without esophagitis: Secondary | ICD-10-CM | POA: Insufficient documentation

## 2014-02-08 DIAGNOSIS — Z880 Allergy status to penicillin: Secondary | ICD-10-CM | POA: Insufficient documentation

## 2014-02-08 DIAGNOSIS — I252 Old myocardial infarction: Secondary | ICD-10-CM | POA: Insufficient documentation

## 2014-02-08 DIAGNOSIS — W19XXXA Unspecified fall, initial encounter: Secondary | ICD-10-CM

## 2014-02-08 DIAGNOSIS — W010XXA Fall on same level from slipping, tripping and stumbling without subsequent striking against object, initial encounter: Secondary | ICD-10-CM | POA: Insufficient documentation

## 2014-02-08 DIAGNOSIS — IMO0002 Reserved for concepts with insufficient information to code with codable children: Secondary | ICD-10-CM | POA: Insufficient documentation

## 2014-02-08 DIAGNOSIS — Z87891 Personal history of nicotine dependence: Secondary | ICD-10-CM | POA: Insufficient documentation

## 2014-02-08 DIAGNOSIS — J449 Chronic obstructive pulmonary disease, unspecified: Secondary | ICD-10-CM | POA: Insufficient documentation

## 2014-02-08 DIAGNOSIS — Z8659 Personal history of other mental and behavioral disorders: Secondary | ICD-10-CM | POA: Insufficient documentation

## 2014-02-08 DIAGNOSIS — M129 Arthropathy, unspecified: Secondary | ICD-10-CM | POA: Insufficient documentation

## 2014-02-08 DIAGNOSIS — Z7982 Long term (current) use of aspirin: Secondary | ICD-10-CM | POA: Insufficient documentation

## 2014-02-08 DIAGNOSIS — G894 Chronic pain syndrome: Secondary | ICD-10-CM | POA: Insufficient documentation

## 2014-02-08 NOTE — ED Provider Notes (Signed)
CSN: [id number]     Arrival date & time 02/08/14  1541 History   First MD Initiated Contact with Patient 02/08/14 1913     Chief Complaint  Patient presents with  . Fall     (Consider location/radiation/quality/duration/timing/severity/associated sxs/prior Treatment) Patient is a 58 y.o. female presenting with fall.  Fall   Pt well known to the ED with multiple chronic pain syndromes presents with complaint of fall 3-4 days ago. States she slipped getting out of the tub landing on her buttocks and hitting her back on the side of the tub. Complaining of mid back pain, radiating around to R lower ribs. No other injuries.   Past Medical History  Diagnosis Date  . Chest pain     a. s/p reported MI in 1997;  b. 09/2012 Cath: nl cors, nl LV ([place]);  c. 05/2012 Non-ischemic myoview.  . Hypertension   . Arthritis   . Problem with literacy   . Shortness of breath   . Anxiety   . Bronchitis   . Chronic headaches   . Obesity   . COPD (chronic obstructive pulmonary disease)   . Chronic diastolic CHF (congestive heart failure)     a. 12/2011 Echo: nl ef, Gr 1 DD;  b. 11/2012 Echo: EF 45-50%, no significant valvular abnormalities.  Marland Kitchen GERD (gastroesophageal reflux disease)   . Myocardial infarction 1997    self reported  . Hypothyroidism   . Asthma   . Chronic chest pain    Past Surgical History  Procedure Laterality Date  . Ectopic pregnancy surgery      right  . Nm myoview ltd  June 2013    no reversible ischemia  . Cardiac catheterization  October 2012    No coronary artery disease  . Cardiac catheterization  2012    normal coronary arteries  . Nm myoview ltd  2012, 2013    normal  . 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 Knee arthroscopy  08/13/13    right   Family History  Problem Relation Age of Onset  . Cancer Mother 67    lung ca  . Diabetes Mother   . Anxiety disorder Sister   . Cancer Brother 60    lung  . Colon cancer Neg Hx    . Pancreatic cancer Neg Hx   . Rectal cancer Neg Hx   . Stomach cancer Neg Hx    History  Substance Use Topics  . Smoking status: Former Smoker -- 1.00 packs/day for 3 years    Quit date: 12/03/2010  . Smokeless tobacco: Never Used  . Alcohol Use: No   OB History   Grav Para Term Preterm Abortions TAB SAB Ect Mult Living                 Review of Systems All other systems reviewed and are negative except as noted in HPI.     Allergies  Darvocet; Ibuprofen; Tramadol; Tylenol; Celebrex; Naproxen; and Penicillins  Home Medications   Current Outpatient Rx  Name  Route  Sig  Dispense  Refill  . aspirin EC 81 MG tablet   Oral   Take 81 mg by mouth daily.         Marland Kitchen atorvastatin (LIPITOR) 40 MG tablet   Oral   Take 40 mg by mouth daily.         . beclomethasone (QVAR) 80 MCG/ACT inhaler   Inhalation  Inhale 2 puffs into the lungs 2 (two) times daily as needed (shortness of breath/ wheezing).          Marland Kitchen levothyroxine (SYNTHROID, LEVOTHROID) 75 MCG tablet   Oral   Take 75 mcg by mouth daily before breakfast.         . metoprolol succinate (TOPROL-XL) 25 MG 24 hr tablet   Oral   Take 25 mg by mouth daily.         . promethazine (PHENERGAN) 25 MG tablet   Oral   Take 25 mg by mouth 2 (two) times daily as needed for nausea or vomiting.           BP 139/87  Pulse 87  Temp(Src) 97.4 F (36.3 C) (Oral)  Resp 14  SpO2 96% Physical Exam  Nursing note and vitals reviewed. Constitutional: She is oriented to person, place, and time. She appears well-developed and well-nourished.  HENT:  Head: Normocephalic and atraumatic.  Eyes: EOM are normal. Pupils are equal, round, and reactive to light.  Neck: Normal range of motion. Neck supple.  Cardiovascular: Normal rate, normal heart sounds and intact distal pulses.   Pulmonary/Chest: Effort normal and breath sounds normal. She has no wheezes. She has no rales. She exhibits tenderness (mild tenderness in R lower  ribs laterally).  Abdominal: Bowel sounds are normal. She exhibits no distension. There is no tenderness.  Musculoskeletal: Normal range of motion. She exhibits no edema and no tenderness.  No focal midline bony spine tenderness, no external signs of injury  Neurological: She is alert and oriented to person, place, and time. She has normal strength. No cranial nerve deficit or sensory deficit.  Skin: Skin is warm and dry. No rash noted.  Psychiatric: She has a normal mood and affect.    ED Course  Procedures (including critical care time) Labs Review Labs Reviewed - No data to display Imaging Review No results found.  EKG Interpretation   None       MDM   Final diagnoses:  None   Pt advised no narcotics unless she has objective evidence of acute injury such as rib fracture on xray. Offered APAP/Motrin which she refused. Care signed out at the change of shift pending imaging.     Seila Liston B. Karle Starch, MD 02/08/14 2012

## 2014-02-08 NOTE — ED Notes (Signed)
Pt. States she fell getting out of the shower and hit mid back on tub. Reports pain radiating along spine that is now radiating around right rib cage. No bruising noted. Moves all extremities well, denies numbness and tingling in extremities.

## 2014-02-08 NOTE — ED Notes (Signed)
Pt states slipped getting out of tub and hit mid back and pain radiates around to ribs.  Pt states intermittent tingling in feet sometimes.  No LOC.

## 2014-02-08 NOTE — ED Provider Notes (Signed)
X-ray reviewed.  No rib fracture noted.  Patient has been discharged to followup with her primary care physician.  No narcotics have been prescribed, to her Dr. Karle Starch.  Patient has a history of chronic pain issues and opioid abuse  Garald Balding, NP 02/08/14 2100

## 2014-02-08 NOTE — ED Notes (Signed)
Back from xray and amb to the BR without difficulty

## 2014-02-08 NOTE — Discharge Instructions (Signed)
Your xray is normal Please follow up with your PCP

## 2014-02-09 NOTE — ED Provider Notes (Signed)
Medical screening examination/treatment/procedure(s) were conducted as a shared visit with non-physician practitioner(s) and myself.  I personally evaluated the patient during the encounter.  EKG Interpretation   None         Charles B. Karle Starch, MD 02/09/14 1011

## 2014-02-10 ENCOUNTER — Emergency Department (HOSPITAL_COMMUNITY)
Admission: EM | Admit: 2014-02-10 | Discharge: 2014-02-10 | Disposition: A | Discharge status: Home / Self Care | Payer: Medicaid Other | Attending: Emergency Medicine | Admitting: Emergency Medicine

## 2014-02-10 ENCOUNTER — Ambulatory Visit
Admission: RE | Admit: 2014-02-10 | Discharge: 2014-02-10 | Disposition: A | Discharge status: Home / Self Care | Payer: Medicaid Other | Source: Ambulatory Visit | Attending: Family Medicine | Admitting: Family Medicine

## 2014-02-10 ENCOUNTER — Emergency Department (HOSPITAL_COMMUNITY): Payer: Medicaid Other

## 2014-02-10 ENCOUNTER — Encounter (HOSPITAL_COMMUNITY): Payer: Self-pay | Admitting: Emergency Medicine

## 2014-02-10 ENCOUNTER — Other Ambulatory Visit: Payer: Self-pay | Admitting: Family Medicine

## 2014-02-10 DIAGNOSIS — Z8659 Personal history of other mental and behavioral disorders: Secondary | ICD-10-CM | POA: Insufficient documentation

## 2014-02-10 DIAGNOSIS — Z880 Allergy status to penicillin: Secondary | ICD-10-CM | POA: Insufficient documentation

## 2014-02-10 DIAGNOSIS — I10 Essential (primary) hypertension: Secondary | ICD-10-CM | POA: Insufficient documentation

## 2014-02-10 DIAGNOSIS — R296 Repeated falls: Secondary | ICD-10-CM | POA: Insufficient documentation

## 2014-02-10 DIAGNOSIS — M545 Low back pain, unspecified: Secondary | ICD-10-CM

## 2014-02-10 DIAGNOSIS — R922 Inconclusive mammogram: Secondary | ICD-10-CM

## 2014-02-10 DIAGNOSIS — E039 Hypothyroidism, unspecified: Secondary | ICD-10-CM | POA: Insufficient documentation

## 2014-02-10 DIAGNOSIS — G8929 Other chronic pain: Secondary | ICD-10-CM | POA: Insufficient documentation

## 2014-02-10 DIAGNOSIS — R0602 Shortness of breath: Secondary | ICD-10-CM | POA: Insufficient documentation

## 2014-02-10 DIAGNOSIS — Z87891 Personal history of nicotine dependence: Secondary | ICD-10-CM | POA: Insufficient documentation

## 2014-02-10 DIAGNOSIS — W19XXXA Unspecified fall, initial encounter: Secondary | ICD-10-CM

## 2014-02-10 DIAGNOSIS — E669 Obesity, unspecified: Secondary | ICD-10-CM | POA: Insufficient documentation

## 2014-02-10 DIAGNOSIS — J4489 Other specified chronic obstructive pulmonary disease: Secondary | ICD-10-CM | POA: Insufficient documentation

## 2014-02-10 DIAGNOSIS — S298XXA Other specified injuries of thorax, initial encounter: Secondary | ICD-10-CM | POA: Insufficient documentation

## 2014-02-10 DIAGNOSIS — Y939 Activity, unspecified: Secondary | ICD-10-CM | POA: Insufficient documentation

## 2014-02-10 DIAGNOSIS — I252 Old myocardial infarction: Secondary | ICD-10-CM | POA: Insufficient documentation

## 2014-02-10 DIAGNOSIS — Z8719 Personal history of other diseases of the digestive system: Secondary | ICD-10-CM | POA: Insufficient documentation

## 2014-02-10 DIAGNOSIS — S46909A Unspecified injury of unspecified muscle, fascia and tendon at shoulder and upper arm level, unspecified arm, initial encounter: Secondary | ICD-10-CM | POA: Insufficient documentation

## 2014-02-10 DIAGNOSIS — Z79899 Other long term (current) drug therapy: Secondary | ICD-10-CM | POA: Insufficient documentation

## 2014-02-10 DIAGNOSIS — Z7982 Long term (current) use of aspirin: Secondary | ICD-10-CM | POA: Insufficient documentation

## 2014-02-10 DIAGNOSIS — M129 Arthropathy, unspecified: Secondary | ICD-10-CM | POA: Insufficient documentation

## 2014-02-10 DIAGNOSIS — S4980XA Other specified injuries of shoulder and upper arm, unspecified arm, initial encounter: Secondary | ICD-10-CM | POA: Insufficient documentation

## 2014-02-10 DIAGNOSIS — IMO0002 Reserved for concepts with insufficient information to code with codable children: Secondary | ICD-10-CM | POA: Insufficient documentation

## 2014-02-10 DIAGNOSIS — Y929 Unspecified place or not applicable: Secondary | ICD-10-CM | POA: Insufficient documentation

## 2014-02-10 DIAGNOSIS — I5032 Chronic diastolic (congestive) heart failure: Secondary | ICD-10-CM | POA: Insufficient documentation

## 2014-02-10 DIAGNOSIS — J449 Chronic obstructive pulmonary disease, unspecified: Secondary | ICD-10-CM | POA: Insufficient documentation

## 2014-02-10 MED ORDER — BUTALBITAL-ASPIRIN-CAFFEINE 50-325-40 MG PO CAPS: 1.0000 | ORAL_CAPSULE | ORAL | Status: DC | PRN

## 2014-02-10 MED ORDER — OXYCODONE HCL 5 MG PO TABS
5.0000 mg | ORAL_TABLET | Freq: Once | ORAL | Status: AC
  Administered 2014-02-10: 5 mg via ORAL
  Filled 2014-02-10: qty 1

## 2014-02-10 MED ORDER — HYDROCODONE-HOMATROPINE 5-1.5 MG/5ML PO SYRP: 5.0000 mL | ORAL_SOLUTION | ORAL | Status: DC | PRN

## 2014-02-10 NOTE — ED Provider Notes (Signed)
CSN: [id number]     Arrival date & time 02/10/14  1538 History   First MD Initiated Contact with Patient 02/10/14 1609     Chief Complaint  Patient presents with  . Shortness of Breath  . Fall  . Back Pain  . Shoulder Pain     (Consider location/radiation/quality/duration/timing/severity/associated sxs/prior Treatment) HPI Patient presents to the emergency department with lower back pain and rib pain from a fall.  The patient was seen in the emergency department yesterday for similar complaints.  Patient, states she fell again this morning, landing on her buttocks.  Patient has chronic low back pain and has multiple ER visits for the same.  Patient denies chest pain, shortness of breath, nausea, vomiting, diarrhea, weakness, dizziness, numbness, or syncope.  Patient, states, that movement and palpation make her pain, worse Past Medical History  Diagnosis Date  . Chest pain     a. s/p reported MI in 1997;  b. 09/2012 Cath: nl cors, nl LV ([place]);  c. 05/2012 Non-ischemic myoview.  . Hypertension   . Arthritis   . Problem with literacy   . Shortness of breath   . Anxiety   . Bronchitis   . Chronic headaches   . Obesity   . COPD (chronic obstructive pulmonary disease)   . Chronic diastolic CHF (congestive heart failure)     a. 12/2011 Echo: nl ef, Gr 1 DD;  b. 11/2012 Echo: EF 45-50%, no significant valvular abnormalities.  Marland Kitchen GERD (gastroesophageal reflux disease)   . Myocardial infarction 1997    self reported  . Hypothyroidism   . Asthma   . Chronic chest pain    Past Surgical History  Procedure Laterality Date  . Ectopic pregnancy surgery      right  . Nm myoview ltd  June 2013    no reversible ischemia  . Cardiac catheterization  October 2012    No coronary artery disease  . Cardiac catheterization  2012    normal coronary arteries  . Nm myoview ltd  2012, 2013    normal  . 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 Knee arthroscopy   08/13/13    right   Family History  Problem Relation Age of Onset  . Cancer Mother 5    lung ca  . Diabetes Mother   . Anxiety disorder Sister   . Cancer Brother 60    lung  . Colon cancer Neg Hx   . Pancreatic cancer Neg Hx   . Rectal cancer Neg Hx   . Stomach cancer Neg Hx    History  Substance Use Topics  . Smoking status: Former Smoker -- 1.00 packs/day for 3 years    Quit date: 12/03/2010  . Smokeless tobacco: Never Used  . Alcohol Use: No   OB History   Grav Para Term Preterm Abortions TAB SAB Ect Mult Living                 Review of Systems  All other systems negative except as documented in the HPI. All pertinent positives and negatives as reviewed in the HPI.  Allergies  Darvocet; Ibuprofen; Tramadol; Tylenol; Celebrex; Naproxen; and Penicillins  Home Medications   Current Outpatient Rx  Name  Route  Sig  Dispense  Refill  . aspirin EC 81 MG tablet   Oral   Take 81 mg by mouth daily.         Marland Kitchen  atorvastatin (LIPITOR) 40 MG tablet   Oral   Take 40 mg by mouth daily.         . beclomethasone (QVAR) 80 MCG/ACT inhaler   Inhalation   Inhale 2 puffs into the lungs 2 (two) times daily as needed (shortness of breath/ wheezing).          Marland Kitchen levothyroxine (SYNTHROID, LEVOTHROID) 75 MCG tablet   Oral   Take 75 mcg by mouth daily before breakfast.         . metoprolol succinate (TOPROL-XL) 25 MG 24 hr tablet   Oral   Take 25 mg by mouth daily.         . promethazine (PHENERGAN) 25 MG tablet   Oral   Take 25 mg by mouth 2 (two) times daily as needed for nausea or vomiting.           BP 110/70  Pulse 77  Temp(Src) 97.7 F (36.5 C) (Oral)  Resp 16  SpO2 98% Physical Exam  Nursing note and vitals reviewed. Constitutional: She is oriented to person, place, and time. She appears well-developed and well-nourished. No distress.  HENT:  Head: Normocephalic and atraumatic.  Mouth/Throat: Oropharynx is clear and moist.  Eyes: Pupils are equal,  round, and reactive to light.  Neck: Normal range of motion. Neck supple.  Cardiovascular: Normal rate, regular rhythm and normal heart sounds.   Pulmonary/Chest: Effort normal and breath sounds normal.  Neurological: She is alert and oriented to person, place, and time. She exhibits normal muscle tone. Coordination normal.  Skin: Skin is warm and dry. No rash noted. No erythema.    ED Course  Procedures (including critical care time) Labs Review Labs Reviewed - No data to display Imaging Review Dg Ribs Unilateral W/chest Right  02/08/2014   CLINICAL DATA:  Anterior right lower rib pain following a fall 2 days ago.  EXAM: RIGHT RIBS AND CHEST - 3+ VIEW  COMPARISON:  Chest 219 1,000.  FINDINGS: Normal sized heart. Clear lungs. No fracture or pneumothorax seen. Lower thoracic spine degenerative changes.  IMPRESSION: No acute abnormality.   Electronically Signed   By: Enrique Sack M.D.   On: 02/08/2014 20:52   Dg Lumbar Spine Complete  02/10/2014   CLINICAL DATA:  Fall.  EXAM: LUMBAR SPINE - COMPLETE 4+ VIEW  COMPARISON:  DG LUMBAR SPINE COMPLETE dated 01/13/2014  FINDINGS: Paraspinal soft tissues normal. No acute bony abnormality identified. There is no evidence of fracture. Good anatomic alignment. Mild thoracolumbar degenerative change. Exam is stable from prior study.  IMPRESSION: No acute abnormality.  No evidence of fracture.   Electronically Signed   By: Marcello Moores  Register   On: 02/10/2014 16:55   Mm Digital Diagnostic Bilat  02/10/2014   CLINICAL DATA:  Annual examination and follow-up on asymmetric density in the right breast. The asymmetry was evaluated in August 2012. It is recommended that the patient return in 6 months. However, the patient did not followup.  EXAM: DIGITAL DIAGNOSTIC  BILATERAL MAMMOGRAM WITH CAD  ULTRASOUND RIGHT BREAST  COMPARISON:  08/09/2011 and 07/26/2011  ACR Breast Density Category b: There are scattered areas of fibroglandular density.  FINDINGS: There is no change  in the previously described asymmetric density in the upper-outer right breast. A 4 mm asymmetry is identified in the inner posterior right breast on the CC view only. On the medially rolled CC view, the location of this asymmetry is not significantly changed, indicating that it is in the central inner right breast.  In the left breast, a new 3 mm group of coarse heterogeneous calcifications are identified at approximately 12 o'clock. No associated mass or architectural distortion. Anterior to this group of calcifications is a second group of calcifications spanning up to 3 mm. This second group demonstrates layering on the lateral view, indicating that they represent benign milk of calcium.  Mammographic images were processed with CAD.  On physical exam, no palpable abnormality is identified in the area of mammographic concern in the inner right breast.  Targeted ultrasound of the central inner right breast is negative.  IMPRESSION: 1. Indeterminate left breast calcifications. 2. Probably benign right breast asymmetry.  RECOMMENDATION: Stereotactic left breast biopsy is recommended. This has been scheduled for 02/17/2014. Recommendation for right breast asymmetry will be made following biopsy results.  I have discussed the findings and recommendations with the patient. Results were also provided in writing at the conclusion of the visit. If applicable, a reminder letter will be sent to the patient regarding the next appointment.  BI-RADS CATEGORY  4: Suspicious abnormality - biopsy should be considered.   Electronically Signed   By: Donavan Burnet M.D.   On: 02/10/2014 15:09   US Breast Ltd Uni Right Inc Axilla  02/10/2014   CLINICAL DATA:  Annual examination and follow-up on asymmetric density in the right breast. The asymmetry was evaluated in August 2012. It is recommended that the patient return in 6 months. However, the patient did not followup.  EXAM: DIGITAL DIAGNOSTIC  BILATERAL MAMMOGRAM WITH CAD   ULTRASOUND RIGHT BREAST  COMPARISON:  08/09/2011 and 07/26/2011  ACR Breast Density Category b: There are scattered areas of fibroglandular density.  FINDINGS: There is no change in the previously described asymmetric density in the upper-outer right breast. A 4 mm asymmetry is identified in the inner posterior right breast on the CC view only. On the medially rolled CC view, the location of this asymmetry is not significantly changed, indicating that it is in the central inner right breast. In the left breast, a new 3 mm group of coarse heterogeneous calcifications are identified at approximately 12 o'clock. No associated mass or architectural distortion. Anterior to this group of calcifications is a second group of calcifications spanning up to 3 mm. This second group demonstrates layering on the lateral view, indicating that they represent benign milk of calcium.  Mammographic images were processed with CAD.  On physical exam, no palpable abnormality is identified in the area of mammographic concern in the inner right breast.  Targeted ultrasound of the central inner right breast is negative.  IMPRESSION: 1. Indeterminate left breast calcifications. 2. Probably benign right breast asymmetry.  RECOMMENDATION: Stereotactic left breast biopsy is recommended. This has been scheduled for 02/17/2014. Recommendation for right breast asymmetry will be made following biopsy results.  I have discussed the findings and recommendations with the patient. Results were also provided in writing at the conclusion of the visit. If applicable, a reminder letter will be sent to the patient regarding the next appointment.  BI-RADS CATEGORY  4: Suspicious abnormality - biopsy should be considered.   Electronically Signed   By: Donavan Burnet M.D.   On: 02/10/2014 15:09    EKG Interpretation    Date/Time:  Thursday February 10 2014 16:08:49 EST Ventricular Rate:  74 PR Interval:  149 QRS Duration: 91 QT  Interval:  411 QTC Calculation: 456 R Axis:   58 Text Interpretation:  Sinus rhythm Low voltage, extremity leads similar to previous from last week Confirmed  by Wilson Singer  MD, [place] [phone]) on 02/10/2014 4:23:09 PM            Patient will be treated for the persistent pain from her fall.  She has no neurological deficits noted on exam.  She is advised to return here as needed.  Also advised to follow up with her primary care Dr. for further evaluation.  Told to use ice and heat on her lower back   Brent General, PA-C 02/10/14 1740

## 2014-02-10 NOTE — ED Notes (Signed)
PA student at bedside.

## 2014-02-10 NOTE — ED Notes (Addendum)
Pt c/o SOB x 1 day and lower back and L shoulder pain after a fall this morning.  Pain score 10/10.  Pt reports that she fell x 2 days ago, was seen at [facility], and diagnosed w/ rib contusion.  Sts she fell again this morning.  Pt speaking in full sentences w/o difficulty.  NAD noted.   Hx of SOB and chronic lower back pain.

## 2014-02-10 NOTE — Discharge Instructions (Signed)
Return here as needed. Follow up with your Doctor.

## 2014-02-14 NOTE — ED Provider Notes (Signed)
Medical screening examination/treatment/procedure(s) were performed by non-physician practitioner and as supervising physician I was immediately available for consultation/collaboration.   EKG Interpretation   Date/Time:  Thursday February 10 2014 16:08:49 EST Ventricular Rate:  74 PR Interval:  149 QRS Duration: 91 QT Interval:  411 QTC Calculation: 456 R Axis:   58 Text Interpretation:  Sinus rhythm Low voltage, extremity leads similar to  previous from last week Confirmed by Wilson Singer  MD, [place] Hein[place] ([id number]) on  02/10/2014 4:23:09 PM       Virgel Manifold, MD 02/14/14 1425

## 2014-02-16 ENCOUNTER — Ambulatory Visit: Payer: Medicaid Other | Admitting: Internal Medicine

## 2014-02-17 ENCOUNTER — Ambulatory Visit: Payer: Medicaid Other | Admitting: Family Medicine

## 2014-02-17 ENCOUNTER — Ambulatory Visit
Admission: RE | Admit: 2014-02-17 | Discharge: 2014-02-17 | Disposition: A | Discharge status: Home / Self Care | Payer: Self-pay | Source: Ambulatory Visit | Attending: Family Medicine | Admitting: Family Medicine

## 2014-02-17 DIAGNOSIS — R922 Inconclusive mammogram: Secondary | ICD-10-CM

## 2014-02-21 ENCOUNTER — Encounter (HOSPITAL_COMMUNITY): Payer: Self-pay | Admitting: Emergency Medicine

## 2014-02-21 ENCOUNTER — Emergency Department (HOSPITAL_COMMUNITY): Payer: Medicaid Other

## 2014-02-21 ENCOUNTER — Emergency Department (HOSPITAL_COMMUNITY)
Admission: EM | Admit: 2014-02-21 | Discharge: 2014-02-21 | Disposition: A | Discharge status: Home / Self Care | Payer: Medicaid Other | Attending: Emergency Medicine | Admitting: Emergency Medicine

## 2014-02-21 DIAGNOSIS — Z7982 Long term (current) use of aspirin: Secondary | ICD-10-CM | POA: Insufficient documentation

## 2014-02-21 DIAGNOSIS — E039 Hypothyroidism, unspecified: Secondary | ICD-10-CM | POA: Insufficient documentation

## 2014-02-21 DIAGNOSIS — M545 Low back pain, unspecified: Secondary | ICD-10-CM

## 2014-02-21 DIAGNOSIS — I252 Old myocardial infarction: Secondary | ICD-10-CM | POA: Insufficient documentation

## 2014-02-21 DIAGNOSIS — Z79899 Other long term (current) drug therapy: Secondary | ICD-10-CM | POA: Insufficient documentation

## 2014-02-21 DIAGNOSIS — Z8719 Personal history of other diseases of the digestive system: Secondary | ICD-10-CM | POA: Insufficient documentation

## 2014-02-21 DIAGNOSIS — IMO0002 Reserved for concepts with insufficient information to code with codable children: Secondary | ICD-10-CM | POA: Insufficient documentation

## 2014-02-21 DIAGNOSIS — E669 Obesity, unspecified: Secondary | ICD-10-CM | POA: Insufficient documentation

## 2014-02-21 DIAGNOSIS — M129 Arthropathy, unspecified: Secondary | ICD-10-CM | POA: Insufficient documentation

## 2014-02-21 DIAGNOSIS — Z87891 Personal history of nicotine dependence: Secondary | ICD-10-CM | POA: Insufficient documentation

## 2014-02-21 DIAGNOSIS — J441 Chronic obstructive pulmonary disease with (acute) exacerbation: Secondary | ICD-10-CM | POA: Insufficient documentation

## 2014-02-21 DIAGNOSIS — J45901 Unspecified asthma with (acute) exacerbation: Principal | ICD-10-CM

## 2014-02-21 DIAGNOSIS — I10 Essential (primary) hypertension: Secondary | ICD-10-CM | POA: Insufficient documentation

## 2014-02-21 DIAGNOSIS — I5032 Chronic diastolic (congestive) heart failure: Secondary | ICD-10-CM | POA: Insufficient documentation

## 2014-02-21 DIAGNOSIS — J40 Bronchitis, not specified as acute or chronic: Secondary | ICD-10-CM

## 2014-02-21 DIAGNOSIS — Z880 Allergy status to penicillin: Secondary | ICD-10-CM | POA: Insufficient documentation

## 2014-02-21 DIAGNOSIS — R110 Nausea: Secondary | ICD-10-CM

## 2014-02-21 DIAGNOSIS — G8929 Other chronic pain: Secondary | ICD-10-CM | POA: Insufficient documentation

## 2014-02-21 LAB — CBC
HCT: 39.1 % (ref 36.0–46.0)
Hemoglobin: 13.1 g/dL (ref 12.0–15.0)
MCH: 28.9 pg (ref 26.0–34.0)
MCHC: 33.5 g/dL (ref 30.0–36.0)
MCV: 86.1 fL (ref 78.0–100.0)
Platelets: 276 10*3/uL (ref 150–400)
RBC: 4.54 MIL/uL (ref 3.87–5.11)
RDW: 14.6 % (ref 11.5–15.5)
WBC: 8.1 10*3/uL (ref 4.0–10.5)

## 2014-02-21 LAB — I-STAT TROPONIN, ED: Troponin i, poc: 0 ng/mL (ref 0.00–0.08)

## 2014-02-21 LAB — BASIC METABOLIC PANEL
BUN: 9 mg/dL (ref 6–23)
CO2: 25 mEq/L (ref 19–32)
Calcium: 9.3 mg/dL (ref 8.4–10.5)
Chloride: 104 mEq/L (ref 96–112)
Creatinine, Ser: 0.74 mg/dL (ref 0.50–1.10)
GFR calc Af Amer: >90 mL/min (ref >90)
GFR calc non Af Amer: >90 mL/min (ref >90)

## 2014-02-21 LAB — BASIC METABOLIC PANEL WITH GFR
Glucose, Bld: 81 mg/dL (ref 70–99)
Potassium: 4.2 meq/L (ref 3.7–5.3)
Sodium: 142 meq/L (ref 137–147)

## 2014-02-21 MED ORDER — PREDNISONE 20 MG PO TABS: 40.0000 mg | ORAL_TABLET | Freq: Every day | ORAL | Status: DC

## 2014-02-21 MED ORDER — HYDROCOD POLST-CHLORPHEN POLST 10-8 MG/5ML PO LQCR
5.0000 mL | Freq: Once | ORAL | Status: AC
  Administered 2014-02-21: 5 mL via ORAL
  Filled 2014-02-21: qty 5

## 2014-02-21 MED ORDER — ALBUTEROL SULFATE (2.5 MG/3ML) 0.083% IN NEBU
5.0000 mg | INHALATION_SOLUTION | Freq: Once | RESPIRATORY_TRACT | Status: AC
  Administered 2014-02-21: 5 mg via RESPIRATORY_TRACT
  Filled 2014-02-21: qty 6

## 2014-02-21 MED ORDER — ONDANSETRON 4 MG PO TBDP
8.0000 mg | ORAL_TABLET | Freq: Once | ORAL | Status: AC
  Administered 2014-02-21: 8 mg via ORAL
  Filled 2014-02-21: qty 2

## 2014-02-21 MED ORDER — GUAIFENESIN-CODEINE 300-10 MG/5ML PO LIQD: 10.0000 mL | Freq: Four times a day (QID) | ORAL | Status: DC | PRN

## 2014-02-21 MED ORDER — PROMETHAZINE HCL 25 MG PO TABS: 25.0000 mg | ORAL_TABLET | Freq: Four times a day (QID) | ORAL | Status: DC | PRN

## 2014-02-21 NOTE — ED Notes (Addendum)
Pt does not smoke; quit 7 years ago. Reports pain in ribs and chest with deep breathes starting yesterday; epigastric pain currently; afebrile; nausea and vomiting x 4. Denies hx of pancreatitis. Denies alcohol use.

## 2014-02-21 NOTE — ED Notes (Signed)
PT ambulated with baseline gait; VSS; A&Ox3; no signs of distress; respirations even and unlabored; skin warm and dry; no questions upon discharge.  

## 2014-02-21 NOTE — Discharge Instructions (Signed)
Bronchitis °Bronchitis is inflammation of the airways that extend from the windpipe into the lungs (bronchi). The inflammation often causes mucus to develop, which leads to a cough. If the inflammation becomes severe, it may cause shortness of breath. °CAUSES  °Bronchitis may be caused by:  °· Viral infections.   °· Bacteria.   °· Cigarette smoke.   °· Allergens, pollutants, and other irritants.   °SIGNS AND SYMPTOMS  °The most common symptom of bronchitis is a frequent cough that produces mucus. Other symptoms include: °· Fever.   °· Body aches.   °· Chest congestion.   °· Chills.   °· Shortness of breath.   °· Sore throat.   °DIAGNOSIS  °Bronchitis is usually diagnosed through a medical history and physical exam. Tests, such as chest X-rays, are sometimes done to rule out other conditions.  °TREATMENT  °You may need to avoid contact with whatever caused the problem (smoking, for example). Medicines are sometimes needed. These may include: °· Antibiotics. These may be prescribed if the condition is caused by bacteria. °· Cough suppressants. These may be prescribed for relief of cough symptoms.   °· Inhaled medicines. These may be prescribed to help open your airways and make it easier for you to breathe.   °· Steroid medicines. These may be prescribed for those with recurrent (chronic) bronchitis. °HOME CARE INSTRUCTIONS °· Get plenty of rest.   °· Drink enough fluids to keep your urine clear or pale yellow (unless you have a medical condition that requires fluid restriction). Increasing fluids may help thin your secretions and will prevent dehydration.   °· Only take over-the-counter or prescription medicines as directed by your health care provider. °· Only take antibiotics as directed. Make sure you finish them even if you start to feel better. °· Avoid secondhand smoke, irritating chemicals, and strong fumes. These will make bronchitis worse. If you are a smoker, quit smoking. Consider using nicotine gum or  skin patches to help control withdrawal symptoms. Quitting smoking will help your lungs heal faster.   °· Put a cool-mist humidifier in your bedroom at night to moisten the air. This may help loosen mucus. Change the water in the humidifier daily. You can also run the hot water in your shower and sit in the bathroom with the door closed for 5 10 minutes.   °· Follow up with your health care provider as directed.   °· Wash your hands frequently to avoid catching bronchitis again or spreading an infection to others.   °SEEK MEDICAL CARE IF: °Your symptoms do not improve after 1 week of treatment.  °SEEK IMMEDIATE MEDICAL CARE IF: °· Your fever increases. °· You have chills.   °· You have chest pain.   °· You have worsening shortness of breath.   °· You have bloody sputum. °· You faint.   °· You have lightheadedness. °· You have a severe headache.   °· You vomit repeatedly. °MAKE SURE YOU:  °· Understand these instructions. °· Will watch your condition. °· Will get help right away if you are not doing well or get worse. °Document Released: 12/02/2005 Document Revised: 09/22/2013 Document Reviewed: 07/27/2013 °ExitCare® Patient Information ©2014 [employer]. ° ° °Narcotic and benzodiazepine use may cause drowsiness, slowed breathing or dependence.  Please use with caution and do not drive, operate machinery or watch young children alone while taking them.  Taking combinations of these medications or drinking alcohol will potentiate these effects.   ° °

## 2014-02-21 NOTE — ED Provider Notes (Signed)
CSN: [id number]     Arrival date & time 02/21/14  1805 History   First MD Initiated Contact with Patient 02/21/14 2035     Chief Complaint  Patient presents with  . Cough  . Back Pain     (Consider location/radiation/quality/duration/timing/severity/associated sxs/prior Treatment) HPI Comments: Patient reports approximately 2 days of dry, paroxysmal cough with associated chest wall tenderness and soreness as well as low back pain, nausea and vomiting. Patient reports eating and drinking makes the nausea worse. She denies any abdominal pain. Patient reports chest is not sore except when she is coughing. Patient has a history of COPD and chronic bronchitis as well as a history of diastolic CHF. In review of prior records, patient has been seen numerous times for low back pain as well as for bronchitis in the past. Patient does have an inhaler at home which she has been using without significant relief. Patient is not on chronic pain medicine for her back pain nor for her chronic knee pain for which he sees an orthopedist when necessary. She reports Phenergan has been useful for her nausea and vomiting in the past. She denies any unusual sweats, has had some chills but no documented fevers at home. No diaphoresis.   Patient is a 58 y.o. female presenting with cough and back pain. The history is provided by the patient and medical records.  Cough Associated symptoms: chest pain, chills, shortness of breath and wheezing   Associated symptoms: no fever and no rash   Back Pain Associated symptoms: chest pain   Associated symptoms: no abdominal pain and no fever     Past Medical History  Diagnosis Date  . Chest pain     a. s/p reported MI in 1997;  b. 09/2012 Cath: nl cors, nl LV ([place]);  c. 05/2012 Non-ischemic myoview.  . Hypertension   . Arthritis   . Problem with literacy   . Shortness of breath   . Anxiety   . Bronchitis   . Chronic headaches   . Obesity   . COPD (chronic obstructive  pulmonary disease)   . Chronic diastolic CHF (congestive heart failure)     a. 12/2011 Echo: nl ef, Gr 1 DD;  b. 11/2012 Echo: EF 45-50%, no significant valvular abnormalities.  Marland Kitchen GERD (gastroesophageal reflux disease)   . Myocardial infarction 1997    self reported  . Hypothyroidism   . Asthma   . Chronic chest pain    Past Surgical History  Procedure Laterality Date  . Ectopic pregnancy surgery      right  . Nm myoview ltd  June 2013    no reversible ischemia  . Cardiac catheterization  October 2012    No coronary artery disease  . Cardiac catheterization  2012    normal coronary arteries  . Nm myoview ltd  2012, 2013    normal  . 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 Knee arthroscopy  08/13/13    right   Family History  Problem Relation Age of Onset  . Cancer Mother 62    lung ca  . Diabetes Mother   . Anxiety disorder Sister   . Cancer Brother 60    lung  . Colon cancer Neg Hx   . Pancreatic cancer Neg Hx   . Rectal cancer Neg Hx   . Stomach cancer Neg Hx    History  Substance Use Topics  . Smoking status:  Former Smoker -- 1.00 packs/day for 3 years    Quit date: 12/03/2010  . Smokeless tobacco: Never Used  . Alcohol Use: No   OB History   Grav Para Term Preterm Abortions TAB SAB Ect Mult Living                 Review of Systems  Constitutional: Positive for chills. Negative for fever.  HENT: Positive for congestion.   Respiratory: Positive for cough, shortness of breath and wheezing.   Cardiovascular: Positive for chest pain.  Gastrointestinal: Positive for nausea and vomiting. Negative for abdominal pain.  Genitourinary: Negative for flank pain.  Musculoskeletal: Positive for back pain.  Skin: Negative for rash and wound.  All other systems reviewed and are negative.      Allergies  Darvocet; Ibuprofen; Tramadol; Tylenol; Celebrex; Naproxen; and Penicillins  Home Medications   Current Outpatient Rx  Name   Route  Sig  Dispense  Refill  . aspirin EC 81 MG tablet   Oral   Take 81 mg by mouth daily.         Marland Kitchen atorvastatin (LIPITOR) 40 MG tablet   Oral   Take 40 mg by mouth daily.         . beclomethasone (QVAR) 80 MCG/ACT inhaler   Inhalation   Inhale 2 puffs into the lungs 2 (two) times daily as needed (shortness of breath/ wheezing).          . butalbital-aspirin-caffeine (FIORINAL) 50-325-40 MG per capsule   Oral   Take 1 capsule by mouth every 4 (four) hours as needed (Pain).   15 capsule   0   . HYDROcodone-homatropine (HYCODAN) 5-1.5 MG/5ML syrup   Oral   Take 5 mLs by mouth every 4 (four) hours as needed (PRN pain).   120 mL   0   . levothyroxine (SYNTHROID, LEVOTHROID) 75 MCG tablet   Oral   Take 75 mcg by mouth daily before breakfast.         . metoprolol succinate (TOPROL-XL) 25 MG 24 hr tablet   Oral   Take 25 mg by mouth daily.         . promethazine (PHENERGAN) 25 MG tablet   Oral   Take 25 mg by mouth 2 (two) times daily as needed for nausea or vomiting.           BP 119/59  Pulse 67  Temp(Src) 98.1 F (36.7 C) (Oral)  Resp 20  Ht 5\' 8"  (1.727 m)  Wt 210 lb (95.255 kg)  BMI 31.94 kg/m2  SpO2 100% Physical Exam  Nursing note and vitals reviewed. Constitutional: She is oriented to person, place, and time. She appears well-developed and well-nourished.  HENT:  Head: Normocephalic and atraumatic.  Eyes: Conjunctivae and EOM are normal. No scleral icterus.  Neck: Normal range of motion. Neck supple.  Cardiovascular: Normal rate, regular rhythm and intact distal pulses.   Pulmonary/Chest: Effort normal and breath sounds normal. No respiratory distress. She has no wheezes. She has no rales.  Occasional paroxysmal dry cough  Abdominal: Soft. There is no tenderness. There is no rebound and no guarding.  Musculoskeletal: She exhibits no edema.       Lumbar back: She exhibits tenderness, pain and spasm. She exhibits no bony tenderness, no deformity  and normal pulse.  Neurological: She is alert and oriented to person, place, and time. She has normal strength. No sensory deficit. She exhibits normal muscle tone.  Reflex Scores:  Patellar reflexes are 1+ on the right side and 1+ on the left side. Skin: Skin is warm.    ED Course  Procedures (including critical care time) Labs Review Labs Reviewed  BASIC METABOLIC PANEL  [place], ED   Imaging Review Dg Chest 2 View  02/21/2014   CLINICAL DATA:  Cough and back pain.  EXAM: CHEST  2 VIEW  COMPARISON:  Chest x-rays dated 02/08/2014 and 02/03/2014  FINDINGS: Heart size and pulmonary vascularity are normal. There is slight peribronchial thickening. No infiltrates or effusions. No acute osseous abnormality.  IMPRESSION: Slight bronchitic changes.   Electronically Signed   By: Rozetta Nunnery M.D.   On: 02/21/2014 18:46     EKG Interpretation   Date/Time:  Monday February 21 2014 18:10:47 EDT Ventricular Rate:  77 PR Interval:  146 QRS Duration: 80 QT Interval:  394 QTC Calculation: 445 R Axis:   39 Text Interpretation:  Normal sinus rhythm borderline t wave abn's, seen  previously Borderline ECG Confirmed by [facility]  MD, MICHEAL ([id number]) on  02/21/2014 8:37:32 PM      Room air saturation is 100% and I interpret this to be normal.  9:47 PM Pt feels improved, is calling now for a ride home.  Will d.c home with Rx's.    MDM   Final diagnoses:  Bronchitis  Low back pain  Nausea     Patient with multiple symptoms without appearance of toxicity, stable vital signs, with complaints that has been seen numerous times in the past upon review of her prior records. Her chest x-ray is suggestive of bronchitis. Given the patient's coughing and symptoms, I agree the patient likely has a mild case of bronchitis. She also likely has exacerbation of her chronic low back pain. My plan is to give her some Tussionex for her cough as well as her pain, give some reading treatments and  some oral Zofran. If the patient has some symptomatic relief, I feel the patient is stable for discharge and further treatment as an outpatient. She'll receive prescriptions for her cough, pain and nausea for home. She can otherwise followup with her primary care physician later this week. ECG shows no new ischemic changes, troponin is within normal limits here today.    Saddie Benders. Dorna Mai, MD 02/21/14 2148

## 2014-02-21 NOTE — ED Notes (Addendum)
Pt reports last night started having bilateral shoulder pain, trouble breathing, coughing, pain in ribs, and and back pain. Denies any cp, does state she vomited and it was like mucus. States she has been dizzy and almost fell going to the bathroom this morning. Pt states she slipped in the shower landing on her back and states she has been having the back pain since then which was a couple days ago.

## 2014-02-28 ENCOUNTER — Telehealth: Payer: Self-pay | Admitting: Internal Medicine

## 2014-02-28 NOTE — Telephone Encounter (Signed)
PT  CALLED  COMPLAINING  OF  FEELING HOT  THEN COLD,  SOB,  CHEST   AND  RIB  PAIN  HURTS  WITH MOVEMENT   WAS RECENTLY DIAGNOSED   WITH  BRONCHITIS  ENCOURAGED PT  T O  TAKE  TYLENOL  FOR  PAIN   PER   PT DOES NOT HELP   INSTRUCTED PT TO CALL  PMD OR  GO  TO  URGENT CARE    FOR  EVAL AND TX PT VERBALIZED  UNDERSTANDING./CY

## 2014-02-28 NOTE — Telephone Encounter (Signed)
New message        C/o sob and pains from when she fell last week.

## 2014-03-03 ENCOUNTER — Ambulatory Visit: Payer: Medicaid Other | Admitting: Family Medicine

## 2014-03-05 ENCOUNTER — Emergency Department (HOSPITAL_COMMUNITY)
Admission: EM | Admit: 2014-03-05 | Discharge: 2014-03-05 | Disposition: A | Discharge status: Home / Self Care | Payer: Medicaid Other | Attending: Emergency Medicine | Admitting: Emergency Medicine

## 2014-03-05 ENCOUNTER — Encounter (HOSPITAL_COMMUNITY): Payer: Self-pay | Admitting: Emergency Medicine

## 2014-03-05 ENCOUNTER — Emergency Department (HOSPITAL_COMMUNITY): Payer: Medicaid Other

## 2014-03-05 DIAGNOSIS — J449 Chronic obstructive pulmonary disease, unspecified: Secondary | ICD-10-CM | POA: Insufficient documentation

## 2014-03-05 DIAGNOSIS — M25561 Pain in right knee: Secondary | ICD-10-CM

## 2014-03-05 DIAGNOSIS — G8929 Other chronic pain: Secondary | ICD-10-CM | POA: Insufficient documentation

## 2014-03-05 DIAGNOSIS — E669 Obesity, unspecified: Secondary | ICD-10-CM | POA: Insufficient documentation

## 2014-03-05 DIAGNOSIS — S8990XA Unspecified injury of unspecified lower leg, initial encounter: Secondary | ICD-10-CM | POA: Insufficient documentation

## 2014-03-05 DIAGNOSIS — Y93E1 Activity, personal bathing and showering: Secondary | ICD-10-CM | POA: Insufficient documentation

## 2014-03-05 DIAGNOSIS — I10 Essential (primary) hypertension: Secondary | ICD-10-CM | POA: Insufficient documentation

## 2014-03-05 DIAGNOSIS — Z9889 Other specified postprocedural states: Secondary | ICD-10-CM | POA: Insufficient documentation

## 2014-03-05 DIAGNOSIS — Z791 Long term (current) use of non-steroidal anti-inflammatories (NSAID): Secondary | ICD-10-CM | POA: Insufficient documentation

## 2014-03-05 DIAGNOSIS — Z87891 Personal history of nicotine dependence: Secondary | ICD-10-CM | POA: Insufficient documentation

## 2014-03-05 DIAGNOSIS — Z79899 Other long term (current) drug therapy: Secondary | ICD-10-CM | POA: Insufficient documentation

## 2014-03-05 DIAGNOSIS — Z7982 Long term (current) use of aspirin: Secondary | ICD-10-CM | POA: Insufficient documentation

## 2014-03-05 DIAGNOSIS — J4489 Other specified chronic obstructive pulmonary disease: Secondary | ICD-10-CM | POA: Insufficient documentation

## 2014-03-05 DIAGNOSIS — Z8719 Personal history of other diseases of the digestive system: Secondary | ICD-10-CM | POA: Insufficient documentation

## 2014-03-05 DIAGNOSIS — Y929 Unspecified place or not applicable: Secondary | ICD-10-CM | POA: Insufficient documentation

## 2014-03-05 DIAGNOSIS — M129 Arthropathy, unspecified: Secondary | ICD-10-CM | POA: Insufficient documentation

## 2014-03-05 DIAGNOSIS — E039 Hypothyroidism, unspecified: Secondary | ICD-10-CM | POA: Insufficient documentation

## 2014-03-05 DIAGNOSIS — I252 Old myocardial infarction: Secondary | ICD-10-CM | POA: Insufficient documentation

## 2014-03-05 DIAGNOSIS — Z8659 Personal history of other mental and behavioral disorders: Secondary | ICD-10-CM | POA: Insufficient documentation

## 2014-03-05 DIAGNOSIS — R296 Repeated falls: Secondary | ICD-10-CM | POA: Insufficient documentation

## 2014-03-05 DIAGNOSIS — Z880 Allergy status to penicillin: Secondary | ICD-10-CM | POA: Insufficient documentation

## 2014-03-05 DIAGNOSIS — S99929A Unspecified injury of unspecified foot, initial encounter: Principal | ICD-10-CM

## 2014-03-05 DIAGNOSIS — I5032 Chronic diastolic (congestive) heart failure: Secondary | ICD-10-CM | POA: Insufficient documentation

## 2014-03-05 DIAGNOSIS — S99919A Unspecified injury of unspecified ankle, initial encounter: Principal | ICD-10-CM

## 2014-03-05 MED ORDER — DICLOFENAC POTASSIUM 50 MG PO TABS: 50.0000 mg | ORAL_TABLET | Freq: Two times a day (BID) | ORAL | Status: DC

## 2014-03-05 NOTE — ED Notes (Signed)
Per EMS: Pt fell in the shower a week ago and is now c/o rt knee pain.  Pt ambulatory upon EMS arrival.  No swelling or deformity noted.

## 2014-03-05 NOTE — ED Provider Notes (Signed)
CSN: [id number]     Arrival date & time 03/05/14  1820 History  This chart was scribed for non-physician practitioner, Antonietta Breach, PA-C,working with Neta Ehlers, MD, by Marlowe Kays, ED Scribe.  This patient was seen in room WTR5/WTR5 and the patient's care was started at 8:17 PM.  Chief Complaint  Patient presents with  . Knee Pain   The history is provided by the patient. No language interpreter was used.   HPI Comments:  [name] is a 58 y.o. female brought in by EMS, who presents to the Emergency Department complaining of a fall that occurred approximately one week ago. She reports sharp right knee pain that radiates down to her ankle. She states she has taken Tylenol for the pain with no relief. She reports being able to ambulate with pain. She denies any prior trauma or injury to the knee. She denies LOC, bowel or bladder incontinence, or numbness in her groin or anal region.   Past Medical History  Diagnosis Date  . Chest pain     a. s/p reported MI in 1997;  b. 09/2012 Cath: nl cors, nl LV ([place]);  c. 05/2012 Non-ischemic myoview.  . Hypertension   . Arthritis   . Problem with literacy   . Shortness of breath   . Anxiety   . Bronchitis   . Chronic headaches   . Obesity   . COPD (chronic obstructive pulmonary disease)   . Chronic diastolic CHF (congestive heart failure)     a. 12/2011 Echo: nl ef, Gr 1 DD;  b. 11/2012 Echo: EF 45-50%, no significant valvular abnormalities.  Marland Kitchen GERD (gastroesophageal reflux disease)   . Myocardial infarction 1997    self reported  . Hypothyroidism   . Asthma   . Chronic chest pain    Past Surgical History  Procedure Laterality Date  . Ectopic pregnancy surgery      right  . Nm myoview ltd  June 2013    no reversible ischemia  . Cardiac catheterization  October 2012    No coronary artery disease  . Cardiac catheterization  2012    normal coronary arteries  . Nm myoview ltd  2012, 2013    normal  . Ep study and  ablation for vt  02/10/13    RVOT VT ablated by Dr Rayann Heman  . Knee arthroscopy  08/13/13    right   Family History  Problem Relation Age of Onset  . Cancer Mother 106    lung ca  . Diabetes Mother   . Anxiety disorder Sister   . Cancer Brother 60    lung  . Colon cancer Neg Hx   . Pancreatic cancer Neg Hx   . Rectal cancer Neg Hx   . Stomach cancer Neg Hx    History  Substance Use Topics  . Smoking status: Former Smoker -- 1.00 packs/day for 3 years    Quit date: 12/03/2010  . Smokeless tobacco: Never Used  . Alcohol Use: No   OB History   Grav Para Term Preterm Abortions TAB SAB Ect Mult Living                 Review of Systems  Musculoskeletal: Positive for arthralgias (right knee). Negative for joint swelling.  Neurological: Negative for syncope.  All other systems reviewed and are negative.    Allergies  Darvocet; Ibuprofen; Tramadol; Tylenol; Celebrex; Naproxen; and Penicillins  Home Medications   Current Outpatient Rx  Name  Route  Sig  Dispense  Refill  . aspirin EC 81 MG tablet   Oral   Take 81 mg by mouth daily.         Marland Kitchen atorvastatin (LIPITOR) 40 MG tablet   Oral   Take 40 mg by mouth daily.         . beclomethasone (QVAR) 80 MCG/ACT inhaler   Inhalation   Inhale 2 puffs into the lungs 2 (two) times daily as needed (shortness of breath/ wheezing).          . butalbital-aspirin-caffeine (FIORINAL) 50-325-40 MG per capsule   Oral   Take 1 capsule by mouth every 4 (four) hours as needed (Pain).   15 capsule   0   . levothyroxine (SYNTHROID, LEVOTHROID) 75 MCG tablet   Oral   Take 75 mcg by mouth daily before breakfast.         . metoprolol succinate (TOPROL-XL) 25 MG 24 hr tablet   Oral   Take 25 mg by mouth daily.         . promethazine (PHENERGAN) 25 MG tablet   Oral   Take 1 tablet (25 mg total) by mouth every 6 (six) hours as needed for nausea or vomiting.   20 tablet   0   . diclofenac (CATAFLAM) 50 MG tablet   Oral    Take 1 tablet (50 mg total) by mouth 2 (two) times daily.   14 tablet   0   . Guaifenesin-Codeine 300-10 MG/5ML LIQD   Oral   Take 10 mLs by mouth every 6 (six) hours as needed.   300 mL   0    Triage Vitals: BP 114/77  Pulse 63  Temp(Src) 98.3 F (36.8 C) (Oral)  Resp 17  SpO2 100%  Physical Exam  Nursing note and vitals reviewed. Constitutional: She is oriented to person, place, and time. She appears well-developed and well-nourished. No distress.  HENT:  Head: Normocephalic and atraumatic.  Eyes: Conjunctivae and EOM are normal. No scleral icterus.  Neck: Normal range of motion.  Cardiovascular: Normal rate, regular rhythm and intact distal pulses.   Pulses:      Dorsalis pedis pulses are 2+ on the right side.       Posterior tibial pulses are 2+ on the right side.  Pulmonary/Chest: Effort normal. No respiratory distress.  Musculoskeletal: Normal range of motion. She exhibits tenderness.       Right knee: She exhibits normal range of motion, no effusion, no deformity, no erythema, normal alignment, no LCL laxity, no bony tenderness and no MCL laxity. Tenderness found. Medial joint line and lateral joint line tenderness noted.       Right upper leg: Normal.       Right lower leg: Normal.  Neurological: She is alert and oriented to person, place, and time.  No numbness, tingling or weakness of the affected extremity. Patellar and achilles reflexes 2+ b/l.  Skin: Skin is warm and dry. No rash noted. She is not diaphoretic. No erythema. No pallor.  Psychiatric: She has a normal mood and affect. Her behavior is normal.    ED Course  Procedures (including critical care time) DIAGNOSTIC STUDIES: Oxygen Saturation is 100% on RA, normal by my interpretation.   COORDINATION OF CARE: 8:19 PM- Will provide NSAID and advised to follow up with PCP. Advised pt to RICE right knee and will order a knee sleeve and crutches. Pt verbalizes understanding and agrees to plan.  Medications  - No data to display  Labs Review Labs Reviewed - No data to display Imaging Review Dg Knee Complete 4 Views Right  03/05/2014   CLINICAL DATA:  Fall 1 week ago, anterior knee pain  EXAM: RIGHT KNEE - COMPLETE 4+ VIEW  COMPARISON:  02/03/2014  FINDINGS: No fracture or dislocation is seen.  Mild degenerative changes with sharpening of the tibial spines and medial compartment osteophytosis.  No definite suprapatellar joint effusion.  IMPRESSION: No fracture or dislocation is seen.   Electronically Signed   By: Julian Hy M.D.   On: 03/05/2014 20:13     EKG Interpretation None      MDM   Final diagnoses:  Knee pain, right    Uncomplicated right knee pain - patient neurovascularly intact. No laxity appreciated. No evidence of septic joint. No sensory or motor deficits appreciated. Patient ambulates with antalgic gait. No red flags or signs concerning for cauda equina given fall 1 week ago. X-ray negative for fracture or dislocation. Patient stable and appropriate for discharge with crutches and knee sleeve. Orthopedic referral provided and RICE advised. Have also stressed to the patient the need for follow up with her primary care provider as she has been seen multiple times in the emergency department without any primary care followup. Return precautions provided and patient agreeable to plan with no unaddressed concerns.  I personally performed the services described in this documentation, which was scribed in my presence. The recorded information has been reviewed and is accurate.    Antonietta Breach, PA-C 03/05/14 2031  Antonietta Breach, PA-C 03/05/14 2032

## 2014-03-05 NOTE — Discharge Instructions (Signed)
Arthralgia °Your caregiver has diagnosed you as suffering from an arthralgia. Arthralgia means there is pain in a joint. This can come from many reasons including: °· Bruising the joint which causes soreness (inflammation) in the joint. °· Wear and tear on the joints which occur as we grow older (osteoarthritis). °· Overusing the joint. °· Various forms of arthritis. °· Infections of the joint. °Regardless of the cause of pain in your joint, most of these different pains respond to anti-inflammatory drugs and rest. The exception to this is when a joint is infected, and these cases are treated with antibiotics, if it is a bacterial infection. °HOME CARE INSTRUCTIONS  °· Rest the injured area for as long as directed by your caregiver. Then slowly start using the joint as directed by your caregiver and as the pain allows. Crutches as directed may be useful if the ankles, knees or hips are involved. If the knee was splinted or casted, continue use and care as directed. If an stretchy or elastic wrapping bandage has been applied today, it should be removed and re-applied every 3 to 4 hours. It should not be applied tightly, but firmly enough to keep swelling down. Watch toes and feet for swelling, bluish discoloration, coldness, numbness or excessive pain. If any of these problems (symptoms) occur, remove the ace bandage and re-apply more loosely. If these symptoms persist, contact your caregiver or return to this location. °· For the first 24 hours, keep the injured extremity elevated on pillows while lying down. °· Apply ice for 15-20 minutes to the sore joint every couple hours while awake for the first half day. Then 03-04 times per day for the first 48 hours. Put the ice in a plastic bag and place a towel between the bag of ice and your skin. °· Wear any splinting, casting, elastic bandage applications, or slings as instructed. °· Only take over-the-counter or prescription medicines for pain, discomfort, or fever as  directed by your caregiver. Do not use aspirin immediately after the injury unless instructed by your physician. Aspirin can cause increased bleeding and bruising of the tissues. °· If you were given crutches, continue to use them as instructed and do not resume weight bearing on the sore joint until instructed. °Persistent pain and inability to use the sore joint as directed for more than 2 to 3 days are warning signs indicating that you should see a caregiver for a follow-up visit as soon as possible. Initially, a hairline fracture (break in bone) may not be evident on X-rays. Persistent pain and swelling indicate that further evaluation, non-weight bearing or use of the joint (use of crutches or slings as instructed), or further X-rays are indicated. X-rays may sometimes not show a small fracture until a week or 10 days later. Make a follow-up appointment with your own caregiver or one to whom we have referred you. A radiologist (specialist in reading X-rays) may read your X-rays. Make sure you know how you are to obtain your X-ray results. Do not assume everything is normal if you do not hear from us. °SEEK MEDICAL CARE IF: °Bruising, swelling, or pain increases. °SEEK IMMEDIATE MEDICAL CARE IF:  °· Your fingers or toes are numb or blue. °· The pain is not responding to medications and continues to stay the same or get worse. °· The pain in your joint becomes severe. °· You develop a fever over 102° F (38.9° C). °· It becomes impossible to move or use the joint. °MAKE SURE YOU:  °·   Understand these instructions. °· Will watch your condition. °· Will get help right away if you are not doing well or get worse. °Document Released: 12/02/2005 Document Revised: 02/24/2012 Document Reviewed: 07/20/2008 °ExitCare® Patient Information ©2014 [employer]. °RICE: Routine Care for Injuries °The routine care of many injuries includes Rest, Ice, Compression, and Elevation (RICE). °HOME CARE INSTRUCTIONS °· Rest is needed  to allow your body to heal. Routine activities can usually be resumed when comfortable. Injured tendons and bones can take up to 6 weeks to heal. Tendons are the cord-like structures that attach muscle to bone. °· Ice following an injury helps keep the swelling down and reduces pain. °· Put ice in a plastic bag. °· Place a towel between your skin and the bag. °· Leave the ice on for 15-20 minutes, 03-04 times a day. Do this while awake, for the first 24 to 48 hours. After that, continue as directed by your caregiver. °· Compression helps keep swelling down. It also gives support and helps with discomfort. If an elastic bandage has been applied, it should be removed and reapplied every 3 to 4 hours. It should not be applied tightly, but firmly enough to keep swelling down. Watch fingers or toes for swelling, bluish discoloration, coldness, numbness, or excessive pain. If any of these problems occur, remove the bandage and reapply loosely. Contact your caregiver if these problems continue. °· Elevation helps reduce swelling and decreases pain. With extremities, such as the arms, hands, legs, and feet, the injured area should be placed near or above the level of the heart, if possible. °SEEK IMMEDIATE MEDICAL CARE IF: °· You have persistent pain and swelling. °· You develop redness, numbness, or unexpected weakness. °· Your symptoms are getting worse rather than improving after several days. °These symptoms may indicate that further evaluation or further X-rays are needed. Sometimes, X-rays may not show a small broken bone (fracture) until 1 week or 10 days later. Make a follow-up appointment with your caregiver. Ask when your X-ray results will be ready. Make sure you get your X-ray results. °Document Released: 03/16/2001 Document Revised: 02/24/2012 Document Reviewed: 05/03/2011 °ExitCare® Patient Information ©2014 [employer]. ° °

## 2014-03-06 NOTE — ED Provider Notes (Signed)
Medical screening examination/treatment/procedure(s) were performed by non-physician practitioner and as supervising physician I was immediately available for consultation/collaboration.    Megan E Docherty, MD 03/06/14 1140 

## 2014-03-08 ENCOUNTER — Emergency Department (HOSPITAL_COMMUNITY)
Admission: EM | Admit: 2014-03-08 | Discharge: 2014-03-08 | Disposition: A | Discharge status: Home / Self Care | Payer: Medicaid Other | Attending: Emergency Medicine | Admitting: Emergency Medicine

## 2014-03-08 ENCOUNTER — Emergency Department (HOSPITAL_COMMUNITY): Payer: Medicaid Other

## 2014-03-08 ENCOUNTER — Encounter (HOSPITAL_COMMUNITY): Payer: Self-pay | Admitting: Emergency Medicine

## 2014-03-08 ENCOUNTER — Ambulatory Visit (INDEPENDENT_AMBULATORY_CARE_PROVIDER_SITE_OTHER): Payer: Medicaid Other | Admitting: Cardiology

## 2014-03-08 VITALS — BP 111/74 | HR 91

## 2014-03-08 DIAGNOSIS — I4949 Other premature depolarization: Secondary | ICD-10-CM

## 2014-03-08 DIAGNOSIS — R5383 Other fatigue: Secondary | ICD-10-CM

## 2014-03-08 DIAGNOSIS — E669 Obesity, unspecified: Secondary | ICD-10-CM | POA: Insufficient documentation

## 2014-03-08 DIAGNOSIS — G8929 Other chronic pain: Secondary | ICD-10-CM | POA: Insufficient documentation

## 2014-03-08 DIAGNOSIS — I5032 Chronic diastolic (congestive) heart failure: Secondary | ICD-10-CM | POA: Insufficient documentation

## 2014-03-08 DIAGNOSIS — R42 Dizziness and giddiness: Secondary | ICD-10-CM | POA: Insufficient documentation

## 2014-03-08 DIAGNOSIS — Z7982 Long term (current) use of aspirin: Secondary | ICD-10-CM | POA: Insufficient documentation

## 2014-03-08 DIAGNOSIS — S3981XA Other specified injuries of abdomen, initial encounter: Secondary | ICD-10-CM | POA: Insufficient documentation

## 2014-03-08 DIAGNOSIS — F411 Generalized anxiety disorder: Secondary | ICD-10-CM | POA: Insufficient documentation

## 2014-03-08 DIAGNOSIS — I10 Essential (primary) hypertension: Secondary | ICD-10-CM | POA: Insufficient documentation

## 2014-03-08 DIAGNOSIS — R296 Repeated falls: Secondary | ICD-10-CM | POA: Insufficient documentation

## 2014-03-08 DIAGNOSIS — Z8719 Personal history of other diseases of the digestive system: Secondary | ICD-10-CM | POA: Insufficient documentation

## 2014-03-08 DIAGNOSIS — Y9389 Activity, other specified: Secondary | ICD-10-CM | POA: Insufficient documentation

## 2014-03-08 DIAGNOSIS — S8000XA Contusion of unspecified knee, initial encounter: Secondary | ICD-10-CM | POA: Insufficient documentation

## 2014-03-08 DIAGNOSIS — M129 Arthropathy, unspecified: Secondary | ICD-10-CM | POA: Insufficient documentation

## 2014-03-08 DIAGNOSIS — I252 Old myocardial infarction: Secondary | ICD-10-CM | POA: Insufficient documentation

## 2014-03-08 DIAGNOSIS — R110 Nausea: Secondary | ICD-10-CM | POA: Insufficient documentation

## 2014-03-08 DIAGNOSIS — Z559 Problems related to education and literacy, unspecified: Secondary | ICD-10-CM | POA: Insufficient documentation

## 2014-03-08 DIAGNOSIS — R5381 Other malaise: Secondary | ICD-10-CM | POA: Insufficient documentation

## 2014-03-08 DIAGNOSIS — Z880 Allergy status to penicillin: Secondary | ICD-10-CM | POA: Insufficient documentation

## 2014-03-08 DIAGNOSIS — J441 Chronic obstructive pulmonary disease with (acute) exacerbation: Secondary | ICD-10-CM | POA: Insufficient documentation

## 2014-03-08 DIAGNOSIS — J45901 Unspecified asthma with (acute) exacerbation: Secondary | ICD-10-CM

## 2014-03-08 DIAGNOSIS — E039 Hypothyroidism, unspecified: Secondary | ICD-10-CM | POA: Insufficient documentation

## 2014-03-08 DIAGNOSIS — Y929 Unspecified place or not applicable: Secondary | ICD-10-CM | POA: Insufficient documentation

## 2014-03-08 DIAGNOSIS — W19XXXA Unspecified fall, initial encounter: Secondary | ICD-10-CM

## 2014-03-08 DIAGNOSIS — Z9889 Other specified postprocedural states: Secondary | ICD-10-CM | POA: Insufficient documentation

## 2014-03-08 DIAGNOSIS — I493 Ventricular premature depolarization: Secondary | ICD-10-CM

## 2014-03-08 DIAGNOSIS — Z79899 Other long term (current) drug therapy: Secondary | ICD-10-CM | POA: Insufficient documentation

## 2014-03-08 DIAGNOSIS — R002 Palpitations: Secondary | ICD-10-CM

## 2014-03-08 DIAGNOSIS — Z791 Long term (current) use of non-steroidal anti-inflammatories (NSAID): Secondary | ICD-10-CM | POA: Insufficient documentation

## 2014-03-08 DIAGNOSIS — Z87891 Personal history of nicotine dependence: Secondary | ICD-10-CM | POA: Insufficient documentation

## 2014-03-08 LAB — COMPREHENSIVE METABOLIC PANEL
ALBUMIN: 3.9 g/dL (ref 3.5–5.2)
ALT: 9 U/L (ref 0–35)
AST: 18 U/L (ref 0–37)
Alkaline Phosphatase: 101 U/L (ref 39–117)
BILIRUBIN TOTAL: 0.6 mg/dL (ref 0.3–1.2)
BUN: 11 mg/dL (ref 6–23)
CO2: 21 meq/L (ref 19–32)
CREATININE: 0.82 mg/dL (ref 0.50–1.10)
Calcium: 9.2 mg/dL (ref 8.4–10.5)
Chloride: 101 mEq/L (ref 96–112)
GFR calc Af Amer: >90 mL/min (ref >90)
GFR, EST NON AFRICAN AMERICAN: 78 mL/min — AB (ref >90)
Glucose, Bld: 89 mg/dL (ref 70–99)
Potassium: 4.3 mEq/L (ref 3.7–5.3)
Sodium: 138 mEq/L (ref 137–147)
Total Protein: 7.2 g/dL (ref 6.0–8.3)

## 2014-03-08 LAB — CBC WITH DIFFERENTIAL/PLATELET
BASOS PCT: 1 % (ref 0–1)
Basophils Absolute: 0 10*3/uL (ref 0.0–0.1)
Eosinophils Absolute: 0.2 10*3/uL (ref 0.0–0.7)
Eosinophils Relative: 3 % (ref 0–5)
HEMATOCRIT: 39.6 % (ref 36.0–46.0)
HEMOGLOBIN: 13.5 g/dL (ref 12.0–15.0)
Lymphocytes Relative: 37 % (ref 12–46)
Lymphs Abs: 2.7 10*3/uL (ref 0.7–4.0)
MCH: 29.1 pg (ref 26.0–34.0)
MCHC: 34.1 g/dL (ref 30.0–36.0)
MCV: 85.3 fL (ref 78.0–100.0)
MONO ABS: 0.5 10*3/uL (ref 0.1–1.0)
MONOS PCT: 7 % (ref 3–12)
NEUTROS ABS: 3.8 10*3/uL (ref 1.7–7.7)
Neutrophils Relative %: 52 % (ref 43–77)
Platelets: 317 10*3/uL (ref 150–400)
RBC: 4.64 MIL/uL (ref 3.87–5.11)
RDW: 14.4 % (ref 11.5–15.5)
WBC: 7.3 10*3/uL (ref 4.0–10.5)

## 2014-03-08 LAB — I-STAT TROPONIN, ED: TROPONIN I, POC: 0 ng/mL (ref 0.00–0.08)

## 2014-03-08 MED ORDER — METOPROLOL SUCCINATE ER 25 MG PO TB24: ORAL_TABLET | ORAL | Status: DC

## 2014-03-08 MED ORDER — METOPROLOL SUCCINATE 12.5 MG HALF TABLET: 37.5000 mg | ORAL_TABLET | Freq: Every day | ORAL | Status: DC

## 2014-03-08 MED ORDER — METOPROLOL SUCCINATE ER 25 MG PO TB24
37.5000 mg | ORAL_TABLET | Freq: Every day | ORAL | Status: DC
  Administered 2014-03-08: 37.5 mg via ORAL
  Filled 2014-03-08: qty 1

## 2014-03-08 NOTE — ED Notes (Signed)
Pt made aware MD in trauma x 2; unable to see her at this time.

## 2014-03-08 NOTE — ED Notes (Signed)
MD at bedside. 

## 2014-03-08 NOTE — Patient Instructions (Signed)
Your physician recommends that you schedule a follow-up appointment in: 4-6 [place] METOPROLOL TO ONE AND ONE HALF TABLETS ONCE DAILY

## 2014-03-08 NOTE — ED Notes (Signed)
Dr Stevie Kern in with pt.

## 2014-03-08 NOTE — ED Notes (Signed)
Patient transported to X-ray 

## 2014-03-08 NOTE — Discharge Instructions (Signed)
Contusion  A contusion is a deep bruise. Contusions happen when an injury causes bleeding under the skin. Signs of bruising include pain, puffiness (swelling), and discolored skin. The contusion may turn blue, purple, or yellow.  HOME CARE   · Put ice on the injured area.  · Put ice in a plastic bag.  · Place a towel between your skin and the bag.  · Leave the ice on for 15-20 minutes, 03-04 times a day.  · Only take medicine as told by your doctor.  · Rest the injured area.  · If possible, raise (elevate) the injured area to lessen puffiness.  GET HELP RIGHT AWAY IF:   · You have more bruising or puffiness.  · You have pain that is getting worse.  · Your puffiness or pain is not helped by medicine.  MAKE SURE YOU:   · Understand these instructions.  · Will watch your condition.  · Will get help right away if you are not doing well or get worse.  Document Released: 05/20/2008 Document Revised: 02/24/2012 Document Reviewed: 10/07/2011  ExitCare® Patient Information ©2014 [employer].

## 2014-03-08 NOTE — ED Notes (Signed)
Pt reports falling out of her shower over a week ago, had injured right knee. Now has swelling to right lower leg and ankle, pain is from knee down to foot. Now having sob and mid upper abd pains and nausea, feels fatigue and lightheaded.

## 2014-03-11 ENCOUNTER — Emergency Department (HOSPITAL_COMMUNITY)
Admission: EM | Admit: 2014-03-11 | Discharge: 2014-03-11 | Disposition: A | Discharge status: Home / Self Care | Payer: Medicaid Other | Attending: Emergency Medicine | Admitting: Emergency Medicine

## 2014-03-11 ENCOUNTER — Encounter (HOSPITAL_COMMUNITY): Payer: Self-pay | Admitting: Emergency Medicine

## 2014-03-11 ENCOUNTER — Emergency Department (INDEPENDENT_AMBULATORY_CARE_PROVIDER_SITE_OTHER)
Admission: EM | Admit: 2014-03-11 | Discharge: 2014-03-11 | Disposition: A | Discharge status: Nursing Home (Includes ICF) | Payer: Medicaid Other | Source: Home / Self Care | Attending: Emergency Medicine | Admitting: Emergency Medicine

## 2014-03-11 DIAGNOSIS — Z880 Allergy status to penicillin: Secondary | ICD-10-CM | POA: Insufficient documentation

## 2014-03-11 DIAGNOSIS — M129 Arthropathy, unspecified: Secondary | ICD-10-CM | POA: Insufficient documentation

## 2014-03-11 DIAGNOSIS — G8911 Acute pain due to trauma: Secondary | ICD-10-CM | POA: Insufficient documentation

## 2014-03-11 DIAGNOSIS — Z87891 Personal history of nicotine dependence: Secondary | ICD-10-CM | POA: Insufficient documentation

## 2014-03-11 DIAGNOSIS — Z7982 Long term (current) use of aspirin: Secondary | ICD-10-CM | POA: Insufficient documentation

## 2014-03-11 DIAGNOSIS — J4489 Other specified chronic obstructive pulmonary disease: Secondary | ICD-10-CM | POA: Insufficient documentation

## 2014-03-11 DIAGNOSIS — G8929 Other chronic pain: Secondary | ICD-10-CM | POA: Insufficient documentation

## 2014-03-11 DIAGNOSIS — I252 Old myocardial infarction: Secondary | ICD-10-CM | POA: Insufficient documentation

## 2014-03-11 DIAGNOSIS — M25569 Pain in unspecified knee: Secondary | ICD-10-CM | POA: Insufficient documentation

## 2014-03-11 DIAGNOSIS — R0789 Other chest pain: Secondary | ICD-10-CM

## 2014-03-11 DIAGNOSIS — I10 Essential (primary) hypertension: Secondary | ICD-10-CM | POA: Insufficient documentation

## 2014-03-11 DIAGNOSIS — E669 Obesity, unspecified: Secondary | ICD-10-CM | POA: Insufficient documentation

## 2014-03-11 DIAGNOSIS — J449 Chronic obstructive pulmonary disease, unspecified: Secondary | ICD-10-CM | POA: Insufficient documentation

## 2014-03-11 DIAGNOSIS — I5032 Chronic diastolic (congestive) heart failure: Secondary | ICD-10-CM | POA: Insufficient documentation

## 2014-03-11 DIAGNOSIS — E039 Hypothyroidism, unspecified: Secondary | ICD-10-CM | POA: Insufficient documentation

## 2014-03-11 DIAGNOSIS — Z559 Problems related to education and literacy, unspecified: Secondary | ICD-10-CM | POA: Insufficient documentation

## 2014-03-11 DIAGNOSIS — K219 Gastro-esophageal reflux disease without esophagitis: Secondary | ICD-10-CM | POA: Insufficient documentation

## 2014-03-11 DIAGNOSIS — I2510 Atherosclerotic heart disease of native coronary artery without angina pectoris: Secondary | ICD-10-CM | POA: Insufficient documentation

## 2014-03-11 DIAGNOSIS — Z791 Long term (current) use of non-steroidal anti-inflammatories (NSAID): Secondary | ICD-10-CM | POA: Insufficient documentation

## 2014-03-11 DIAGNOSIS — Z79899 Other long term (current) drug therapy: Secondary | ICD-10-CM | POA: Insufficient documentation

## 2014-03-11 DIAGNOSIS — Z8659 Personal history of other mental and behavioral disorders: Secondary | ICD-10-CM | POA: Insufficient documentation

## 2014-03-11 DIAGNOSIS — R071 Chest pain on breathing: Secondary | ICD-10-CM | POA: Insufficient documentation

## 2014-03-11 DIAGNOSIS — Z9889 Other specified postprocedural states: Secondary | ICD-10-CM | POA: Insufficient documentation

## 2014-03-11 DIAGNOSIS — I209 Angina pectoris, unspecified: Secondary | ICD-10-CM

## 2014-03-11 LAB — BASIC METABOLIC PANEL
BUN: 15 mg/dL (ref 6–23)
CO2: 24 mEq/L (ref 19–32)
Calcium: 9.4 mg/dL (ref 8.4–10.5)
Chloride: 102 mEq/L (ref 96–112)
Creatinine, Ser: 0.88 mg/dL (ref 0.50–1.10)
GFR, EST AFRICAN AMERICAN: 83 mL/min — AB (ref >90)
GFR, EST NON AFRICAN AMERICAN: 72 mL/min — AB (ref >90)
Glucose, Bld: 89 mg/dL (ref 70–99)
Potassium: 4.5 mEq/L (ref 3.7–5.3)
SODIUM: 141 meq/L (ref 137–147)

## 2014-03-11 LAB — CBC
HCT: 40.6 % (ref 36.0–46.0)
Hemoglobin: 13.8 g/dL (ref 12.0–15.0)
MCH: 28.8 pg (ref 26.0–34.0)
MCHC: 34 g/dL (ref 30.0–36.0)
MCV: 84.8 fL (ref 78.0–100.0)
PLATELETS: 281 10*3/uL (ref 150–400)
RBC: 4.79 MIL/uL (ref 3.87–5.11)
RDW: 14.2 % (ref 11.5–15.5)
WBC: 8.7 10*3/uL (ref 4.0–10.5)

## 2014-03-11 LAB — PRO B NATRIURETIC PEPTIDE: Pro B Natriuretic peptide (BNP): 45.2 pg/mL (ref 0–125)

## 2014-03-11 LAB — I-STAT TROPONIN, ED: TROPONIN I, POC: 0 ng/mL (ref 0.00–0.08)

## 2014-03-11 MED ORDER — SODIUM CHLORIDE 0.9 % IV SOLN
INTRAVENOUS | Status: DC
  Administered 2014-03-11 (×2): via INTRAVENOUS

## 2014-03-11 MED ORDER — NITROGLYCERIN 0.4 MG SL SUBL
SUBLINGUAL_TABLET | SUBLINGUAL | Status: AC
  Filled 2014-03-11: qty 1

## 2014-03-11 MED ORDER — NITROGLYCERIN 0.4 MG SL SUBL
0.4000 mg | SUBLINGUAL_TABLET | SUBLINGUAL | Status: AC | PRN
  Administered 2014-03-11: 0.4 mg via SUBLINGUAL

## 2014-03-11 MED ORDER — HYDROCODONE-ACETAMINOPHEN 5-325 MG PO TABS: 2.0000 | ORAL_TABLET | ORAL | Status: DC | PRN

## 2014-03-11 MED ORDER — HYDROCODONE-ACETAMINOPHEN 5-325 MG PO TABS
2.0000 | ORAL_TABLET | Freq: Once | ORAL | Status: AC
  Administered 2014-03-11: 2 via ORAL
  Filled 2014-03-11: qty 2

## 2014-03-11 NOTE — ED Provider Notes (Signed)
Chief Complaint   Chief Complaint  Patient presents with  . Chest Pain  . Back Pain    History of Present Illness    Angel Hobdy is a 58 year old female with a history of known heart disease who is under the care of Dr. Rayann Heman. She has had a history since this morning of episodes of sharp, substernal chest pain without radiation. Episodes last 5 minutes and are nonexertional. She's had 3 such episodes. They're associated with shortness of breath, sweats, and dizziness. They're not associated with nausea. The patient reports a history of a heart attack about 15 years ago, although I cannot see any definite documentation of this. She also has a history of PVCs. She ran out of her cardiac meds about a week ago and has been unable to fill them due to cost. She also slipped and fell in the shower about a week ago injuring her right knee. She has a bruise in the knee. She's been the emergency room twice with the pain. X-rays have been negative. She also complains of a two-day history of back pain. She's been in the emergency room many times with chest pain and other painful conditions.  Review of Systems    Other than noted above, the patient denies any of the following symptoms. Systemic:  No fever or chills. Pulmonary:  No cough, wheezing, shortness of breath, sputum production, hemoptysis. Cardiac:  No palpitations, rapid heartbeat, dizziness, presyncope or syncope. GI:  No abdominal pain, heartburn, nausea, or vomiting. Ext:  No leg pain or swelling.  [place]    Past medical history, family history, social history, meds, and allergies were reviewed. She is allergic to multiple medications including Darvocet, ibuprofen, tramadol, Tylenol, Celebrex, naproxen, and penicillins. She has a history of hypertension, arthritis, anxiety, chronic headaches, obesity, COPD, chronic diastolic congestive heart failure, gastroesophageal reflux, hypothyroidism, and asthma. Current meds include aspirin,  Lipitor, Qvar, Fioricet, diclofenac, Synthroid, Toprol, and Phenergan.  Physical Exam     Vital signs:  BP 142/83  Pulse 65  Temp(Src) 97.9 F (36.6 C) (Oral)  Resp 21  SpO2 100% Gen:  Alert, oriented, in no distress, skin warm and dry. Eye:  PERRL, lids and conjunctivas normal.  Sclera non-icteric. ENT:  Mucous membranes moist, pharynx clear. Neck:  Supple, no adenopathy or tenderness.  No JVD. Lungs:  Clear to auscultation, no wheezes, rales or rhonchi.  No respiratory distress. Heart:  Regular rhythm.  No gallops, murmers, clicks or rubs. Chest:  There was moderate chest wall tenderness to palpation of the sternum. Abdomen:  Soft, nontender, no organomegaly or mass.  Bowel sounds normal.  No pulsatile abdominal mass or bruit. Ext:  No edema.  No calf tenderness and Homann's sign negative.  Pulses full and equal. Skin:  Warm and dry.  No rash.  Electrocardiogram     Date: 03/11/2014  Rate: 62  Rhythm: normal sinus rhythm  QRS Axis: normal  Intervals: normal  ST/T Wave abnormalities: normal  Conduction Disutrbances:none  Narrative Interpretation: Normal sinus rhythm, normal EKG.  Old EKG Reviewed: unchanged  Course in Urgent [place]         She was begun on IV normal saline at 50 mL per hour, monitored, given oxygen at 2 L per minute via nasal cannula, and nitroglycerin.  Assessment     The encounter diagnosis was Angina pectoris.  Her story is suspicious for unstable angina.  Plan     The patient was transferred to the ED via [place] in stable condition.  Medical Decision Making   58 year old female with history of known CAD followed by Dr. Rayann Heman presents tonight with 1 day history of recurrent substernal chest pain without radiation lasting 5 minutes at a time.  No associated nausea, but has had shortness of  breath, diaphoresis, and dizziness.  She's been in the ED multiple times before with similar symptoms.  She states she ran out of her cardiac meds a week ago and has been unable to fill them due to cost.  We are abtaining an EKG, and will start IV NS, O2, and TNG.  No ASA given due to multiple allergies.    Harden Mo, MD 03/11/14 2102

## 2014-03-11 NOTE — ED Notes (Signed)
[facility] not avail; EMS has been dispatched Report given to Santiago Glad, Agricultural consultant.

## 2014-03-11 NOTE — ED Notes (Signed)
58 yo female from [facility] with c/o Right Upper chest pain that started this am around 0900. Reports that pain is not present when not coughing but when she coughs sharp pain to the right side of chest, flank and back. Pt recently in the ED for fall with back and Right knee pain. Pt was unable to fill Rx for pain med and is currently out of cardiac meds due to financial issues. A/O x4. Received Nitro from [facility] dropped BP 70/60 NOW 110/80 hr 60 sat 98% RA.

## 2014-03-11 NOTE — ED Provider Notes (Signed)
CSN: [id number]     Arrival date & time 03/11/14  2117 History   First MD Initiated Contact with Patient 03/11/14 2130     Chief Complaint  Patient presents with  . Chest Pain     (Consider location/radiation/quality/duration/timing/severity/associated sxs/prior Treatment) HPI Comments: Patient presents to ER for evaluation of chest pain. Patient has been experiencing intermittent episodes of right upper chest pain. This started yesterday and have been recurrent. She reports a sharp, stabbing pain that occurs randomly and lasts for 5 minutes. She has had a cough recently and reports that the pain worsens when she coughs. She is not short of breath. No nausea or diaphoresis. Patient did have a recent fall, but does not think that she injured her chest when she fell. She did injure her knee. She was seen in ER for that.  Patient was seen in urgent care Center. She was given nitroglycerin which dropped her blood pressure. She was transferred to the ER for further evaluation.  Patient is a 58 y.o. female presenting with chest pain.  Chest Pain   Past Medical History  Diagnosis Date  . Chest pain     a. s/p reported MI in 1997;  b. 09/2012 Cath: nl cors, nl LV ([place]);  c. 05/2012 Non-ischemic myoview.  . Hypertension   . Arthritis   . Problem with literacy   . Shortness of breath   . Anxiety   . Bronchitis   . Chronic headaches   . Obesity   . COPD (chronic obstructive pulmonary disease)   . Chronic diastolic CHF (congestive heart failure)     a. 12/2011 Echo: nl ef, Gr 1 DD;  b. 11/2012 Echo: EF 45-50%, no significant valvular abnormalities.  Marland Kitchen GERD (gastroesophageal reflux disease)   . Myocardial infarction 1997    self reported  . Hypothyroidism   . Asthma   . Chronic chest pain    Past Surgical History  Procedure Laterality Date  . Ectopic pregnancy surgery      right  . Nm myoview ltd  June 2013    no reversible ischemia  . Cardiac catheterization  October 2012    No  coronary artery disease  . Cardiac catheterization  2012    normal coronary arteries  . Nm myoview ltd  2012, 2013    normal  . 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 Knee arthroscopy  08/13/13    right   Family History  Problem Relation Age of Onset  . Cancer Mother 1    lung ca  . Diabetes Mother   . Anxiety disorder Sister   . Cancer Brother 60    lung  . Colon cancer Neg Hx   . Pancreatic cancer Neg Hx   . Rectal cancer Neg Hx   . Stomach cancer Neg Hx    History  Substance Use Topics  . Smoking status: Former Smoker -- 1.00 packs/day for 3 years    Quit date: 12/03/2010  . Smokeless tobacco: Never Used  . Alcohol Use: No   OB History   Grav Para Term Preterm Abortions TAB SAB Ect Mult Living                 Review of Systems  Cardiovascular: Positive for chest pain.  All other systems reviewed and are negative.      Allergies  Darvocet; Ibuprofen; Tramadol; Tylenol; Celebrex; Naproxen; and Penicillins  Home Medications  Current Outpatient Rx  Name  Route  Sig  Dispense  Refill  . aspirin EC 81 MG tablet   Oral   Take 81 mg by mouth daily.         Marland Kitchen atorvastatin (LIPITOR) 40 MG tablet   Oral   Take 40 mg by mouth daily.         . beclomethasone (QVAR) 80 MCG/ACT inhaler   Inhalation   Inhale 2 puffs into the lungs 2 (two) times daily as needed (shortness of breath/ wheezing).          . butalbital-acetaminophen-caffeine (FIORICET, ESGIC) 50-325-40 MG per tablet   Oral   Take 1 tablet by mouth every 6 (six) hours as needed for headache.         . diclofenac (CATAFLAM) 50 MG tablet   Oral   Take 50 mg by mouth 2 (two) times daily.         Marland Kitchen levothyroxine (SYNTHROID, LEVOTHROID) 75 MCG tablet   Oral   Take 75 mcg by mouth daily before breakfast.         . metoprolol succinate (TOPROL-XL) 25 MG 24 hr tablet   Oral   Take 37.5 mg by mouth daily.         Marland Kitchen HYDROcodone-acetaminophen  (NORCO/VICODIN) 5-325 MG per tablet   Oral   Take 2 tablets by mouth every 4 (four) hours as needed for moderate pain.   10 tablet   0    BP 114/66  Pulse 59  Temp(Src) 97.6 F (36.4 C) (Oral)  Resp 13  SpO2 100% Physical Exam  Constitutional: She is oriented to person, place, and time. She appears well-developed and well-nourished. No distress.  HENT:  Head: Normocephalic and atraumatic.  Right Ear: Hearing normal.  Left Ear: Hearing normal.  Nose: Nose normal.  Mouth/Throat: Oropharynx is clear and moist and mucous membranes are normal.  Eyes: Conjunctivae and EOM are normal. Pupils are equal, round, and reactive to light.  Neck: Normal range of motion. Neck supple.  Cardiovascular: Regular rhythm, S1 normal and S2 normal.  Exam reveals no gallop and no friction rub.   No murmur heard. Pulmonary/Chest: Effort normal and breath sounds normal. No respiratory distress. She exhibits no tenderness.    Abdominal: Soft. Normal appearance and bowel sounds are normal. There is no hepatosplenomegaly. There is no tenderness. There is no rebound, no guarding, no tenderness at McBurney's point and negative Murphy's sign. No hernia.  Musculoskeletal: Normal range of motion.  Neurological: She is alert and oriented to person, place, and time. She has normal strength. No cranial nerve deficit or sensory deficit. Coordination normal. GCS eye subscore is 4. GCS verbal subscore is 5. GCS motor subscore is 6.  Skin: Skin is warm, dry and intact. No rash noted. No cyanosis.  Psychiatric: She has a normal mood and affect. Her speech is normal and behavior is normal. Thought content normal.    ED Course  Procedures (including critical care time) Labs Review Labs Reviewed  BASIC METABOLIC PANEL - Abnormal; Notable for the following:    GFR calc non Af Amer 72 (*)    GFR calc Af Amer 83 (*)    All other components within normal limits  CBC  PRO B NATRIURETIC PEPTIDE  I-STAT TROPOININ, ED    Imaging Review No results found.   EKG Interpretation   Date/Time:  Friday March 11 2014 21:25:55 EDT Ventricular Rate:  58 PR Interval:  148 QRS Duration: 93 QT Interval:  469 QTC Calculation: 461 R Axis:   55 Text Interpretation:  Age not entered, assumed to be  58 years old for  purpose of ECG interpretation Sinus rhythm Low voltage, precordial leads  No significant change since last tracing Confirmed by POLLINA  MD,  CHRISTOPHER [phone]) on 03/11/2014 9:37:25 PM      MDM   Final diagnoses:  Chest wall pain    Patient does have a history of coronary artery disease. She has been off her medications recently. She is complaining of chest pain, but it is extremely atypical. His right upper chest area and occurs intermittently for only 5 minutes at a time. She describes it as sharp, there is no heaviness or pressure sensation. It is not related to exertion at all. It occurs at rest. Patient's chest is exquisitely tender in the region. This is consistent with chest wall pain. Has it only occurs intermittently, there is no shortness of breath, there is no vital sign abnormality other than the transient hypotension after nitroglycerin. Patient is currently pain-free, other than the knee pain she is experiencing from her recent fall. She will be discharged to follow for primary care and cardiology. Return for any consistent and continuous chest pain.    Orpah Greek, MD 03/11/14 [phone]

## 2014-03-11 NOTE — Discharge Instructions (Signed)

## 2014-03-11 NOTE — ED Notes (Signed)
Patient had a fell approx 2 weeks ago, seen in ed 3/21 for injuries from fall-right knee pain, pain radiating down leg.  ..  Patient has come to ucc today for chest pain and low right back pain, both new today.  No change in chest pain with movement.  Back pain is aggravated by movement.  No ankle edema

## 2014-03-11 NOTE — Discharge Instructions (Signed)
We have determined that your problem requires further evaluation in the emergency department.  We will take care of your transport there.  Once at the emergency department, you will be evaluated by a provider and they will order whatever treatment or tests they deem necessary.  We cannot guarantee that they will do any specific test or do any specific treatment.  ° °

## 2014-03-15 ENCOUNTER — Ambulatory Visit: Payer: Medicaid Other | Admitting: Family Medicine

## 2014-03-15 ENCOUNTER — Telehealth: Payer: Self-pay | Admitting: Family Medicine

## 2014-03-15 ENCOUNTER — Ambulatory Visit (INDEPENDENT_AMBULATORY_CARE_PROVIDER_SITE_OTHER): Payer: Medicaid Other | Admitting: Family Medicine

## 2014-03-15 ENCOUNTER — Encounter: Payer: Self-pay | Admitting: Family Medicine

## 2014-03-15 VITALS — BP 116/77 | HR 65 | Temp 97.7°F | Wt 216.0 lb

## 2014-03-15 DIAGNOSIS — E669 Obesity, unspecified: Secondary | ICD-10-CM

## 2014-03-15 DIAGNOSIS — M25561 Pain in right knee: Secondary | ICD-10-CM

## 2014-03-15 DIAGNOSIS — F419 Anxiety disorder, unspecified: Secondary | ICD-10-CM

## 2014-03-15 DIAGNOSIS — F411 Generalized anxiety disorder: Secondary | ICD-10-CM

## 2014-03-15 DIAGNOSIS — M25569 Pain in unspecified knee: Secondary | ICD-10-CM

## 2014-03-15 DIAGNOSIS — M549 Dorsalgia, unspecified: Secondary | ICD-10-CM

## 2014-03-15 MED ORDER — PAROXETINE HCL 20 MG PO TABS: 20.0000 mg | ORAL_TABLET | Freq: Every day | ORAL | Status: DC

## 2014-03-15 MED ORDER — HYDROCODONE-ACETAMINOPHEN 5-325 MG PO TABS: 2.0000 | ORAL_TABLET | ORAL | Status: DC | PRN

## 2014-03-15 NOTE — Progress Notes (Signed)
ELECTROPHYSIOLOGY OFFICE NOTE   Patient ID: [name] MRN: [id number], DOB/AGE: 1956/09/08   Date of Visit: 03/08/2014  Primary Physician: Gwendlyn Deutscher, MD Primary Cardiologist: Johnsie Cancel, MD Primary EP: Rayann Heman, MD Reason for Visit: Overdue 6-week follow-up  History of Present Illness  [name] is a 58 y.o. female with HTN, normal LVEF by echo August 2014, normal coronary arteries by cath June 2013, COPD and symptomatic PVCs who presents today for overdue 6-week electrophysiology followup. She was seen by Dr. Rayann Heman in Jan 2015 for palpitations / symptomatic PVCs. She was restarted on Toprol XL.   Since last being seen in our clinic, she reports she is still experiencing skipping palpitations. They do not seem to be occurring more frequently but they are not significantly improved either. She denies chest pain or shortness of breath. She denies dizziness, near syncope or syncope. She denies LE swelling, orthopnea or PND. She reports compliance with medications.   Past Medical History Past Medical History  Diagnosis Date  . Chest pain     a. s/p reported MI in 1997;  b. 09/2012 Cath: nl cors, nl LV ([place]);  c. 05/2012 Non-ischemic myoview.  . Hypertension   . Arthritis   . Problem with literacy   . Shortness of breath   . Anxiety   . Bronchitis   . Chronic headaches   . Obesity   . COPD (chronic obstructive pulmonary disease)   . Chronic diastolic CHF (congestive heart failure)     a. 12/2011 Echo: nl ef, Gr 1 DD;  b. 11/2012 Echo: EF 45-50%, no significant valvular abnormalities.  Marland Kitchen GERD (gastroesophageal reflux disease)   . Myocardial infarction 1997    self reported  . Hypothyroidism   . Asthma   . Chronic chest pain     Past Surgical History Past Surgical History  Procedure Laterality Date  . Ectopic pregnancy surgery      right  . Nm myoview ltd  June 2013    no reversible ischemia  . Cardiac catheterization  October 2012    No coronary artery  disease  . Cardiac catheterization  2012    normal coronary arteries  . Nm myoview ltd  2012, 2013    normal  . 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 Knee arthroscopy  08/13/13    right    Allergies/Intolerances Allergies  Allergen Reactions  . Darvocet [Propoxyphene N-Acetaminophen] Nausea And Vomiting  . Ibuprofen Other (See Comments)    Upset stomach   . Tramadol Nausea And Vomiting  . Tylenol [Acetaminophen] Nausea And Vomiting  . Celebrex [Celecoxib] Nausea And Vomiting and Rash  . Naproxen Nausea And Vomiting and Rash  . Penicillins Hives    Current Home Medications Current Outpatient Prescriptions  Medication Sig Dispense Refill  . aspirin EC 81 MG tablet Take 81 mg by mouth daily.      Marland Kitchen atorvastatin (LIPITOR) 40 MG tablet Take 40 mg by mouth daily.      . beclomethasone (QVAR) 80 MCG/ACT inhaler Inhale 2 puffs into the lungs 2 (two) times daily as needed (shortness of breath/ wheezing).       Marland Kitchen levothyroxine (SYNTHROID, LEVOTHROID) 75 MCG tablet Take 75 mcg by mouth daily before breakfast.      . butalbital-acetaminophen-caffeine (FIORICET, ESGIC) 50-325-40 MG per tablet Take 1 tablet by mouth every 6 (six) hours as needed for headache.      . diclofenac (CATAFLAM)  50 MG tablet Take 50 mg by mouth 2 (two) times daily.      . metoprolol succinate (TOPROL-XL) 25 MG 24 hr tablet Take 37.5 mg by mouth daily.      Marland Kitchen PARoxetine (PAXIL) 20 MG tablet Take 1 tablet (20 mg total) by mouth daily.  90 tablet  1   No current facility-administered medications for this visit.    Social History History   Social History  . Marital Status: Legally Separated    Spouse Name: N/A    Number of Children: N/A  . Years of Education: N/A   Occupational History  . Not on file.   Social History Main Topics  . Smoking status: Former Smoker -- 1.00 packs/day for 3 years    Quit date: 12/03/2010  . Smokeless tobacco: Never Used  . Alcohol Use: No  .  Drug Use: No  . Sexual Activity: No   Other Topics Concern  . Not on file   Social History Narrative   Lives with a female friend, just roommates, separated.  Has 4 kids, all grown.  Finished 10th grade.  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  Lives in [place]: No chills, fever, night sweats or weight changes Cardiovascular: No chest pain, dyspnea on exertion, edema, orthopnea, paroxysmal nocturnal dyspnea Dermatological: No rash, lesions or masses Respiratory: No cough, dyspnea Urologic: No hematuria, dysuria Abdominal: No nausea, vomiting, diarrhea, bright red blood per rectum, melena, or hematemesis Neurologic: No visual changes, weakness, changes in mental status All other systems reviewed and are otherwise negative except as noted above.  Physical Exam Vitals: Blood pressure 111/74, pulse 91.  General: Well developed 58 y.o. female in no acute distress. HEENT: Normocephalic, atraumatic. EOMs intact. Sclera nonicteric. Oropharynx clear.  Neck: Supple. No JVD. Lungs: Respirations regular and unlabored, CTA bilaterally. No wheezes, rales or rhonchi. Heart: RRR. S1, S2 present. No murmurs, rub, S3 or S4. Abdomen: Soft, non-distended.  Extremities: No clubbing, cyanosis or edema. PT/Radials 2+ and equal bilaterally. Psych: Normal affect. Neuro: Alert and oriented X 3. Moves all extremities spontaneously.   Diagnostics  Echocardiogram August 2014 Study Conclusions Left ventricle: The cavity size was normal. Wall thickness was normal. Systolic function was normal. The estimated ejection fraction was in the range of 55% to 60%. Wall motion was normal; there were no regional wall motion abnormalities. Left ventricular diastolic function parameters were normal.  Recent labs    Component Value Date/Time   WBC 8.7 03/11/2014 2157   RBC 4.79 03/11/2014 2157   HGB 13.8 03/11/2014 2157   HCT 40.6 03/11/2014 2157   PLT 281 03/11/2014 2157     MCV 84.8 03/11/2014 2157   MCH 28.8 03/11/2014 2157   MCHC 34.0 03/11/2014 2157   RDW 14.2 03/11/2014 2157   LYMPHSABS 2.7 03/08/2014 1618   MONOABS 0.5 03/08/2014 1618   EOSABS 0.2 03/08/2014 1618   BASOSABS 0.0 03/08/2014 1618      Component Value Date/Time   NA 141 03/11/2014 2157   K 4.5 03/11/2014 2157   CL 102 03/11/2014 2157   CO2 24 03/11/2014 2157   GLUCOSE 89 03/11/2014 2157   BUN 15 03/11/2014 2157   CREATININE 0.88 03/11/2014 2157   CREATININE 0.96 12/18/2011 1511   CALCIUM 9.4 03/11/2014 2157   GFRNONAA 72* 03/11/2014 2157   GFRAA 83* 03/11/2014 2157    12-lead ECG today - SR at 81 bpm, one fused PVC; PR 144, QRS 80,  QTc 460  Assessment and Plan 1. Symptomatic PVCs Increase metoprolol to 37.5 mg once daily Follow-up with Dr. Rayann Heman in 4-6 weeks  Signed, Ileene Hutchinson, PA-C 03/15/2014, 6:40 PM

## 2014-03-15 NOTE — Telephone Encounter (Signed)
Patient came in to say she was called and that medicaid does not pay for physical therapy.

## 2014-03-15 NOTE — Assessment & Plan Note (Signed)
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 PT recommended with orthopedic referral. Home exercise discussed to help relieve her pain. Will consider pain clinic management as well.

## 2014-03-15 NOTE — Telephone Encounter (Signed)
Ok, let her know she was also referred to orthopedic and can continue home knee exercise.Thanks.

## 2014-03-15 NOTE — Assessment & Plan Note (Signed)
This is likely contributing to her symptoms and frequent ED visit. PHQ2 score of 1 today for depression screening.  Patient restarted on Paxil 20 mg daily since she had done well on it.

## 2014-03-15 NOTE — Assessment & Plan Note (Signed)
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 Xray of her back done in feb was negative. She might benefit from MRI. I reviewed her allergies, she is allergic to most pain medication. I wanted to refill her Vicodin since she used it recently but she stated it makes her break out in hives. Steroid injection recommended but she declined. Plan to refer to PT and orthopedic.

## 2014-03-15 NOTE — Patient Instructions (Signed)
Knee Exercises EXERCISES RANGE OF MOTION(ROM) AND STRETCHING EXERCISES These exercises may help you when beginning to rehabilitate your injury. Your symptoms may resolve with or without further involvement from your physician, physical therapist or athletic trainer. While completing these exercises, remember:   Restoring tissue flexibility helps normal motion to return to the joints. This allows healthier, less painful movement and activity.  An effective stretch should be held for at least 30 seconds.  A stretch should never be painful. You should only feel a gentle lengthening or release in the stretched tissue. STRETCH - Knee Extension, Prone  Lie on your stomach on a firm surface, such as a bed or countertop. Place your right / left knee and leg just beyond the edge of the surface. You may wish to place a towel under the far end of your right / left thigh for comfort.  Relax your leg muscles and allow gravity to straighten your knee. Your clinician may advise you to add an ankle weight if more resistance is helpful for you.  You should feel a stretch in the back of your right / left knee. Hold this position for __________ seconds. Repeat __________ times. Complete this stretch __________ times per day. * Your physician, physical therapist or athletic trainer may ask you to add ankle weight to enhance your stretch.  RANGE OF MOTION - Knee Flexion, Active  Lie on your back with both knees straight. (If this causes back discomfort, bend your opposite knee, placing your foot flat on the floor.)  Slowly slide your heel back toward your buttocks until you feel a gentle stretch in the front of your knee or thigh.  Hold for __________ seconds. Slowly slide your heel back to the starting position. Repeat __________ times. Complete this exercise __________ times per day.  STRETCH - Quadriceps, Prone   Lie on your stomach on a firm surface, such as a bed or padded floor.  Bend your right /  left knee and grasp your ankle. If you are unable to reach, your ankle or pant leg, use a belt around your foot to lengthen your reach.  Gently pull your heel toward your buttocks. Your knee should not slide out to the side. You should feel a stretch in the front of your thigh and/or knee.  Hold this position for __________ seconds. Repeat __________ times. Complete this stretch __________ times per day.  STRETCH  Hamstrings, Supine   Lie on your back. Loop a belt or towel over the ball of your right / left foot.  Straighten your right / left knee and slowly pull on the belt to raise your leg. Do not allow the right / left knee to bend. Keep your opposite leg flat on the floor.  Raise the leg until you feel a gentle stretch behind your right / left knee or thigh. Hold this position for __________ seconds. Repeat __________ times. Complete this stretch __________ times per day.  STRENGTHENING EXERCISES These exercises may help you when beginning to rehabilitate your injury. They may resolve your symptoms with or without further involvement from your physician, physical therapist or athletic trainer. While completing these exercises, remember:   Muscles can gain both the endurance and the strength needed for everyday activities through controlled exercises.  Complete these exercises as instructed by your physician, physical therapist or athletic trainer. Progress the resistance and repetitions only as guided.  You may experience muscle soreness or fatigue, but the pain or discomfort you are trying to eliminate should   never worsen during these exercises. If this pain does worsen, stop and make certain you are following the directions exactly. If the pain is still present after adjustments, discontinue the exercise until you can discuss the trouble with your clinician. STRENGTH - Quadriceps, Isometrics  Lie on your back with your right / left leg extended and your opposite knee bent.  Gradually  tense the muscles in the front of your right / left thigh. You should see either your knee cap slide up toward your hip or increased dimpling just above the knee. This motion will push the back of the knee down toward the floor/mat/bed on which you are lying.  Hold the muscle as tight as you can without increasing your pain for __________ seconds.  Relax the muscles slowly and completely in between each repetition. Repeat __________ times. Complete this exercise __________ times per day.  STRENGTH - Quadriceps, Short Arcs   Lie on your back. Place a __________ inch towel roll under your knee so that the knee slightly bends.  Raise only your lower leg by tightening the muscles in the front of your thigh. Do not allow your thigh to rise.  Hold this position for __________ seconds. Repeat __________ times. Complete this exercise __________ times per day.  OPTIONAL ANKLE WEIGHTS: Begin with ____________________, but DO NOT exceed ____________________. Increase in 1 pound/0.5 kilogram increments.  STRENGTH - Quadriceps, Straight Leg Raises  Quality counts! Watch for signs that the quadriceps muscle is working to insure you are strengthening the correct muscles and not "cheating" by substituting with healthier muscles.  Lay on your back with your right / left leg extended and your opposite knee bent.  Tense the muscles in the front of your right / left thigh. You should see either your knee cap slide up or increased dimpling just above the knee. Your thigh may even quiver.  Tighten these muscles even more and raise your leg 4 to 6 inches off the floor. Hold for __________ seconds.  Keeping these muscles tense, lower your leg.  Relax the muscles slowly and completely in between each repetition. Repeat __________ times. Complete this exercise __________ times per day.  STRENGTH - Hamstring, Curls  Lay on your stomach with your legs extended. (If you lay on a bed, your feet may hang over the  edge.)  Tighten the muscles in the back of your thigh to bend your right / left knee up to 90 degrees. Keep your hips flat on the bed/floor.  Hold this position for __________ seconds.  Slowly lower your leg back to the starting position. Repeat __________ times. Complete this exercise __________ times per day.  OPTIONAL ANKLE WEIGHTS: Begin with ____________________, but DO NOT exceed ____________________. Increase in 1 pound/0.5 kilogram increments.  STRENGTH  Quadriceps, Squats  Stand in a door frame so that your feet and knees are in line with the frame.  Use your hands for balance, not support, on the frame.  Slowly lower your weight, bending at the hips and knees. Keep your lower legs upright so that they are parallel with the door frame. Squat only within the range that does not increase your knee pain. Never let your hips drop below your knees.  Slowly return upright, pushing with your legs, not pulling with your hands. Repeat __________ times. Complete this exercise __________ times per day.  STRENGTH - Quadriceps, Wall Slides  Follow guidelines for form closely. Increased knee pain often results from poorly placed feet or knees.  Lean against   a smooth wall or door and walk your feet out 18-24 inches. Place your feet hip-width apart.  Slowly slide down the wall or door until your knees bend __________ degrees.* Keep your knees over your heels, not your toes, and in line with your hips, not falling to either side.  Hold for __________ seconds. Stand up to rest for __________ seconds in between each repetition. Repeat __________ times. Complete this exercise __________ times per day. * Your physician, physical therapist or athletic trainer will alter this angle based on your symptoms and progress. Document Released: 10/16/2005 Document Revised: 02/24/2012 Document Reviewed: 03/16/2009 [employer] Patient Information 2014 [employer].  

## 2014-03-15 NOTE — Assessment & Plan Note (Signed)
Diet and exercise counseling done for her to lose weight. This will help improve her knee and back pain tremendously. Patient will work on this.

## 2014-03-15 NOTE — Telephone Encounter (Signed)
Pt is aware of this. Jazmin Hartsell,CMA  

## 2014-03-15 NOTE — Progress Notes (Signed)
Subjective:     Patient ID: [name], female   DOB: 1956-03-06, 58 y.o.   MRN: [id number]  HPI Back pain:Low back pain on going for yrs, worsening over the last few months due to recent fall at home. Pain is about 10/10 in severity, Knee pain:C/O right knee pain on going for more than 2 yrs but worsening over the past few month, she fell recently at home while in her bathroom she stated she slipped on the water on the floor and fell on her knees, she had been to the ED multiple times for knee pain,her pain is about 10/10 in severity, aching in nature, she is currently not using any medication, most most of her pain medicine makes her break out in hives. Anxiety/PTSD:Patient mentioned she was diagnosed with PTSD in the past,she could not tell me what traumatic event she was exposed to in the past, she however mentioned she has anxiety for which she was placed on Paxil for months which helped, she ran out of this medication more than 6 months ago and did not call for refill. Patient denies any feeling of depression at the moment,denies suicidal ideation. Obesity: Working on weight loss.  Current Outpatient Prescriptions on File Prior to Visit  Medication Sig Dispense Refill  . aspirin EC 81 MG tablet Take 81 mg by mouth daily.      Marland Kitchen atorvastatin (LIPITOR) 40 MG tablet Take 40 mg by mouth daily.      . beclomethasone (QVAR) 80 MCG/ACT inhaler Inhale 2 puffs into the lungs 2 (two) times daily as needed (shortness of breath/ wheezing).       Marland Kitchen diclofenac (CATAFLAM) 50 MG tablet Take 50 mg by mouth 2 (two) times daily.      Marland Kitchen levothyroxine (SYNTHROID, LEVOTHROID) 75 MCG tablet Take 75 mcg by mouth daily before breakfast.      . metoprolol succinate (TOPROL-XL) 25 MG 24 hr tablet Take 37.5 mg by mouth daily.      . butalbital-acetaminophen-caffeine (FIORICET, ESGIC) 50-325-40 MG per tablet Take 1 tablet by mouth every 6 (six) hours as needed for headache.       No current  facility-administered medications on file prior to visit.   Past Medical History  Diagnosis Date  . Chest pain     a. s/p reported MI in 1997;  b. 09/2012 Cath: nl cors, nl LV ([place]);  c. 05/2012 Non-ischemic myoview.  . Hypertension   . Arthritis   . Problem with literacy   . Shortness of breath   . Anxiety   . Bronchitis   . Chronic headaches   . Obesity   . COPD (chronic obstructive pulmonary disease)   . Chronic diastolic CHF (congestive heart failure)     a. 12/2011 Echo: nl ef, Gr 1 DD;  b. 11/2012 Echo: EF 45-50%, no significant valvular abnormalities.  Marland Kitchen GERD (gastroesophageal reflux disease)   . Myocardial infarction 1997    self reported  . Hypothyroidism   . Asthma   . Chronic chest pain       Review of Systems  Respiratory: Negative.   Cardiovascular: Negative.   Gastrointestinal: Negative.   Genitourinary: Negative.   Musculoskeletal: Positive for arthralgias and back pain. Negative for joint swelling.  All other systems reviewed and are negative.   Filed Vitals:   03/15/14 0955  BP: 116/77  Pulse: 65  Temp: 97.7 F (36.5 C)  TempSrc: Oral  Weight: 216 lb (97.977 kg)  SpO2: 99%  Objective:   Physical Exam  Nursing note and vitals reviewed. Constitutional: She appears well-developed. No distress.  Obese  Cardiovascular: Normal rate, regular rhythm, normal heart sounds and intact distal pulses.   No murmur heard. Pulmonary/Chest: Effort normal and breath sounds normal. No respiratory distress. She has no wheezes. She exhibits no tenderness.  Abdominal: Soft. Bowel sounds are normal. She exhibits no distension and no mass. There is no tenderness.  Musculoskeletal: She exhibits no edema.       Right knee: She exhibits decreased range of motion. She exhibits no swelling and no effusion.       Left knee: Normal.       Lumbar back: She exhibits decreased range of motion and tenderness. She exhibits no swelling.       Assessment:     Back  pain Right knee pain Anxiety Obesity     Plan:     Check problem list.

## 2014-03-17 ENCOUNTER — Encounter: Payer: Self-pay | Admitting: Cardiology

## 2014-03-20 NOTE — ED Provider Notes (Signed)
CSN: [id number]     Arrival date & time 03/08/14  1546 History   First MD Initiated Contact with Patient 03/08/14 1720     Chief Complaint  Patient presents with  . Fall  . Shortness of Breath      HPI     Pt reports falling out of her shower over a week ago, had injured right knee. Now has swelling to right lower leg and ankle, pain is from knee down to foot. Now having sob and mid upper abd pains and nausea, feels fatigue and lightheaded. Patient denies syncope or chest pain.    Past Medical History  Diagnosis Date  . Chest pain     a. s/p reported MI in 1997;  b. 09/2012 Cath: nl cors, nl LV ([place]);  c. 05/2012 Non-ischemic myoview.  . Hypertension   . Arthritis   . Problem with literacy   . Shortness of breath   . Anxiety   . Bronchitis   . Chronic headaches   . Obesity   . COPD (chronic obstructive pulmonary disease)   . Chronic diastolic CHF (congestive heart failure)     a. 12/2011 Echo: nl ef, Gr 1 DD;  b. 11/2012 Echo: EF 45-50%, no significant valvular abnormalities.  Marland Kitchen GERD (gastroesophageal reflux disease)   . Myocardial infarction 1997    self reported  . Hypothyroidism   . Asthma   . Chronic chest pain    Past Surgical History  Procedure Laterality Date  . Ectopic pregnancy surgery      right  . Nm myoview ltd  June 2013    no reversible ischemia  . Cardiac catheterization  October 2012    No coronary artery disease  . Cardiac catheterization  2012    normal coronary arteries  . Nm myoview ltd  2012, 2013    normal  . 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 Knee arthroscopy  08/13/13    right   Family History  Problem Relation Age of Onset  . Cancer Mother 16    lung ca  . Diabetes Mother   . Anxiety disorder Sister   . Cancer Brother 60    lung  . Colon cancer Neg Hx   . Pancreatic cancer Neg Hx   . Rectal cancer Neg Hx   . Stomach cancer Neg Hx    History  Substance Use Topics  . Smoking status:  Former Smoker -- 1.00 packs/day for 3 years    Quit date: 12/03/2010  . Smokeless tobacco: Never Used  . Alcohol Use: No   OB History   Grav Para Term Preterm Abortions TAB SAB Ect Mult Living                 Review of Systems  All other systems reviewed and are negative.      Allergies  Darvocet; Ibuprofen; Tramadol; Tylenol; Celebrex; Naproxen; and Penicillins  Home Medications   Current Outpatient Rx  Name  Route  Sig  Dispense  Refill  . aspirin EC 81 MG tablet   Oral   Take 81 mg by mouth daily.         Marland Kitchen atorvastatin (LIPITOR) 40 MG tablet   Oral   Take 40 mg by mouth daily.         . beclomethasone (QVAR) 80 MCG/ACT inhaler   Inhalation   Inhale 2 puffs into the lungs 2 (two) times daily  as needed (shortness of breath/ wheezing).          . butalbital-acetaminophen-caffeine (FIORICET, ESGIC) 50-325-40 MG per tablet   Oral   Take 1 tablet by mouth every 6 (six) hours as needed for headache.         . diclofenac (CATAFLAM) 50 MG tablet   Oral   Take 50 mg by mouth 2 (two) times daily.         Marland Kitchen levothyroxine (SYNTHROID, LEVOTHROID) 75 MCG tablet   Oral   Take 75 mcg by mouth daily before breakfast.         . metoprolol succinate (TOPROL-XL) 25 MG 24 hr tablet   Oral   Take 37.5 mg by mouth daily.         Marland Kitchen PARoxetine (PAXIL) 20 MG tablet   Oral   Take 1 tablet (20 mg total) by mouth daily.   90 tablet   1    BP 121/77  Pulse 66  Temp(Src) 97.7 F (36.5 C) (Oral)  Resp 18  SpO2 99% Physical Exam  Nursing note and vitals reviewed. Constitutional: She is oriented to person, place, and time. She appears well-developed and well-nourished. No distress.  HENT:  Head: Normocephalic and atraumatic.  Eyes: Pupils are equal, round, and reactive to light.  Neck: Normal range of motion.  Cardiovascular: Normal rate and intact distal pulses.   Pulmonary/Chest: No respiratory distress.  Abdominal: Normal appearance. She exhibits no  distension.  Musculoskeletal: She exhibits tenderness (R knee).       Legs: Neurological: She is alert and oriented to person, place, and time. No cranial nerve deficit.  Skin: Skin is warm and dry. No rash noted.  Psychiatric: She has a normal mood and affect. Her behavior is normal.    ED Course  Procedures (including critical care time) Labs Review Labs Reviewed  COMPREHENSIVE METABOLIC PANEL - Abnormal; Notable for the following:    GFR calc non Af Amer 78 (*)    All other components within normal limits  CBC WITH DIFFERENTIAL  I-STAT TROPOININ, ED   Imaging Review No results found.   EKG Interpretation   Date/Time:  Tuesday March 08 2014 16:21:26 EDT Ventricular Rate:  72 PR Interval:  146 QRS Duration: 76 QT Interval:  392 QTC Calculation: 429 R Axis:   50 Text Interpretation:  Normal sinus rhythm Normal ECG Confirmed by Arnetha Silverthorne   MD, Meisha Salone ([id number]) on 03/08/2014 5:22:16 PM       After treatment in the ED the patient feels back to baseline and wants to go home. MDM   Final diagnoses:  Knee contusion  Accidental fall        Dot Lanes, MD 03/20/14 [phone]

## 2014-03-22 ENCOUNTER — Ambulatory Visit: Payer: Medicaid Other | Admitting: Physical Therapy

## 2014-03-25 ENCOUNTER — Emergency Department (HOSPITAL_COMMUNITY)
Admission: EM | Admit: 2014-03-25 | Discharge: 2014-03-25 | Disposition: A | Discharge status: Home / Self Care | Payer: Medicaid Other | Attending: Emergency Medicine | Admitting: Emergency Medicine

## 2014-03-25 ENCOUNTER — Emergency Department (HOSPITAL_COMMUNITY): Payer: Medicaid Other

## 2014-03-25 ENCOUNTER — Encounter (HOSPITAL_COMMUNITY): Payer: Self-pay | Admitting: Emergency Medicine

## 2014-03-25 DIAGNOSIS — S298XXA Other specified injuries of thorax, initial encounter: Secondary | ICD-10-CM | POA: Insufficient documentation

## 2014-03-25 DIAGNOSIS — S99919A Unspecified injury of unspecified ankle, initial encounter: Secondary | ICD-10-CM

## 2014-03-25 DIAGNOSIS — S8990XA Unspecified injury of unspecified lower leg, initial encounter: Secondary | ICD-10-CM | POA: Insufficient documentation

## 2014-03-25 DIAGNOSIS — Z791 Long term (current) use of non-steroidal anti-inflammatories (NSAID): Secondary | ICD-10-CM | POA: Insufficient documentation

## 2014-03-25 DIAGNOSIS — Z79899 Other long term (current) drug therapy: Secondary | ICD-10-CM | POA: Insufficient documentation

## 2014-03-25 DIAGNOSIS — Z880 Allergy status to penicillin: Secondary | ICD-10-CM | POA: Insufficient documentation

## 2014-03-25 DIAGNOSIS — I2510 Atherosclerotic heart disease of native coronary artery without angina pectoris: Secondary | ICD-10-CM | POA: Insufficient documentation

## 2014-03-25 DIAGNOSIS — I10 Essential (primary) hypertension: Secondary | ICD-10-CM | POA: Insufficient documentation

## 2014-03-25 DIAGNOSIS — R079 Chest pain, unspecified: Secondary | ICD-10-CM

## 2014-03-25 DIAGNOSIS — Z87891 Personal history of nicotine dependence: Secondary | ICD-10-CM | POA: Insufficient documentation

## 2014-03-25 DIAGNOSIS — Z7982 Long term (current) use of aspirin: Secondary | ICD-10-CM | POA: Insufficient documentation

## 2014-03-25 DIAGNOSIS — M129 Arthropathy, unspecified: Secondary | ICD-10-CM | POA: Insufficient documentation

## 2014-03-25 DIAGNOSIS — Z9889 Other specified postprocedural states: Secondary | ICD-10-CM | POA: Insufficient documentation

## 2014-03-25 DIAGNOSIS — E669 Obesity, unspecified: Secondary | ICD-10-CM | POA: Insufficient documentation

## 2014-03-25 DIAGNOSIS — F411 Generalized anxiety disorder: Secondary | ICD-10-CM | POA: Insufficient documentation

## 2014-03-25 DIAGNOSIS — R55 Syncope and collapse: Secondary | ICD-10-CM | POA: Insufficient documentation

## 2014-03-25 DIAGNOSIS — Y929 Unspecified place or not applicable: Secondary | ICD-10-CM | POA: Insufficient documentation

## 2014-03-25 DIAGNOSIS — R296 Repeated falls: Secondary | ICD-10-CM | POA: Insufficient documentation

## 2014-03-25 DIAGNOSIS — J449 Chronic obstructive pulmonary disease, unspecified: Secondary | ICD-10-CM | POA: Insufficient documentation

## 2014-03-25 DIAGNOSIS — Y9389 Activity, other specified: Secondary | ICD-10-CM | POA: Insufficient documentation

## 2014-03-25 DIAGNOSIS — J4489 Other specified chronic obstructive pulmonary disease: Secondary | ICD-10-CM | POA: Insufficient documentation

## 2014-03-25 DIAGNOSIS — G8929 Other chronic pain: Secondary | ICD-10-CM | POA: Insufficient documentation

## 2014-03-25 DIAGNOSIS — I5032 Chronic diastolic (congestive) heart failure: Secondary | ICD-10-CM | POA: Insufficient documentation

## 2014-03-25 DIAGNOSIS — IMO0002 Reserved for concepts with insufficient information to code with codable children: Secondary | ICD-10-CM | POA: Insufficient documentation

## 2014-03-25 DIAGNOSIS — E039 Hypothyroidism, unspecified: Secondary | ICD-10-CM | POA: Insufficient documentation

## 2014-03-25 DIAGNOSIS — M25571 Pain in right ankle and joints of right foot: Secondary | ICD-10-CM

## 2014-03-25 DIAGNOSIS — Z8719 Personal history of other diseases of the digestive system: Secondary | ICD-10-CM | POA: Insufficient documentation

## 2014-03-25 DIAGNOSIS — S99929A Unspecified injury of unspecified foot, initial encounter: Secondary | ICD-10-CM

## 2014-03-25 DIAGNOSIS — I252 Old myocardial infarction: Secondary | ICD-10-CM | POA: Insufficient documentation

## 2014-03-25 LAB — BASIC METABOLIC PANEL
BUN: 19 mg/dL (ref 6–23)
CO2: 20 mEq/L (ref 19–32)
Calcium: 9.2 mg/dL (ref 8.4–10.5)
Chloride: 100 mEq/L (ref 96–112)
Creatinine, Ser: 0.84 mg/dL (ref 0.50–1.10)
GFR calc Af Amer: 88 mL/min — ABNORMAL LOW (ref >90)
GFR calc non Af Amer: 76 mL/min — ABNORMAL LOW (ref >90)
Glucose, Bld: 89 mg/dL (ref 70–99)
Potassium: 4.1 mEq/L (ref 3.7–5.3)
Sodium: 139 mEq/L (ref 137–147)

## 2014-03-25 LAB — CBC
HCT: 41.7 % (ref 36.0–46.0)
Hemoglobin: 14 g/dL (ref 12.0–15.0)
MCH: 28.6 pg (ref 26.0–34.0)
MCHC: 33.6 g/dL (ref 30.0–36.0)
MCV: 85.1 fL (ref 78.0–100.0)
Platelets: 279 10*3/uL (ref 150–400)
RBC: 4.9 MIL/uL (ref 3.87–5.11)
RDW: 14.5 % (ref 11.5–15.5)
WBC: 8.3 10*3/uL (ref 4.0–10.5)

## 2014-03-25 LAB — I-STAT TROPONIN, ED
Troponin i, poc: 0 ng/mL (ref 0.00–0.08)
Troponin i, poc: 0 ng/mL (ref 0.00–0.08)

## 2014-03-25 MED ORDER — DICLOFENAC POTASSIUM 50 MG PO TABS: 50.0000 mg | ORAL_TABLET | Freq: Two times a day (BID) | ORAL | Status: DC

## 2014-03-25 NOTE — ED Provider Notes (Signed)
CSN: [id number]     Arrival date & time 03/25/14  1721 History   First MD Initiated Contact with Patient 03/25/14 1905     Chief Complaint  Patient presents with  . Chest Pain     (Consider location/radiation/quality/duration/timing/severity/associated sxs/prior Treatment) The history is provided by the patient.   History of present illness: 58 year old female who presents with chief complaint of chest pain. Onset of symptoms was yesterday. Patient's pain began after she was in the shower, she felt lightheaded, and reports that she "passed out". Patient denies chest pain prior to possible syncopal episode. She believes she hit her chest on the door of the shower. Pain is located on the right side of the chest. It is constant and has been constant since she hit her chest. It is described as sharp. It is nonradiating. She denies any nausea, vomiting, diaphoresis, or shortness of breath. No recent illness. She also reports right ankle pain since falling in the shower. Pain is located in the anterior ankle, constant, exacerbated by bearing weight, described as aching, mild to moderate severity.  Past Medical History  Diagnosis Date  . Chest pain     a. s/p reported MI in 1997;  b. 09/2012 Cath: nl cors, nl LV ([place]);  c. 05/2012 Non-ischemic myoview.  . Hypertension   . Arthritis   . Problem with literacy   . Shortness of breath   . Anxiety   . Bronchitis   . Chronic headaches   . Obesity   . COPD (chronic obstructive pulmonary disease)   . Chronic diastolic CHF (congestive heart failure)     a. 12/2011 Echo: nl ef, Gr 1 DD;  b. 11/2012 Echo: EF 45-50%, no significant valvular abnormalities.  Marland Kitchen GERD (gastroesophageal reflux disease)   . Myocardial infarction 1997    self reported  . Hypothyroidism   . Asthma   . Chronic chest pain    Past Surgical History  Procedure Laterality Date  . Ectopic pregnancy surgery      right  . Nm myoview ltd  June 2013    no reversible ischemia  .  Cardiac catheterization  October 2012    No coronary artery disease  . Cardiac catheterization  2012    normal coronary arteries  . Nm myoview ltd  2012, 2013    normal  . 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 Knee arthroscopy  08/13/13    right   Family History  Problem Relation Age of Onset  . Cancer Mother 16    lung ca  . Diabetes Mother   . Anxiety disorder Sister   . Cancer Brother 60    lung  . Colon cancer Neg Hx   . Pancreatic cancer Neg Hx   . Rectal cancer Neg Hx   . Stomach cancer Neg Hx    History  Substance Use Topics  . Smoking status: Former Smoker -- 1.00 packs/day for 3 years    Quit date: 12/03/2010  . Smokeless tobacco: Never Used  . Alcohol Use: No   OB History   Grav Para Term Preterm Abortions TAB SAB Ect Mult Living                 Review of Systems  Constitutional: Negative for fever and chills.  HENT: Negative for congestion.   Eyes: Negative for pain.  Respiratory: Negative for shortness of breath.   Cardiovascular: Positive for chest pain.  Gastrointestinal: Negative for nausea, vomiting, abdominal pain, diarrhea and constipation.  Genitourinary: Negative for dysuria.  Musculoskeletal: Positive for arthralgias (right ankle). Negative for back pain.  Skin: Negative for rash and wound.  Neurological: Positive for syncope. Negative for headaches.  All other systems reviewed and are negative.     Allergies  Darvocet; Ibuprofen; Tramadol; Tylenol; Celebrex; Naproxen; and Penicillins  Home Medications   Current Outpatient Rx  Name  Route  Sig  Dispense  Refill  . aspirin EC 81 MG tablet   Oral   Take 81 mg by mouth daily.         Marland Kitchen atorvastatin (LIPITOR) 40 MG tablet   Oral   Take 40 mg by mouth daily.         . beclomethasone (QVAR) 80 MCG/ACT inhaler   Inhalation   Inhale 2 puffs into the lungs 2 (two) times daily as needed (shortness of breath/ wheezing).          .  butalbital-acetaminophen-caffeine (FIORICET, ESGIC) 50-325-40 MG per tablet   Oral   Take 1 tablet by mouth every 6 (six) hours as needed for headache.         . diclofenac (CATAFLAM) 50 MG tablet   Oral   Take 50 mg by mouth 2 (two) times daily.         Marland Kitchen levothyroxine (SYNTHROID, LEVOTHROID) 75 MCG tablet   Oral   Take 75 mcg by mouth daily before breakfast.         . metoprolol succinate (TOPROL-XL) 25 MG 24 hr tablet   Oral   Take 37.5 mg by mouth daily.         Marland Kitchen PARoxetine (PAXIL) 20 MG tablet   Oral   Take 1 tablet (20 mg total) by mouth daily.   90 tablet   1    BP 121/69  Pulse 82  Temp(Src) 97.8 F (36.6 C) (Oral)  Resp 15  Ht 5\' 8"  (1.727 m)  Wt 218 lb (98.884 kg)  BMI 33.15 kg/m2  SpO2 97% Physical Exam  Nursing note and vitals reviewed. Constitutional: She is oriented to person, place, and time. She appears well-developed and well-nourished. No distress.  HENT:  Head: Normocephalic and atraumatic.  Eyes: Conjunctivae are normal.  Neck: Neck supple.  Cardiovascular: Normal rate, regular rhythm, normal heart sounds and intact distal pulses.   Pulmonary/Chest: Effort normal and breath sounds normal. She has no wheezes. She has no rales. She exhibits tenderness (Pinpoint, right mid-chest, reproduces described pain).  Abdominal: Soft. She exhibits no distension. There is no tenderness.  Musculoskeletal: Normal range of motion.  Neurological: She is alert and oriented to person, place, and time.  Skin: Skin is warm and dry.    ED Course  Procedures (including critical care time) Labs Review Labs Reviewed  BASIC METABOLIC PANEL - Abnormal; Notable for the following:    GFR calc non Af Amer 76 (*)    GFR calc Af Amer 88 (*)    All other components within normal limits  CBC  I-STAT TROPOININ, ED   Imaging Review Dg Chest 2 View  03/25/2014   CLINICAL DATA:  Right-sided chest pain for 1 day. History of congestive heart failure, COPD and  myocardial infarction.  EXAM: CHEST  2 VIEW  COMPARISON:  02/21/2014 and 02/08/2014.  FINDINGS: The heart size and mediastinal contours are stable. The lungs are clear. There is no pleural effusion or pneumothorax. Degenerative changes of the spine are stable.  IMPRESSION: Stable chest.  No acute cardiopulmonary process.   Electronically Signed   By: Camie Patience M.D.   On: 03/25/2014 18:41   Dg Ankle Complete Right  03/25/2014   CLINICAL DATA:  Lateral ankle pain after injury 2 days ago.  EXAM: RIGHT ANKLE - COMPLETE 3+ VIEW  COMPARISON:  01/06/2014.  FINDINGS: The mineralization and alignment are normal. There is no evidence of acute fracture or dislocation. Tibiotalar degenerative changes are noted. There is possible mild lateral soft tissue swelling.  IMPRESSION: No acute osseous findings.  Possible lateral soft tissue swelling.   Electronically Signed   By: Camie Patience M.D.   On: 03/25/2014 18:42     EKG Interpretation None      MDM   Final diagnoses:  None    58 year old female with history of coronary artery disease, hypertension, COPD who presents for a possible episode of syncope with a fall and right-sided chest pain after falling yesterday. No chest pain prior to event. Patient with point tenderness over the right side of the chest. Normal vital signs, in no acute distress.   Date: 03/25/2014  Rate: 82  Rhythm: normal sinus rhythm  QRS Axis: normal  Intervals: normal  ST/T Wave abnormalities: normal  Conduction Disutrbances:none  Narrative Interpretation: Normal EKG  Old EKG Reviewed: unchanged  Normal EKG. No arrhythmia observed in the emergency department. On further review of records, patient has had multiple prior visits with similar chief complaint of a fall in the shower and right-sided chest pain. Patient also noted to have had a cardiac catheterization that was negative in June of 2013. With negative cardiac cath, reproducible chest pain, multiple presentations for  similar episodes, and patient appearing well in the emergency department feel appropriate for delta troponin with outpatient followup for further workup of possible syncope  Troponin negative x2.  Remainder of labs unremarkable. Patient appropriate for discharge home with PCP followup.  Renaldo Reel, MD 03/26/14 [phone]

## 2014-03-25 NOTE — ED Notes (Signed)
She c/o sharp stabbing cp since last night along with sob. Also she states her R foot and ankle are hurting and would like to have that checked too. States she fell in the bathroom yesterday and hurt her ankle.

## 2014-03-25 NOTE — Discharge Instructions (Signed)

## 2014-03-26 NOTE — ED Provider Notes (Signed)
Medical screening examination/treatment/procedure(s) were conducted as a shared visit with non-physician practitioner(s) or resident  and myself.  I personally evaluated the patient during the encounter and agree with the findings and plan unless otherwise indicated.    I have personally reviewed any xrays and/ or EKG's with the provider and I agree with interpretation.   Atypical CP, reproduceable, recent unremarkable cath. No exertional component or diaphoresis.  Exam focal severe right chest wall pain, RRR, lungs clear, mild right lateral ankle tenderness with full rom.  EKG/ troponin unremarkable.  Pain improved in the ED.  Fup outpt discussed.   EKG Interpretation   Date/Time:  Friday March 25 2014 17:29:38 EDT Ventricular Rate:  82 PR Interval:  140 QRS Duration: 76 QT Interval:  390 QTC Calculation: 455 R Axis:   34 Text Interpretation:  Normal sinus rhythm Normal ECG Confirmed by Teiana Hajduk   MD, Haset O[place]aca ([id number]) on 03/25/2014 7:45:21 PM       Atypical chest pain  Mariea Clonts, MD 03/26/14 0101

## 2014-03-28 ENCOUNTER — Telehealth: Payer: Self-pay | Admitting: Family Medicine

## 2014-03-28 NOTE — Telephone Encounter (Signed)
Do we need to mail her an appt card?  Jazmin Hartsell,CMA

## 2014-03-28 NOTE — Telephone Encounter (Signed)
Pt 's phone is not accepting incoming call.   Appt 04/20 @ 3:00.  Raliegh Ip  [place] [id number]  [place]

## 2014-04-03 ENCOUNTER — Emergency Department (HOSPITAL_COMMUNITY)
Admission: EM | Admit: 2014-04-03 | Discharge: 2014-04-03 | Disposition: A | Discharge status: Home / Self Care | Payer: Medicaid Other | Attending: Emergency Medicine | Admitting: Emergency Medicine

## 2014-04-03 ENCOUNTER — Emergency Department (HOSPITAL_COMMUNITY): Payer: Medicaid Other

## 2014-04-03 ENCOUNTER — Encounter (HOSPITAL_COMMUNITY): Payer: Self-pay | Admitting: Emergency Medicine

## 2014-04-03 ENCOUNTER — Other Ambulatory Visit: Payer: Self-pay

## 2014-04-03 DIAGNOSIS — Z87891 Personal history of nicotine dependence: Secondary | ICD-10-CM | POA: Insufficient documentation

## 2014-04-03 DIAGNOSIS — S8990XA Unspecified injury of unspecified lower leg, initial encounter: Secondary | ICD-10-CM

## 2014-04-03 DIAGNOSIS — I252 Old myocardial infarction: Secondary | ICD-10-CM | POA: Insufficient documentation

## 2014-04-03 DIAGNOSIS — Z880 Allergy status to penicillin: Secondary | ICD-10-CM | POA: Insufficient documentation

## 2014-04-03 DIAGNOSIS — IMO0002 Reserved for concepts with insufficient information to code with codable children: Secondary | ICD-10-CM | POA: Insufficient documentation

## 2014-04-03 DIAGNOSIS — Y939 Activity, unspecified: Secondary | ICD-10-CM | POA: Insufficient documentation

## 2014-04-03 DIAGNOSIS — M129 Arthropathy, unspecified: Secondary | ICD-10-CM | POA: Insufficient documentation

## 2014-04-03 DIAGNOSIS — G8929 Other chronic pain: Secondary | ICD-10-CM | POA: Insufficient documentation

## 2014-04-03 DIAGNOSIS — I5032 Chronic diastolic (congestive) heart failure: Secondary | ICD-10-CM | POA: Insufficient documentation

## 2014-04-03 DIAGNOSIS — F411 Generalized anxiety disorder: Secondary | ICD-10-CM | POA: Insufficient documentation

## 2014-04-03 DIAGNOSIS — Z8719 Personal history of other diseases of the digestive system: Secondary | ICD-10-CM | POA: Insufficient documentation

## 2014-04-03 DIAGNOSIS — Z7982 Long term (current) use of aspirin: Secondary | ICD-10-CM | POA: Insufficient documentation

## 2014-04-03 DIAGNOSIS — J45901 Unspecified asthma with (acute) exacerbation: Secondary | ICD-10-CM | POA: Insufficient documentation

## 2014-04-03 DIAGNOSIS — W010XXA Fall on same level from slipping, tripping and stumbling without subsequent striking against object, initial encounter: Secondary | ICD-10-CM | POA: Insufficient documentation

## 2014-04-03 DIAGNOSIS — E669 Obesity, unspecified: Secondary | ICD-10-CM | POA: Insufficient documentation

## 2014-04-03 DIAGNOSIS — N390 Urinary tract infection, site not specified: Secondary | ICD-10-CM

## 2014-04-03 DIAGNOSIS — I10 Essential (primary) hypertension: Secondary | ICD-10-CM | POA: Insufficient documentation

## 2014-04-03 DIAGNOSIS — S99929A Unspecified injury of unspecified foot, initial encounter: Principal | ICD-10-CM

## 2014-04-03 DIAGNOSIS — S99919A Unspecified injury of unspecified ankle, initial encounter: Principal | ICD-10-CM

## 2014-04-03 DIAGNOSIS — Y929 Unspecified place or not applicable: Secondary | ICD-10-CM | POA: Insufficient documentation

## 2014-04-03 DIAGNOSIS — E039 Hypothyroidism, unspecified: Secondary | ICD-10-CM | POA: Insufficient documentation

## 2014-04-03 DIAGNOSIS — Z9889 Other specified postprocedural states: Secondary | ICD-10-CM | POA: Insufficient documentation

## 2014-04-03 DIAGNOSIS — J441 Chronic obstructive pulmonary disease with (acute) exacerbation: Secondary | ICD-10-CM | POA: Insufficient documentation

## 2014-04-03 DIAGNOSIS — Z79899 Other long term (current) drug therapy: Secondary | ICD-10-CM | POA: Insufficient documentation

## 2014-04-03 LAB — BASIC METABOLIC PANEL
BUN: 9 mg/dL (ref 6–23)
CO2: 21 meq/L (ref 19–32)
CREATININE: 0.74 mg/dL (ref 0.50–1.10)
Calcium: 8.9 mg/dL (ref 8.4–10.5)
Chloride: 102 mEq/L (ref 96–112)
GFR calc Af Amer: >90 mL/min (ref >90)
GFR calc non Af Amer: >90 mL/min (ref >90)
Glucose, Bld: 82 mg/dL (ref 70–99)
POTASSIUM: 4.7 meq/L (ref 3.7–5.3)
Sodium: 137 mEq/L (ref 137–147)

## 2014-04-03 LAB — URINALYSIS, ROUTINE W REFLEX MICROSCOPIC
Bilirubin Urine: NEGATIVE
Glucose, UA: NEGATIVE mg/dL
HGB URINE DIPSTICK: NEGATIVE
Ketones, ur: NEGATIVE mg/dL
NITRITE: POSITIVE — AB
PROTEIN: NEGATIVE mg/dL
SPECIFIC GRAVITY, URINE: 1.009 (ref 1.005–1.030)
Urobilinogen, UA: 1 mg/dL (ref 0.0–1.0)
pH: 6.5 (ref 5.0–8.0)

## 2014-04-03 LAB — CBC
HEMATOCRIT: 40.8 % (ref 36.0–46.0)
Hemoglobin: 13.6 g/dL (ref 12.0–15.0)
MCH: 28.2 pg (ref 26.0–34.0)
MCHC: 33.3 g/dL (ref 30.0–36.0)
MCV: 84.5 fL (ref 78.0–100.0)
PLATELETS: 278 10*3/uL (ref 150–400)
RBC: 4.83 MIL/uL (ref 3.87–5.11)
RDW: 14.2 % (ref 11.5–15.5)
WBC: 8.2 10*3/uL (ref 4.0–10.5)

## 2014-04-03 LAB — HEPATIC FUNCTION PANEL
ALBUMIN: 3.9 g/dL (ref 3.5–5.2)
ALT: 10 U/L (ref 0–35)
AST: 26 U/L (ref 0–37)
Alkaline Phosphatase: 103 U/L (ref 39–117)
Bilirubin, Direct: <0.2 mg/dL (ref 0.0–0.3)
TOTAL PROTEIN: 7.2 g/dL (ref 6.0–8.3)
Total Bilirubin: 0.5 mg/dL (ref 0.3–1.2)

## 2014-04-03 LAB — URINE MICROSCOPIC-ADD ON

## 2014-04-03 LAB — LIPASE, BLOOD: LIPASE: 30 U/L (ref 11–59)

## 2014-04-03 LAB — I-STAT TROPONIN, ED: Troponin i, poc: 0 ng/mL (ref 0.00–0.08)

## 2014-04-03 LAB — TROPONIN I: Troponin I: <0.3 ng/mL (ref <0.30)

## 2014-04-03 MED ORDER — OPTICHAMBER ADVANTAGE MISC
1.0000 | Freq: Once | Status: AC
  Administered 2014-04-03: 1
  Filled 2014-04-03: qty 1

## 2014-04-03 MED ORDER — SODIUM CHLORIDE 0.9 % IV SOLN
INTRAVENOUS | Status: DC
Start: 2014-04-03 — End: 2014-04-03
  Administered 2014-04-03: 17:00:00 via INTRAVENOUS

## 2014-04-03 MED ORDER — ALBUTEROL SULFATE HFA 108 (90 BASE) MCG/ACT IN AERS
2.0000 | INHALATION_SPRAY | RESPIRATORY_TRACT | Status: DC | PRN
  Administered 2014-04-03: 2 via RESPIRATORY_TRACT
  Filled 2014-04-03: qty 6.7

## 2014-04-03 MED ORDER — ALBUTEROL SULFATE (2.5 MG/3ML) 0.083% IN NEBU
5.0000 mg | INHALATION_SOLUTION | Freq: Once | RESPIRATORY_TRACT | Status: AC
  Administered 2014-04-03: 5 mg via RESPIRATORY_TRACT
  Filled 2014-04-03: qty 6

## 2014-04-03 MED ORDER — ACETAMINOPHEN 500 MG PO TABS
1000.0000 mg | ORAL_TABLET | Freq: Once | ORAL | Status: AC
  Administered 2014-04-03: 1000 mg via ORAL
  Filled 2014-04-03: qty 2

## 2014-04-03 MED ORDER — ONDANSETRON HCL 4 MG/2ML IJ SOLN: 4.0000 mg | Freq: Once | INTRAMUSCULAR | Status: DC

## 2014-04-03 MED ORDER — CIPROFLOXACIN HCL 500 MG PO TABS: 500.0000 mg | ORAL_TABLET | Freq: Two times a day (BID) | ORAL | Status: DC

## 2014-04-03 MED ORDER — GI COCKTAIL ~~LOC~~
30.0000 mL | Freq: Once | ORAL | Status: AC
  Administered 2014-04-03: 30 mL via ORAL
  Filled 2014-04-03: qty 30

## 2014-04-03 MED ORDER — HYDROMORPHONE HCL PF 1 MG/ML IJ SOLN
1.0000 mg | Freq: Once | INTRAMUSCULAR | Status: AC
  Administered 2014-04-03: 1 mg via INTRAVENOUS
  Filled 2014-04-03: qty 1

## 2014-04-03 MED ORDER — CIPROFLOXACIN IN D5W 400 MG/200ML IV SOLN
400.0000 mg | Freq: Once | INTRAVENOUS | Status: AC
  Administered 2014-04-03: 400 mg via INTRAVENOUS
  Filled 2014-04-03: qty 200

## 2014-04-03 MED ORDER — HYDROMORPHONE HCL 4 MG PO TABS: 4.0000 mg | ORAL_TABLET | Freq: Four times a day (QID) | ORAL | Status: DC | PRN

## 2014-04-03 NOTE — ED Notes (Signed)
Pt is visitor of pt in [place].  States that she fell on Friday, hurt her rt knee and would like to have that checked.  Also c/o gen abd pain that started this morning.  No vomiting.  Pt dry heaving in triage.  Pt also c/o SOB although pt is in NAD and is speaking in full sentences.

## 2014-04-03 NOTE — ED Provider Notes (Signed)
CSN: [id number]     Arrival date & time 04/03/14  1333 History   First MD Initiated Contact with Patient 04/03/14 1411     Chief Complaint  Patient presents with  . Knee Pain  . Abdominal Pain  . Shortness of Breath     (Consider location/radiation/quality/duration/timing/severity/associated sxs/prior Treatment) HPI Complaint of right knee pain since she slipped and fell in her shower 2 days ago landing on her right anterior knee. Pain is anterior, worse with weightbearing improved with rest She also complains of abdominal epigastric pain onset this morning which is worse with lying supine and feels as if affect from her stomach washes back into her throat. Patient also complains of shortness of breath which is typical of COPD she had in the past. She has not used her inhaler as she has run out. Past Medical History  Diagnosis Date  . Chest pain     a. s/p reported MI in 1997;  b. 09/2012 Cath: nl cors, nl LV ([place]);  c. 05/2012 Non-ischemic myoview.  . Hypertension   . Arthritis   . Problem with literacy   . Shortness of breath   . Anxiety   . Bronchitis   . Chronic headaches   . Obesity   . COPD (chronic obstructive pulmonary disease)   . Chronic diastolic CHF (congestive heart failure)     a. 12/2011 Echo: nl ef, Gr 1 DD;  b. 11/2012 Echo: EF 45-50%, no significant valvular abnormalities.  Marland Kitchen GERD (gastroesophageal reflux disease)   . Myocardial infarction 1997    self reported  . Hypothyroidism   . Asthma   . Chronic chest pain    Past Surgical History  Procedure Laterality Date  . Ectopic pregnancy surgery      right  . Nm myoview ltd  June 2013    no reversible ischemia  . Cardiac catheterization  October 2012    No coronary artery disease  . Cardiac catheterization  2012    normal coronary arteries  . Nm myoview ltd  2012, 2013    normal  . 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 Knee arthroscopy  08/13/13    right   Family  History  Problem Relation Age of Onset  . Cancer Mother 27    lung ca  . Diabetes Mother   . Anxiety disorder Sister   . Cancer Brother 60    lung  . Colon cancer Neg Hx   . Pancreatic cancer Neg Hx   . Rectal cancer Neg Hx   . Stomach cancer Neg Hx    History  Substance Use Topics  . Smoking status: Former Smoker -- 1.00 packs/day for 3 years    Quit date: 12/03/2010  . Smokeless tobacco: Never Used  . Alcohol Use: No   no illicit drug OB History   Grav Para Term Preterm Abortions TAB SAB Ect Mult Living                 Review of Systems  Respiratory: Positive for shortness of breath.   Gastrointestinal: Positive for abdominal pain.  Musculoskeletal: Positive for arthralgias.       Right knee pain, otherwise no arthralgias.  All other systems reviewed and are negative.     Allergies  Darvocet; Ibuprofen; Tramadol; Tylenol; Celebrex; Naproxen; and Penicillins  Home Medications   Prior to Admission medications   Medication Sig Start Date End Date Taking? Authorizing  Provider  aspirin EC 81 MG tablet Take 81 mg by mouth daily.   Yes Historical Provider, MD  atorvastatin (LIPITOR) 40 MG tablet Take 40 mg by mouth daily.   Yes Historical Provider, MD  beclomethasone (QVAR) 80 MCG/ACT inhaler Inhale 2 puffs into the lungs 2 (two) times daily as needed (shortness of breath/ wheezing).    Yes Historical Provider, MD  butalbital-acetaminophen-caffeine (FIORICET, ESGIC) 50-325-40 MG per tablet Take 1 tablet by mouth every 6 (six) hours as needed for headache.   Yes Historical Provider, MD  diclofenac (CATAFLAM) 50 MG tablet Take 1 tablet (50 mg total) by mouth 2 (two) times daily. 03/25/14  Yes Renaldo Reel, MD  levothyroxine (SYNTHROID, LEVOTHROID) 75 MCG tablet Take 75 mcg by mouth daily before breakfast.   Yes Historical Provider, MD  metoprolol succinate (TOPROL-XL) 25 MG 24 hr tablet Take 37.5 mg by mouth daily.   Yes Historical Provider, MD  PARoxetine (PAXIL) 20 MG  tablet Take 1 tablet (20 mg total) by mouth daily. 03/15/14  Yes Andrena Mews, MD   BP 123/73  Pulse 92  Temp(Src) 98.1 F (36.7 C) (Oral)  Resp 18  SpO2 100% Physical Exam  Nursing note and vitals reviewed. Constitutional: She appears well-developed and well-nourished.  HENT:  Head: Normocephalic and atraumatic.  Eyes: Conjunctivae are normal. Pupils are equal, round, and reactive to light.  Neck: Neck supple. No tracheal deviation present. No thyromegaly present.  Cardiovascular: Normal rate and regular rhythm.   No murmur heard. Pulmonary/Chest: Effort normal and breath sounds normal.  Abdominal: Soft. Bowel sounds are normal. She exhibits no distension. There is no tenderness.  Morbidly obese  Musculoskeletal: Normal range of motion. She exhibits tenderness. She exhibits no edema.  Right lower extremity skin intact, no ecchymosis, minimally tender over anterior knee. No ligamentous laxity.. DP pulse 2+. All other extremities a contusion abrasion or tenderness neurovascularly intact  Neurological: She is alert. Coordination normal.  Skin: Skin is warm and dry. No rash noted.  Psychiatric: She has a normal mood and affect.    ED Course  Procedures (including critical care time) Labs Review Labs Reviewed  [place], ED    Imaging Review No results found.   EKG Interpretation None      Date: 04/03/2014  Rate: 70  Rhythm: normal sinus rhythm  QRS Axis: normal  Intervals: normal  ST/T Wave abnormalities: normal  Conduction Disutrbances: none  Narrative Interpretation: unremarkable Unchanged from 03/25/2014 interpreted by me  Results for orders placed during the hospital encounter of 04/03/14  CBC      Result Value Ref Range   WBC 8.2  4.0 - 10.5 K/uL   RBC 4.83  3.87 - 5.11 MIL/uL   Hemoglobin 13.6  12.0 - 15.0 g/dL   HCT 40.8  36.0 - 46.0 %   MCV 84.5  78.0 - 100.0 fL   MCH 28.2  26.0 - 34.0 pg   MCHC 33.3  30.0 - 36.0  g/dL   RDW 14.2  11.5 - 15.5 %   Platelets 278  150 - 400 K/uL  BASIC METABOLIC PANEL      Result Value Ref Range   Sodium 137  137 - 147 mEq/L   Potassium 4.7  3.7 - 5.3 mEq/L   Chloride 102  96 - 112 mEq/L   CO2 21  19 - 32 mEq/L   Glucose, Bld 82  70 - 99 mg/dL   BUN 9  6 -  23 mg/dL   Creatinine, Ser 0.74  0.50 - 1.10 mg/dL   Calcium 8.9  8.4 - 10.5 mg/dL   GFR calc non Af Amer >90  >90 mL/min   GFR calc Af Amer >90  >90 mL/min  URINALYSIS, ROUTINE W REFLEX MICROSCOPIC      Result Value Ref Range   Color, Urine YELLOW  YELLOW   APPearance CLOUDY (*) CLEAR   Specific Gravity, Urine 1.009  1.005 - 1.030   pH 6.5  5.0 - 8.0   Glucose, UA NEGATIVE  NEGATIVE mg/dL   Hgb urine dipstick NEGATIVE  NEGATIVE   Bilirubin Urine NEGATIVE  NEGATIVE   Ketones, ur NEGATIVE  NEGATIVE mg/dL   Protein, ur NEGATIVE  NEGATIVE mg/dL   Urobilinogen, UA 1.0  0.0 - 1.0 mg/dL   Nitrite POSITIVE (*) NEGATIVE   Leukocytes, UA MODERATE (*) NEGATIVE  HEPATIC FUNCTION PANEL      Result Value Ref Range   Total Protein 7.2  6.0 - 8.3 g/dL   Albumin 3.9  3.5 - 5.2 g/dL   AST 26  0 - 37 U/L   ALT 10  0 - 35 U/L   Alkaline Phosphatase 103  39 - 117 U/L   Total Bilirubin 0.5  0.3 - 1.2 mg/dL   Bilirubin, Direct <0.2  0.0 - 0.3 mg/dL   Indirect Bilirubin NOT CALCULATED  0.3 - 0.9 mg/dL  LIPASE, BLOOD      Result Value Ref Range   Lipase 30  11 - 59 U/L  URINE MICROSCOPIC-ADD ON      Result Value Ref Range   Squamous Epithelial / LPF RARE  RARE   WBC, UA 11-20  <3 WBC/hpf   Bacteria, UA MANY (*) RARE  I-STAT TROPOININ, ED      Result Value Ref Range   Troponin i, poc 0.00  0.00 - 0.08 ng/mL   Comment 3            Dg Chest 2 View  03/25/2014   CLINICAL DATA:  Right-sided chest pain for 1 day. History of congestive heart failure, COPD and myocardial infarction.  EXAM: CHEST  2 VIEW  COMPARISON:  02/21/2014 and 02/08/2014.  FINDINGS: The heart size and mediastinal contours are stable. The lungs are  clear. There is no pleural effusion or pneumothorax. Degenerative changes of the spine are stable.  IMPRESSION: Stable chest.  No acute cardiopulmonary process.   Electronically Signed   By: Camie Patience M.D.   On: 03/25/2014 18:41   Dg Ankle Complete Right  03/25/2014   CLINICAL DATA:  Lateral ankle pain after injury 2 days ago.  EXAM: RIGHT ANKLE - COMPLETE 3+ VIEW  COMPARISON:  01/06/2014.  FINDINGS: The mineralization and alignment are normal. There is no evidence of acute fracture or dislocation. Tibiotalar degenerative changes are noted. There is possible mild lateral soft tissue swelling.  IMPRESSION: No acute osseous findings.  Possible lateral soft tissue swelling.   Electronically Signed   By: Camie Patience M.D.   On: 03/25/2014 18:42   Dg Knee Complete 4 Views Right  04/03/2014   CLINICAL DATA:  Fall 2 days ago.  Anterior right knee pain.  EXAM: RIGHT KNEE - COMPLETE 4+ VIEW  COMPARISON:  None.  FINDINGS: Small knee joint effusion is noted. No evidence of fracture or dislocation. Mild degenerative spurring is seen involving the medial tibial plateau, without evidence of joint space narrowing. No other significant bone abnormality identified.  IMPRESSION: Small knee joint effusion.  No evidence of fracture.  Mild medial compartment degenerative spurring.   Electronically Signed   By: Earle Gell M.D.   On: 04/03/2014 14:52   Dg Knee Complete 4 Views Right  03/08/2014   CLINICAL DATA:  Fall.  Pain and bruising have patella.  EXAM: RIGHT KNEE - COMPLETE 4+ VIEW  COMPARISON:  DG KNEE COMPLETE 4 VIEWS*R* dated 03/05/2014  FINDINGS: There is no evidence of acute fracture or dislocation. No knee joint effusion is identified. Mild medial compartment marginal spurring is unchanged. No soft tissue abnormality is seen.  IMPRESSION: No acute osseous abnormality identified.   Electronically Signed   By: Logan Bores   On: 03/08/2014 18:48   Dg Knee Complete 4 Views Right  03/05/2014   CLINICAL DATA:  Fall 1  week ago, anterior knee pain  EXAM: RIGHT KNEE - COMPLETE 4+ VIEW  COMPARISON:  02/03/2014  FINDINGS: No fracture or dislocation is seen.  Mild degenerative changes with sharpening of the tibial spines and medial compartment osteophytosis.  No definite suprapatellar joint effusion.  IMPRESSION: No fracture or dislocation is seen.   Electronically Signed   By: Julian Hy M.D.   On: 03/05/2014 20:13    Knee x-ray viewed by me. Patient's breathing is improved after treatment with albuterol nebulizer. She appears to be resting comfortably.  pain is improved after treatment with GI cocktail and Tylenol MDM   Final diagnoses:  None  pt signed out to Dr. Rogene Houston at 340 pm. Strongly doubt acute coronary syndrome with atypical symptoms. Symptoms more likely consistent with GERD She will be given an albuterol inhaler with spacer to go in anticipation of discharge Diagnosis #1 epigastric pain #2 exacerbation of COPD #3 contusion of right knee      Orlie Dakin, MD 04/03/14 1553

## 2014-04-03 NOTE — ED Notes (Signed)
MD at bedside. 

## 2014-04-03 NOTE — ED Notes (Signed)
Respiratory called

## 2014-04-03 NOTE — ED Notes (Signed)
Pt aware of the need for a urine sample. 

## 2014-04-03 NOTE — ED Notes (Signed)
Provided pt with sandwich and coke per request

## 2014-04-03 NOTE — Discharge Instructions (Signed)
Again a bike as directed for urinary tract infection to be twice a day for the next 7 days. Keep knee immobilizer in place until seen by orthopedics. Take pain medicine as needed.

## 2014-04-05 LAB — URINE CULTURE
Colony Count: >=100000
Special Requests: NORMAL

## 2014-04-06 NOTE — Telephone Encounter (Signed)
Post ED Visit - Positive Culture Follow-up  Culture report reviewed by antimicrobial stewardship pharmacist: []  Wes [place], Pharm.D., BCPS []  Heide Guile, Pharm.D., BCPS []  Alycia Rossetti, Pharm.D., BCPS []  [place], Pharm.D., BCPS, AAHIVP []  Legrand [place], Pharm.D., BCPS, AAHIVP [x]  Juliene Pina, Pharm.D.  Positive urine culture Treated with Cipro, organism sensitive to the same and no further patient follow-up is required at this time.  [place] 04/06/2014, 11:18 AM

## 2014-04-17 ENCOUNTER — Encounter (HOSPITAL_COMMUNITY): Payer: Self-pay | Admitting: Emergency Medicine

## 2014-04-17 ENCOUNTER — Emergency Department (HOSPITAL_COMMUNITY): Payer: Medicaid Other

## 2014-04-17 ENCOUNTER — Emergency Department (HOSPITAL_COMMUNITY)
Admission: EM | Admit: 2014-04-17 | Discharge: 2014-04-17 | Disposition: A | Discharge status: Home / Self Care | Payer: Medicaid Other | Attending: Emergency Medicine | Admitting: Emergency Medicine

## 2014-04-17 DIAGNOSIS — R296 Repeated falls: Secondary | ICD-10-CM | POA: Insufficient documentation

## 2014-04-17 DIAGNOSIS — Z9889 Other specified postprocedural states: Secondary | ICD-10-CM | POA: Insufficient documentation

## 2014-04-17 DIAGNOSIS — E039 Hypothyroidism, unspecified: Secondary | ICD-10-CM | POA: Insufficient documentation

## 2014-04-17 DIAGNOSIS — I10 Essential (primary) hypertension: Secondary | ICD-10-CM | POA: Insufficient documentation

## 2014-04-17 DIAGNOSIS — Y92009 Unspecified place in unspecified non-institutional (private) residence as the place of occurrence of the external cause: Secondary | ICD-10-CM | POA: Insufficient documentation

## 2014-04-17 DIAGNOSIS — Z7982 Long term (current) use of aspirin: Secondary | ICD-10-CM | POA: Insufficient documentation

## 2014-04-17 DIAGNOSIS — G8929 Other chronic pain: Secondary | ICD-10-CM | POA: Insufficient documentation

## 2014-04-17 DIAGNOSIS — J449 Chronic obstructive pulmonary disease, unspecified: Secondary | ICD-10-CM | POA: Insufficient documentation

## 2014-04-17 DIAGNOSIS — Y939 Activity, unspecified: Secondary | ICD-10-CM | POA: Insufficient documentation

## 2014-04-17 DIAGNOSIS — S4980XA Other specified injuries of shoulder and upper arm, unspecified arm, initial encounter: Secondary | ICD-10-CM | POA: Insufficient documentation

## 2014-04-17 DIAGNOSIS — I252 Old myocardial infarction: Secondary | ICD-10-CM | POA: Insufficient documentation

## 2014-04-17 DIAGNOSIS — Z880 Allergy status to penicillin: Secondary | ICD-10-CM | POA: Insufficient documentation

## 2014-04-17 DIAGNOSIS — Z87891 Personal history of nicotine dependence: Secondary | ICD-10-CM | POA: Insufficient documentation

## 2014-04-17 DIAGNOSIS — S46909A Unspecified injury of unspecified muscle, fascia and tendon at shoulder and upper arm level, unspecified arm, initial encounter: Secondary | ICD-10-CM | POA: Insufficient documentation

## 2014-04-17 DIAGNOSIS — J4489 Other specified chronic obstructive pulmonary disease: Secondary | ICD-10-CM | POA: Insufficient documentation

## 2014-04-17 DIAGNOSIS — IMO0002 Reserved for concepts with insufficient information to code with codable children: Secondary | ICD-10-CM | POA: Insufficient documentation

## 2014-04-17 DIAGNOSIS — Z79899 Other long term (current) drug therapy: Secondary | ICD-10-CM | POA: Insufficient documentation

## 2014-04-17 DIAGNOSIS — Z8719 Personal history of other diseases of the digestive system: Secondary | ICD-10-CM | POA: Insufficient documentation

## 2014-04-17 DIAGNOSIS — Z791 Long term (current) use of non-steroidal anti-inflammatories (NSAID): Secondary | ICD-10-CM | POA: Insufficient documentation

## 2014-04-17 DIAGNOSIS — F411 Generalized anxiety disorder: Secondary | ICD-10-CM | POA: Insufficient documentation

## 2014-04-17 DIAGNOSIS — M129 Arthropathy, unspecified: Secondary | ICD-10-CM | POA: Insufficient documentation

## 2014-04-17 DIAGNOSIS — Z792 Long term (current) use of antibiotics: Secondary | ICD-10-CM | POA: Insufficient documentation

## 2014-04-17 DIAGNOSIS — E669 Obesity, unspecified: Secondary | ICD-10-CM | POA: Insufficient documentation

## 2014-04-17 DIAGNOSIS — S4990XA Unspecified injury of shoulder and upper arm, unspecified arm, initial encounter: Secondary | ICD-10-CM

## 2014-04-17 DIAGNOSIS — I5032 Chronic diastolic (congestive) heart failure: Secondary | ICD-10-CM | POA: Insufficient documentation

## 2014-04-17 DIAGNOSIS — W19XXXA Unspecified fall, initial encounter: Secondary | ICD-10-CM

## 2014-04-17 NOTE — ED Notes (Signed)
Reports she fell while getting out of the shower this morning, c/o left shoulder/arm pain. ROM intact, pulse present. No obvious deformity noted. States she would like to get her left knee checkd out because its been giving her troubles lately.

## 2014-04-17 NOTE — ED Provider Notes (Signed)
CSN: [id number]     Arrival date & time 04/17/14  1640 History   This chart was scribed for Montine Circle by Randa Evens, ED Scribe. This patient was seen in room TR09C/TR09C and the patient's care was started at 5:00 PM.  Chief Complaint  Patient presents with  . Fall   The history is provided by the patient. No language interpreter was used.   HPI Comments: [name] is a 58 y.o. female who presents to the Emergency Department complaining of a sudden fall onset 11 AM. She states that she is having associated left shoulder pain after the fall that is gradually worsening. She states she fell in her her bathroom due to water being on the floor that she could not see. She denies any other related symptoms. Pain is worsened with movement.  Past Medical History  Diagnosis Date  . Chest pain     a. s/p reported MI in 1997;  b. 09/2012 Cath: nl cors, nl LV ([place]);  c. 05/2012 Non-ischemic myoview.  . Hypertension   . Arthritis   . Problem with literacy   . Shortness of breath   . Anxiety   . Bronchitis   . Chronic headaches   . Obesity   . COPD (chronic obstructive pulmonary disease)   . Chronic diastolic CHF (congestive heart failure)     a. 12/2011 Echo: nl ef, Gr 1 DD;  b. 11/2012 Echo: EF 45-50%, no significant valvular abnormalities.  Marland Kitchen GERD (gastroesophageal reflux disease)   . Myocardial infarction 1997    self reported  . Hypothyroidism   . Asthma   . Chronic chest pain    Past Surgical History  Procedure Laterality Date  . Ectopic pregnancy surgery      right  . Nm myoview ltd  June 2013    no reversible ischemia  . Cardiac catheterization  October 2012    No coronary artery disease  . Cardiac catheterization  2012    normal coronary arteries  . Nm myoview ltd  2012, 2013    normal  . 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 Knee arthroscopy  08/13/13    right   Family History  Problem Relation Age of Onset  .  Cancer Mother 37    lung ca  . Diabetes Mother   . Anxiety disorder Sister   . Cancer Brother 60    lung  . Colon cancer Neg Hx   . Pancreatic cancer Neg Hx   . Rectal cancer Neg Hx   . Stomach cancer Neg Hx    History  Substance Use Topics  . Smoking status: Former Smoker -- 1.00 packs/day for 3 years    Quit date: 12/03/2010  . Smokeless tobacco: Never Used  . Alcohol Use: No   OB History   Grav Para Term Preterm Abortions TAB SAB Ect Mult Living                 Review of Systems  Constitutional: Negative for fever and chills.  Respiratory: Negative for shortness of breath.   Cardiovascular: Negative for chest pain.  Gastrointestinal: Negative for nausea, vomiting, diarrhea and constipation.  Genitourinary: Negative for dysuria.  Musculoskeletal:       Left shoulder pain      Allergies  Darvocet; Ibuprofen; Tramadol; Tylenol; Celebrex; Naproxen; and Penicillins  Home Medications   Prior to Admission medications   Medication Sig Start Date  End Date Taking? Authorizing Provider  aspirin EC 81 MG tablet Take 81 mg by mouth daily.    Historical Provider, MD  atorvastatin (LIPITOR) 40 MG tablet Take 40 mg by mouth daily.    Historical Provider, MD  beclomethasone (QVAR) 80 MCG/ACT inhaler Inhale 2 puffs into the lungs 2 (two) times daily as needed (shortness of breath/ wheezing).     Historical Provider, MD  butalbital-acetaminophen-caffeine (FIORICET, ESGIC) 50-325-40 MG per tablet Take 1 tablet by mouth every 6 (six) hours as needed for headache.    Historical Provider, MD  ciprofloxacin (CIPRO) 500 MG tablet Take 1 tablet (500 mg total) by mouth 2 (two) times daily. 04/03/14   Mervin Kung, MD  diclofenac (CATAFLAM) 50 MG tablet Take 1 tablet (50 mg total) by mouth 2 (two) times daily. 03/25/14   Renaldo Reel, MD  HYDROmorphone (DILAUDID) 4 MG tablet Take 1 tablet (4 mg total) by mouth every 6 (six) hours as needed for severe pain. 04/03/14   Mervin Kung, MD   levothyroxine (SYNTHROID, LEVOTHROID) 75 MCG tablet Take 75 mcg by mouth daily before breakfast.    Historical Provider, MD  metoprolol succinate (TOPROL-XL) 25 MG 24 hr tablet Take 37.5 mg by mouth daily.    Historical Provider, MD  PARoxetine (PAXIL) 20 MG tablet Take 1 tablet (20 mg total) by mouth daily. 03/15/14   Andrena Mews, MD   Triage Vitals: BP 137/73  Pulse 73  Temp(Src) 97.8 F (36.6 C) (Oral)  Resp 18  Wt 214 lb (97.07 kg)  SpO2 100%  Physical Exam  Nursing note and vitals reviewed. Constitutional: She is oriented to person, place, and time. She appears well-developed and well-nourished. No distress.  HENT:  Head: Normocephalic and atraumatic.  Eyes: EOM are normal.  Neck: Neck supple. No tracheal deviation present.  Cardiovascular: Normal rate.   Brisk capillary refill, intact distal pulses.   Pulmonary/Chest: Effort normal. No respiratory distress.  Musculoskeletal: Normal range of motion.  Left shoulder moderate tenderness to palpation over the ac joint, ROM and strength 5/5, no bony abnormality.   Neurological: She is alert and oriented to person, place, and time.  Sensation intact.  Skin: Skin is warm and dry.  Psychiatric: She has a normal mood and affect. Her behavior is normal.    ED Course  Procedures (including critical care time) DIAGNOSTIC STUDIES: Oxygen Saturation is 100% on RA, normal by my interpretation.    COORDINATION OF CARE: 5:05 PM Discussed receiving left shoulder X-ray with pt at bedside. Pt understand and agrees.    Labs Review Labs Reviewed - No data to display  Imaging Review Dg Shoulder Left  04/17/2014   CLINICAL DATA:  Pain in the proximal humerus  EXAM: LEFT SHOULDER - 2+ VIEW  COMPARISON:  None.  FINDINGS: There is no fracture or dislocation. There are mild degenerative changes of the acromioclavicular joint.  IMPRESSION: No acute osseous injury of the left humerus.   Electronically Signed   By: Kathreen Devoid   On: 04/17/2014  18:12     EKG Interpretation None      MDM   Final diagnoses:  Fall  Shoulder injury    She mechanical fall. No evidence of acute injury on plain film. Will give the patient a sling, and recommend orthopedic followup. Patient has been seen 24 times in the past 6 months. Because of this urged outpatient followup with PCP/orthopedics. I do not feel that narcotic pain medicine is warranted in this situation, therefore  I prescribed ice and heat, and OTC pain relievers.  I personally performed the services described in this documentation, which was scribed in my presence. The recorded information has been reviewed and is accurate.     Montine Circle, PA-C 04/17/14 Vernelle Emerald

## 2014-04-17 NOTE — Discharge Instructions (Signed)
Acromioclavicular Injuries °The AC (acromioclavicular) joint is the joint in the shoulder where the collarbone (clavicle) meets the shoulder blade (scapula). The part of the shoulder blade connected to the collarbone is called the acromion. Common problems with and treatments for the AC joint are detailed below. °ARTHRITIS °Arthritis occurs when the joint has been injured and the smooth padding between the joints (cartilage) is lost. This is the wear and tear seen in most joints of the body if they have been overused. This causes the joint to produce pain and swelling which is worse with activity.  °AC JOINT SEPARATION °AC joint separation means that the ligaments connecting the acromion of the shoulder blade and collarbone have been damaged, and the two bones no longer line up. AC separations can be anywhere from mild to severe, and are "graded" depending upon which ligaments are torn and how badly they are torn. °· Grade I Injury: the least damage is done, and the AC joint still lines up. °· Grade II Injury: damage to the ligaments which reinforce the AC joint. In a Grade II injury, these ligaments are stretched but not entirely torn. When stressed, the AC joint becomes painful and unstable. °· Grade III Injury: AC and secondary ligaments are completely torn, and the collarbone is no longer attached to the shoulder blade. This results in deformity; a prominence of the end of the clavicle. °AC JOINT FRACTURE °AC joint fracture means that there has been a break in the bones of the AC joint, usually the end of the clavicle. °TREATMENT °TREATMENT OF AC ARTHRITIS °· There is currently no way to replace the cartilage damaged by arthritis. The best way to improve the condition is to decrease the activities which aggravate the problem. Application of ice to the joint helps decrease pain and soreness (inflammation). The use of non-steroidal anti-inflammatory medication is helpful. °· If less conservative measures do not  work, then cortisone shots (injections) may be used. These are anti-inflammatories; they decrease the soreness in the joint and swelling. °· If non-surgical measures fail, surgery may be recommended. The procedure is generally removal of a portion of the end of the clavicle. This is the part of the collarbone closest to your acromion which is stabilized with ligaments to the acromion of the shoulder blade. This surgery may be performed using a tube-like instrument with a light (arthroscope) for looking into a joint. It may also be performed as an open surgery through a small incision by the surgeon. Most patients will have good range of motion within 6 weeks and may return to all activity including sports by 8-12 weeks, barring complications. °TREATMENT OF AN AC SEPARATION °· The initial treatment is to decrease pain. This is best accomplished by immobilizing the arm in a sling and placing an ice pack to the shoulder for 20 to 30 minutes every 2 hours as needed. As the pain starts to subside, it is important to begin moving the fingers, wrist, elbow and eventually the shoulder in order to prevent a stiff or "frozen" shoulder. Instruction on when and how much to move the shoulder will be provided by your caregiver. The length of time needed to regain full motion and function depends on the amount or grade of the injury. Recovery from a Grade I AC separation usually takes 10 to 14 days, whereas a Grade III may take 6 to 8 weeks. °· Grade I and II separations usually do not require surgery. Even Grade III injuries usually allow return to full   I and II separations usually do not require surgery. Even Grade III injuries usually allow return to full activity with few restrictions. Treatment is also based on the activity demands of the injured shoulder. For example, a high level quarterback with an injured throwing arm will receive more aggressive treatment than someone with a desk job who rarely uses his/her arm for strenuous activities. In some cases, a painful lump may persist which could require a later surgery. Surgery  can be very successful, but the benefits must be weighed against the potential risks.  TREATMENT OF AN AC JOINT FRACTURE  Fracture treatment depends on the type of fracture. Sometimes a splint or sling may be all that is required. Other times surgery may be required for repair. This is more frequently the case when the ligaments supporting the clavicle are completely torn. Your caregiver will help you with these decisions and together you can decide what will be the best treatment.  HOME CARE INSTRUCTIONS    Apply ice to the injury for 15-20 minutes each hour while awake for 2 days. Put the ice in a plastic bag and place a towel between the bag of ice and skin.   If a sling has been applied, wear it constantly for as long as directed by your caregiver, even at night. The sling or splint can be removed for bathing or showering or as directed. Be sure to keep the shoulder in the same place as when the sling is on. Do not lift the arm.   If a figure-of-eight splint has been applied it should be tightened gently by another person every day. Tighten it enough to keep the shoulders held back. Allow enough room to place the index finger between the body and strap. Loosen the splint immediately if there is numbness or tingling in the hands.   Take over-the-counter or prescription medicines for pain, discomfort or fever as directed by your caregiver.   If you or your child has received a follow up appointment, it is very important to keep that appointment in order to avoid long term complications, chronic pain or disability.  SEEK MEDICAL CARE IF:    The pain is not relieved with medications.   There is increased swelling or discoloration that continues to get worse rather than better.   You or your child has been unable to follow up as instructed.   There is progressive numbness and tingling in the arm, forearm or hand.  SEEK IMMEDIATE MEDICAL CARE IF:    The arm is numb, cold or pale.   There is increasing pain  in the hand, forearm or fingers.  MAKE SURE YOU:    Understand these instructions.   Will watch your condition.   Will get help right away if you are not doing well or get worse.  Document Released: 09/11/2005 Document Revised: 02/24/2012 Document Reviewed: 03/06/2009  [employer] Patient Information 2014 [employer].

## 2014-04-17 NOTE — ED Notes (Signed)
Onset 11am pt getting out of shower and slipped on water on floor, fell on left shoulder.  Pain on top of left shoulder and down left arm to elbow.  Pt able to move arm but painful to do so above shoulder.  Pt did not hit head during fall.  Pt has had left knee problems x 1 month, pain on top of knee cap.  Pt has appt scheduled in 1 week but d/t pain is unable to wait that long.  Pt has h/o right knee pain but has not been seen for the left knee yet.  No other s/s noted.

## 2014-04-18 NOTE — ED Provider Notes (Signed)
Medical screening examination/treatment/procedure(s) were performed by non-physician practitioner and as supervising physician I was immediately available for consultation/collaboration.   EKG Interpretation None        Alfonzo Feller, DO 04/18/14 1405

## 2014-04-20 ENCOUNTER — Ambulatory Visit: Payer: Medicaid Other | Admitting: Internal Medicine

## 2014-04-21 ENCOUNTER — Encounter: Payer: Self-pay | Admitting: Internal Medicine

## 2014-05-12 ENCOUNTER — Ambulatory Visit: Payer: Medicaid Other | Admitting: Family Medicine

## 2014-05-23 ENCOUNTER — Emergency Department (HOSPITAL_COMMUNITY)
Admission: EM | Admit: 2014-05-23 | Discharge: 2014-05-23 | Disposition: A | Discharge status: Home / Self Care | Payer: Medicaid Other | Attending: Emergency Medicine | Admitting: Emergency Medicine

## 2014-05-23 ENCOUNTER — Encounter (HOSPITAL_COMMUNITY): Payer: Self-pay | Admitting: Emergency Medicine

## 2014-05-23 ENCOUNTER — Emergency Department (HOSPITAL_COMMUNITY): Payer: Medicaid Other

## 2014-05-23 DIAGNOSIS — Z792 Long term (current) use of antibiotics: Secondary | ICD-10-CM | POA: Insufficient documentation

## 2014-05-23 DIAGNOSIS — F411 Generalized anxiety disorder: Secondary | ICD-10-CM | POA: Insufficient documentation

## 2014-05-23 DIAGNOSIS — Z7982 Long term (current) use of aspirin: Secondary | ICD-10-CM | POA: Insufficient documentation

## 2014-05-23 DIAGNOSIS — M25569 Pain in unspecified knee: Secondary | ICD-10-CM | POA: Insufficient documentation

## 2014-05-23 DIAGNOSIS — I10 Essential (primary) hypertension: Secondary | ICD-10-CM | POA: Insufficient documentation

## 2014-05-23 DIAGNOSIS — I252 Old myocardial infarction: Secondary | ICD-10-CM | POA: Insufficient documentation

## 2014-05-23 DIAGNOSIS — Z880 Allergy status to penicillin: Secondary | ICD-10-CM | POA: Insufficient documentation

## 2014-05-23 DIAGNOSIS — R0602 Shortness of breath: Secondary | ICD-10-CM

## 2014-05-23 DIAGNOSIS — J45901 Unspecified asthma with (acute) exacerbation: Secondary | ICD-10-CM

## 2014-05-23 DIAGNOSIS — Z8719 Personal history of other diseases of the digestive system: Secondary | ICD-10-CM | POA: Insufficient documentation

## 2014-05-23 DIAGNOSIS — J441 Chronic obstructive pulmonary disease with (acute) exacerbation: Secondary | ICD-10-CM | POA: Insufficient documentation

## 2014-05-23 DIAGNOSIS — Z87891 Personal history of nicotine dependence: Secondary | ICD-10-CM | POA: Insufficient documentation

## 2014-05-23 DIAGNOSIS — G8929 Other chronic pain: Secondary | ICD-10-CM | POA: Insufficient documentation

## 2014-05-23 DIAGNOSIS — M25561 Pain in right knee: Secondary | ICD-10-CM

## 2014-05-23 DIAGNOSIS — I5032 Chronic diastolic (congestive) heart failure: Secondary | ICD-10-CM | POA: Insufficient documentation

## 2014-05-23 DIAGNOSIS — Z79899 Other long term (current) drug therapy: Secondary | ICD-10-CM | POA: Insufficient documentation

## 2014-05-23 DIAGNOSIS — R002 Palpitations: Secondary | ICD-10-CM

## 2014-05-23 DIAGNOSIS — E669 Obesity, unspecified: Secondary | ICD-10-CM | POA: Insufficient documentation

## 2014-05-23 DIAGNOSIS — E039 Hypothyroidism, unspecified: Secondary | ICD-10-CM | POA: Insufficient documentation

## 2014-05-23 DIAGNOSIS — Z9889 Other specified postprocedural states: Secondary | ICD-10-CM | POA: Insufficient documentation

## 2014-05-23 DIAGNOSIS — R079 Chest pain, unspecified: Secondary | ICD-10-CM

## 2014-05-23 LAB — CBC WITH DIFFERENTIAL/PLATELET
BASOS ABS: 0 10*3/uL (ref 0.0–0.1)
BASOS PCT: 0 % (ref 0–1)
EOS ABS: 0.4 10*3/uL (ref 0.0–0.7)
Eosinophils Relative: 6 % — ABNORMAL HIGH (ref 0–5)
HEMATOCRIT: 36.8 % (ref 36.0–46.0)
Hemoglobin: 12.1 g/dL (ref 12.0–15.0)
Lymphocytes Relative: 31 % (ref 12–46)
Lymphs Abs: 2.2 10*3/uL (ref 0.7–4.0)
MCH: 27.7 pg (ref 26.0–34.0)
MCHC: 32.9 g/dL (ref 30.0–36.0)
MCV: 84.2 fL (ref 78.0–100.0)
MONO ABS: 0.6 10*3/uL (ref 0.1–1.0)
Monocytes Relative: 9 % (ref 3–12)
Neutro Abs: 3.7 10*3/uL (ref 1.7–7.7)
Neutrophils Relative %: 54 % (ref 43–77)
PLATELETS: 241 10*3/uL (ref 150–400)
RBC: 4.37 MIL/uL (ref 3.87–5.11)
RDW: 14.6 % (ref 11.5–15.5)
WBC: 6.8 10*3/uL (ref 4.0–10.5)

## 2014-05-23 LAB — PRO B NATRIURETIC PEPTIDE: PRO B NATRI PEPTIDE: 350.8 pg/mL — AB (ref 0–125)

## 2014-05-23 LAB — BASIC METABOLIC PANEL
BUN: 13 mg/dL (ref 6–23)
CALCIUM: 9.2 mg/dL (ref 8.4–10.5)
CO2: 25 meq/L (ref 19–32)
Chloride: 102 mEq/L (ref 96–112)
Creatinine, Ser: 0.78 mg/dL (ref 0.50–1.10)
GFR calc Af Amer: >90 mL/min (ref >90)
Glucose, Bld: 84 mg/dL (ref 70–99)
Potassium: 4.1 mEq/L (ref 3.7–5.3)
Sodium: 141 mEq/L (ref 137–147)

## 2014-05-23 LAB — I-STAT TROPONIN, ED: Troponin i, poc: 0 ng/mL (ref 0.00–0.08)

## 2014-05-23 MED ORDER — OXYCODONE HCL 5 MG PO TABS: 5.0000 mg | ORAL_TABLET | ORAL | Status: DC | PRN

## 2014-05-23 NOTE — ED Notes (Signed)
Patient transported to X-ray 

## 2014-05-23 NOTE — ED Notes (Signed)
The patient said she started having "fluttering" or palpitations on Saturday morning.  Today, she started having SOB along with the "fluttering".  She is also complaining of knee pain and swelling that she has been having for two months.  The patient said it has gotten worse so she decided to come in to be evaluated.

## 2014-05-23 NOTE — Discharge Instructions (Signed)
Take the prescribed medication as directed for pain. Follow-up with Dr. Noemi Chapel regarding knee pain. Follow-up with Dr. Rayann Heman if chest pain recurs. Follow-up with your primary care physician for other concerns. Return to the ED for new or worsening symptoms.

## 2014-05-23 NOTE — ED Provider Notes (Signed)
Medical screening examination/treatment/procedure(s) were performed by non-physician practitioner and as supervising physician I was immediately available for consultation/collaboration.   EKG Interpretation   Date/Time:  Monday May 23 2014 18:25:22 EDT Ventricular Rate:  60 PR Interval:  148 QRS Duration: 93 QT Interval:  460 QTC Calculation: 460 R Axis:   28 Text Interpretation:  Sinus rhythm Similar to prior Confirmed by Mingo Amber   MD, [place] ([id number]) on 05/23/2014 6:36:16 PM        Osvaldo Shipper, MD 05/23/14 2221

## 2014-05-23 NOTE — ED Provider Notes (Signed)
CSN: [id number]     Arrival date & time 05/23/14  1451 History   First MD Initiated Contact with Patient 05/23/14 1820     Chief Complaint  Patient presents with  . Shortness of Breath    Patient is having SOB, and is having knee pain, she does have some swelling to her right knee  . Palpitations  . Knee Pain     (Consider location/radiation/quality/duration/timing/severity/associated sxs/prior Treatment) The history is provided by the patient and medical records.   This is a 58 year old female with past medical history significant for hypertension, anxiety, COPD, congestive heart failure, chronic chest wall pain, presenting to the ED for palpitations and chest pain which began on Saturday morning while she was sitting on the couch watching television.  Pt states symptoms have been persistent since that time, but she developed shortness of breath earlier today.  Denies any associated pain of upper extremities or neck, dizziness, weakness, nausea, or vomiting. The patient denies any significant weight gain or orthopnea. She does admit to a chronic dry cough. She denies any recent sick contacts, fever, or chills. Patient is followed daily by cardiology, Dr. Rayann Heman.  Patient also complains of chronic right knee pain. States has been worsening over the past 2 months.  States she feels that her knee is "swollen".  She states she was seen last year by Dr. Noemi Chapel for drainage of a knee effusion. She has no prior history of gout.  She denies any recent injury, trauma, or falls.  No difficulty walking.  Past Medical History  Diagnosis Date  . Chest pain     a. s/p reported MI in 1997;  b. 09/2012 Cath: nl cors, nl LV ([place]);  c. 05/2012 Non-ischemic myoview.  . Hypertension   . Arthritis   . Problem with literacy   . Shortness of breath   . Anxiety   . Bronchitis   . Chronic headaches   . Obesity   . COPD (chronic obstructive pulmonary disease)   . Chronic diastolic CHF (congestive heart  failure)     a. 12/2011 Echo: nl ef, Gr 1 DD;  b. 11/2012 Echo: EF 45-50%, no significant valvular abnormalities.  Marland Kitchen GERD (gastroesophageal reflux disease)   . Myocardial infarction 1997    self reported  . Hypothyroidism   . Asthma   . Chronic chest pain    Past Surgical History  Procedure Laterality Date  . Ectopic pregnancy surgery      right  . Nm myoview ltd  June 2013    no reversible ischemia  . Cardiac catheterization  October 2012    No coronary artery disease  . Cardiac catheterization  2012    normal coronary arteries  . Nm myoview ltd  2012, 2013    normal  . 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 Knee arthroscopy  08/13/13    right   Family History  Problem Relation Age of Onset  . Cancer Mother 14    lung ca  . Diabetes Mother   . Anxiety disorder Sister   . Cancer Brother 60    lung  . Colon cancer Neg Hx   . Pancreatic cancer Neg Hx   . Rectal cancer Neg Hx   . Stomach cancer Neg Hx    History  Substance Use Topics  . Smoking status: Former Smoker -- 1.00 packs/day for 3 years    Quit date: 12/03/2010  .  Smokeless tobacco: Never Used  . Alcohol Use: No   OB History   Grav Para Term Preterm Abortions TAB SAB Ect Mult Living                 Review of Systems  Respiratory: Positive for cough and shortness of breath.   Cardiovascular: Positive for chest pain and palpitations.  Musculoskeletal: Positive for arthralgias.  All other systems reviewed and are negative.     Allergies  Darvocet; Ibuprofen; Tramadol; Tylenol; Celebrex; Naproxen; and Penicillins  Home Medications   Prior to Admission medications   Medication Sig Start Date End Date Taking? Authorizing Provider  aspirin EC 81 MG tablet Take 81 mg by mouth daily.    Historical Provider, MD  atorvastatin (LIPITOR) 40 MG tablet Take 40 mg by mouth daily.    Historical Provider, MD  beclomethasone (QVAR) 80 MCG/ACT inhaler Inhale 2 puffs into the lungs  2 (two) times daily as needed (shortness of breath/ wheezing).     Historical Provider, MD  butalbital-acetaminophen-caffeine (FIORICET, ESGIC) 50-325-40 MG per tablet Take 1 tablet by mouth every 6 (six) hours as needed for headache.    Historical Provider, MD  ciprofloxacin (CIPRO) 500 MG tablet Take 1 tablet (500 mg total) by mouth 2 (two) times daily. 04/03/14   Fredia Sorrow, MD  diclofenac (CATAFLAM) 50 MG tablet Take 1 tablet (50 mg total) by mouth 2 (two) times daily. 03/25/14   Renaldo Reel, MD  HYDROmorphone (DILAUDID) 4 MG tablet Take 1 tablet (4 mg total) by mouth every 6 (six) hours as needed for severe pain. 04/03/14   Fredia Sorrow, MD  levothyroxine (SYNTHROID, LEVOTHROID) 75 MCG tablet Take 75 mcg by mouth daily before breakfast.    Historical Provider, MD  metoprolol succinate (TOPROL-XL) 25 MG 24 hr tablet Take 37.5 mg by mouth daily.    Historical Provider, MD  PARoxetine (PAXIL) 20 MG tablet Take 1 tablet (20 mg total) by mouth daily. 03/15/14   Andrena Mews, MD   BP 118/72  Pulse 85  Temp(Src) 97.8 F (36.6 C) (Oral)  Resp 11  Wt 221 lb 9 oz (100.5 kg)  SpO2 100%  Physical Exam  Nursing note and vitals reviewed. Constitutional: She is oriented to person, place, and time. She appears well-developed and well-nourished. No distress.  HENT:  Head: Normocephalic and atraumatic.  Mouth/Throat: Oropharynx is clear and moist.  Eyes: Conjunctivae and EOM are normal. Pupils are equal, round, and reactive to light.  Neck: Normal range of motion. Neck supple.  Cardiovascular: Normal rate, regular rhythm and normal heart sounds.   Pulmonary/Chest: Effort normal and breath sounds normal. No respiratory distress. She has no wheezes.  Abdominal: Soft. Bowel sounds are normal. There is no tenderness. There is no guarding.  Musculoskeletal: Normal range of motion. She exhibits no edema.  Right knee with mild tenderness over her patella, no significant swelling when compared with  left knee, no erythema or warmth to touch, full range of motion maintained without difficulty, distal sensation intact, strong DP pulse No calf tenderness, palpable cords, or pitting edema of either lower extremity  Neurological: She is alert and oriented to person, place, and time.  Skin: Skin is warm and dry. She is not diaphoretic.  Psychiatric: She has a normal mood and affect.    ED Course  Procedures (including critical care time) Labs Review Labs Reviewed  CBC WITH DIFFERENTIAL - Abnormal; Notable for the following:    Eosinophils Relative 6 (*)    All  other components within normal limits  PRO B NATRIURETIC PEPTIDE - Abnormal; Notable for the following:    Pro B Natriuretic peptide (BNP) 350.8 (*)    All other components within normal limits  BASIC METABOLIC PANEL  I-STAT TROPOININ, ED    Imaging Review Dg Chest 2 View  05/23/2014   CLINICAL DATA:  Shortness of breath, palpitations  EXAM: CHEST  2 VIEW  COMPARISON:  03/25/2014  FINDINGS: Mild left basilar opacity, likely atelectasis correlate with lateral view. No focal consolidation. No pleural effusion or pneumothorax.  The heart is top normal in size.  Mild degenerative changes of the visualized thoracolumbar spine.  IMPRESSION: No evidence of acute cardiopulmonary disease.   Electronically Signed   By: Julian Hy M.D.   On: 05/23/2014 20:00     EKG Interpretation   Date/Time:  Monday May 23 2014 18:25:22 EDT Ventricular Rate:  60 PR Interval:  148 QRS Duration: 93 QT Interval:  460 QTC Calculation: 460 R Axis:   28 Text Interpretation:  Sinus rhythm Similar to prior Confirmed by Mingo Amber   MD, [place] ([id number]) on 05/23/2014 6:36:16 PM      MDM   Final diagnoses:  Chest pain  SOB (shortness of breath)  Palpitations  Right knee pain   This is a 58 year old female with frequent ED visits for chest pain and knee pain, presenting to the ED for the aforementioned complaints. Her chest pain has been ongoing for  the past 3 days with new shortness of breath that developed earlier today. On exam, she is afebrile and nontoxic appearing. Her respirations are unlabored and her O2 sats are stable on room air. She does not appear fluid overloaded. Her right knee does not appear grossly swollen when compared with left, she has mild tenderness of her patella without bony deformity or signs of trauma.  No signs of gout or septic joint.  Will obtain basic labs, EKG, CXR.   EKG sinus rhythm without ischemic change. Labs are reassuring, BNP mildly elevated at 350 which appears baseline per patient when compared with previous. Troponin is negative after 3 days of ongoing chest pain.  Chest x-ray is clear without evidence of vascular congestion or pulmonary edema.  Patient has had no signs of respiratory distress in the ED and her VS have remained stable, in NSR on monitor. At this time I have low suspicion for ACS, PE, dissection, or other acute cardiac event. She'll be discharged home instructed to followup with her cardiologist in regards to her chest pain. She'll followup with orthopedics for her ongoing right knee pain. Patient given a short supply of oxycodone for pain relief.  Discussed plan with patient, he/she acknowledged understanding and agreed with plan of care.  Return precautions given for new or worsening symptoms.  Larene Pickett, PA-C 05/23/14 2030  Larene Pickett, PA-C 05/23/14 2034

## 2014-05-26 ENCOUNTER — Encounter (HOSPITAL_COMMUNITY): Payer: Self-pay | Admitting: Emergency Medicine

## 2014-05-26 ENCOUNTER — Emergency Department (HOSPITAL_COMMUNITY)
Admission: EM | Admit: 2014-05-26 | Discharge: 2014-05-26 | Disposition: A | Discharge status: Home / Self Care | Payer: Medicaid Other | Attending: Emergency Medicine | Admitting: Emergency Medicine

## 2014-05-26 DIAGNOSIS — Z792 Long term (current) use of antibiotics: Secondary | ICD-10-CM | POA: Insufficient documentation

## 2014-05-26 DIAGNOSIS — Z8719 Personal history of other diseases of the digestive system: Secondary | ICD-10-CM | POA: Insufficient documentation

## 2014-05-26 DIAGNOSIS — E039 Hypothyroidism, unspecified: Secondary | ICD-10-CM | POA: Insufficient documentation

## 2014-05-26 DIAGNOSIS — F411 Generalized anxiety disorder: Secondary | ICD-10-CM | POA: Insufficient documentation

## 2014-05-26 DIAGNOSIS — E669 Obesity, unspecified: Secondary | ICD-10-CM | POA: Insufficient documentation

## 2014-05-26 DIAGNOSIS — J4489 Other specified chronic obstructive pulmonary disease: Secondary | ICD-10-CM | POA: Insufficient documentation

## 2014-05-26 DIAGNOSIS — Z87891 Personal history of nicotine dependence: Secondary | ICD-10-CM | POA: Insufficient documentation

## 2014-05-26 DIAGNOSIS — Z791 Long term (current) use of non-steroidal anti-inflammatories (NSAID): Secondary | ICD-10-CM | POA: Insufficient documentation

## 2014-05-26 DIAGNOSIS — Z9889 Other specified postprocedural states: Secondary | ICD-10-CM | POA: Insufficient documentation

## 2014-05-26 DIAGNOSIS — R51 Headache: Secondary | ICD-10-CM | POA: Insufficient documentation

## 2014-05-26 DIAGNOSIS — Z7982 Long term (current) use of aspirin: Secondary | ICD-10-CM | POA: Insufficient documentation

## 2014-05-26 DIAGNOSIS — I10 Essential (primary) hypertension: Secondary | ICD-10-CM | POA: Insufficient documentation

## 2014-05-26 DIAGNOSIS — I252 Old myocardial infarction: Secondary | ICD-10-CM | POA: Insufficient documentation

## 2014-05-26 DIAGNOSIS — G8929 Other chronic pain: Secondary | ICD-10-CM | POA: Insufficient documentation

## 2014-05-26 DIAGNOSIS — IMO0002 Reserved for concepts with insufficient information to code with codable children: Secondary | ICD-10-CM

## 2014-05-26 DIAGNOSIS — M1710 Unilateral primary osteoarthritis, unspecified knee: Secondary | ICD-10-CM | POA: Insufficient documentation

## 2014-05-26 DIAGNOSIS — J449 Chronic obstructive pulmonary disease, unspecified: Secondary | ICD-10-CM | POA: Insufficient documentation

## 2014-05-26 DIAGNOSIS — Z880 Allergy status to penicillin: Secondary | ICD-10-CM | POA: Insufficient documentation

## 2014-05-26 DIAGNOSIS — Z79899 Other long term (current) drug therapy: Secondary | ICD-10-CM | POA: Insufficient documentation

## 2014-05-26 DIAGNOSIS — I5032 Chronic diastolic (congestive) heart failure: Secondary | ICD-10-CM | POA: Insufficient documentation

## 2014-05-26 DIAGNOSIS — M25569 Pain in unspecified knee: Secondary | ICD-10-CM | POA: Insufficient documentation

## 2014-05-26 DIAGNOSIS — M25561 Pain in right knee: Secondary | ICD-10-CM

## 2014-05-26 NOTE — ED Provider Notes (Signed)
CSN: [id number]     Arrival date & time 05/26/14  1226 History  This chart was scribed for Quincy Carnes PA-C working with Orpah Greek, MD by Stacy Gardner, ED scribe. This patient was seen in room WTR8/WTR8 and the patient's care was started at 2:18 PM.  First MD Initiated Contact with Patient 05/26/14 1409     Chief Complaint  Patient presents with  . Knee Pain     (Consider location/radiation/quality/duration/timing/severity/associated sxs/prior Treatment) HPI HPI Comments: [name] is a 58 y.o. female with hx of arthritis and obesity who presents to the Emergency Department complaining of chronic, progressively, worsening, constant, bilateral knee pain for the past two weeks, R > L. Pt has tried taking the pain medication prescribed from ED visit 3 days ago (oxycodone) but that does not seem to help.  She has an appointment to be seen by an orthopedist the middle of next month. She also followed up with her PCP but she is having difficulty scheduling an appointment sooner than she would prefer.  Nothing seems to help the pain. She had a right knee arthroscopy in the past, no other surgeries.  No numbness, paresthesias, or weakness of right leg.  No new injuries, trauma, or falls.    Past Medical History  Diagnosis Date  . Chest pain     a. s/p reported MI in 1997;  b. 09/2012 Cath: nl cors, nl LV ([place]);  c. 05/2012 Non-ischemic myoview.  . Hypertension   . Arthritis   . Problem with literacy   . Shortness of breath   . Anxiety   . Bronchitis   . Chronic headaches   . Obesity   . COPD (chronic obstructive pulmonary disease)   . Chronic diastolic CHF (congestive heart failure)     a. 12/2011 Echo: nl ef, Gr 1 DD;  b. 11/2012 Echo: EF 45-50%, no significant valvular abnormalities.  Marland Kitchen GERD (gastroesophageal reflux disease)   . Myocardial infarction 1997    self reported  . Hypothyroidism   . Asthma   . Chronic chest pain    Past Surgical History   Procedure Laterality Date  . Ectopic pregnancy surgery      right  . Nm myoview ltd  June 2013    no reversible ischemia  . Cardiac catheterization  October 2012    No coronary artery disease  . Cardiac catheterization  2012    normal coronary arteries  . Nm myoview ltd  2012, 2013    normal  . 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 Knee arthroscopy  08/13/13    right   Family History  Problem Relation Age of Onset  . Cancer Mother 33    lung ca  . Diabetes Mother   . Anxiety disorder Sister   . Cancer Brother 60    lung  . Colon cancer Neg Hx   . Pancreatic cancer Neg Hx   . Rectal cancer Neg Hx   . Stomach cancer Neg Hx    History  Substance Use Topics  . Smoking status: Former Smoker -- 1.00 packs/day for 3 years    Quit date: 12/03/2010  . Smokeless tobacco: Never Used  . Alcohol Use: No   OB History   Grav Para Term Preterm Abortions TAB SAB Ect Mult Living                 Review of Systems  Musculoskeletal:  Positive for arthralgias.  All other systems reviewed and are negative.     Allergies  Darvocet; Ibuprofen; Tramadol; Tylenol; Celebrex; Naproxen; and Penicillins  Home Medications   Prior to Admission medications   Medication Sig Start Date End Date Taking? Authorizing Provider  aspirin EC 81 MG tablet Take 81 mg by mouth daily.    Historical Provider, MD  atorvastatin (LIPITOR) 40 MG tablet Take 40 mg by mouth daily.    Historical Provider, MD  beclomethasone (QVAR) 80 MCG/ACT inhaler Inhale 2 puffs into the lungs 2 (two) times daily as needed (shortness of breath/ wheezing).     Historical Provider, MD  butalbital-acetaminophen-caffeine (FIORICET, ESGIC) 50-325-40 MG per tablet Take 1 tablet by mouth every 6 (six) hours as needed for headache.    Historical Provider, MD  ciprofloxacin (CIPRO) 500 MG tablet Take 1 tablet (500 mg total) by mouth 2 (two) times daily. 04/03/14   Fredia Sorrow, MD  diclofenac  (CATAFLAM) 50 MG tablet Take 1 tablet (50 mg total) by mouth 2 (two) times daily. 03/25/14   Renaldo Reel, MD  HYDROmorphone (DILAUDID) 4 MG tablet Take 1 tablet (4 mg total) by mouth every 6 (six) hours as needed for severe pain. 04/03/14   Fredia Sorrow, MD  levothyroxine (SYNTHROID, LEVOTHROID) 75 MCG tablet Take 75 mcg by mouth daily before breakfast.    Historical Provider, MD  metoprolol succinate (TOPROL-XL) 25 MG 24 hr tablet Take 37.5 mg by mouth daily.    Historical Provider, MD  oxyCODONE (OXY IR/ROXICODONE) 5 MG immediate release tablet Take 1 tablet (5 mg total) by mouth every 4 (four) hours as needed for severe pain. 05/23/14   Larene Pickett, PA-C  PARoxetine (PAXIL) 20 MG tablet Take 1 tablet (20 mg total) by mouth daily. 03/15/14   Andrena Mews, MD   BP 132/68  Pulse 76  Temp(Src) 98.3 F (36.8 C) (Oral)  Resp 18  SpO2 100%  Physical Exam  Nursing note and vitals reviewed. Constitutional: She is oriented to person, place, and time. She appears well-developed and well-nourished. No distress.  HENT:  Head: Normocephalic and atraumatic.  Mouth/Throat: Oropharynx is clear and moist.  Eyes: Conjunctivae and EOM are normal. Pupils are equal, round, and reactive to light.  Neck: Normal range of motion. Neck supple.  Cardiovascular: Normal rate, regular rhythm and normal heart sounds.   Pulmonary/Chest: Effort normal and breath sounds normal.  Musculoskeletal: Normal range of motion. She exhibits tenderness.       Right knee: Tenderness found.       Legs: Right knee with mild tenderness over her patella, no significant swelling when compared with left knee, no erythema or warmth to touch, full range of motion maintained without difficulty, distal sensation intact, strong DP pulse No calf tenderness, palpable cords, or pitting edema of either lower extremity   Neurological: She is alert and oriented to person, place, and time.  Skin: Skin is warm and dry. She is not diaphoretic.   Psychiatric: She has a normal mood and affect.    ED Course  Procedures (including critical care time) DIAGNOSTIC STUDIES: Oxygen Saturation is 100% on room air, normal by my interpretation.    COORDINATION OF CARE:  2:23 PM Discussed course of care with pt which includes referral to another orthopedist. Pt understands and agrees.    Labs Review Labs Reviewed - No data to display  Imaging Review No results found.   EKG Interpretation None       Final diagnoses:  Right knee pain   Pt complained of headache in triage however she did not mention this at any point during my evaluation.  Pt has had 24 ED visits in the past 6 months for similar complaints and i I personally evaluated pt in the ED at [facility] 3 days ago for right knee pain.  Exam today is unchanged.  She was given pain medication at prior ED visit and instructed to FU with orthopedics as there is nothing more to be done in the ED at this time.  I have advised her she must FU elsewhere for this chronic complaint.  Discussed plan with patient, he/she acknowledged understanding and agreed with plan of care.  Return precautions given for new or worsening symptoms.   Larene Pickett, PA-C 05/26/14 1433

## 2014-05-26 NOTE — ED Notes (Signed)
Pt states that she has been having bilateral knee pain x years.  Also c/o headache x 2 wks.

## 2014-05-26 NOTE — Discharge Instructions (Signed)
Continue taking your pain medications from visit the other day. Call Dr. Trevor Mace office to see if you can get an earlier appt for follow-up. Return to the ED for new concerns.

## 2014-05-27 NOTE — ED Provider Notes (Signed)
Medical screening examination/treatment/procedure(s) were performed by non-physician practitioner and as supervising physician I was immediately available for consultation/collaboration.   Orpah Greek, MD 05/27/14 [phone]

## 2014-05-28 ENCOUNTER — Emergency Department (HOSPITAL_COMMUNITY)
Admission: EM | Admit: 2014-05-28 | Discharge: 2014-05-28 | Disposition: A | Discharge status: Home / Self Care | Payer: Medicaid Other | Attending: Emergency Medicine | Admitting: Emergency Medicine

## 2014-05-28 ENCOUNTER — Encounter (HOSPITAL_COMMUNITY): Payer: Self-pay | Admitting: Emergency Medicine

## 2014-05-28 ENCOUNTER — Emergency Department (HOSPITAL_COMMUNITY): Payer: Medicaid Other

## 2014-05-28 DIAGNOSIS — IMO0002 Reserved for concepts with insufficient information to code with codable children: Secondary | ICD-10-CM | POA: Insufficient documentation

## 2014-05-28 DIAGNOSIS — Y929 Unspecified place or not applicable: Secondary | ICD-10-CM | POA: Insufficient documentation

## 2014-05-28 DIAGNOSIS — Z79899 Other long term (current) drug therapy: Secondary | ICD-10-CM | POA: Insufficient documentation

## 2014-05-28 DIAGNOSIS — Z7982 Long term (current) use of aspirin: Secondary | ICD-10-CM | POA: Insufficient documentation

## 2014-05-28 DIAGNOSIS — Z880 Allergy status to penicillin: Secondary | ICD-10-CM | POA: Insufficient documentation

## 2014-05-28 DIAGNOSIS — Z8719 Personal history of other diseases of the digestive system: Secondary | ICD-10-CM | POA: Insufficient documentation

## 2014-05-28 DIAGNOSIS — I252 Old myocardial infarction: Secondary | ICD-10-CM | POA: Insufficient documentation

## 2014-05-28 DIAGNOSIS — Z9889 Other specified postprocedural states: Secondary | ICD-10-CM | POA: Insufficient documentation

## 2014-05-28 DIAGNOSIS — S8390XA Sprain of unspecified site of unspecified knee, initial encounter: Secondary | ICD-10-CM

## 2014-05-28 DIAGNOSIS — G8929 Other chronic pain: Secondary | ICD-10-CM | POA: Insufficient documentation

## 2014-05-28 DIAGNOSIS — E039 Hypothyroidism, unspecified: Secondary | ICD-10-CM | POA: Insufficient documentation

## 2014-05-28 DIAGNOSIS — J4489 Other specified chronic obstructive pulmonary disease: Secondary | ICD-10-CM | POA: Insufficient documentation

## 2014-05-28 DIAGNOSIS — E669 Obesity, unspecified: Secondary | ICD-10-CM | POA: Insufficient documentation

## 2014-05-28 DIAGNOSIS — I10 Essential (primary) hypertension: Secondary | ICD-10-CM | POA: Insufficient documentation

## 2014-05-28 DIAGNOSIS — M129 Arthropathy, unspecified: Secondary | ICD-10-CM | POA: Insufficient documentation

## 2014-05-28 DIAGNOSIS — I5032 Chronic diastolic (congestive) heart failure: Secondary | ICD-10-CM | POA: Insufficient documentation

## 2014-05-28 DIAGNOSIS — W010XXA Fall on same level from slipping, tripping and stumbling without subsequent striking against object, initial encounter: Secondary | ICD-10-CM | POA: Insufficient documentation

## 2014-05-28 DIAGNOSIS — Y9389 Activity, other specified: Secondary | ICD-10-CM | POA: Insufficient documentation

## 2014-05-28 DIAGNOSIS — S8000XA Contusion of unspecified knee, initial encounter: Secondary | ICD-10-CM | POA: Insufficient documentation

## 2014-05-28 DIAGNOSIS — F411 Generalized anxiety disorder: Secondary | ICD-10-CM | POA: Insufficient documentation

## 2014-05-28 DIAGNOSIS — Z87891 Personal history of nicotine dependence: Secondary | ICD-10-CM | POA: Insufficient documentation

## 2014-05-28 DIAGNOSIS — J449 Chronic obstructive pulmonary disease, unspecified: Secondary | ICD-10-CM | POA: Insufficient documentation

## 2014-05-28 MED ORDER — OXYCODONE-ACETAMINOPHEN 5-325 MG PO TABS
2.0000 | ORAL_TABLET | Freq: Once | ORAL | Status: AC
  Administered 2014-05-28: 2 via ORAL
  Filled 2014-05-28: qty 2

## 2014-05-28 MED ORDER — OXYCODONE-ACETAMINOPHEN 5-325 MG PO TABS: 1.0000 | ORAL_TABLET | Freq: Four times a day (QID) | ORAL | Status: DC | PRN

## 2014-05-28 NOTE — ED Notes (Signed)
Per ems-- pt reports RLE pain for 2 months but today got worse after walking. Hx arthritis. Leg is swollen. Pt has had to have fluid drained from knee in past. Pt not taking any meds at this time d/t not having them filled. Pt also reported some sob to ems.

## 2014-05-28 NOTE — ED Provider Notes (Signed)
CSN: [id number]     Arrival date & time 05/28/14  2120 History   First MD Initiated Contact with Patient 05/28/14 2156     Chief Complaint  Patient presents with  . Leg Pain     (Consider location/radiation/quality/duration/timing/severity/associated sxs/prior Treatment) Patient is a 58 y.o. female presenting with knee pain. The history is provided by the patient.  Knee Pain Location:  Knee Time since incident: this is a chronic problems but got abruptly worse yesterday after a fall. Injury: yes   Mechanism of injury comment:  Slipped getting out of the shower and fell on her knee Knee location:  R knee Pain details:    Quality:  Aching, throbbing and shooting   Radiates to:  R leg   Severity:  Severe   Onset quality:  Sudden   Timing:  Constant   Progression:  Unchanged Chronicity:  Chronic Prior injury to area:  No Relieved by:  Nothing Worsened by:  Activity and bearing weight Ineffective treatments:  Acetaminophen Associated symptoms: stiffness and swelling   Associated symptoms: no decreased ROM and no muscle weakness   Risk factors: obesity     Past Medical History  Diagnosis Date  . Chest pain     a. s/p reported MI in 1997;  b. 09/2012 Cath: nl cors, nl LV ([place]);  c. 05/2012 Non-ischemic myoview.  . Hypertension   . Arthritis   . Problem with literacy   . Shortness of breath   . Anxiety   . Bronchitis   . Chronic headaches   . Obesity   . COPD (chronic obstructive pulmonary disease)   . Chronic diastolic CHF (congestive heart failure)     a. 12/2011 Echo: nl ef, Gr 1 DD;  b. 11/2012 Echo: EF 45-50%, no significant valvular abnormalities.  Marland Kitchen GERD (gastroesophageal reflux disease)   . Myocardial infarction 1997    self reported  . Hypothyroidism   . Asthma   . Chronic chest pain    Past Surgical History  Procedure Laterality Date  . Ectopic pregnancy surgery      right  . Nm myoview ltd  June 2013    no reversible ischemia  . Cardiac  catheterization  October 2012    No coronary artery disease  . Cardiac catheterization  2012    normal coronary arteries  . Nm myoview ltd  2012, 2013    normal  . 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 Knee arthroscopy  08/13/13    right   Family History  Problem Relation Age of Onset  . Cancer Mother 43    lung ca  . Diabetes Mother   . Anxiety disorder Sister   . Cancer Brother 60    lung  . Colon cancer Neg Hx   . Pancreatic cancer Neg Hx   . Rectal cancer Neg Hx   . Stomach cancer Neg Hx    History  Substance Use Topics  . Smoking status: Former Smoker -- 1.00 packs/day for 3 years    Quit date: 12/03/2010  . Smokeless tobacco: Never Used  . Alcohol Use: No   OB History   Grav Para Term Preterm Abortions TAB SAB Ect Mult Living                 Review of Systems  Musculoskeletal: Positive for stiffness.  All other systems reviewed and are negative.     Allergies  Darvocet; Ibuprofen;  Oxycodone; Tramadol; Tylenol; Celebrex; Naproxen; and Penicillins  Home Medications   Prior to Admission medications   Medication Sig Start Date End Date Taking? Authorizing Provider  aspirin EC 81 MG tablet Take 81 mg by mouth daily.   Yes Historical Provider, MD  atorvastatin (LIPITOR) 40 MG tablet Take 40 mg by mouth daily.   Yes Historical Provider, MD  beclomethasone (QVAR) 40 MCG/ACT inhaler Inhale 2 puffs into the lungs 2 (two) times daily.   Yes Historical Provider, MD  levothyroxine (SYNTHROID, LEVOTHROID) 75 MCG tablet Take 75 mcg by mouth daily before breakfast.   Yes Historical Provider, MD  metoprolol succinate (TOPROL-XL) 50 MG 24 hr tablet Take 50 mg by mouth daily. Take with or immediately following a meal.   Yes Historical Provider, MD  PARoxetine (PAXIL) 20 MG tablet Take 1 tablet (20 mg total) by mouth daily. 03/15/14  Yes Andrena Mews, MD   BP 127/67  Pulse 80  Temp(Src) 98.9 F (37.2 C) (Oral)  Resp 18  Ht 5\' 8"   (1.727 m)  Wt 214 lb (97.07 kg)  BMI 32.55 kg/m2  SpO2 97% Physical Exam  Nursing note and vitals reviewed. Constitutional: She is oriented to person, place, and time. She appears well-developed and well-nourished. No distress.  HENT:  Head: Normocephalic and atraumatic.  Eyes: EOM are normal. Pupils are equal, round, and reactive to light.  Cardiovascular: Normal rate.   Pulmonary/Chest: Effort normal.  Musculoskeletal:       Right knee: She exhibits swelling. She exhibits normal range of motion, no ecchymosis, no deformity and no erythema. Tenderness found. Medial joint line, lateral joint line and patellar tendon tenderness noted.  Neurological: She is alert and oriented to person, place, and time.  Skin: Skin is warm and dry. No rash noted. No erythema.  Psychiatric: She has a normal mood and affect. Her behavior is normal.    ED Course  Procedures (including critical care time) Labs Review Labs Reviewed - No data to display  Imaging Review No results found.   EKG Interpretation None      MDM   Final diagnoses:  Knee sprain  Knee contusion   Pt with hx of chronic right knee complaints with multiple prior visits and injections by Ortho.  Patient presenting today with right knee pain that worsened today after a fall last night where she slipped getting out of the shower and fell on her knee. She has no other complaints at this time. Mild swelling of the knee but no redness, bruising or deformity. Low suspicion for dislocation.  Plain films pending. At one point patient reported some shortness of breath EMS however here she was questioned and denied any shortness of breath or chest pain.    Blanchie Dessert, MD 05/28/14 [phone]

## 2014-05-28 NOTE — Discharge Instructions (Signed)
Contusion  A contusion is a deep bruise. Contusions happen when an injury causes bleeding under the skin. Signs of bruising include pain, puffiness (swelling), and discolored skin. The contusion may turn blue, purple, or yellow.  HOME CARE   · Put ice on the injured area.  · Put ice in a plastic bag.  · Place a towel between your skin and the bag.  · Leave the ice on for 15-20 minutes, 03-04 times a day.  · Only take medicine as told by your doctor.  · Rest the injured area.  · If possible, raise (elevate) the injured area to lessen puffiness.  GET HELP RIGHT AWAY IF:   · You have more bruising or puffiness.  · You have pain that is getting worse.  · Your puffiness or pain is not helped by medicine.  MAKE SURE YOU:   · Understand these instructions.  · Will watch your condition.  · Will get help right away if you are not doing well or get worse.  Document Released: 05/20/2008 Document Revised: 02/24/2012 Document Reviewed: 10/07/2011  ExitCare® Patient Information ©2014 [employer].

## 2014-06-06 ENCOUNTER — Encounter (HOSPITAL_COMMUNITY): Payer: Self-pay | Admitting: Emergency Medicine

## 2014-06-06 ENCOUNTER — Emergency Department (HOSPITAL_COMMUNITY)
Admission: EM | Admit: 2014-06-06 | Discharge: 2014-06-06 | Discharge status: Against Medical Advice | Payer: Medicaid Other | Attending: Emergency Medicine | Admitting: Emergency Medicine

## 2014-06-06 ENCOUNTER — Emergency Department (HOSPITAL_COMMUNITY): Payer: Medicaid Other

## 2014-06-06 DIAGNOSIS — I10 Essential (primary) hypertension: Secondary | ICD-10-CM | POA: Insufficient documentation

## 2014-06-06 DIAGNOSIS — R110 Nausea: Secondary | ICD-10-CM | POA: Insufficient documentation

## 2014-06-06 DIAGNOSIS — J441 Chronic obstructive pulmonary disease with (acute) exacerbation: Secondary | ICD-10-CM | POA: Insufficient documentation

## 2014-06-06 DIAGNOSIS — J45901 Unspecified asthma with (acute) exacerbation: Secondary | ICD-10-CM | POA: Insufficient documentation

## 2014-06-06 DIAGNOSIS — E669 Obesity, unspecified: Secondary | ICD-10-CM | POA: Insufficient documentation

## 2014-06-06 DIAGNOSIS — I5032 Chronic diastolic (congestive) heart failure: Secondary | ICD-10-CM | POA: Insufficient documentation

## 2014-06-06 DIAGNOSIS — I252 Old myocardial infarction: Secondary | ICD-10-CM | POA: Insufficient documentation

## 2014-06-06 DIAGNOSIS — Z9889 Other specified postprocedural states: Secondary | ICD-10-CM | POA: Insufficient documentation

## 2014-06-06 DIAGNOSIS — R079 Chest pain, unspecified: Secondary | ICD-10-CM | POA: Insufficient documentation

## 2014-06-06 DIAGNOSIS — G8929 Other chronic pain: Secondary | ICD-10-CM | POA: Insufficient documentation

## 2014-06-06 LAB — BASIC METABOLIC PANEL
BUN: 9 mg/dL (ref 6–23)
CHLORIDE: 102 meq/L (ref 96–112)
CO2: 22 mEq/L (ref 19–32)
Calcium: 9.2 mg/dL (ref 8.4–10.5)
Creatinine, Ser: 0.69 mg/dL (ref 0.50–1.10)
Glucose, Bld: 143 mg/dL — ABNORMAL HIGH (ref 70–99)
Potassium: 3.6 mEq/L — ABNORMAL LOW (ref 3.7–5.3)
SODIUM: 141 meq/L (ref 137–147)

## 2014-06-06 LAB — CBC
HEMATOCRIT: 36.9 % (ref 36.0–46.0)
Hemoglobin: 12.2 g/dL (ref 12.0–15.0)
MCH: 27.8 pg (ref 26.0–34.0)
MCHC: 33.1 g/dL (ref 30.0–36.0)
MCV: 84.1 fL (ref 78.0–100.0)
Platelets: 315 10*3/uL (ref 150–400)
RBC: 4.39 MIL/uL (ref 3.87–5.11)
RDW: 14.7 % (ref 11.5–15.5)
WBC: 8.2 10*3/uL (ref 4.0–10.5)

## 2014-06-06 LAB — I-STAT TROPONIN, ED: TROPONIN I, POC: 0 ng/mL (ref 0.00–0.08)

## 2014-06-06 LAB — PRO B NATRIURETIC PEPTIDE: PRO B NATRI PEPTIDE: 255.4 pg/mL — AB (ref 0–125)

## 2014-06-06 NOTE — ED Notes (Addendum)
Patient with chest pain for the last three days, getting worse today.  Patient does have shortness of breath with the CP, patient does have COPD.  Patient does have some nausea, no vomiting.  Patient took one 325mg  ASA at home and EMS gave one sl Nitro with some relief.

## 2014-06-07 ENCOUNTER — Emergency Department (HOSPITAL_COMMUNITY): Payer: Medicaid Other

## 2014-06-07 ENCOUNTER — Encounter (HOSPITAL_COMMUNITY): Payer: Self-pay | Admitting: Emergency Medicine

## 2014-06-07 ENCOUNTER — Emergency Department (HOSPITAL_COMMUNITY)
Admission: EM | Admit: 2014-06-07 | Discharge: 2014-06-07 | Disposition: A | Discharge status: Home / Self Care | Payer: Medicaid Other | Attending: Emergency Medicine | Admitting: Emergency Medicine

## 2014-06-07 DIAGNOSIS — Z7982 Long term (current) use of aspirin: Secondary | ICD-10-CM | POA: Insufficient documentation

## 2014-06-07 DIAGNOSIS — I10 Essential (primary) hypertension: Secondary | ICD-10-CM | POA: Insufficient documentation

## 2014-06-07 DIAGNOSIS — M129 Arthropathy, unspecified: Secondary | ICD-10-CM | POA: Insufficient documentation

## 2014-06-07 DIAGNOSIS — Z87891 Personal history of nicotine dependence: Secondary | ICD-10-CM | POA: Insufficient documentation

## 2014-06-07 DIAGNOSIS — G8929 Other chronic pain: Secondary | ICD-10-CM | POA: Insufficient documentation

## 2014-06-07 DIAGNOSIS — I252 Old myocardial infarction: Secondary | ICD-10-CM | POA: Insufficient documentation

## 2014-06-07 DIAGNOSIS — Z79899 Other long term (current) drug therapy: Secondary | ICD-10-CM | POA: Insufficient documentation

## 2014-06-07 DIAGNOSIS — IMO0002 Reserved for concepts with insufficient information to code with codable children: Secondary | ICD-10-CM | POA: Insufficient documentation

## 2014-06-07 DIAGNOSIS — J4489 Other specified chronic obstructive pulmonary disease: Secondary | ICD-10-CM | POA: Insufficient documentation

## 2014-06-07 DIAGNOSIS — Z8719 Personal history of other diseases of the digestive system: Secondary | ICD-10-CM | POA: Insufficient documentation

## 2014-06-07 DIAGNOSIS — I5032 Chronic diastolic (congestive) heart failure: Secondary | ICD-10-CM | POA: Insufficient documentation

## 2014-06-07 DIAGNOSIS — Z9889 Other specified postprocedural states: Secondary | ICD-10-CM | POA: Insufficient documentation

## 2014-06-07 DIAGNOSIS — F411 Generalized anxiety disorder: Secondary | ICD-10-CM | POA: Insufficient documentation

## 2014-06-07 DIAGNOSIS — R079 Chest pain, unspecified: Secondary | ICD-10-CM

## 2014-06-07 DIAGNOSIS — J449 Chronic obstructive pulmonary disease, unspecified: Secondary | ICD-10-CM | POA: Insufficient documentation

## 2014-06-07 DIAGNOSIS — E039 Hypothyroidism, unspecified: Secondary | ICD-10-CM | POA: Insufficient documentation

## 2014-06-07 LAB — COMPREHENSIVE METABOLIC PANEL
ALT: 10 U/L (ref 0–35)
AST: 13 U/L (ref 0–37)
Albumin: 3.2 g/dL — ABNORMAL LOW (ref 3.5–5.2)
Alkaline Phosphatase: 86 U/L (ref 39–117)
BUN: 11 mg/dL (ref 6–23)
CHLORIDE: 102 meq/L (ref 96–112)
CO2: 23 mEq/L (ref 19–32)
Calcium: 8.9 mg/dL (ref 8.4–10.5)
Creatinine, Ser: 0.75 mg/dL (ref 0.50–1.10)
Glucose, Bld: 96 mg/dL (ref 70–99)
POTASSIUM: 4.1 meq/L (ref 3.7–5.3)
SODIUM: 139 meq/L (ref 137–147)
TOTAL PROTEIN: 6.3 g/dL (ref 6.0–8.3)
Total Bilirubin: 0.2 mg/dL — ABNORMAL LOW (ref 0.3–1.2)

## 2014-06-07 LAB — CBC
HEMATOCRIT: 35.7 % — AB (ref 36.0–46.0)
Hemoglobin: 11.6 g/dL — ABNORMAL LOW (ref 12.0–15.0)
MCH: 27.2 pg (ref 26.0–34.0)
MCHC: 32.5 g/dL (ref 30.0–36.0)
MCV: 83.6 fL (ref 78.0–100.0)
PLATELETS: 327 10*3/uL (ref 150–400)
RBC: 4.27 MIL/uL (ref 3.87–5.11)
RDW: 14.9 % (ref 11.5–15.5)
WBC: 6.3 10*3/uL (ref 4.0–10.5)

## 2014-06-07 LAB — TROPONIN I: Troponin I: <0.3 ng/mL (ref <0.30)

## 2014-06-07 MED ORDER — FAMOTIDINE 20 MG PO TABS
20.0000 mg | ORAL_TABLET | Freq: Once | ORAL | Status: AC
  Administered 2014-06-07: 20 mg via ORAL

## 2014-06-07 MED ORDER — GI COCKTAIL ~~LOC~~
30.0000 mL | Freq: Once | ORAL | Status: AC
  Administered 2014-06-07: 30 mL via ORAL

## 2014-06-07 NOTE — ED Notes (Signed)
Per pt, having sharp chest pain coming and going since last night.  Pt with cough nonproductive.  No fever.  Hx heart attack

## 2014-06-07 NOTE — Discharge Instructions (Signed)
Follow up with your doctor/cardiologist in the next couple days.  Return to ER if worse, persistent/recurrent chest pain, trouble breathing, other concern.    Chest Pain (Nonspecific) It is often hard to give a specific diagnosis for the cause of chest pain. There is always a chance that your pain could be related to something serious, such as a heart attack or a blood clot in the lungs. You need to follow up with your health care provider for further evaluation. CAUSES   Heartburn.  Pneumonia or bronchitis.  Anxiety or stress.  Inflammation around your heart (pericarditis) or lung (pleuritis or pleurisy).  A blood clot in the lung.  A collapsed lung (pneumothorax). It can develop suddenly on its own (spontaneous pneumothorax) or from trauma to the chest.  Shingles infection (herpes zoster virus). The chest wall is composed of bones, muscles, and cartilage. Any of these can be the source of the pain.  The bones can be bruised by injury.  The muscles or cartilage can be strained by coughing or overwork.  The cartilage can be affected by inflammation and become sore (costochondritis). DIAGNOSIS  Lab tests or other studies may be needed to find the cause of your pain. Your health care provider may have you take a test called an ambulatory electrocardiogram (ECG). An ECG records your heartbeat patterns over a 24-hour period. You may also have other tests, such as:  Transthoracic echocardiogram (TTE). During echocardiography, sound waves are used to evaluate how blood flows through your heart.  Transesophageal echocardiogram (TEE).  Cardiac monitoring. This allows your health care provider to monitor your heart rate and rhythm in real time.  Holter monitor. This is a portable device that records your heartbeat and can help diagnose heart arrhythmias. It allows your health care provider to track your heart activity for several days, if needed.  Stress tests by exercise or by giving  medicine that makes the heart beat faster. TREATMENT   Treatment depends on what may be causing your chest pain. Treatment may include:  Acid blockers for heartburn.  Anti-inflammatory medicine.  Pain medicine for inflammatory conditions.  Antibiotics if an infection is present.  You may be advised to change lifestyle habits. This includes stopping smoking and avoiding alcohol, caffeine, and chocolate.  You may be advised to keep your head raised (elevated) when sleeping. This reduces the chance of acid going backward from your stomach into your esophagus. Most of the time, nonspecific chest pain will improve within 2-3 days with rest and mild pain medicine.  HOME CARE INSTRUCTIONS   If antibiotics were prescribed, take them as directed. Finish them even if you start to feel better.  For the next few days, avoid physical activities that bring on chest pain. Continue physical activities as directed.  Do not use any tobacco products, including cigarettes, chewing tobacco, or electronic cigarettes.  Avoid drinking alcohol.  Only take medicine as directed by your health care provider.  Follow your health care provider's suggestions for further testing if your chest pain does not go away.  Keep any follow-up appointments you made. If you do not go to an appointment, you could develop lasting (chronic) problems with pain. If there is any problem keeping an appointment, call to reschedule. SEEK MEDICAL CARE IF:   Your chest pain does not go away, even after treatment.  You have a rash with blisters on your chest.  You have a fever. SEEK IMMEDIATE MEDICAL CARE IF:   You have increased chest pain or  pain that spreads to your arm, neck, jaw, back, or abdomen.  You have shortness of breath.  You have an increasing cough, or you cough up blood.  You have severe back or abdominal pain.  You feel nauseous or vomit.  You have severe weakness.  You faint.  You have  chills. This is an emergency. Do not wait to see if the pain will go away. Get medical help at once. Call your local emergency services (911 in [place] Do not drive yourself to the hospital. MAKE SURE YOU:   Understand these instructions.  Will watch your condition.  Will get help right away if you are not doing well or get worse. Document Released: 09/11/2005 Document Revised: 12/07/2013 Document Reviewed: 07/07/2008 [facility] Patient Information 2015 [place], [place]. This information is not intended to replace advice given to you by your health care provider. Make sure you discuss any questions you have with your health care provider.

## 2014-06-07 NOTE — ED Notes (Signed)
Pt ambulated to the BR with steady gait.   

## 2014-06-07 NOTE — ED Provider Notes (Signed)
CSN: [id number]     Arrival date & time 06/07/14  1619 History   First MD Initiated Contact with Patient 06/07/14 1713     Chief Complaint  Patient presents with  . Chest Pain     (Consider location/radiation/quality/duration/timing/severity/associated sxs/prior Treatment) Patient is a 58 y.o. female presenting with chest pain. The history is provided by the patient.  Chest Pain Associated symptoms: cough   Associated symptoms: no abdominal pain, no back pain, no fever, no headache, no nausea, no palpitations, no shortness of breath and not vomiting   pt with hx recurrent cp, prior normal cardiac cath, presents w cp in past 2 days. Episodic, at rest. Mid chest. Lasts 1-2 minutes. No associated nv, diaphoresis or sob. No constant or pleuritic cp. No exertional cp or discomfort. Denies recent unusual doe or fatigue. Denies gerd. Non productive cough, chronic, no recent change. +smoker. Denies fever or chills. No recent immobility, surgery, trauma or travel. No leg pain or swelling. Denies chest wall injury or strain.    Past Medical History  Diagnosis Date  . Chest pain     a. s/p reported MI in 1997;  b. 09/2012 Cath: nl cors, nl LV ([place]);  c. 05/2012 Non-ischemic myoview.  . Hypertension   . Arthritis   . Problem with literacy   . Shortness of breath   . Anxiety   . Bronchitis   . Chronic headaches   . Obesity   . COPD (chronic obstructive pulmonary disease)   . Chronic diastolic CHF (congestive heart failure)     a. 12/2011 Echo: nl ef, Gr 1 DD;  b. 11/2012 Echo: EF 45-50%, no significant valvular abnormalities.  Marland Kitchen GERD (gastroesophageal reflux disease)   . Myocardial infarction 1997    self reported  . Hypothyroidism   . Asthma   . Chronic chest pain    Past Surgical History  Procedure Laterality Date  . Ectopic pregnancy surgery      right  . Nm myoview ltd  June 2013    no reversible ischemia  . Cardiac catheterization  October 2012    No coronary artery disease   . Cardiac catheterization  2012    normal coronary arteries  . Nm myoview ltd  2012, 2013    normal  . 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 Knee arthroscopy  08/13/13    right   Family History  Problem Relation Age of Onset  . Cancer Mother 59    lung ca  . Diabetes Mother   . Anxiety disorder Sister   . Cancer Brother 60    lung  . Colon cancer Neg Hx   . Pancreatic cancer Neg Hx   . Rectal cancer Neg Hx   . Stomach cancer Neg Hx    History  Substance Use Topics  . Smoking status: Former Smoker -- 1.00 packs/day for 3 years    Quit date: 12/03/2010  . Smokeless tobacco: Never Used  . Alcohol Use: No   OB History   Grav Para Term Preterm Abortions TAB SAB Ect Mult Living                 Review of Systems  Constitutional: Negative for fever and chills.  HENT: Negative for sore throat.   Eyes: Negative for redness.  Respiratory: Positive for cough. Negative for shortness of breath.   Cardiovascular: Positive for chest pain. Negative for palpitations and leg swelling.  Gastrointestinal: Negative for nausea, vomiting and abdominal pain.  Genitourinary: Negative for flank pain.  Musculoskeletal: Negative for back pain and neck pain.  Skin: Negative for rash.  Neurological: Negative for headaches.  Hematological: Does not bruise/bleed easily.  Psychiatric/Behavioral: Negative for confusion.      Allergies  Darvocet; Ibuprofen; Oxycodone; Tramadol; Tylenol; Celebrex; Naproxen; and Penicillins  Home Medications   Prior to Admission medications   Medication Sig Start Date End Date Taking? Authorizing Mylon Mabey  aspirin EC 81 MG tablet Take 81 mg by mouth daily.   Yes Historical Delberta Folts, MD  atorvastatin (LIPITOR) 40 MG tablet Take 40 mg by mouth daily.   Yes Historical Susa Bones, MD  beclomethasone (QVAR) 40 MCG/ACT inhaler Inhale 2 puffs into the lungs 2 (two) times daily.   Yes Historical Sommer Spickard, MD  levothyroxine (SYNTHROID,  LEVOTHROID) 75 MCG tablet Take 75 mcg by mouth daily before breakfast.   Yes Historical Keslyn Teater, MD  metoprolol succinate (TOPROL-XL) 50 MG 24 hr tablet Take 50 mg by mouth daily. Take with or immediately following a meal.   Yes Historical Aliciana Ricciardi, MD  PARoxetine (PAXIL) 20 MG tablet Take 1 tablet (20 mg total) by mouth daily. 03/15/14  Yes Andrena Mews, MD   BP 120/70  Pulse 89  Temp(Src) 98.1 F (36.7 C) (Oral)  Resp 16  SpO2 95% Physical Exam  Nursing note and vitals reviewed. Constitutional: She is oriented to person, place, and time. She appears well-developed and well-nourished. No distress.  HENT:  Mouth/Throat: Oropharynx is clear and moist.  Eyes: Conjunctivae are normal. No scleral icterus.  Neck: Neck supple. No tracheal deviation present.  Cardiovascular: Normal rate, regular rhythm, normal heart sounds and intact distal pulses.  Exam reveals no gallop and no friction rub.   No murmur heard. Pulmonary/Chest: Effort normal and breath sounds normal. No respiratory distress. She exhibits no tenderness.  Abdominal: Soft. Normal appearance and bowel sounds are normal. She exhibits no distension. There is no tenderness.  Musculoskeletal: She exhibits no edema and no tenderness.  Neurological: She is alert and oriented to person, place, and time.  Skin: Skin is warm and dry. No rash noted. She is not diaphoretic.  Psychiatric: She has a normal mood and affect.    ED Course  Procedures (including critical care time) Labs Review   Results for orders placed during the hospital encounter of 06/07/14  CBC      Result Value Ref Range   WBC 6.3  4.0 - 10.5 K/uL   RBC 4.27  3.87 - 5.11 MIL/uL   Hemoglobin 11.6 (*) 12.0 - 15.0 g/dL   HCT 35.7 (*) 36.0 - 46.0 %   MCV 83.6  78.0 - 100.0 fL   MCH 27.2  26.0 - 34.0 pg   MCHC 32.5  30.0 - 36.0 g/dL   RDW 14.9  11.5 - 15.5 %   Platelets 327  150 - 400 K/uL  COMPREHENSIVE METABOLIC PANEL      Result Value Ref Range   Sodium 139   137 - 147 mEq/L   Potassium 4.1  3.7 - 5.3 mEq/L   Chloride 102  96 - 112 mEq/L   CO2 23  19 - 32 mEq/L   Glucose, Bld 96  70 - 99 mg/dL   BUN 11  6 - 23 mg/dL   Creatinine, Ser 0.75  0.50 - 1.10 mg/dL   Calcium 8.9  8.4 - 10.5 mg/dL   Total Protein 6.3  6.0 - 8.3 g/dL   Albumin 3.2 (*)  3.5 - 5.2 g/dL   AST 13  0 - 37 U/L   ALT 10  0 - 35 U/L   Alkaline Phosphatase 86  39 - 117 U/L   Total Bilirubin 0.2 (*) 0.3 - 1.2 mg/dL   GFR calc non Af Amer >90  >90 mL/min   GFR calc Af Amer >90  >90 mL/min  TROPONIN I      Result Value Ref Range   Troponin I <0.30  <0.30 ng/mL   Dg Chest 2 View  06/07/2014   CLINICAL DATA:  Chest pain  EXAM: CHEST  2 VIEW  COMPARISON:  06/06/2014; 05/23/2014  FINDINGS: Grossly unchanged borderline enlarged cardiac silhouette and mediastinal contours given slightly reduced lung volumes. There is grossly unchanged mild diffuse slightly nodular thickening of the pulmonary interstitium. There is minimal pleural parenchymal thickening about the right minor and the bilateral major fissures. No discrete focal airspace opacities. No pleural effusion or pneumothorax. No evidence of edema. No acute osseus abnormalities.  IMPRESSION: Mild bronchitic change without acute cardiopulmonary disease.   Electronically Signed   By: Sandi Mariscal M.D.   On: 06/07/2014 18:18   Dg Chest 2 View  06/06/2014   CLINICAL DATA:  Mid chest pain and shortness of breath today. Back pain after a fall 2 days ago.  EXAM: CHEST  2 VIEW  COMPARISON:  05/23/2014  FINDINGS: The heart size and mediastinal contours are within normal limits. Both lungs are clear. The visualized skeletal structures are unremarkable.  IMPRESSION: No active cardiopulmonary disease.   Electronically Signed   By: Lucienne Capers M.D.   On: 06/06/2014 22:14   Dg Chest 2 View  05/23/2014   CLINICAL DATA:  Shortness of breath, palpitations  EXAM: CHEST  2 VIEW  COMPARISON:  03/25/2014  FINDINGS: Mild left basilar opacity, likely  atelectasis correlate with lateral view. No focal consolidation. No pleural effusion or pneumothorax.  The heart is top normal in size.  Mild degenerative changes of the visualized thoracolumbar spine.  IMPRESSION: No evidence of acute cardiopulmonary disease.   Electronically Signed   By: Julian Hy M.D.   On: 05/23/2014 20:00   Dg Knee Complete 4 Views Right  05/29/2014   CLINICAL DATA:  Right knee pain and swelling.  EXAM: RIGHT KNEE - COMPLETE 4+ VIEW  COMPARISON:  Right knee radiographs performed 04/03/2014  FINDINGS: There is no evidence of fracture or dislocation. The joint spaces are preserved. Marginal osteophytes are noted at the medial compartment; the patellofemoral joint is grossly unremarkable in appearance.  A moderate knee joint effusion is noted. The visualized soft tissues are otherwise unremarkable in appearance.  IMPRESSION: 1. No evidence of fracture or dislocation. 2. Moderate knee joint effusion noted. If the patient's symptoms persist, MRI could be considered for further evaluation, to exclude internal derangement of the knee.   Electronically Signed   By: Garald Balding M.D.   On: 05/29/2014 00:08      EKG Interpretation   Date/Time:  Tuesday June 07 2014 16:29:17 EDT Ventricular Rate:  88 PR Interval:  139 QRS Duration: 78 QT Interval:  364 QTC Calculation: 440 R Axis:   27 Text Interpretation:  Sinus rhythm Borderline low voltage, extremity leads  Baseline wander in lead(s) V1 No significant change since last tracing  Confirmed by Ashok Cordia  MD, Lennette Bihari ([id number]) on 06/07/2014 5:40:37 PM      MDM  Iv ns. Labs. Cxr. Ecg.  Reviewed nursing notes and prior charts for additional history.  pepcid and gi cocktail po.  Reviewed prior charts  - pts prior cardiac cath for recurrent cp w normal coronaries.  Pt w v atypical chest pain x >24 hrs, after which trop neg.  Prior cath normal.   Recheck pt comfortable, drinking fluids, appears stable for d/c.     Mirna Mires, MD 06/07/14 Joen Laura

## 2014-06-08 ENCOUNTER — Emergency Department (HOSPITAL_COMMUNITY)
Admission: EM | Admit: 2014-06-08 | Discharge: 2014-06-09 | Disposition: A | Discharge status: Home / Self Care | Payer: Medicaid Other | Attending: Emergency Medicine | Admitting: Emergency Medicine

## 2014-06-08 ENCOUNTER — Emergency Department (HOSPITAL_COMMUNITY): Payer: Medicaid Other

## 2014-06-08 ENCOUNTER — Encounter (HOSPITAL_COMMUNITY): Payer: Self-pay | Admitting: Emergency Medicine

## 2014-06-08 DIAGNOSIS — I10 Essential (primary) hypertension: Secondary | ICD-10-CM | POA: Insufficient documentation

## 2014-06-08 DIAGNOSIS — Z880 Allergy status to penicillin: Secondary | ICD-10-CM | POA: Insufficient documentation

## 2014-06-08 DIAGNOSIS — Z8719 Personal history of other diseases of the digestive system: Secondary | ICD-10-CM | POA: Insufficient documentation

## 2014-06-08 DIAGNOSIS — M129 Arthropathy, unspecified: Secondary | ICD-10-CM | POA: Insufficient documentation

## 2014-06-08 DIAGNOSIS — I5032 Chronic diastolic (congestive) heart failure: Secondary | ICD-10-CM | POA: Insufficient documentation

## 2014-06-08 DIAGNOSIS — Y9389 Activity, other specified: Secondary | ICD-10-CM | POA: Insufficient documentation

## 2014-06-08 DIAGNOSIS — S93601A Unspecified sprain of right foot, initial encounter: Secondary | ICD-10-CM

## 2014-06-08 DIAGNOSIS — Z9889 Other specified postprocedural states: Secondary | ICD-10-CM | POA: Insufficient documentation

## 2014-06-08 DIAGNOSIS — S8391XA Sprain of unspecified site of right knee, initial encounter: Secondary | ICD-10-CM

## 2014-06-08 DIAGNOSIS — S93609A Unspecified sprain of unspecified foot, initial encounter: Secondary | ICD-10-CM | POA: Insufficient documentation

## 2014-06-08 DIAGNOSIS — J4489 Other specified chronic obstructive pulmonary disease: Secondary | ICD-10-CM | POA: Insufficient documentation

## 2014-06-08 DIAGNOSIS — Z7982 Long term (current) use of aspirin: Secondary | ICD-10-CM | POA: Insufficient documentation

## 2014-06-08 DIAGNOSIS — J449 Chronic obstructive pulmonary disease, unspecified: Secondary | ICD-10-CM | POA: Insufficient documentation

## 2014-06-08 DIAGNOSIS — E669 Obesity, unspecified: Secondary | ICD-10-CM | POA: Insufficient documentation

## 2014-06-08 DIAGNOSIS — Z87891 Personal history of nicotine dependence: Secondary | ICD-10-CM | POA: Insufficient documentation

## 2014-06-08 DIAGNOSIS — I252 Old myocardial infarction: Secondary | ICD-10-CM | POA: Insufficient documentation

## 2014-06-08 DIAGNOSIS — IMO0002 Reserved for concepts with insufficient information to code with codable children: Secondary | ICD-10-CM | POA: Insufficient documentation

## 2014-06-08 DIAGNOSIS — W108XXA Fall (on) (from) other stairs and steps, initial encounter: Secondary | ICD-10-CM | POA: Insufficient documentation

## 2014-06-08 DIAGNOSIS — F411 Generalized anxiety disorder: Secondary | ICD-10-CM | POA: Insufficient documentation

## 2014-06-08 DIAGNOSIS — Z79899 Other long term (current) drug therapy: Secondary | ICD-10-CM | POA: Insufficient documentation

## 2014-06-08 DIAGNOSIS — Y929 Unspecified place or not applicable: Secondary | ICD-10-CM | POA: Insufficient documentation

## 2014-06-08 DIAGNOSIS — G8929 Other chronic pain: Secondary | ICD-10-CM | POA: Insufficient documentation

## 2014-06-08 DIAGNOSIS — E039 Hypothyroidism, unspecified: Secondary | ICD-10-CM | POA: Insufficient documentation

## 2014-06-08 NOTE — ED Notes (Signed)
ON arrival to tr 7 pt reported her foot now hurst 9/10 and pt reports RT foot is now swollen. X-ray of foot ordered.

## 2014-06-08 NOTE — ED Notes (Signed)
Pt reports that she tripped on stairs this morning. PT now reports 10/10 pain to right knee, pain worse with ambulation. Full ROM. Pulses/sensation intact. Denies taking anything for pain.

## 2014-06-09 MED ORDER — OXYCODONE-ACETAMINOPHEN 5-325 MG PO TABS: 1.0000 | ORAL_TABLET | Freq: Three times a day (TID) | ORAL | Status: DC | PRN

## 2014-06-09 NOTE — Discharge Instructions (Signed)
Please call your doctor for a followup appointment within 24-48 hours. When you talk to your doctor please let them know that you were seen in the emergency department and have them acquire all of your records so that they can discuss the findings with you and formulate a treatment plan to fully care for your new and ongoing problems. Please call and set-up an appointment with your primary care provider and orthopedics Please keep knee in immobilizer when walking and active Please rest, ice, elevate - toes above nose Please avoid any physical or strenuous activity Please take pain medications as prescribed - while on pain medications there is to be no drinking alcohol, driving, operating any heavy machinery if there is extra please dispose in a proper manner. Please do not take any extra Tylenol for this can lead to Tylenol overdose and liver issues.  Please continue to monitor symptoms closely and if symptoms are to worsen or change (fever greater than 101, chills, chest pain, shortness of breath, difficulty breathing, fall, swelling, redness, hot to touch) please report back to the ED immediately   Combined Knee Ligament Sprain Combined knee ligament sprain is a tear of more than one of the major ligaments of the knee. The four knee ligaments are the anterior cruciate ligament (ACL), posterior cruciate ligament (PCL), medial collateral ligament (MCL) and lateral collateral ligament (LCL). Ligaments connect bones. They often cross a joint to hold the bones together. The ligaments of the knee keep the thigh bone (femur) and shinbone (tibia) in alignment. These ligaments allow the joint to move within a certain range of motion. Movement outside this range causes a ligament strain. Injury to multiple ligaments at the same time results in difficulty playing sports and in daily living. The most common multiple knee ligament injury involves the ACL and MCL. SYMPTOMS   A "popping" sound heard or felt at the  time of injury.  Inability to continue activity after injury.  Inflammation of the knee within 6 hours after injury.  Possibly, deformity of the knee.  Inability to straighten the knee.  Feeling of the knee giving way or buckling.  Sometimes, locking of the knee, if the joint cartilage (meniscus) is injured.  Rarely, numbness, weakness, paralysis, discoloration, or coldness, due to nerve or blood vessel injury. CAUSES  Spraining of multiple ligaments occurs when a force is placed on the ligaments that exceeds their strength. This is often caused by a direct hit (trauma). It may also be caused by a non-contact injury (hyperextending the knee while twisting it).  RISK INCREASES WITH:  Contact sports (football, rugby, lacrosse). Sports that involve pivoting, jumping, cutting, or changing direction (basketball, gymnastics, soccer, volleyball). Sports on uneven ground (cross-country running, soccer).  Poor strength and/or flexibility.  Improper fitted or padded equipment. PREVENTION  Warm up and stretch properly before activity.  Maintain physical fitness:  Thigh, leg, and knee flexibility.  Muscle strength and endurance.  Learn and use proper exercise technique.  Wear proper and well fitting equipment (correct length of cleats for surface). PROGNOSIS  Without treatment, the knee will continue to give way and become vulnerable to recurring injury. Recurring injury can happen during athletics or daily living. If the injury includes damage to a nerve or artery, the chance of a poor outcome increases. Surgery is often needed to regain stability of the knee. RELATED COMPLICATIONS  Frequently recurring symptoms, including:  Knee giving way.  Joint instability.  Inflammation.  Injury to the joint cartilage (meniscus). This may result  in locking and/or swelling of the knee.  Injury to joint (articular) cartilage of the thigh bone or shinbone. This may result in arthritis of the  knee.  Injury to other ligaments of the knee.  Knee stiffness (loss of knee motion).  Permanent injury to nerves (numbness, weakness, or paralysis) or arteries.  Removal (amputation) of the leg, due to nerve or artery injury. TREATMENT  Treatment first involves medicine and ice, to reduce pain and inflammation. Crutches may be advised, to decrease pain while walking. The knee may be restrained. Rehabilitation focuses on reducing swelling, regaining range of motion, and regaining muscle control and strength. It may also include receiving proper use training, wearing a brace, and education. (Avoid sports that involve pivoting, cutting, changing direction, jumping and landing). Surgery often offers the best chance for full recovery. Surgery from combined ACL/MCL injury involves replacement (reconstruction) of the ACL. This also allows for MCL healing. Despite surgery, some athletes may never return to their prior level of competition. The ability to return to sports depends on the related injuries and demands of the sport.  MEDICATION   If pain medicine is needed, nonsteroidal anti-inflammatory medicines (aspirin and ibuprofen), or other minor pain relievers (acetaminophen), are often advised.  Do not take pain medicine for 7 days before surgery.  Stronger pain relievers may be prescribed. Use only as directed and only as much as you need.  Contact your caregiver immediately if any bleeding, stomach upset, or signs of an allergic reaction occur. COLD THERAPY  Cold treatment (icing) should be applied for 10 to 15 minutes every 2 to 3 hours for inflammation and pain, and immediately after activity that aggravates your symptoms. Use ice packs or an ice massage. SEEK MEDICAL CARE IF:   Symptoms get worse or do not improve in 6 weeks, despite treatment.  After injury or surgery, any of the following occur:  Pain, numbness, coldness, or a blue, gray, or dark color occurs in the foot or  toenails.  Increased pain, swelling, redness, drainage of fluids, or bleeding in the affected area.  Signs of infection (headache, muscle aches, dizziness, or a general ill feeling with fever).  New, unexplained symptoms develop. (Drugs used in treatment may produce side effects.) Document Released: 12/02/2005 Document Revised: 02/24/2012 Document Reviewed: 03/16/2009 [facility] Patient Information 2015 [place], [place]. This information is not intended to replace advice given to you by your health care provider. Make sure you discuss any questions you have with your health care provider.

## 2014-06-09 NOTE — ED Notes (Signed)
D/c instructions reviewed w/ pt and family - pt and family deny any further questions or concerns at present. Rx given x1  

## 2014-06-09 NOTE — Progress Notes (Signed)
Orthopedic Tech Progress Note Patient Details:  [name] Jul 16, 1956 [id number]  Ortho Devices Type of Ortho Device: Crutches;Knee Immobilizer   [name] 06/09/2014, 1:02 AM

## 2014-06-09 NOTE — ED Provider Notes (Signed)
CSN: [id number]     Arrival date & time 06/08/14  2039 History   First MD Initiated Contact with Patient 06/08/14 2338     Chief Complaint  Patient presents with  . Knee Pain     (Consider location/radiation/quality/duration/timing/severity/associated sxs/prior Treatment) The history is provided by the patient. No language interpreter was used.  [name] is a 58 y/o F with PMHX of chest pain, hypertension, asthma, arthritis, shortness of breath, anxiety, bronchitis, GERD, MI, hypothyroidism presenting to the ED with right knee pain that started today at approximately 12:00 AM-reported that patient fell down the steps approximately 3 steps. Stated that she thinks that she twisted her right knee-described the discomfort as a shooting pain without radiation. Stated that she started to experience right foot pain as well described as a sharp pain without radiation. Stated that she's been taking Tylenol with minimal relief. Reported that applying pressure to the right lower extremity makes the pain worse. Stated that she has had to have a joint aspiration of the right knee couple of years ago. Denied chest pain, shortness of breath, difficulty breathing, head injury, loss of consciousness, numbness, tingling, weakness, blurred vision. PCP Dr. Gwendlyn Deutscher  Past Medical History  Diagnosis Date  . Chest pain     a. s/p reported MI in 1997;  b. 09/2012 Cath: nl cors, nl LV ([place]);  c. 05/2012 Non-ischemic myoview.  . Hypertension   . Arthritis   . Problem with literacy   . Shortness of breath   . Anxiety   . Bronchitis   . Chronic headaches   . Obesity   . COPD (chronic obstructive pulmonary disease)   . Chronic diastolic CHF (congestive heart failure)     a. 12/2011 Echo: nl ef, Gr 1 DD;  b. 11/2012 Echo: EF 45-50%, no significant valvular abnormalities.  Marland Kitchen GERD (gastroesophageal reflux disease)   . Myocardial infarction 1997    self reported  . Hypothyroidism   . Asthma   . Chronic  chest pain    Past Surgical History  Procedure Laterality Date  . Ectopic pregnancy surgery      right  . Nm myoview ltd  June 2013    no reversible ischemia  . Cardiac catheterization  October 2012    No coronary artery disease  . Cardiac catheterization  2012    normal coronary arteries  . Nm myoview ltd  2012, 2013    normal  . 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 Knee arthroscopy  08/13/13    right   Family History  Problem Relation Age of Onset  . Cancer Mother 67    lung ca  . Diabetes Mother   . Anxiety disorder Sister   . Cancer Brother 60    lung  . Colon cancer Neg Hx   . Pancreatic cancer Neg Hx   . Rectal cancer Neg Hx   . Stomach cancer Neg Hx    History  Substance Use Topics  . Smoking status: Former Smoker -- 1.00 packs/day for 3 years    Quit date: 12/03/2010  . Smokeless tobacco: Never Used  . Alcohol Use: No   OB History   Grav Para Term Preterm Abortions TAB SAB Ect Mult Living                 Review of Systems  Constitutional: Negative for fever and chills.  Respiratory: Negative for chest tightness and  shortness of breath.   Cardiovascular: Negative for chest pain.  Musculoskeletal: Positive for arthralgias (right foot and knee pain ).  Skin: Negative for color change and rash.  Neurological: Negative for dizziness, weakness, numbness and headaches.      Allergies  Darvocet; Ibuprofen; Oxycodone; Tramadol; Tylenol; Celebrex; Naproxen; and Penicillins  Home Medications   Prior to Admission medications   Medication Sig Start Date End Date Taking? Authorizing Stasha Naraine  aspirin EC 81 MG tablet Take 81 mg by mouth daily.    Historical Jay Kempe, MD  atorvastatin (LIPITOR) 40 MG tablet Take 40 mg by mouth daily.    Historical Brenya Taulbee, MD  beclomethasone (QVAR) 40 MCG/ACT inhaler Inhale 2 puffs into the lungs 2 (two) times daily.    Historical Hollyn Stucky, MD  levothyroxine (SYNTHROID, LEVOTHROID) 75 MCG  tablet Take 75 mcg by mouth daily before breakfast.    Historical Andoni Busch, MD  metoprolol succinate (TOPROL-XL) 50 MG 24 hr tablet Take 50 mg by mouth daily. Take with or immediately following a meal.    Historical Masashi Snowdon, MD  oxyCODONE-acetaminophen (PERCOCET/ROXICET) 5-325 MG per tablet Take 1 tablet by mouth every 8 (eight) hours as needed for moderate pain or severe pain. 06/09/14   Marissa Sciacca, PA-C  PARoxetine (PAXIL) 20 MG tablet Take 1 tablet (20 mg total) by mouth daily. 03/15/14   Andrena Mews, MD   BP 127/83  Pulse 87  Temp(Src) 99.2 F (37.3 C) (Oral)  Resp 22  Ht 5\' 8"  (1.727 m)  Wt 214 lb (97.07 kg)  BMI 32.55 kg/m2  SpO2 95% Physical Exam  Nursing note and vitals reviewed. Constitutional: She is oriented to person, place, and time. She appears well-developed and well-nourished. No distress.  HENT:  Head: Normocephalic and atraumatic.  Eyes: Conjunctivae and EOM are normal. Pupils are equal, round, and reactive to light. Right eye exhibits no discharge. Left eye exhibits no discharge.  Neck: Normal range of motion. Neck supple. No tracheal deviation present.  Cardiovascular: Normal rate, regular rhythm and normal heart sounds.  Exam reveals no friction rub.   No murmur heard. Pulses:      Radial pulses are 2+ on the right side, and 2+ on the left side.       Dorsalis pedis pulses are 2+ on the right side, and 2+ on the left side.       Posterior tibial pulses are 2+ on the right side.  Cap refill less than 3 seconds  Pulmonary/Chest: Effort normal and breath sounds normal. No respiratory distress. She has no wheezes. She has no rales.  Abdominal:  Obese  Musculoskeletal: Normal range of motion. She exhibits edema and tenderness.       Right knee: She exhibits swelling. She exhibits normal range of motion, no effusion, no ecchymosis, no deformity, no laceration, no erythema, normal alignment and no LCL laxity. Tenderness found. Medial joint line and MCL tenderness  noted.       Legs: Mild swelling identified to the right knee with negative erythema, inflammation, lesions, sores, deformities, warmth upon palpation. Discomfort upon palpation to the medial aspect of the right knee. Full flexion extension identified-discomfort with flexion of the right knee. Negative crepitus noted. Full range of motion to the right ankle without difficulty. Discomfort upon palpation to the dorsal aspect of the right foot with negative deformities identified-negative ecchymosis, lesions, sores, warmth upon palpation. Full range of motion to the digits of the right foot without difficulty. Full ROM to upper and lower extremities without difficulty noted,  negative ataxia noted.  Lymphadenopathy:    She has no cervical adenopathy.  Neurological: She is alert and oriented to person, place, and time. No cranial nerve deficit. She exhibits normal muscle tone. Coordination normal.  Cranial nerves III-XII grossly intact Strength 5+/5+ to lower extremities bilaterally with resistance applied, equal distribution noted Strength intact to the digits of the left foot  Negative arm drift Gait proper, proper balance - negative sway, negative drift, negative step-offs  Skin: Skin is warm and dry. No rash noted. She is not diaphoretic. No erythema.  Psychiatric: She has a normal mood and affect. Her behavior is normal. Thought content normal.    ED Course  Procedures (including critical care time) Labs Review Labs Reviewed - No data to display  Imaging Review Dg Chest 2 View  06/07/2014   CLINICAL DATA:  Chest pain  EXAM: CHEST  2 VIEW  COMPARISON:  06/06/2014; 05/23/2014  FINDINGS: Grossly unchanged borderline enlarged cardiac silhouette and mediastinal contours given slightly reduced lung volumes. There is grossly unchanged mild diffuse slightly nodular thickening of the pulmonary interstitium. There is minimal pleural parenchymal thickening about the right minor and the bilateral major  fissures. No discrete focal airspace opacities. No pleural effusion or pneumothorax. No evidence of edema. No acute osseus abnormalities.  IMPRESSION: Mild bronchitic change without acute cardiopulmonary disease.   Electronically Signed   By: Sandi Mariscal M.D.   On: 06/07/2014 18:18   Dg Knee Complete 4 Views Right  06/08/2014   CLINICAL DATA:  Knee pain.  EXAM: RIGHT KNEE - COMPLETE 4+ VIEW  COMPARISON:  05/28/2014.  FINDINGS: The E joint spaces are fairly well maintained. There are mild tricompartmental degenerative changes but no acute bony findings or osteochondral lesion. A small joint effusion is noted.  IMPRESSION: Mild degenerative changes but no acute bony findings.  Small joint effusion.   Electronically Signed   By: Kalman Jewels M.D.   On: 06/08/2014 21:16   Dg Foot Complete Right  06/09/2014   CLINICAL DATA:  Recent fall.  EXAM: RIGHT FOOT COMPLETE - 3+ VIEW  COMPARISON:  Prior radiograph from 03/25/2014  FINDINGS: No acute fracture or dislocation. Joint spaces are maintained. Osseous mineralization within normal limits. No significant degenerative changes identified.  Soft tissues are within normal limits.  No radiopaque foreign body.  IMPRESSION: No acute abnormality about the right foot.   Electronically Signed   By: Jeannine Boga M.D.   On: 06/09/2014 00:03     EKG Interpretation None      MDM   Final diagnoses:  Right knee sprain, initial encounter  Right foot sprain, initial encounter    Filed Vitals:   06/08/14 2045  BP: 127/83  Pulse: 87  Temp: 99.2 F (37.3 C)  TempSrc: Oral  Resp: 22  Height: 5\' 8"  (1.727 m)  Weight: 214 lb (97.07 kg)  SpO2: 95%   Right foot plain film negative for acute process injury. Right knee plain film noted mild degenerative changes but no acute bony abnormalities, small joint effusion noted. When asked about pain medications-patient reported that she's taken Percocet and Vicodin in the past without any complications. Doubt  septic joint. Doubt compartment syndrome. Doubt ischemia. Suspicion to be possible knee sprain-cannot rule out possible ligamental injury. Negative focal neurological deficits noted. Patient neurovascularly intact. Pulses palpable and strong -- DP and PT to the right lower extremity. Sensation intact. Strength intact with equal distribution. Plain films unremarkable. Gait noted with limp to the right lower extremity when  pressure applied to the right lower extremity. Patient to be placed in knee immobilizer and given crutches, patient refused stated that she has these at home. Patient stable, afebrile. Discharged patient. Discharged patient with pain medications-discussed course, cautions, disposal technique. Referred patient to primary care Malynn Lucy and orthopedics. Discussed with patient to rest, ice, elevate. Discussed with patient the importance of wearing the immobilizer and nonweightbearing. Discussed with patient to closely monitor symptoms and if symptoms are to worsen or change to report back to the ED - strict return instructions given.  Patient agreed to plan of care, understood, all questions answered.    Jamse Mead, PA-C 06/09/14 [phone]

## 2014-06-10 ENCOUNTER — Ambulatory Visit: Payer: Medicaid Other | Admitting: Internal Medicine

## 2014-06-10 NOTE — ED Provider Notes (Signed)
Medical screening examination/treatment/procedure(s) were performed by non-physician practitioner and as supervising physician I was immediately available for consultation/collaboration.   EKG Interpretation None      Rolland Porter, MD, Abram Sander   Janice Norrie, MD 06/10/14 [phone]

## 2014-06-14 ENCOUNTER — Emergency Department (HOSPITAL_COMMUNITY)
Admission: EM | Admit: 2014-06-14 | Discharge: 2014-06-14 | Disposition: A | Discharge status: Home / Self Care | Payer: Medicaid Other | Attending: Emergency Medicine | Admitting: Emergency Medicine

## 2014-06-14 ENCOUNTER — Emergency Department (HOSPITAL_COMMUNITY): Payer: Medicaid Other

## 2014-06-14 ENCOUNTER — Encounter (HOSPITAL_COMMUNITY): Payer: Self-pay | Admitting: Emergency Medicine

## 2014-06-14 DIAGNOSIS — W108XXA Fall (on) (from) other stairs and steps, initial encounter: Secondary | ICD-10-CM | POA: Diagnosis not present

## 2014-06-14 DIAGNOSIS — S99919A Unspecified injury of unspecified ankle, initial encounter: Secondary | ICD-10-CM

## 2014-06-14 DIAGNOSIS — Z7982 Long term (current) use of aspirin: Secondary | ICD-10-CM | POA: Diagnosis not present

## 2014-06-14 DIAGNOSIS — Z79899 Other long term (current) drug therapy: Secondary | ICD-10-CM | POA: Insufficient documentation

## 2014-06-14 DIAGNOSIS — S99929A Unspecified injury of unspecified foot, initial encounter: Secondary | ICD-10-CM

## 2014-06-14 DIAGNOSIS — Z880 Allergy status to penicillin: Secondary | ICD-10-CM | POA: Diagnosis not present

## 2014-06-14 DIAGNOSIS — S8990XA Unspecified injury of unspecified lower leg, initial encounter: Secondary | ICD-10-CM | POA: Diagnosis not present

## 2014-06-14 DIAGNOSIS — M129 Arthropathy, unspecified: Secondary | ICD-10-CM | POA: Insufficient documentation

## 2014-06-14 DIAGNOSIS — E669 Obesity, unspecified: Secondary | ICD-10-CM | POA: Insufficient documentation

## 2014-06-14 DIAGNOSIS — G8929 Other chronic pain: Secondary | ICD-10-CM | POA: Insufficient documentation

## 2014-06-14 DIAGNOSIS — I252 Old myocardial infarction: Secondary | ICD-10-CM | POA: Diagnosis not present

## 2014-06-14 DIAGNOSIS — Y9389 Activity, other specified: Secondary | ICD-10-CM | POA: Diagnosis not present

## 2014-06-14 DIAGNOSIS — I5032 Chronic diastolic (congestive) heart failure: Secondary | ICD-10-CM | POA: Insufficient documentation

## 2014-06-14 DIAGNOSIS — Z87891 Personal history of nicotine dependence: Secondary | ICD-10-CM | POA: Diagnosis not present

## 2014-06-14 DIAGNOSIS — E039 Hypothyroidism, unspecified: Secondary | ICD-10-CM | POA: Insufficient documentation

## 2014-06-14 DIAGNOSIS — Y929 Unspecified place or not applicable: Secondary | ICD-10-CM | POA: Diagnosis not present

## 2014-06-14 DIAGNOSIS — N3000 Acute cystitis without hematuria: Secondary | ICD-10-CM | POA: Diagnosis not present

## 2014-06-14 DIAGNOSIS — J441 Chronic obstructive pulmonary disease with (acute) exacerbation: Secondary | ICD-10-CM | POA: Diagnosis not present

## 2014-06-14 DIAGNOSIS — I10 Essential (primary) hypertension: Secondary | ICD-10-CM | POA: Diagnosis not present

## 2014-06-14 DIAGNOSIS — J45901 Unspecified asthma with (acute) exacerbation: Secondary | ICD-10-CM | POA: Insufficient documentation

## 2014-06-14 DIAGNOSIS — R300 Dysuria: Secondary | ICD-10-CM | POA: Diagnosis present

## 2014-06-14 LAB — URINALYSIS, ROUTINE W REFLEX MICROSCOPIC
Bilirubin Urine: NEGATIVE
Glucose, UA: NEGATIVE mg/dL
Hgb urine dipstick: NEGATIVE
Ketones, ur: NEGATIVE mg/dL
Nitrite: POSITIVE — AB
PH: 5 (ref 5.0–8.0)
Protein, ur: NEGATIVE mg/dL
Specific Gravity, Urine: 1.012 (ref 1.005–1.030)
Urobilinogen, UA: 0.2 mg/dL (ref 0.0–1.0)

## 2014-06-14 LAB — URINE MICROSCOPIC-ADD ON

## 2014-06-14 LAB — CBC
HCT: 35 % — ABNORMAL LOW (ref 36.0–46.0)
Hemoglobin: 11.6 g/dL — ABNORMAL LOW (ref 12.0–15.0)
MCH: 27.6 pg (ref 26.0–34.0)
MCHC: 33.1 g/dL (ref 30.0–36.0)
MCV: 83.3 fL (ref 78.0–100.0)
Platelets: 343 10*3/uL (ref 150–400)
RBC: 4.2 MIL/uL (ref 3.87–5.11)
RDW: 14.6 % (ref 11.5–15.5)
WBC: 7.8 10*3/uL (ref 4.0–10.5)

## 2014-06-14 MED ORDER — DEXTROSE 5 % IV SOLN
1.0000 g | Freq: Once | INTRAVENOUS | Status: AC
  Administered 2014-06-14: 1 g via INTRAVENOUS
  Filled 2014-06-14: qty 10

## 2014-06-14 MED ORDER — CEPHALEXIN 500 MG PO CAPS: ORAL_CAPSULE | ORAL | Status: DC

## 2014-06-14 MED ORDER — SODIUM CHLORIDE 0.9 % IV BOLUS (SEPSIS)
1000.0000 mL | Freq: Once | INTRAVENOUS | Status: AC
  Administered 2014-06-14: 1000 mL via INTRAVENOUS

## 2014-06-14 NOTE — ED Notes (Signed)
Patient transported to X-ray 

## 2014-06-14 NOTE — ED Notes (Signed)
Per pt, states she fell last night and landed on both knees, also complaining of anxiety attacks and complaining of burning with urination and pelvic pain and feels like she has got to vomit but cant

## 2014-06-14 NOTE — Discharge Instructions (Signed)
Return here as needed.  Follow-up with your primary doctor. °

## 2014-06-14 NOTE — ED Provider Notes (Signed)
CSN: [id number]     Arrival date & time 06/14/14  1443 History   First MD Initiated Contact with Patient 06/14/14 1522     Chief Complaint  Patient presents with  . Dysuria     (Consider location/radiation/quality/duration/timing/severity/associated sxs/prior Treatment) HPI Patient presents to the emergency department with dysuria and pain in both his following a fall that occurred last night.  The patient, states she fell down several steps, landing on both of her knees.  The patient, states, that she's had any chest pain, shortness of breath, nausea, vomiting, weakness, dizziness, headache, blurred vision, fever, vaginal bleeding, vaginal discharge, rash, or syncope.  The patient, states, that she did not take any medications prior to arrival.  The patient, states she thinks she had an anxiety attack earlier this morning.  Patient, states, that she does not normally take medications for anxiety other than Paxil for depression.  Patient, states nothing seems make her condition, better, but palpation and movement make the pain, worse.  Patient, states, that her urine is dark colored, and she has pain when she urinates Past Medical History  Diagnosis Date  . Chest pain     a. s/p reported MI in 1997;  b. 09/2012 Cath: nl cors, nl LV ([place]);  c. 05/2012 Non-ischemic myoview.  . Hypertension   . Arthritis   . Problem with literacy   . Shortness of breath   . Anxiety   . Bronchitis   . Chronic headaches   . Obesity   . COPD (chronic obstructive pulmonary disease)   . Chronic diastolic CHF (congestive heart failure)     a. 12/2011 Echo: nl ef, Gr 1 DD;  b. 11/2012 Echo: EF 45-50%, no significant valvular abnormalities.  Marland Kitchen GERD (gastroesophageal reflux disease)   . Myocardial infarction 1997    self reported  . Hypothyroidism   . Asthma   . Chronic chest pain    Past Surgical History  Procedure Laterality Date  . Ectopic pregnancy surgery      right  . Nm myoview ltd  June 2013   no reversible ischemia  . Cardiac catheterization  October 2012    No coronary artery disease  . Cardiac catheterization  2012    normal coronary arteries  . Nm myoview ltd  2012, 2013    normal  . 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 Knee arthroscopy  08/13/13    right   Family History  Problem Relation Age of Onset  . Cancer Mother 62    lung ca  . Diabetes Mother   . Anxiety disorder Sister   . Cancer Brother 60    lung  . Colon cancer Neg Hx   . Pancreatic cancer Neg Hx   . Rectal cancer Neg Hx   . Stomach cancer Neg Hx    History  Substance Use Topics  . Smoking status: Former Smoker -- 1.00 packs/day for 3 years    Quit date: 12/03/2010  . Smokeless tobacco: Never Used  . Alcohol Use: No   OB History   Grav Para Term Preterm Abortions TAB SAB Ect Mult Living                 Review of Systems   All other systems negative except as documented in the HPI. All pertinent positives and negatives as reviewed in the HPI. Allergies  Darvocet; Ibuprofen; Oxycodone; Tramadol; Tylenol; Celebrex; Naproxen; and Penicillins  Home Medications   Prior to Admission medications   Medication Sig Start Date End Date Taking? Authorizing Provider  aspirin EC 81 MG tablet Take 81 mg by mouth daily.    Historical Provider, MD  atorvastatin (LIPITOR) 40 MG tablet Take 40 mg by mouth daily.    Historical Provider, MD  beclomethasone (QVAR) 40 MCG/ACT inhaler Inhale 2 puffs into the lungs 2 (two) times daily.    Historical Provider, MD  levothyroxine (SYNTHROID, LEVOTHROID) 75 MCG tablet Take 75 mcg by mouth daily before breakfast.    Historical Provider, MD  metoprolol succinate (TOPROL-XL) 50 MG 24 hr tablet Take 50 mg by mouth daily. Take with or immediately following a meal.    Historical Provider, MD  oxyCODONE-acetaminophen (PERCOCET/ROXICET) 5-325 MG per tablet Take 1 tablet by mouth every 8 (eight) hours as needed for moderate pain or severe  pain. 06/09/14   Marissa Sciacca, PA-C  PARoxetine (PAXIL) 20 MG tablet Take 1 tablet (20 mg total) by mouth daily. 03/15/14   Andrena Mews, MD   BP 115/72  Pulse 87  Temp(Src) 99.9 F (37.7 C) (Oral)  Resp 16  SpO2 95% Physical Exam  Nursing note and vitals reviewed. Constitutional: She is oriented to person, place, and time. She appears well-developed and well-nourished. No distress.  HENT:  Head: Normocephalic and atraumatic.  Mouth/Throat: Oropharynx is clear and moist.  Eyes: Pupils are equal, round, and reactive to light.  Neck: Normal range of motion. Neck supple.  Cardiovascular: Normal rate, regular rhythm and normal heart sounds.  Exam reveals no gallop and no friction rub.   No murmur heard. Pulmonary/Chest: Effort normal and breath sounds normal. No respiratory distress.  Abdominal: Soft. Bowel sounds are normal. She exhibits no distension. There is no tenderness.  Musculoskeletal:       Right knee: She exhibits bony tenderness. She exhibits normal range of motion, no swelling, no effusion and no ecchymosis. Tenderness found. No patellar tendon tenderness noted.       Left knee: She exhibits bony tenderness. She exhibits normal range of motion, no swelling, no effusion, no ecchymosis, no LCL laxity, normal patellar mobility, normal meniscus and no MCL laxity. Tenderness found. No patellar tendon tenderness noted.  Neurological: She is alert and oriented to person, place, and time. She exhibits normal muscle tone. Coordination normal.  Skin: Skin is warm and dry. No rash noted. No erythema.    ED Course  Procedures (including critical care time) Labs Review Labs Reviewed  CBC - Abnormal; Notable for the following:    Hemoglobin 11.6 (*)    HCT 35.0 (*)    All other components within normal limits  URINE CULTURE  URINALYSIS, ROUTINE W REFLEX MICROSCOPIC  COMPREHENSIVE METABOLIC PANEL    Imaging Review Dg Pelvis 1-2 Views  06/14/2014   CLINICAL DATA:  fall  EXAM:  PELVIS - 1-2 VIEW  COMPARISON:  None.  FINDINGS: There is no evidence of pelvic fracture or diastasis. No other pelvic bone lesions are seen.  IMPRESSION: Negative.   Electronically Signed   By: Margaree Mackintosh M.D.   On: 06/14/2014 16:31   Dg Knee 2 Views Left  06/14/2014   CLINICAL DATA:  [place] last night and again this morning with knee pain  EXAM: LEFT KNEE - 1-2 VIEW  COMPARISON:  None.  FINDINGS: No fracture or dislocation. No joint effusion. Small focus of cystic change in the superior pole and another in the inferior pole of the patella posteriorly.  IMPRESSION: No acute  traumatic injury. Inclusion cysts along the posterior surface of the patella likely degenerative.   Electronically Signed   By: Skipper Cliche M.D.   On: 06/14/2014 16:28   Dg Knee 2 Views Right  06/14/2014   CLINICAL DATA:  Pain  EXAM: RIGHT KNEE - 1-2 VIEW  COMPARISON:  None.  FINDINGS: There is no evidence of fracture, dislocation, or joint effusion. There is no evidence of arthropathy or other focal bone abnormality. Soft tissues are unremarkable.  IMPRESSION: Negative.   Electronically Signed   By: Margaree Mackintosh M.D.   On: 06/14/2014 16:29    Patient received IV fluids, and we'll obtain x-rays of her knees.  Also obtain a urine sample, and blood testing.  Patient is advised the plan and all questions were answered.  Advised the patient that she needs any assistance to let us know and also advised her that I will be back in to talk with her when her test results are back.   Patient retreated for urinary tract infection to return here as needed.  Followup with her primary care Dr. told to increase her fluid intake, rest as much possible  Brent General, PA-C 06/15/14 0132

## 2014-06-20 ENCOUNTER — Emergency Department (HOSPITAL_COMMUNITY): Payer: Medicaid Other

## 2014-06-20 ENCOUNTER — Emergency Department (HOSPITAL_COMMUNITY)
Admission: EM | Admit: 2014-06-20 | Discharge: 2014-06-20 | Disposition: A | Discharge status: Home / Self Care | Payer: Medicaid Other | Attending: Emergency Medicine | Admitting: Emergency Medicine

## 2014-06-20 ENCOUNTER — Encounter (HOSPITAL_COMMUNITY): Payer: Self-pay | Admitting: Emergency Medicine

## 2014-06-20 DIAGNOSIS — I10 Essential (primary) hypertension: Secondary | ICD-10-CM | POA: Insufficient documentation

## 2014-06-20 DIAGNOSIS — I5032 Chronic diastolic (congestive) heart failure: Secondary | ICD-10-CM | POA: Diagnosis not present

## 2014-06-20 DIAGNOSIS — M129 Arthropathy, unspecified: Secondary | ICD-10-CM | POA: Diagnosis not present

## 2014-06-20 DIAGNOSIS — E669 Obesity, unspecified: Secondary | ICD-10-CM | POA: Diagnosis not present

## 2014-06-20 DIAGNOSIS — R079 Chest pain, unspecified: Secondary | ICD-10-CM | POA: Diagnosis not present

## 2014-06-20 DIAGNOSIS — E039 Hypothyroidism, unspecified: Secondary | ICD-10-CM | POA: Insufficient documentation

## 2014-06-20 DIAGNOSIS — J45901 Unspecified asthma with (acute) exacerbation: Secondary | ICD-10-CM | POA: Diagnosis not present

## 2014-06-20 DIAGNOSIS — R296 Repeated falls: Secondary | ICD-10-CM | POA: Insufficient documentation

## 2014-06-20 DIAGNOSIS — Z87891 Personal history of nicotine dependence: Secondary | ICD-10-CM | POA: Diagnosis not present

## 2014-06-20 DIAGNOSIS — F411 Generalized anxiety disorder: Secondary | ICD-10-CM | POA: Diagnosis not present

## 2014-06-20 DIAGNOSIS — IMO0002 Reserved for concepts with insufficient information to code with codable children: Secondary | ICD-10-CM | POA: Insufficient documentation

## 2014-06-20 DIAGNOSIS — Z79899 Other long term (current) drug therapy: Secondary | ICD-10-CM | POA: Diagnosis not present

## 2014-06-20 DIAGNOSIS — Z9861 Coronary angioplasty status: Secondary | ICD-10-CM | POA: Diagnosis not present

## 2014-06-20 DIAGNOSIS — Y9389 Activity, other specified: Secondary | ICD-10-CM | POA: Diagnosis not present

## 2014-06-20 DIAGNOSIS — Z792 Long term (current) use of antibiotics: Secondary | ICD-10-CM | POA: Insufficient documentation

## 2014-06-20 DIAGNOSIS — Z9889 Other specified postprocedural states: Secondary | ICD-10-CM | POA: Insufficient documentation

## 2014-06-20 DIAGNOSIS — S99919A Unspecified injury of unspecified ankle, initial encounter: Principal | ICD-10-CM

## 2014-06-20 DIAGNOSIS — J441 Chronic obstructive pulmonary disease with (acute) exacerbation: Secondary | ICD-10-CM | POA: Diagnosis not present

## 2014-06-20 DIAGNOSIS — Z8719 Personal history of other diseases of the digestive system: Secondary | ICD-10-CM | POA: Diagnosis not present

## 2014-06-20 DIAGNOSIS — Z880 Allergy status to penicillin: Secondary | ICD-10-CM | POA: Insufficient documentation

## 2014-06-20 DIAGNOSIS — S8990XA Unspecified injury of unspecified lower leg, initial encounter: Secondary | ICD-10-CM | POA: Insufficient documentation

## 2014-06-20 DIAGNOSIS — G8929 Other chronic pain: Secondary | ICD-10-CM | POA: Insufficient documentation

## 2014-06-20 DIAGNOSIS — Y9289 Other specified places as the place of occurrence of the external cause: Secondary | ICD-10-CM | POA: Insufficient documentation

## 2014-06-20 DIAGNOSIS — I252 Old myocardial infarction: Secondary | ICD-10-CM | POA: Diagnosis not present

## 2014-06-20 DIAGNOSIS — S99929A Unspecified injury of unspecified foot, initial encounter: Secondary | ICD-10-CM | POA: Diagnosis present

## 2014-06-20 DIAGNOSIS — M25561 Pain in right knee: Secondary | ICD-10-CM

## 2014-06-20 LAB — URINE CULTURE: Colony Count: >=100000

## 2014-06-20 MED ORDER — OXYCODONE-ACETAMINOPHEN 5-325 MG PO TABS
2.0000 | ORAL_TABLET | Freq: Once | ORAL | Status: AC
  Administered 2014-06-20: 2 via ORAL
  Filled 2014-06-20: qty 2

## 2014-06-20 NOTE — ED Provider Notes (Signed)
CSN: [id number]     Arrival date & time 06/20/14  1932 History  This chart was scribed for non-physician practitioner, Montine Circle, PA-C working with Dorie Rank, MD by Einar Pheasant, ED scribe. This patient was seen in room TR04C/TR04C and the patient's care was started at 8:45 PM.     Chief Complaint  Patient presents with  . Knee Pain   The history is provided by the patient. No language interpreter was used.   HPI Comments: [name] is a 58 y.o. female who presents to the Emergency Department with a chief complaint of worsening right knee pain that started approximately 2 month ago. Pt states that today she has had several falls due to her "right knee giving out on her". She reports taking Ibuprofen with no relief. Denies any fever, chills, SOB, chest,or diaphoresis.    Past Medical History  Diagnosis Date  . Chest pain     a. s/p reported MI in 1997;  b. 09/2012 Cath: nl cors, nl LV ([place]);  c. 05/2012 Non-ischemic myoview.  . Hypertension   . Arthritis   . Problem with literacy   . Shortness of breath   . Anxiety   . Bronchitis   . Chronic headaches   . Obesity   . COPD (chronic obstructive pulmonary disease)   . Chronic diastolic CHF (congestive heart failure)     a. 12/2011 Echo: nl ef, Gr 1 DD;  b. 11/2012 Echo: EF 45-50%, no significant valvular abnormalities.  Marland Kitchen GERD (gastroesophageal reflux disease)   . Myocardial infarction 1997    self reported  . Hypothyroidism   . Asthma   . Chronic chest pain    Past Surgical History  Procedure Laterality Date  . Ectopic pregnancy surgery      right  . Nm myoview ltd  June 2013    no reversible ischemia  . Cardiac catheterization  October 2012    No coronary artery disease  . Cardiac catheterization  2012    normal coronary arteries  . Nm myoview ltd  2012, 2013    normal  . 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 Knee arthroscopy  08/13/13    right   Family History   Problem Relation Age of Onset  . Cancer Mother 92    lung ca  . Diabetes Mother   . Anxiety disorder Sister   . Cancer Brother 60    lung  . Colon cancer Neg Hx   . Pancreatic cancer Neg Hx   . Rectal cancer Neg Hx   . Stomach cancer Neg Hx    History  Substance Use Topics  . Smoking status: Former Smoker -- 1.00 packs/day for 3 years    Quit date: 12/03/2010  . Smokeless tobacco: Never Used  . Alcohol Use: No   OB History   Grav Para Term Preterm Abortions TAB SAB Ect Mult Living                 Review of Systems  Constitutional: Negative for fever and chills.  HENT: Negative for congestion.   Respiratory: Negative for shortness of breath.   Cardiovascular: Negative for chest pain.  Gastrointestinal: Negative for nausea and vomiting.  Musculoskeletal: Positive for arthralgias.  Neurological: Positive for weakness. Negative for numbness.   Allergies  Darvocet; Ibuprofen; Oxycodone; Tramadol; Tylenol; Celebrex; Naproxen; and Penicillins  Home Medications   Prior to Admission medications   Medication  Sig Start Date End Date Taking? Authorizing Provider  aspirin EC 81 MG tablet Take 81 mg by mouth daily.    Historical Provider, MD  atorvastatin (LIPITOR) 40 MG tablet Take 40 mg by mouth daily.    Historical Provider, MD  beclomethasone (QVAR) 40 MCG/ACT inhaler Inhale 2 puffs into the lungs 2 (two) times daily.    Historical Provider, MD  cephALEXin (KEFLEX) 500 MG capsule 2 PO BID 06/14/14   Resa Miner Lawyer, PA-C  levothyroxine (SYNTHROID, LEVOTHROID) 75 MCG tablet Take 75 mcg by mouth daily before breakfast.    Historical Provider, MD  metoprolol succinate (TOPROL-XL) 50 MG 24 hr tablet Take 50 mg by mouth daily. Take with or immediately following a meal.    Historical Provider, MD  oxyCODONE-acetaminophen (PERCOCET/ROXICET) 5-325 MG per tablet Take 1 tablet by mouth every 8 (eight) hours as needed for moderate pain or severe pain. 06/09/14   Marissa Sciacca, PA-C   PARoxetine (PAXIL) 20 MG tablet Take 1 tablet (20 mg total) by mouth daily. 03/15/14   Andrena Mews, MD   BP 127/70  Pulse 75  Temp(Src) 98.1 F (36.7 C) (Oral)  Resp 18  Ht 5\' 8"  (1.727 m)  Wt 214 lb (97.07 kg)  BMI 32.55 kg/m2  SpO2 95%  Physical Exam  Nursing note and vitals reviewed. Constitutional: She is oriented to person, place, and time. She appears well-developed and well-nourished. No distress.  HENT:  Head: Normocephalic and atraumatic.  Eyes: Conjunctivae and EOM are normal.  Neck: Neck supple. No tracheal deviation present.  Cardiovascular: Normal rate.   Pulmonary/Chest: Effort normal. No respiratory distress.  Musculoskeletal: Normal range of motion.  Right knee moderately tender to palpation over the patella and the medial aspect. No obs swelling no abn or deformities noted.  Neurological: She is alert and oriented to person, place, and time.  Skin: Skin is warm and dry.  Psychiatric: She has a normal mood and affect. Her behavior is normal.    ED Course  Procedures (including critical care time)  DIAGNOSTIC STUDIES: Oxygen Saturation is 95% on RA, low by my interpretation.    COORDINATION OF CARE: 8:49 PM- Pt advised of plan for treatment and pt agrees.  Imaging Review Dg Knee Complete 4 Views Right  06/20/2014   CLINICAL DATA:  Right knee pain, worsening over 2 years.  EXAM: RIGHT KNEE - COMPLETE 4+ VIEW  COMPARISON:  06/14/2014  FINDINGS: Mild degenerative changes with hypertrophic changes in the medial compartment. There is no evidence of fracture, dislocation, or joint effusion. There is no evidence of arthropathy or other focal bone abnormality. Soft tissues are unremarkable.  IMPRESSION: Mild degenerative changes in the right knee. No acute bony abnormalities.   Electronically Signed   By: Lucienne Capers M.D.   On: 06/20/2014 21:20   MDM   Final diagnoses:  Right knee pain    Patient with chronic pain, chronic pain. No acute abnormality on  plain film. Patient has orthopedic followup. She has crutches and a knee immobilizer. Have encouraged her used. Will treat pain in the emergency department. Patient is stable and her for discharge.  I personally performed the services described in this documentation, which was scribed in my presence. The recorded information has been reviewed and is accurate.    Montine Circle, PA-C 06/20/14 [phone]

## 2014-06-20 NOTE — ED Notes (Signed)
Pt states that she has fallen four times this morning, and she believes it is because of her right knee. Pt states that she did not fell dizzy or lightheaded before she fell, and denies hitting her head.

## 2014-06-20 NOTE — ED Provider Notes (Signed)
Medical screening examination/treatment/procedure(s) were performed by non-physician practitioner and as supervising physician I was immediately available for consultation/collaboration.    Dorie Rank, MD 06/20/14 [phone]

## 2014-06-20 NOTE — Discharge Instructions (Signed)
Arthralgia °Your caregiver has diagnosed you as suffering from an arthralgia. Arthralgia means there is pain in a joint. This can come from many reasons including: °· Bruising the joint which causes soreness (inflammation) in the joint. °· Wear and tear on the joints which occur as we grow older (osteoarthritis). °· Overusing the joint. °· Various forms of arthritis. °· Infections of the joint. °Regardless of the cause of pain in your joint, most of these different pains respond to anti-inflammatory drugs and rest. The exception to this is when a joint is infected, and these cases are treated with antibiotics, if it is a bacterial infection. °HOME CARE INSTRUCTIONS  °· Rest the injured area for as long as directed by your caregiver. Then slowly start using the joint as directed by your caregiver and as the pain allows. Crutches as directed may be useful if the ankles, knees or hips are involved. If the knee was splinted or casted, continue use and care as directed. If an stretchy or elastic wrapping bandage has been applied today, it should be removed and re-applied every 3 to 4 hours. It should not be applied tightly, but firmly enough to keep swelling down. Watch toes and feet for swelling, bluish discoloration, coldness, numbness or excessive pain. If any of these problems (symptoms) occur, remove the ace bandage and re-apply more loosely. If these symptoms persist, contact your caregiver or return to this location. °· For the first 24 hours, keep the injured extremity elevated on pillows while lying down. °· Apply ice for 15-20 minutes to the sore joint every couple hours while awake for the first half day. Then 03-04 times per day for the first 48 hours. Put the ice in a plastic bag and place a towel between the bag of ice and your skin. °· Wear any splinting, casting, elastic bandage applications, or slings as instructed. °· Only take over-the-counter or prescription medicines for pain, discomfort, or fever as  directed by your caregiver. Do not use aspirin immediately after the injury unless instructed by your physician. Aspirin can cause increased bleeding and bruising of the tissues. °· If you were given crutches, continue to use them as instructed and do not resume weight bearing on the sore joint until instructed. °Persistent pain and inability to use the sore joint as directed for more than 2 to 3 days are warning signs indicating that you should see a caregiver for a follow-up visit as soon as possible. Initially, a hairline fracture (break in bone) may not be evident on X-rays. Persistent pain and swelling indicate that further evaluation, non-weight bearing or use of the joint (use of crutches or slings as instructed), or further X-rays are indicated. X-rays may sometimes not show a small fracture until a week or 10 days later. Make a follow-up appointment with your own caregiver or one to whom we have referred you. A radiologist (specialist in reading X-rays) may read your X-rays. Make sure you know how you are to obtain your X-ray results. Do not assume everything is normal if you do not hear from us. °SEEK MEDICAL CARE IF: °Bruising, swelling, or pain increases. °SEEK IMMEDIATE MEDICAL CARE IF:  °· Your fingers or toes are numb or blue. °· The pain is not responding to medications and continues to stay the same or get worse. °· The pain in your joint becomes severe. °· You develop a fever over 102° F (38.9° C). °· It becomes impossible to move or use the joint. °MAKE SURE YOU:  °·   Understand these instructions.  Will watch your condition.  Will get help right away if you are not doing well or get worse. Document Released: 12/02/2005 Document Revised: 02/24/2012 Document Reviewed: 07/20/2008 [facility] Patient Information 2015 [place], [place]. This information is not intended to replace advice given to you by your health care provider. Make sure you discuss any questions you have with your health care  provider.  Cryotherapy Cryotherapy means treatment with cold. Ice or gel packs can be used to reduce both pain and swelling. Ice is the most helpful within the first 24 to 48 hours after an injury or flareup from overusing a muscle or joint. Sprains, strains, spasms, burning pain, shooting pain, and aches can all be eased with ice. Ice can also be used when recovering from surgery. Ice is effective, has very few side effects, and is safe for most people to use. PRECAUTIONS  Ice is not a safe treatment option for people with:  Raynaud's phenomenon. This is a condition affecting small blood vessels in the extremities. Exposure to cold may cause your problems to return.  Cold hypersensitivity. There are many forms of cold hypersensitivity, including:  Cold urticaria. Red, itchy hives appear on the skin when the tissues begin to warm after being iced.  Cold erythema. This is a red, itchy rash caused by exposure to cold.  Cold hemoglobinuria. Red blood cells break down when the tissues begin to warm after being iced. The hemoglobin that carry oxygen are passed into the urine because they cannot combine with blood proteins fast enough.  Numbness or altered sensitivity in the area being iced. If you have any of the following conditions, do not use ice until you have discussed cryotherapy with your caregiver:  Heart conditions, such as arrhythmia, angina, or chronic heart disease.  High blood pressure.  Healing wounds or open skin in the area being iced.  Current infections.  Rheumatoid arthritis.  Poor circulation.  Diabetes. Ice slows the blood flow in the region it is applied. This is beneficial when trying to stop inflamed tissues from spreading irritating chemicals to surrounding tissues. However, if you expose your skin to cold temperatures for too long or without the proper protection, you can damage your skin or nerves. Watch for signs of skin damage due to cold. HOME CARE  INSTRUCTIONS Follow these tips to use ice and cold packs safely.  Place a dry or damp towel between the ice and skin. A damp towel will cool the skin more quickly, so you may need to shorten the time that the ice is used.  For a more rapid response, add gentle compression to the ice.  Ice for no more than 10 to 20 minutes at a time. The bonier the area you are icing, the less time it will take to get the benefits of ice.  Check your skin after 5 minutes to make sure there are no signs of a poor response to cold or skin damage.  Rest 20 minutes or more in between uses.  Once your skin is numb, you can end your treatment. You can test numbness by very lightly touching your skin. The touch should be so light that you do not see the skin dimple from the pressure of your fingertip. When using ice, most people will feel these normal sensations in this order: cold, burning, aching, and numbness.  Do not use ice on someone who cannot communicate their responses to pain, such as small children or people with dementia. HOW TO  MAKE AN ICE PACK Ice packs are the most common way to use ice therapy. Other methods include ice massage, ice baths, and cryo-sprays. Muscle creams that cause a cold, tingly feeling do not offer the same benefits that ice offers and should not be used as a substitute unless recommended by your caregiver. To make an ice pack, do one of the following:  Place crushed ice or a bag of frozen vegetables in a sealable plastic bag. Squeeze out the excess air. Place this bag inside another plastic bag. Slide the bag into a pillowcase or place a damp towel between your skin and the bag.  Mix 3 parts water with 1 part rubbing alcohol. Freeze the mixture in a sealable plastic bag. When you remove the mixture from the freezer, it will be slushy. Squeeze out the excess air. Place this bag inside another plastic bag. Slide the bag into a pillowcase or place a damp towel between your skin and the  bag. SEEK MEDICAL CARE IF:  You develop white spots on your skin. This may give the skin a blotchy (mottled) appearance.  Your skin turns blue or pale.  Your skin becomes waxy or hard.  Your swelling gets worse. MAKE SURE YOU:   Understand these instructions.  Will watch your condition.  Will get help right away if you are not doing well or get worse. Document Released: 07/29/2011 Document Revised: 02/24/2012 Document Reviewed: 07/29/2011 [facility] Patient Information 2015 [place], [place]. This information is not intended to replace advice given to you by your health care provider. Make sure you discuss any questions you have with your health care provider.

## 2014-06-20 NOTE — ED Notes (Signed)
Present with right knee pain that began a couple months ago, reports knee giving out and falling a couple times due to knee pain. Right knee swelling, no warmth or redness.

## 2014-06-21 ENCOUNTER — Ambulatory Visit: Payer: Medicaid Other | Admitting: Family Medicine

## 2014-06-21 NOTE — ED Provider Notes (Signed)
Medical screening examination/treatment/procedure(s) were performed by non-physician practitioner and as supervising physician I was immediately available for consultation/collaboration.    Dorie Rank, MD 06/21/14 [phone]

## 2014-06-24 ENCOUNTER — Ambulatory Visit: Payer: Medicaid Other | Admitting: Family Medicine

## 2014-06-28 ENCOUNTER — Emergency Department (HOSPITAL_COMMUNITY)
Admission: EM | Admit: 2014-06-28 | Discharge: 2014-06-28 | Disposition: A | Discharge status: Home / Self Care | Payer: Medicaid Other | Attending: Emergency Medicine | Admitting: Emergency Medicine

## 2014-06-28 ENCOUNTER — Emergency Department (HOSPITAL_COMMUNITY): Payer: Medicaid Other

## 2014-06-28 ENCOUNTER — Encounter (HOSPITAL_COMMUNITY): Payer: Self-pay | Admitting: Emergency Medicine

## 2014-06-28 DIAGNOSIS — Y9289 Other specified places as the place of occurrence of the external cause: Secondary | ICD-10-CM | POA: Diagnosis not present

## 2014-06-28 DIAGNOSIS — J449 Chronic obstructive pulmonary disease, unspecified: Secondary | ICD-10-CM | POA: Insufficient documentation

## 2014-06-28 DIAGNOSIS — Z880 Allergy status to penicillin: Secondary | ICD-10-CM | POA: Diagnosis not present

## 2014-06-28 DIAGNOSIS — I5032 Chronic diastolic (congestive) heart failure: Secondary | ICD-10-CM | POA: Diagnosis not present

## 2014-06-28 DIAGNOSIS — E669 Obesity, unspecified: Secondary | ICD-10-CM | POA: Insufficient documentation

## 2014-06-28 DIAGNOSIS — E039 Hypothyroidism, unspecified: Secondary | ICD-10-CM | POA: Insufficient documentation

## 2014-06-28 DIAGNOSIS — I10 Essential (primary) hypertension: Secondary | ICD-10-CM | POA: Insufficient documentation

## 2014-06-28 DIAGNOSIS — Y9389 Activity, other specified: Secondary | ICD-10-CM | POA: Insufficient documentation

## 2014-06-28 DIAGNOSIS — W108XXA Fall (on) (from) other stairs and steps, initial encounter: Secondary | ICD-10-CM | POA: Insufficient documentation

## 2014-06-28 DIAGNOSIS — I252 Old myocardial infarction: Secondary | ICD-10-CM | POA: Diagnosis not present

## 2014-06-28 DIAGNOSIS — G8929 Other chronic pain: Secondary | ICD-10-CM | POA: Insufficient documentation

## 2014-06-28 DIAGNOSIS — J4489 Other specified chronic obstructive pulmonary disease: Secondary | ICD-10-CM | POA: Insufficient documentation

## 2014-06-28 DIAGNOSIS — Z87891 Personal history of nicotine dependence: Secondary | ICD-10-CM | POA: Diagnosis not present

## 2014-06-28 DIAGNOSIS — S298XXA Other specified injuries of thorax, initial encounter: Secondary | ICD-10-CM | POA: Insufficient documentation

## 2014-06-28 DIAGNOSIS — Z7982 Long term (current) use of aspirin: Secondary | ICD-10-CM | POA: Insufficient documentation

## 2014-06-28 DIAGNOSIS — R079 Chest pain, unspecified: Secondary | ICD-10-CM

## 2014-06-28 DIAGNOSIS — W19XXXA Unspecified fall, initial encounter: Secondary | ICD-10-CM

## 2014-06-28 DIAGNOSIS — M129 Arthropathy, unspecified: Secondary | ICD-10-CM | POA: Diagnosis not present

## 2014-06-28 DIAGNOSIS — Z79899 Other long term (current) drug therapy: Secondary | ICD-10-CM | POA: Diagnosis not present

## 2014-06-28 LAB — I-STAT TROPONIN, ED: TROPONIN I, POC: 0 ng/mL (ref 0.00–0.08)

## 2014-06-28 LAB — BASIC METABOLIC PANEL
ANION GAP: 17 — AB (ref 5–15)
BUN: 13 mg/dL (ref 6–23)
CALCIUM: 9.1 mg/dL (ref 8.4–10.5)
CO2: 21 meq/L (ref 19–32)
CREATININE: 0.8 mg/dL (ref 0.50–1.10)
Chloride: 102 mEq/L (ref 96–112)
GFR calc Af Amer: >90 mL/min (ref >90)
GFR calc non Af Amer: 80 mL/min — ABNORMAL LOW (ref >90)
Glucose, Bld: 93 mg/dL (ref 70–99)
Potassium: 4.1 mEq/L (ref 3.7–5.3)
Sodium: 140 mEq/L (ref 137–147)

## 2014-06-28 LAB — CBC
HCT: 38.7 % (ref 36.0–46.0)
HEMOGLOBIN: 12.6 g/dL (ref 12.0–15.0)
MCH: 27.4 pg (ref 26.0–34.0)
MCHC: 32.6 g/dL (ref 30.0–36.0)
MCV: 84.1 fL (ref 78.0–100.0)
PLATELETS: 307 10*3/uL (ref 150–400)
RBC: 4.6 MIL/uL (ref 3.87–5.11)
RDW: 14.5 % (ref 11.5–15.5)
WBC: 6.9 10*3/uL (ref 4.0–10.5)

## 2014-06-28 NOTE — ED Provider Notes (Signed)
CSN: [id number]     Arrival date & time 06/28/14  1832 History   First MD Initiated Contact with Patient 06/28/14 2139     Chief Complaint  Patient presents with  . Chest Pain  . Fall      HPI Patient reports slipping last time the rain and hitting her low back on a step.  She reports mild low back pain without weakness of her arms or legs.  No head injury.  No neck pain.  She reports pain with movement and palpation of her anterior chest.  She states this worsened today.  She states normally she is on Percocet as prescribed by her orthopedic surgeon but she is currently out of this.  She's had 27 visits to the emergency department in the past 6 months for pain-related issues.  She denies fevers or chills.  She does report cough.  No history of cardiac disease.  Preferably one hand width of the tibia   Past Medical History  Diagnosis Date  . Chest pain     a. s/p reported MI in 1997;  b. 09/2012 Cath: nl cors, nl LV ([place]);  c. 05/2012 Non-ischemic myoview.  . Hypertension   . Arthritis   . Problem with literacy   . Shortness of breath   . Anxiety   . Bronchitis   . Chronic headaches   . Obesity   . COPD (chronic obstructive pulmonary disease)   . Chronic diastolic CHF (congestive heart failure)     a. 12/2011 Echo: nl ef, Gr 1 DD;  b. 11/2012 Echo: EF 45-50%, no significant valvular abnormalities.  Marland Kitchen GERD (gastroesophageal reflux disease)   . Myocardial infarction 1997    self reported  . Hypothyroidism   . Asthma   . Chronic chest pain    Past Surgical History  Procedure Laterality Date  . Ectopic pregnancy surgery      right  . Nm myoview ltd  June 2013    no reversible ischemia  . Cardiac catheterization  October 2012    No coronary artery disease  . Cardiac catheterization  2012    normal coronary arteries  . Nm myoview ltd  2012, 2013    normal  . 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 Knee arthroscopy  08/13/13    right    Family History  Problem Relation Age of Onset  . Cancer Mother 108    lung ca  . Diabetes Mother   . Anxiety disorder Sister   . Cancer Brother 60    lung  . Colon cancer Neg Hx   . Pancreatic cancer Neg Hx   . Rectal cancer Neg Hx   . Stomach cancer Neg Hx    History  Substance Use Topics  . Smoking status: Former Smoker -- 1.00 packs/day for 3 years    Quit date: 12/03/2010  . Smokeless tobacco: Never Used  . Alcohol Use: No   OB History   Grav Para Term Preterm Abortions TAB SAB Ect Mult Living                 Review of Systems  All other systems reviewed and are negative.     Allergies  Darvocet; Ibuprofen; Oxycodone; Tramadol; Tylenol; Celebrex; Naproxen; and Penicillins  Home Medications   Prior to Admission medications   Medication Sig Start Date End Date Taking? Authorizing Provider  aspirin EC 81 MG tablet Take 81 mg  by mouth daily.   Yes Historical Provider, MD  atorvastatin (LIPITOR) 40 MG tablet Take 40 mg by mouth daily.   Yes Historical Provider, MD  beclomethasone (QVAR) 40 MCG/ACT inhaler Inhale 2 puffs into the lungs 2 (two) times daily.   Yes Historical Provider, MD  cephALEXin (KEFLEX) 500 MG capsule Take 1,000 mg by mouth 2 (two) times daily.   Yes Historical Provider, MD  levothyroxine (SYNTHROID, LEVOTHROID) 75 MCG tablet Take 75 mcg by mouth daily before breakfast.   Yes Historical Provider, MD  metoprolol succinate (TOPROL-XL) 50 MG 24 hr tablet Take 50 mg by mouth daily. Take with or immediately following a meal.   Yes Historical Provider, MD  oxyCODONE-acetaminophen (PERCOCET/ROXICET) 5-325 MG per tablet Take 1 tablet by mouth every 8 (eight) hours as needed for moderate pain or severe pain. 06/09/14  Yes Marissa Sciacca, PA-C  PARoxetine (PAXIL) 20 MG tablet Take 1 tablet (20 mg total) by mouth daily. 03/15/14  Yes Andrena Mews, MD   BP 98/69  Pulse 62  Temp(Src) 98.1 F (36.7 C) (Oral)  Resp 16  SpO2 100% Physical Exam  Nursing note  and vitals reviewed. Constitutional: She is oriented to person, place, and time. She appears well-developed and well-nourished. No distress.  HENT:  Head: Normocephalic and atraumatic.  Eyes: EOM are normal.  Neck: Normal range of motion.  Cardiovascular: Normal rate, regular rhythm and normal heart sounds.   Pulmonary/Chest: Effort normal and breath sounds normal.  Reproducible anterior chest wall tenderness.  No bruising noted  Abdominal: Soft. She exhibits no distension. There is no tenderness.  Musculoskeletal: Normal range of motion.  Neurological: She is alert and oriented to person, place, and time.  Skin: Skin is warm and dry.  Psychiatric: She has a normal mood and affect. Judgment normal.    ED Course  Procedures (including critical care time) Labs Review Labs Reviewed  BASIC METABOLIC PANEL - Abnormal; Notable for the following:    GFR calc non Af Amer 80 (*)    Anion gap 17 (*)    All other components within normal limits  CBC  I-STAT TROPOININ, ED    Imaging Review Dg Chest 2 View  06/28/2014   CLINICAL DATA:  Cough  EXAM: CHEST  2 VIEW  COMPARISON:  06/07/2014  FINDINGS: The heart size and mediastinal contours are within normal limits. Both lungs are clear. The visualized skeletal structures are unremarkable.  IMPRESSION: No active cardiopulmonary disease.   Electronically Signed   By: Inez Catalina M.D.   On: 06/28/2014 19:34  I personally reviewed the imaging tests through PACS system I reviewed available ER/hospitalization records through the EMR     EKG Interpretation None      MDM   Final diagnoses:  Chest pain, unspecified chest pain type  Fall, initial encounter    Doubt ACS.  Doubt PE.  Likely all musculoskeletal from her fall last night.    Hoy Morn, MD 06/28/14 2216

## 2014-06-28 NOTE — ED Notes (Signed)
PResents post fall last night while taking dog outside, slipped on step and fell down 2 steps,  c/o lower back pain. AT apporximately 5:45 began having upper chest pain described as aching associated with nausea, pain is reproducable with touch and cough. Reports dry hacking cough for 2 months.

## 2014-07-01 ENCOUNTER — Encounter: Payer: Self-pay | Admitting: Family Medicine

## 2014-07-01 ENCOUNTER — Ambulatory Visit (INDEPENDENT_AMBULATORY_CARE_PROVIDER_SITE_OTHER): Payer: Medicaid Other | Admitting: Family Medicine

## 2014-07-01 VITALS — BP 113/80 | HR 70 | Temp 98.0°F | Ht 68.0 in | Wt 214.0 lb

## 2014-07-01 VITALS — BP 114/83 | HR 69 | Ht 68.0 in | Wt 214.0 lb

## 2014-07-01 DIAGNOSIS — F419 Anxiety disorder, unspecified: Secondary | ICD-10-CM

## 2014-07-01 DIAGNOSIS — M25561 Pain in right knee: Secondary | ICD-10-CM

## 2014-07-01 DIAGNOSIS — M549 Dorsalgia, unspecified: Secondary | ICD-10-CM

## 2014-07-01 DIAGNOSIS — I5022 Chronic systolic (congestive) heart failure: Secondary | ICD-10-CM

## 2014-07-01 DIAGNOSIS — M545 Low back pain, unspecified: Secondary | ICD-10-CM

## 2014-07-01 DIAGNOSIS — E039 Hypothyroidism, unspecified: Secondary | ICD-10-CM

## 2014-07-01 DIAGNOSIS — M1711 Unilateral primary osteoarthritis, right knee: Secondary | ICD-10-CM

## 2014-07-01 DIAGNOSIS — IMO0002 Reserved for concepts with insufficient information to code with codable children: Secondary | ICD-10-CM

## 2014-07-01 DIAGNOSIS — F411 Generalized anxiety disorder: Secondary | ICD-10-CM

## 2014-07-01 DIAGNOSIS — M25569 Pain in unspecified knee: Secondary | ICD-10-CM

## 2014-07-01 DIAGNOSIS — M1710 Unilateral primary osteoarthritis, unspecified knee: Secondary | ICD-10-CM

## 2014-07-01 DIAGNOSIS — G8929 Other chronic pain: Secondary | ICD-10-CM

## 2014-07-01 MED ORDER — PAROXETINE HCL 20 MG PO TABS: 20.0000 mg | ORAL_TABLET | Freq: Every day | ORAL | Status: DC

## 2014-07-01 MED ORDER — METOPROLOL SUCCINATE ER 50 MG PO TB24: 50.0000 mg | ORAL_TABLET | Freq: Every day | ORAL | Status: DC

## 2014-07-01 MED ORDER — ATORVASTATIN CALCIUM 40 MG PO TABS: 40.0000 mg | ORAL_TABLET | Freq: Every day | ORAL | Status: DC

## 2014-07-01 MED ORDER — LEVOTHYROXINE SODIUM 75 MCG PO TABS: 75.0000 ug | ORAL_TABLET | Freq: Every day | ORAL | Status: DC

## 2014-07-01 NOTE — Assessment & Plan Note (Signed)
ECHO done in 2014 reviewed with normal ejection fraction. Seem euvolemic although she does have mild bibasal crackles. Xray of the check done few days ago was normal. I refilled her Metoprolol. Consider diuretics.

## 2014-07-01 NOTE — Assessment & Plan Note (Signed)
Chronic back pain. Arthritis vs disc herniation. Xray of the spine had been ordered multiple time but patient never go to obtain them. I ordered xray again today. Since patient is going to sport medicine clinic today, I will prefer to wait to start her on pain medicine. As discussed with her she may get intraarticular steroid injection. F/U in 3-4 wks.

## 2014-07-01 NOTE — Progress Notes (Signed)
Subjective:     Patient ID: [name], female   DOB: 12/21/1955, 58 y.o.   MRN: [id number]  HPI Back pain:Patient continues to have lower back pain, she stated she missed her orthopedic appointment and pain clinic appointment due to the death of her husband, she will like me to refer her again. She is allergic to most other pain medication, she stated she is fine with percocet. Hypothyroidism: Patient stated she has been off her medication for weeks, she took it one time about 5 days ago. CHF:Off all medications for weeks. Denies any chest pain, no SOB,no leg swelling but has knee swelling from pain. Anxiety:She has not been taking her Paxil, she stated her husband died 2 months ago and her sister was admitted in the hospital as well hence she had not been able to get her medications from the pharmacy. She denies any nervousness. Feels down due to many stressful event going on at home, she denies being suicidal.  Current Outpatient Prescriptions on File Prior to Visit  Medication Sig Dispense Refill  . aspirin EC 81 MG tablet Take 81 mg by mouth daily.      Marland Kitchen atorvastatin (LIPITOR) 40 MG tablet Take 40 mg by mouth daily.      . beclomethasone (QVAR) 40 MCG/ACT inhaler Inhale 2 puffs into the lungs 2 (two) times daily.      . cephALEXin (KEFLEX) 500 MG capsule Take 1,000 mg by mouth 2 (two) times daily.      Marland Kitchen HYDROcodone-acetaminophen (NORCO/VICODIN) 5-325 MG per tablet Take 1 tablet by mouth.      . levothyroxine (SYNTHROID, LEVOTHROID) 75 MCG tablet Take 75 mcg by mouth daily before breakfast.      . metoprolol succinate (TOPROL-XL) 50 MG 24 hr tablet Take 50 mg by mouth daily. Take with or immediately following a meal.      . oxyCODONE-acetaminophen (PERCOCET/ROXICET) 5-325 MG per tablet Take 1 tablet by mouth every 8 (eight) hours as needed for moderate pain or severe pain.  9 tablet  0  . PARoxetine (PAXIL) 20 MG tablet Take 1 tablet (20 mg total) by mouth daily.  90 tablet  1  .  torsemide (DEMADEX) 20 MG tablet Take 20 mg by mouth.       No current facility-administered medications on file prior to visit.   Past Medical History  Diagnosis Date  . Chest pain     a. s/p reported MI in 1997;  b. 09/2012 Cath: nl cors, nl LV ([place]);  c. 05/2012 Non-ischemic myoview.  . Hypertension   . Arthritis   . Problem with literacy   . Shortness of breath   . Anxiety   . Bronchitis   . Chronic headaches   . Obesity   . COPD (chronic obstructive pulmonary disease)   . Chronic diastolic CHF (congestive heart failure)     a. 12/2011 Echo: nl ef, Gr 1 DD;  b. 11/2012 Echo: EF 45-50%, no significant valvular abnormalities.  Marland Kitchen GERD (gastroesophageal reflux disease)   . Myocardial infarction 1997    self reported  . Hypothyroidism   . Asthma   . Chronic chest pain        Review of Systems  Respiratory: Negative.   Cardiovascular: Negative.   Gastrointestinal: Negative.   Genitourinary: Negative.   Musculoskeletal: Positive for back pain.  Psychiatric/Behavioral: Negative for suicidal ideas and self-injury. The patient is not nervous/anxious.   All other systems reviewed and are negative.  Filed Vitals:  07/01/14 1014  BP: 113/80  Pulse: 70  Temp: 98 F (36.7 C)  TempSrc: Oral  Height: 5\' 8"  (1.727 m)  Weight: 214 lb (97.07 kg)       Objective:   Physical Exam  Nursing note and vitals reviewed. Constitutional: She appears well-developed. No distress.  Cardiovascular: Normal rate, regular rhythm, normal heart sounds and intact distal pulses.   No murmur heard. Pulmonary/Chest: Effort normal and breath sounds normal. No respiratory distress. She has no decreased breath sounds. She has no wheezes.  Mild bibasal crackles.  Abdominal: Soft. Bowel sounds are normal. She exhibits no distension. There is no tenderness.  Musculoskeletal:       Lumbar back: She exhibits tenderness. She exhibits no swelling and no deformity.  Neurological: She has normal  strength. No sensory deficit.  Psychiatric: She has a normal mood and affect. Thought content normal. She is not actively hallucinating. She expresses no homicidal and no suicidal ideation.       Assessment:     Back pain: Hypothyroidism: CHF: Anxiety:    Plan:     Check problem list.

## 2014-07-01 NOTE — Assessment & Plan Note (Signed)
This is likely contributing a lot to most of her health problem. Might have worsened with the death of her husband. She also has some element of mild depression. She has been non-compliant with her Paxil. I encouraged her to restart her Paxil today. She agreed with plan. She is not suicidal or homicidal. I will reassess her in 3-4 wks.

## 2014-07-01 NOTE — Patient Instructions (Signed)
It was nice seeing you today Mrs [name], I am sorry you are still having back pain.  Since you have an appointment at the sport medicine clinic today, I will defer treatment of your back to them. Also remember to go for the xray of your back after sport medicine clinic. I will like to see you back in 2 wks for reassessment. Please remember to start taking all your medication especially the synthroid. This will make you feel better. If you have any question, please give me a call.

## 2014-07-01 NOTE — Assessment & Plan Note (Signed)
Patient has been non-compliant with her medication. I again counseled her on need for compliance. TSH not rechecked today since she had not been taking her medication. I refilled her medication again today.

## 2014-07-04 NOTE — Progress Notes (Signed)
Patient ID: [name], female   DOB: 06-13-1956, 58 y.o.   MRN: [id number]  [name] - 58 y.o. female MRN [id number]  Date of birth: 01/04/1956    SUBJECTIVE:     Right knee pain. ROS:     No unusual weight change, no incontinence of bowel or bladder, no fever, sweats, chills.  PERTINENT  PMH / PSH FH / / SH:  Past Medical, Surgical, Social, and Family History Reviewed & Updated in the EMR.  Pertinent findings include:  Mild obesity, history of coronary disease, hyperlipidemia, hypothyroidism, chronic low back pain. No history of knee surgery.  OBJECTIVE: BP 114/83  Pulse 69  Ht 5\' 8"  (1.727 m)  Wt 214 lb (97.07 kg)  BMI 32.55 kg/m2  Physical Exam:  Vital signs are reviewed. GENERAL: Well-developed overweight female no acute distress in KNEES: Bilaterally symmetrical. No effusions. No erythema or warmth.  KNEE: Right. Full range of motion in flexion extension. Significant amount of crepitus noted on extension. Medial joint line tenderness on the right. The lateral joint line is nontender. The calf is soft. Popliteal space is soft. She is ligaments intact to varus and valgus stress. Normal [place].  INJECTION: Patient was given informed consent, signed copy in the chart. Appropriate time out was taken. Area prepped and draped in usual sterile fashion. One cc of methylprednisolone 40 mg/ml plus  4 cc of 1% lidocaine without epinephrine was injected into the right knee using a(n) anterior medial approach. The patient tolerated the procedure well. There were no complications. Post procedure instructions were given.  IMAGING: Complete for a few x-ray of the right knee is reviewed. It is not a weight bearing found. She has some sclerosis and even though this is not a weight bearing found she has some medial compartment joint space narrowing. Not a lot of osteophytes. ASSESSMENT & PLAN:  See problem based charting & AVS for pt instructions.'

## 2014-07-05 NOTE — Assessment & Plan Note (Signed)
Reviewed her knee films which were non-weightbearing. We discussed options. She does try corticosteroid injection which we discussed in detail and then performed today in clinic. She's aware that she cannot have these any more often in 3 months and ideally should space and as far apart as possible. Followup when necessary

## 2014-07-11 ENCOUNTER — Ambulatory Visit: Payer: Medicaid Other | Admitting: Internal Medicine

## 2014-07-13 ENCOUNTER — Telehealth: Payer: Self-pay | Admitting: Family Medicine

## 2014-07-13 NOTE — Telephone Encounter (Signed)
[name],  Can we change patient's orthopedic referral to Dr. Konrad Felix in [place] on [address] Ph # [phone] ?

## 2014-07-13 NOTE — Telephone Encounter (Signed)
[name] need a referral to her orthopedic doctor, Dr. Konrad Felix in [place] on [address]   Ph # [phone]

## 2014-07-14 ENCOUNTER — Other Ambulatory Visit: Payer: Self-pay | Admitting: Family Medicine

## 2014-07-14 DIAGNOSIS — M545 Low back pain, unspecified: Secondary | ICD-10-CM

## 2014-07-14 DIAGNOSIS — M25562 Pain in left knee: Principal | ICD-10-CM

## 2014-07-14 DIAGNOSIS — M25561 Pain in right knee: Secondary | ICD-10-CM

## 2014-07-15 NOTE — Telephone Encounter (Signed)
Did go to [facility] for her knee. She had a shot. She was told she had to wait until she was 61 to have it replaced. She is continuing to fall and needs something done now. She doesn't know of any other drs in Dr Dyann Kief office. Please advise

## 2014-07-15 NOTE — Telephone Encounter (Signed)
LM for patient to call back.  Please give her following message: Dr. Konrad Felix is retired is there another MD at that practice she would like to see?  Also did she keep her appt with Raliegh Ip for her back?  Thanks [employer]

## 2014-07-16 ENCOUNTER — Emergency Department (HOSPITAL_COMMUNITY)
Admission: EM | Admit: 2014-07-16 | Discharge: 2014-07-16 | Disposition: A | Discharge status: Home / Self Care | Payer: Medicaid Other | Attending: Emergency Medicine | Admitting: Emergency Medicine

## 2014-07-16 ENCOUNTER — Encounter (HOSPITAL_COMMUNITY): Payer: Self-pay | Admitting: Emergency Medicine

## 2014-07-16 DIAGNOSIS — G8929 Other chronic pain: Secondary | ICD-10-CM | POA: Diagnosis not present

## 2014-07-16 DIAGNOSIS — I252 Old myocardial infarction: Secondary | ICD-10-CM | POA: Insufficient documentation

## 2014-07-16 DIAGNOSIS — K219 Gastro-esophageal reflux disease without esophagitis: Secondary | ICD-10-CM | POA: Insufficient documentation

## 2014-07-16 DIAGNOSIS — Z7982 Long term (current) use of aspirin: Secondary | ICD-10-CM | POA: Insufficient documentation

## 2014-07-16 DIAGNOSIS — E039 Hypothyroidism, unspecified: Secondary | ICD-10-CM | POA: Diagnosis not present

## 2014-07-16 DIAGNOSIS — M545 Low back pain, unspecified: Secondary | ICD-10-CM | POA: Insufficient documentation

## 2014-07-16 DIAGNOSIS — Z880 Allergy status to penicillin: Secondary | ICD-10-CM | POA: Insufficient documentation

## 2014-07-16 DIAGNOSIS — F411 Generalized anxiety disorder: Secondary | ICD-10-CM | POA: Insufficient documentation

## 2014-07-16 DIAGNOSIS — I5032 Chronic diastolic (congestive) heart failure: Secondary | ICD-10-CM | POA: Diagnosis not present

## 2014-07-16 DIAGNOSIS — E669 Obesity, unspecified: Secondary | ICD-10-CM | POA: Diagnosis not present

## 2014-07-16 DIAGNOSIS — M549 Dorsalgia, unspecified: Secondary | ICD-10-CM

## 2014-07-16 DIAGNOSIS — M129 Arthropathy, unspecified: Secondary | ICD-10-CM | POA: Diagnosis not present

## 2014-07-16 DIAGNOSIS — I10 Essential (primary) hypertension: Secondary | ICD-10-CM | POA: Insufficient documentation

## 2014-07-16 DIAGNOSIS — Z79899 Other long term (current) drug therapy: Secondary | ICD-10-CM | POA: Diagnosis not present

## 2014-07-16 DIAGNOSIS — J4489 Other specified chronic obstructive pulmonary disease: Secondary | ICD-10-CM | POA: Insufficient documentation

## 2014-07-16 DIAGNOSIS — F17200 Nicotine dependence, unspecified, uncomplicated: Secondary | ICD-10-CM | POA: Diagnosis not present

## 2014-07-16 DIAGNOSIS — J449 Chronic obstructive pulmonary disease, unspecified: Secondary | ICD-10-CM | POA: Diagnosis not present

## 2014-07-16 MED ORDER — HYDROCODONE-ACETAMINOPHEN 7.5-325 MG/15ML PO SOLN
10.0000 mL | Freq: Four times a day (QID) | ORAL | Status: DC | PRN
Start: 2014-07-16 — End: 2014-07-21

## 2014-07-16 MED ORDER — HYDROCODONE-ACETAMINOPHEN 7.5-325 MG/15ML PO SOLN
10.0000 mL | Freq: Once | ORAL | Status: AC
  Administered 2014-07-16: 10 mL via ORAL
  Filled 2014-07-16: qty 15

## 2014-07-16 MED ORDER — ONDANSETRON 4 MG PO TBDP
4.0000 mg | ORAL_TABLET | Freq: Once | ORAL | Status: AC
  Administered 2014-07-16: 4 mg via ORAL
  Filled 2014-07-16: qty 1

## 2014-07-16 NOTE — ED Notes (Signed)
Pt transported from home with c/o chronic back pain. Per EMS pt states she has not had issues for several months, pain began yesterday @ 0900, worse with movement.

## 2014-07-16 NOTE — Discharge Instructions (Signed)
You ave been given a prescription for 5 doses of pain medication -- enough to get you through tomorrow and allow you to see your PCP on Monday

## 2014-07-16 NOTE — ED Provider Notes (Signed)
CSN: [id number]     Arrival date & time 07/16/14  0316 History   First MD Initiated Contact with Patient 07/16/14 [phone]     Chief Complaint  Patient presents with  . Back Pain     (Consider location/radiation/quality/duration/timing/severity/associated sxs/prior Treatment) HPI Comments: Patient presents with lowback pain that started yesterday  Reports chronic back pain since a fall   This is the 23rd ED visiit this year for "pain "   Was seen by here  PCP 7/17 who deferred giving her additional pain medication until she was seen by ortho and was given a referral to physician of her choice in [place] who she had not made an appointment with as of yet   Patient is a 58 y.o. female presenting with back pain. The history is provided by the patient.  Back Pain Location:  Lumbar spine Quality:  Aching Radiates to:  Does not radiate Pain severity:  Moderate Pain is:  Same all the time Timing:  Constant Chronicity:  Chronic Relieved by:  Nothing Worsened by:  Standing and movement Ineffective treatments:  OTC medications Associated symptoms: no chest pain, no dysuria, no fever, no headaches and no weakness     Past Medical History  Diagnosis Date  . Chest pain     a. s/p reported MI in 1997;  b. 09/2012 Cath: nl cors, nl LV ([place]);  c. 05/2012 Non-ischemic myoview.  . Hypertension   . Arthritis   . Problem with literacy   . Shortness of breath   . Anxiety   . Bronchitis   . Chronic headaches   . Obesity   . COPD (chronic obstructive pulmonary disease)   . Chronic diastolic CHF (congestive heart failure)     a. 12/2011 Echo: nl ef, Gr 1 DD;  b. 11/2012 Echo: EF 45-50%, no significant valvular abnormalities.  Marland Kitchen GERD (gastroesophageal reflux disease)   . Myocardial infarction 1997    self reported  . Hypothyroidism   . Asthma   . Chronic chest pain    Past Surgical History  Procedure Laterality Date  . Ectopic pregnancy surgery      right  . Nm myoview ltd  June 2013     no reversible ischemia  . Cardiac catheterization  October 2012    No coronary artery disease  . Cardiac catheterization  2012    normal coronary arteries  . Nm myoview ltd  2012, 2013    normal  . 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 Knee arthroscopy  08/13/13    right   Family History  Problem Relation Age of Onset  . Cancer Mother 66    lung ca  . Diabetes Mother   . Anxiety disorder Sister   . Cancer Brother 60    lung  . Colon cancer Neg Hx   . Pancreatic cancer Neg Hx   . Rectal cancer Neg Hx   . Stomach cancer Neg Hx    History  Substance Use Topics  . Smoking status: Current Every Day Smoker -- 1.00 packs/day for 3 years    Last Attempt to Quit: 12/03/2010  . Smokeless tobacco: Never Used  . Alcohol Use: No   OB History   Grav Para Term Preterm Abortions TAB SAB Ect Mult Living                 Review of Systems  Constitutional: Negative for fever.  Respiratory:  Negative for shortness of breath.   Cardiovascular: Negative for chest pain and leg swelling.  Genitourinary: Negative for dysuria and frequency.  Musculoskeletal: Positive for back pain.  Skin: Negative for rash and wound.  Neurological: Negative for dizziness, weakness and headaches.  All other systems reviewed and are negative.     Allergies  Darvocet; Ibuprofen; Oxycodone; Tramadol; Celebrex; Naproxen; and Penicillins  Home Medications   Prior to Admission medications   Medication Sig Start Date End Date Taking? Authorizing Provider  aspirin EC 81 MG tablet Take 81 mg by mouth daily.   Yes Historical Provider, MD  atorvastatin (LIPITOR) 40 MG tablet Take 1 tablet (40 mg total) by mouth daily. 07/01/14  Yes Andrena Mews, MD  beclomethasone (QVAR) 40 MCG/ACT inhaler Inhale 2 puffs into the lungs 2 (two) times daily.   Yes Historical Provider, MD  levothyroxine (SYNTHROID, LEVOTHROID) 75 MCG tablet Take 1 tablet (75 mcg total) by mouth daily before  breakfast. 07/01/14  Yes Andrena Mews, MD  metoprolol succinate (TOPROL-XL) 50 MG 24 hr tablet Take 1 tablet (50 mg total) by mouth daily. Take with or immediately following a meal. 07/01/14  Yes Andrena Mews, MD  PARoxetine (PAXIL) 20 MG tablet Take 1 tablet (20 mg total) by mouth daily. 07/01/14  Yes Andrena Mews, MD  torsemide (DEMADEX) 20 MG tablet Take 20 mg by mouth.   Yes Historical Provider, MD  HYDROcodone-acetaminophen (HYCET) 7.5-325 mg/15 ml solution Take 10 mLs by mouth every 6 (six) hours as needed for moderate pain. 07/16/14   Garald Balding, NP   BP 115/79  Pulse 68  Temp(Src) 97.9 F (36.6 C) (Oral)  Resp 18  SpO2 99% Physical Exam  Vitals reviewed. Constitutional: She is oriented to person, place, and time. She appears well-developed and well-nourished.  HENT:  Head: Normocephalic.  Eyes: Pupils are equal, round, and reactive to light.  Neck: Normal range of motion.  Cardiovascular: Normal rate and regular rhythm.   Pulmonary/Chest: Effort normal and breath sounds normal.  Musculoskeletal: Normal range of motion.       Lumbar back: She exhibits tenderness.  Neurological: She is alert and oriented to person, place, and time.  Skin: Skin is warm. No rash noted. No erythema.    ED Course  Procedures (including critical care time) Labs Review Labs Reviewed - No data to display  Imaging Review No results found.   EKG Interpretation None      MDM  Will give Rx for 5 doses of Lortab elixir and allow her to follow up with her PCP on Monday  Patient givens contradictorily history of medication allergies most of complaints "nausea" Final diagnoses:  Chronic back pain         Garald Balding, NP 07/16/14 0400

## 2014-07-18 NOTE — ED Provider Notes (Signed)
Medical screening examination/treatment/procedure(s) were performed by non-physician practitioner and as supervising physician I was immediately available for consultation/collaboration.   EKG Interpretation None       Clotile Whittington, MD 07/18/14 1106 

## 2014-07-21 ENCOUNTER — Emergency Department (HOSPITAL_COMMUNITY)
Admission: EM | Admit: 2014-07-21 | Discharge: 2014-07-21 | Disposition: A | Discharge status: Home / Self Care | Payer: Medicaid Other | Attending: Emergency Medicine | Admitting: Emergency Medicine

## 2014-07-21 ENCOUNTER — Encounter (HOSPITAL_COMMUNITY): Payer: Self-pay | Admitting: Emergency Medicine

## 2014-07-21 ENCOUNTER — Emergency Department (HOSPITAL_COMMUNITY): Payer: Medicaid Other

## 2014-07-21 DIAGNOSIS — Z8719 Personal history of other diseases of the digestive system: Secondary | ICD-10-CM | POA: Insufficient documentation

## 2014-07-21 DIAGNOSIS — E669 Obesity, unspecified: Secondary | ICD-10-CM | POA: Diagnosis not present

## 2014-07-21 DIAGNOSIS — IMO0002 Reserved for concepts with insufficient information to code with codable children: Secondary | ICD-10-CM | POA: Insufficient documentation

## 2014-07-21 DIAGNOSIS — F17200 Nicotine dependence, unspecified, uncomplicated: Secondary | ICD-10-CM | POA: Insufficient documentation

## 2014-07-21 DIAGNOSIS — I252 Old myocardial infarction: Secondary | ICD-10-CM | POA: Diagnosis not present

## 2014-07-21 DIAGNOSIS — I10 Essential (primary) hypertension: Secondary | ICD-10-CM | POA: Insufficient documentation

## 2014-07-21 DIAGNOSIS — Z880 Allergy status to penicillin: Secondary | ICD-10-CM | POA: Diagnosis not present

## 2014-07-21 DIAGNOSIS — G8929 Other chronic pain: Secondary | ICD-10-CM | POA: Insufficient documentation

## 2014-07-21 DIAGNOSIS — Z79899 Other long term (current) drug therapy: Secondary | ICD-10-CM | POA: Diagnosis not present

## 2014-07-21 DIAGNOSIS — R079 Chest pain, unspecified: Secondary | ICD-10-CM | POA: Insufficient documentation

## 2014-07-21 DIAGNOSIS — J441 Chronic obstructive pulmonary disease with (acute) exacerbation: Secondary | ICD-10-CM | POA: Diagnosis not present

## 2014-07-21 DIAGNOSIS — J45901 Unspecified asthma with (acute) exacerbation: Secondary | ICD-10-CM | POA: Diagnosis not present

## 2014-07-21 DIAGNOSIS — M129 Arthropathy, unspecified: Secondary | ICD-10-CM | POA: Diagnosis not present

## 2014-07-21 DIAGNOSIS — M549 Dorsalgia, unspecified: Secondary | ICD-10-CM | POA: Diagnosis not present

## 2014-07-21 DIAGNOSIS — Z9889 Other specified postprocedural states: Secondary | ICD-10-CM | POA: Insufficient documentation

## 2014-07-21 DIAGNOSIS — I5032 Chronic diastolic (congestive) heart failure: Secondary | ICD-10-CM | POA: Diagnosis not present

## 2014-07-21 DIAGNOSIS — E039 Hypothyroidism, unspecified: Secondary | ICD-10-CM | POA: Insufficient documentation

## 2014-07-21 DIAGNOSIS — F411 Generalized anxiety disorder: Secondary | ICD-10-CM | POA: Diagnosis not present

## 2014-07-21 DIAGNOSIS — Z7982 Long term (current) use of aspirin: Secondary | ICD-10-CM | POA: Diagnosis not present

## 2014-07-21 LAB — BASIC METABOLIC PANEL
Anion gap: 11 (ref 5–15)
BUN: 13 mg/dL (ref 6–23)
CHLORIDE: 103 meq/L (ref 96–112)
CO2: 26 meq/L (ref 19–32)
Calcium: 9.5 mg/dL (ref 8.4–10.5)
Creatinine, Ser: 0.85 mg/dL (ref 0.50–1.10)
GFR calc Af Amer: 86 mL/min — ABNORMAL LOW (ref >90)
GFR calc non Af Amer: 75 mL/min — ABNORMAL LOW (ref >90)
GLUCOSE: 93 mg/dL (ref 70–99)
POTASSIUM: 4.3 meq/L (ref 3.7–5.3)
SODIUM: 140 meq/L (ref 137–147)

## 2014-07-21 LAB — CBC
HEMATOCRIT: 38 % (ref 36.0–46.0)
Hemoglobin: 12.3 g/dL (ref 12.0–15.0)
MCH: 27.8 pg (ref 26.0–34.0)
MCHC: 32.4 g/dL (ref 30.0–36.0)
MCV: 85.8 fL (ref 78.0–100.0)
Platelets: 273 10*3/uL (ref 150–400)
RBC: 4.43 MIL/uL (ref 3.87–5.11)
RDW: 14.7 % (ref 11.5–15.5)
WBC: 7.6 10*3/uL (ref 4.0–10.5)

## 2014-07-21 LAB — I-STAT TROPONIN, ED: TROPONIN I, POC: 0 ng/mL (ref 0.00–0.08)

## 2014-07-21 LAB — PRO B NATRIURETIC PEPTIDE: PRO B NATRI PEPTIDE: 124.1 pg/mL (ref 0–125)

## 2014-07-21 MED ORDER — METHOCARBAMOL 500 MG PO TABS: 500.0000 mg | ORAL_TABLET | Freq: Two times a day (BID) | ORAL | Status: DC

## 2014-07-21 MED ORDER — OXYCODONE-ACETAMINOPHEN 5-325 MG PO TABS
2.0000 | ORAL_TABLET | Freq: Once | ORAL | Status: AC
  Administered 2014-07-21: 2 via ORAL
  Filled 2014-07-21: qty 2

## 2014-07-21 NOTE — Discharge Instructions (Signed)
Take the prescribed medication as directed. Follow-up with your primary care physician as scheduled on august 11th or sooner if problems occur. Return to the ED for new or worsening symptoms.

## 2014-07-21 NOTE — ED Notes (Signed)
PA at bedside.

## 2014-07-21 NOTE — ED Notes (Signed)
Non productive cough x1 month. Right sided chest wall pain radiating under right shoulder. Tender to palpation. Pain worsened with movement. Hx MI (90s). Reports this doesn't feel like that pain. Took 324 mg aspirin prior to EMS arrival. VS: BP 128/82 HR 76 12 Lead unremarkable SpO2 97% on RA. Lungs clear. A&OX4. Ambulatory at scene.

## 2014-07-21 NOTE — ED Provider Notes (Signed)
Medical screening examination/treatment/procedure(s) were performed by non-physician practitioner and as supervising physician I was immediately available for consultation/collaboration.   EKG Interpretation   Date/Time:  Thursday July 21 2014 19:50:01 EDT Ventricular Rate:  66 PR Interval:  150 QRS Duration: 90 QT Interval:  423 QTC Calculation: 443 R Axis:   44 Text Interpretation:  Sinus rhythm Borderline low voltage, extremity leads  No significant change since last tracing Reconfirmed by Guled Gahan  MD,  Luci Bellucci ([id number]) on 07/21/2014 7:54:01 PM        Merryl Hacker, MD 07/21/14 2352

## 2014-07-21 NOTE — ED Notes (Addendum)
Initial contact-A&Ox4. Moving all extremities. Reports falling down the steps "a couple of nights ago and I hit my back. I'm having lower back pain in the middle of my back." Chest pain starting this morning in right chest radiating to right arm and underneath arm. Not on blood thinners. Reports feeling SOB at this time. SpO2 99% on RA. Dry cough x1 month. No sputum production. Smokes 1 pack/2weeks. Denies F, D, V but has had nausea and chills. In NAD. Awaiting PA/MD.

## 2014-07-21 NOTE — ED Provider Notes (Signed)
CSN: [id number]     Arrival date & time 07/21/14  1939 History   First MD Initiated Contact with Patient 07/21/14 1951     Chief Complaint  Patient presents with  . Chest Pain    chest wall     (Consider location/radiation/quality/duration/timing/severity/associated sxs/prior Treatment) The history is provided by the patient and medical records.   This is a 58 year old female with past medical history significant for hypertension, COPD, congestive heart failure, chronic chest pain, presenting to the ED for chest pain and cough. Patient is well known to myself from multiple ED visits for similar complaints. Patient states she's experiencing right-sided chest pain with radiation into her right shoulder. States she's had this pain for quite some time, does not seem to be changing in character. No alleviating or exacerbating factors.  Notes a nonproductive cough for the past month.  Denies any fever, chills, sweats, nausea, or vomiting. Patient states that she does feel mildly short of breath at this time. Patient also reports that she fell a few days ago who then hit her back against some carpeted stairs. He denies any head injury or loss of consciousness. She has had some pain in her low back without radiation.  No numbness, paresthesias or weakness of extremities.  No loss of bowel or bladder control.  Has been taking tylenol today without noted improvement.  VS stable on arrival.  Past Medical History  Diagnosis Date  . Chest pain     a. s/p reported MI in 1997;  b. 09/2012 Cath: nl cors, nl LV ([place]);  c. 05/2012 Non-ischemic myoview.  . Hypertension   . Arthritis   . Problem with literacy   . Shortness of breath   . Anxiety   . Bronchitis   . Chronic headaches   . Obesity   . COPD (chronic obstructive pulmonary disease)   . Chronic diastolic CHF (congestive heart failure)     a. 12/2011 Echo: nl ef, Gr 1 DD;  b. 11/2012 Echo: EF 45-50%, no significant valvular abnormalities.  Marland Kitchen GERD  (gastroesophageal reflux disease)   . Myocardial infarction 1997    self reported  . Hypothyroidism   . Asthma   . Chronic chest pain    Past Surgical History  Procedure Laterality Date  . Ectopic pregnancy surgery      right  . Nm myoview ltd  June 2013    no reversible ischemia  . Cardiac catheterization  October 2012    No coronary artery disease  . Cardiac catheterization  2012    normal coronary arteries  . Nm myoview ltd  2012, 2013    normal  . 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 Knee arthroscopy  08/13/13    right   Family History  Problem Relation Age of Onset  . Cancer Mother 64    lung ca  . Diabetes Mother   . Anxiety disorder Sister   . Cancer Brother 60    lung  . Colon cancer Neg Hx   . Pancreatic cancer Neg Hx   . Rectal cancer Neg Hx   . Stomach cancer Neg Hx    History  Substance Use Topics  . Smoking status: Current Every Day Smoker -- 1.00 packs/day for 3 years    Last Attempt to Quit: 12/03/2010  . Smokeless tobacco: Never Used  . Alcohol Use: No   OB History   Grav Para Term Preterm  Abortions TAB SAB Ect Mult Living                 Review of Systems  Respiratory: Positive for cough and shortness of breath.   Cardiovascular: Positive for chest pain.  Musculoskeletal: Positive for back pain.  All other systems reviewed and are negative.     Allergies  Darvocet; Ibuprofen; Tramadol; Tylenol; Celebrex; Naproxen; and Penicillins  Home Medications   Prior to Admission medications   Medication Sig Start Date End Date Taking? Authorizing Provider  aspirin EC 81 MG tablet Take 81 mg by mouth daily.   Yes Historical Provider, MD  atorvastatin (LIPITOR) 40 MG tablet Take 1 tablet (40 mg total) by mouth daily. 07/01/14  Yes Andrena Mews, MD  beclomethasone (QVAR) 40 MCG/ACT inhaler Inhale 2 puffs into the lungs 2 (two) times daily.   Yes Historical Provider, MD  levothyroxine (SYNTHROID, LEVOTHROID) 75  MCG tablet Take 1 tablet (75 mcg total) by mouth daily before breakfast. 07/01/14  Yes Andrena Mews, MD  metoprolol succinate (TOPROL-XL) 50 MG 24 hr tablet Take 1 tablet (50 mg total) by mouth daily. Take with or immediately following a meal. 07/01/14  Yes Andrena Mews, MD  PARoxetine (PAXIL) 20 MG tablet Take 1 tablet (20 mg total) by mouth daily. 07/01/14  Yes Andrena Mews, MD  torsemide (DEMADEX) 20 MG tablet Take 20 mg by mouth daily.    Yes Historical Provider, MD   BP 116/60  Pulse 68  Temp(Src) 97.9 F (36.6 C) (Oral)  Resp 14  SpO2 98%  Physical Exam  Nursing note and vitals reviewed. Constitutional: She is oriented to person, place, and time. She appears well-developed and well-nourished. No distress.  HENT:  Head: Normocephalic and atraumatic.  Mouth/Throat: Oropharynx is clear and moist.  Eyes: Conjunctivae and EOM are normal. Pupils are equal, round, and reactive to light.  Neck: Normal range of motion. Neck supple.  Cardiovascular: Normal rate, regular rhythm and normal heart sounds.   Pulmonary/Chest: Effort normal and breath sounds normal. No respiratory distress. She has no wheezes.  Chest pain reproducible with palpation to palpation to right anterior chest wall; no bruising or deformities  Abdominal: Soft. Bowel sounds are normal. There is no tenderness. There is no guarding.  Musculoskeletal: Normal range of motion. She exhibits no edema.  Endorses pain of lumbar paraspinal regions without focal tenderness; no deformities; no midline tenderness, step-off, or deformities noted; full ROM maintained; DP pulses and sensation intact BLE; normal strength throughout  Neurological: She is alert and oriented to person, place, and time.  Skin: Skin is warm and dry. She is not diaphoretic.  Psychiatric: She has a normal mood and affect.    ED Course  Procedures (including critical care time) Labs Review Labs Reviewed  BASIC METABOLIC PANEL - Abnormal; Notable for the  following:    GFR calc non Af Amer 75 (*)    GFR calc Af Amer 86 (*)    All other components within normal limits  CBC  PRO B NATRIURETIC PEPTIDE  I-STAT TROPOININ, ED    Imaging Review Dg Chest Port 1 View  07/21/2014   CLINICAL DATA:  Chest pain  EXAM: PORTABLE CHEST - 1 VIEW  COMPARISON:  06/28/2014  FINDINGS: Mild cardiomegaly. No definite edema or pneumonia. Low lung volumes evident. No effusion or pneumothorax. Trachea midline. Monitor leads overlie the chest.  IMPRESSION: Cardiomegaly.  Low volume exam.   Electronically Signed   By: Daryll Brod M.D.   On: 07/21/2014  20:23     EKG Interpretation   Date/Time:  Thursday July 21 2014 19:50:01 EDT Ventricular Rate:  66 PR Interval:  150 QRS Duration: 90 QT Interval:  423 QTC Calculation: 443 R Axis:   44 Text Interpretation:  Sinus rhythm Borderline low voltage, extremity leads  No significant change since last tracing Reconfirmed by HORTON  MD,  Loma Sousa ([id number]) on 07/21/2014 7:54:01 PM      MDM   Final diagnoses:  Chest pain, unspecified chest pain type  Back pain, unspecified location    58 year old female well known to myself from prior ED visit. She has a history of chronic chest and back pain. On exam today, her pain is consistent with prior episodes and is reproducible on exam. She complains of low back pain after a fall a few days ago, no visible injuries, deformities, or signs of trauma noted on exam. Back pain without any red flag symptoms to suggest cauda equina, spinal cord injury, or other serious pathology. Patient neurologically intact. EKG sinus rhythm without ischemic changes. By work is reassuring. Chest x-ray is clear.  This time I have low suspicion for ACS, PE, dissection, or other acute cardiac event. Patient given dose of Percocet in the ED with improvement of her pain. She will discharged home with Robaxin. She has previously scheduled followup with her primary care physician on August 11, encourage her  to followup sooner if problems occur.  Larene Pickett, PA-C 07/21/14 2334

## 2014-07-26 ENCOUNTER — Ambulatory Visit: Payer: Medicaid Other | Admitting: Family Medicine

## 2014-07-30 ENCOUNTER — Encounter (HOSPITAL_COMMUNITY): Payer: Self-pay | Admitting: Emergency Medicine

## 2014-07-30 ENCOUNTER — Emergency Department (HOSPITAL_COMMUNITY)
Admission: EM | Admit: 2014-07-30 | Discharge: 2014-07-30 | Disposition: A | Discharge status: Home / Self Care | Payer: Medicaid Other | Attending: Emergency Medicine | Admitting: Emergency Medicine

## 2014-07-30 ENCOUNTER — Emergency Department (HOSPITAL_COMMUNITY): Payer: Medicaid Other

## 2014-07-30 DIAGNOSIS — G8929 Other chronic pain: Secondary | ICD-10-CM | POA: Insufficient documentation

## 2014-07-30 DIAGNOSIS — R079 Chest pain, unspecified: Secondary | ICD-10-CM | POA: Diagnosis present

## 2014-07-30 DIAGNOSIS — F17200 Nicotine dependence, unspecified, uncomplicated: Secondary | ICD-10-CM | POA: Diagnosis not present

## 2014-07-30 DIAGNOSIS — J45901 Unspecified asthma with (acute) exacerbation: Secondary | ICD-10-CM | POA: Insufficient documentation

## 2014-07-30 DIAGNOSIS — Z9889 Other specified postprocedural states: Secondary | ICD-10-CM | POA: Diagnosis not present

## 2014-07-30 DIAGNOSIS — E669 Obesity, unspecified: Secondary | ICD-10-CM | POA: Insufficient documentation

## 2014-07-30 DIAGNOSIS — I5032 Chronic diastolic (congestive) heart failure: Secondary | ICD-10-CM | POA: Insufficient documentation

## 2014-07-30 DIAGNOSIS — R071 Chest pain on breathing: Secondary | ICD-10-CM | POA: Diagnosis not present

## 2014-07-30 DIAGNOSIS — J441 Chronic obstructive pulmonary disease with (acute) exacerbation: Secondary | ICD-10-CM | POA: Insufficient documentation

## 2014-07-30 DIAGNOSIS — Z791 Long term (current) use of non-steroidal anti-inflammatories (NSAID): Secondary | ICD-10-CM | POA: Diagnosis not present

## 2014-07-30 DIAGNOSIS — Z79899 Other long term (current) drug therapy: Secondary | ICD-10-CM | POA: Insufficient documentation

## 2014-07-30 DIAGNOSIS — M129 Arthropathy, unspecified: Secondary | ICD-10-CM | POA: Diagnosis not present

## 2014-07-30 DIAGNOSIS — F411 Generalized anxiety disorder: Secondary | ICD-10-CM | POA: Diagnosis not present

## 2014-07-30 DIAGNOSIS — I252 Old myocardial infarction: Secondary | ICD-10-CM | POA: Diagnosis not present

## 2014-07-30 DIAGNOSIS — I10 Essential (primary) hypertension: Secondary | ICD-10-CM | POA: Insufficient documentation

## 2014-07-30 DIAGNOSIS — Z7982 Long term (current) use of aspirin: Secondary | ICD-10-CM | POA: Diagnosis not present

## 2014-07-30 DIAGNOSIS — Z880 Allergy status to penicillin: Secondary | ICD-10-CM | POA: Diagnosis not present

## 2014-07-30 DIAGNOSIS — Z8719 Personal history of other diseases of the digestive system: Secondary | ICD-10-CM | POA: Insufficient documentation

## 2014-07-30 DIAGNOSIS — J40 Bronchitis, not specified as acute or chronic: Secondary | ICD-10-CM | POA: Insufficient documentation

## 2014-07-30 DIAGNOSIS — E039 Hypothyroidism, unspecified: Secondary | ICD-10-CM | POA: Insufficient documentation

## 2014-07-30 LAB — I-STAT CHEM 8, ED
BUN: 13 mg/dL (ref 6–23)
CHLORIDE: 109 meq/L (ref 96–112)
Calcium, Ion: 1.14 mmol/L (ref 1.12–1.23)
Creatinine, Ser: 0.8 mg/dL (ref 0.50–1.10)
Glucose, Bld: 84 mg/dL (ref 70–99)
HEMATOCRIT: 37 % (ref 36.0–46.0)
Hemoglobin: 12.6 g/dL (ref 12.0–15.0)
POTASSIUM: 3.8 meq/L (ref 3.7–5.3)
SODIUM: 138 meq/L (ref 137–147)
TCO2: 24 mmol/L (ref 0–100)

## 2014-07-30 LAB — I-STAT TROPONIN, ED: TROPONIN I, POC: 0 ng/mL (ref 0.00–0.08)

## 2014-07-30 LAB — PRO B NATRIURETIC PEPTIDE: PRO B NATRI PEPTIDE: 37.9 pg/mL (ref 0–125)

## 2014-07-30 MED ORDER — METHOCARBAMOL 500 MG PO TABS: 1000.0000 mg | ORAL_TABLET | Freq: Four times a day (QID) | ORAL | Status: DC | PRN

## 2014-07-30 MED ORDER — OXYCODONE-ACETAMINOPHEN 5-325 MG PO TABS
2.0000 | ORAL_TABLET | Freq: Once | ORAL | Status: AC
  Administered 2014-07-30: 2 via ORAL
  Filled 2014-07-30: qty 2

## 2014-07-30 NOTE — Discharge Instructions (Signed)
°Emergency Department Resource Guide °1) Find a Doctor and Pay Out of Pocket °Although you won't have to find out who is covered by your insurance plan, it is a good idea to ask around and get recommendations. You will then need to call the office and see if the doctor you have chosen will accept you as a new patient and what types of options they offer for patients who are self-pay. Some doctors offer discounts or will set up payment plans for their patients who do not have insurance, but you will need to ask so you aren't surprised when you get to your appointment. ° °2) Contact Your Local Health Department °Not all health departments have doctors that can see patients for sick visits, but many do, so it is worth a call to see if yours does. If you don't know where your local health department is, you can check in your phone book. The CDC also has a tool to help you locate your state's health department, and many state websites also have listings of all of their local health departments. ° °3) Find a Walk-in Clinic °If your illness is not likely to be very severe or complicated, you may want to try a walk in clinic. These are popping up all over the country in pharmacies, drugstores, and shopping centers. They're usually staffed by nurse practitioners or physician assistants that have been trained to treat common illnesses and complaints. They're usually fairly quick and inexpensive. However, if you have serious medical issues or chronic medical problems, these are probably not your best option. ° °No Primary Care Doctor: °- Call [facility] at  832-8000 - they can help you locate a primary care doctor that  accepts your insurance, provides certain services, etc. °- Physician Referral Service- 1-800-533-3463 ° °Chronic Pain Problems: °Organization         Address  Phone   Notes  °[facility]  ([phone]) [phone] Patients need to be referred by their primary care doctor.  ° °Medication  Assistance: °Organization         Address  Phone   Notes  °[place] [facility] [address] °[place], [place] [address] (336) 641-8030 --Must be a resident of [place] °-- Must have NO insurance coverage whatsoever (no Medicaid/ Medicare, etc.) °-- The pt. MUST have a primary care doctor that directs their care regularly and follows them in the community °  °[facility]  (866) 331-1348   °[employer]  (888) 892-1162   ° °Agencies that provide inexpensive medical care: °Organization         Address  Phone   Notes  °[facility]  (336) 832-8035   °[facility]    (336) 832-7272   °[facility] [address] °[place], [place] [address] (336) 832-4777   °[facility] [address], °[place] (336) 271-4999   °[facility]    (336) 373-0678   °[facility]    (336) 272-1050   °[facility] ° [address], [place] Phone:  (336) 832-4444, Fax:  (336) 832-4440 Hours of Operation:  9 am - 6 pm, M-F.  Also accepts Medicaid/Medicare and self-pay.  °[facility] ° [address],  Phone: (336) 832-3150, Fax: (336) [phone]. Hours of Operation:  8:30 am - 5:30 pm, M-F.  Also accepts Medicaid and self-pay.  °[facility] [place] [address], [place] Phone: (336) 878-6027   °[facility] [address], [place], [place] (336)723-1848, Ext. 123 Mondays & Thursdays: 7-9 AM.  First 15 patients are seen on a first come, first serve basis. °  ° °Medicaid-accepting [place] Providers: ° °Organization         Address  Phone   Notes  °[facility] [address], [place] (336) 641-2100 Also accepts self-pay patients.  °[facility] [address], [place] ° (336) 856-9996   °[facility] [address], [place]  (336) 288-8857   °[facility] [address] [place] [address], [place] (336) 299-7000   °Veita Bland [address], [place]  ° (336) 373-1557 Only accepts [place] Access Medicaid patients after they have their name applied to their card.  ° °Self-Pay (no insurance) in [place]: ° °Organization         Address  Phone   Notes  °Sickle Cell Patients, [facility] [address], [place] (336) 832-1970   °[facility] [address], [place] (336) 832-4400   °[facility] [place] ° [address], [place] (336) 992-4800   °[facility]/Dr. Osei-Bonsu ° [address] [place] [address], [place] or [address], [place] (336) [phone] Phone number for both [place] and [place] locations is the same.  °[facility] [address], [place] (336) 299-0000   °[facility] [address] [place] [address], [place] or [address] (336) 852-7530 °(336) 878-2260   °[facility] [address], [place] (336) 350-1642, phone; (336) 294-5005, fax Sees patients 1st and 3rd Saturday of every month.  Must not qualify for public or private insurance (i.e. Medicaid, Medicare, [place] Health Choice, Veterans' Benefits) • Household income should be no more than 200% of the poverty level •The clinic cannot treat you if you are pregnant or think you are pregnant • Sexually transmitted diseases are not treated at the clinic.  ° ° °Dental Care: °Organization         Address  Phone  Notes  °[place] [facility] [address], [place] (336) 641-6152 Accepts children up to age 21 who are enrolled in Medicaid or [place] Health Choice; pregnant women with a Medicaid card; and children who have applied for Medicaid or [place] Health Choice, but were declined, whose parents can pay a reduced fee at time of service.  °[place]  [facility] [place]  [address], [place] (336) 641-7733 Accepts children up to age 21 who are enrolled in Medicaid or [place] Health Choice; pregnant women with a Medicaid card; and children who have applied for Medicaid or [place] Health Choice, but were declined, whose parents can pay a reduced fee at time of service.  °[facility] ° [address],  (336) 641-4533 Patients are seen by appointment only. Walk-ins are not accepted. [facility] will see patients 18 years of age and older. °Monday - Tuesday (8am-5pm) °Most Wednesdays (8:30-5pm) °$30 per visit, cash only  °[facility] ° [address], [place] (336) [phone] Patients are seen by appointment only. Walk-ins are not accepted. [facility] will see patients 18 years of age and older. °One   Wednesday Evening (Monthly: Volunteer Based).  $30 per visit, cash only  °[facility]  (919) 537-3737 for adults; Children under age 4, call Graduate Pediatric Dentistry at ([phone]) [phone]. Children aged 4-14, please call ([phone]) [phone] to request a pediatric application. ° Dental services are provided in all areas of dental care including fillings, crowns and bridges, complete and partial dentures, implants, gum treatment, root canals, and extractions. Preventive care is also provided. Treatment is provided to both adults and children. °Patients are selected via a lottery and there is often a waiting list. °  °[facility] [address], °[place] ° (336) 763-8833 [email or web address] °  °[facility] [address], [place], [place] (336)723-1848, Ext. 123 Second and Fourth Thursday of each month, opens at 6:30 AM; Clinic ends at 9 AM.  Patients are seen on a first-come first-served basis, and a limited number are seen during each clinic.  ° °[facility] ° [address], [place], [place] (336) 723-7904    Eligibility Requirements °You must have lived in [place], [place], or [place] counties for at least the last three months. °  You cannot be eligible for state or federal sponsored healthcare insurance, including Veterans Administration, Medicaid, or Medicare. °  You generally cannot be eligible for healthcare insurance through your employer.  °  How to apply: °Eligibility screenings are held every Tuesday and Wednesday afternoon from 1:00 pm until 4:00 pm. You do not need an appointment for the interview!  °[facility] [address], [place], [place] 336-631-2330   °[facility]  336-342-8273   °[facility]  336-703-3100   °[facility]  336-570-6415   ° °Behavioral Health Resources in the Community: °Intensive Outpatient Programs °Organization         Address  Phone  Notes  °[facility] [address], [place], [place] 336-878-6098   °[facility] [address], [place], [place] 336-832-9800   °[facility] [address], [place], [place] ° 336-882-2125   °[facility] [address],  °[place], [place] 1-800-853-5163 or 336-641-4981   °Substance Abuse Resources °Organization         Address  Phone  Notes  °[facility]  336-882-2125   °[facility]  336-784-9470   °[facility]  336-285-9073   °[facility]  336-845-3988   °Residential & Outpatient Substance Abuse Program  1-800-659-3381   °Psychological Services °Organization         Address  Phone  Notes  °[facility]  336- 832-9600   °[facility]  336- 378-7881   °[facility] [address], [place] 1-800-853-5163 or 336-641-4981   ° °Mobile Crisis Teams °Organization         Address  Phone  Notes  °[facility], Mobile Crisis Care Unit  1-877-626-1772   °Assertive °[facility] ° [address]  [place], [place] 336-834-9664   °Sharon DeEsch [address] °[place] 336-554-5454   ° °Self-Help/Support Groups °Organization         Address  Phone             Notes  °[facility] [place] - variety of support groups  336- 373-1402 Call for more information  °Narcotics Anonymous (NA), [facility] [address], °[place]  2 meetings at this location  ° °  Residential Treatment Programs Organization         Address  Phone  Notes  [facility] [address]    [place]  1-(201)688-2869   [facility]  [address] [place] [address], [place], [place]   [place], [place] (215) 758-2615 Admissions: 8am-3pm M-F  [facility] [place] [address]    [place], [place] 914-782-9562   [facility] [address] [place], [place], [place]   [facility] [address]  [place], [place]   [facility] Intensive Outpatient [address], [place], [place]   [facility] ([place] [address]  [place], [place] 1-416-329-1189 or 204-664-1841   [facility] [address], [place], [place] Accepts Medicaid  [facility] [address],  [place] 1-803-852-7566 Substance Abuse/Addiction Treatment   [facility] Organization         Address  Phone  Notes  [facility]  (570)117-8498   Domenic Schwab, PhD [address] [place], [place]   (720) 699-0814 or [phone]   [facility] [place], [place] 217-709-6849   [facility] [address] [place], [place] (831)354-2340 Insurance/Medicaid/sponsorship through [facility] and Families [address]                                    [place], [place] [phone] [place] [address], [place] [phone]    Dr. Adele Schilder  [phone]   [facility] Dept. 1) [address], [place] 2) [place] 3)  [address], [place] [phone]  [phone]   [place] 912-645-0964 or [phone] (After Hours)      Take the prescription as directed.  Apply moist heat or ice to the area(s) of discomfort, for 15 minutes at a time, several times per day for the next few days.  Do not fall asleep on a heating or ice pack.  Call your regular medical doctor on Monday to schedule a follow up appointment in the next 2 days.  Return to the Emergency Department immediately if worsening.

## 2014-07-30 NOTE — ED Notes (Signed)
Per [facility], pt has been experiencing chest pain since last night. Pt describes the pain as sharp 10/10. Pt had 324mg  of aspirin at home, no nitro given due to issues with headaches. Pt c/o nausea and vomiting last night as well as today. History of MI in 1996, pt also has history of COPD, asthma, hypertension, and hyperlipidemia. Pt has not had any regular medications in 3-4 days. Vital signs stable with EMS, no distress noted.

## 2014-07-30 NOTE — ED Provider Notes (Signed)
CSN: [id number]     Arrival date & time 07/30/14  1522 History   First MD Initiated Contact with Patient 07/30/14 1537     Chief Complaint  Patient presents with  . Chest Pain      HPI Pt was seen at 1545. Per pt, c/o gradual onset and persistence of constant acute flair of her chronic right sided chest wall "pain" for the past 2 days. Has been associated with 2 episodes of N/V since yesterday. Denies any change in her usual chronic chest wall pain. Denies SOB/cough, no abd pain, no diarrhea, no black or blood in stools or emesis, no back pain, no rash, no fevers, no injury. The symptoms have been associated with no other complaints. The patient has a significant history of similar symptoms previously, recently being evaluated for this complaint and multiple prior evals for same. Pt has had 23 ED visits for same symptoms over the past 6 months with her last ED visit on 07/21/2014. Pt states she did not f/u with her PMD after her last ED visit.     Past Medical History  Diagnosis Date  . Chest pain     a. s/p reported MI in 1997;  b. 09/2012 Cath: nl cors, nl LV ([place]);  c. 05/2012 Non-ischemic myoview.  . Hypertension   . Arthritis   . Problem with literacy   . Shortness of breath   . Anxiety   . Bronchitis   . Chronic headaches   . Obesity   . COPD (chronic obstructive pulmonary disease)   . Chronic diastolic CHF (congestive heart failure)     a. 12/2011 Echo: nl ef, Gr 1 DD;  b. 11/2012 Echo: EF 45-50%, no significant valvular abnormalities.  Marland Kitchen GERD (gastroesophageal reflux disease)   . Myocardial infarction 1997    self reported  . Hypothyroidism   . Asthma   . Chronic chest pain    Past Surgical History  Procedure Laterality Date  . Ectopic pregnancy surgery      right  . Nm myoview ltd  June 2013    no reversible ischemia  . Cardiac catheterization  October 2012    No coronary artery disease  . Cardiac catheterization  2012    normal coronary arteries  . Nm myoview  ltd  2012, 2013    normal  . 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 Knee arthroscopy  08/13/13    right   Family History  Problem Relation Age of Onset  . Cancer Mother 69    lung ca  . Diabetes Mother   . Anxiety disorder Sister   . Cancer Brother 60    lung  . Colon cancer Neg Hx   . Pancreatic cancer Neg Hx   . Rectal cancer Neg Hx   . Stomach cancer Neg Hx    History  Substance Use Topics  . Smoking status: Current Every Day Smoker -- 0.20 packs/day  . Smokeless tobacco: Never Used  . Alcohol Use: No    Review of Systems ROS: Statement: All systems negative except as marked or noted in the HPI; Constitutional: Negative for fever and chills. ; ; Eyes: Negative for eye pain, redness and discharge. ; ; ENMT: Negative for ear pain, hoarseness, nasal congestion, sinus pressure and sore throat. ; ; Cardiovascular: Negative for palpitations, diaphoresis, dyspnea and peripheral edema. ; ; Respiratory: Negative for cough, wheezing and stridor. ; ; Gastrointestinal: +N/V.  Negative for diarrhea, abdominal pain, blood in stool, hematemesis, jaundice and rectal bleeding. . ; ; Genitourinary: Negative for dysuria, flank pain and hematuria. ; ; Musculoskeletal: +chest wall pain. Negative for back pain and neck pain. Negative for swelling and trauma.; ; Skin: Negative for pruritus, rash, abrasions, blisters, bruising and skin lesion.; ; Neuro: Negative for headache, lightheadedness and neck stiffness. Negative for weakness, altered level of consciousness , altered mental status, extremity weakness, paresthesias, involuntary movement, seizure and syncope.      Allergies  Darvocet; Ibuprofen; Tramadol; Tylenol; Celebrex; Naproxen; and Penicillins  Home Medications   Prior to Admission medications   Medication Sig Start Date End Date Taking? Authorizing Provider  aspirin EC 81 MG tablet Take 81 mg by mouth daily.   Yes Historical Provider, MD  atorvastatin  (LIPITOR) 40 MG tablet Take 1 tablet (40 mg total) by mouth daily. 07/01/14  Yes Andrena Mews, MD  beclomethasone (QVAR) 40 MCG/ACT inhaler Inhale 2 puffs into the lungs 2 (two) times daily.   Yes Historical Provider, MD  levothyroxine (SYNTHROID, LEVOTHROID) 75 MCG tablet Take 1 tablet (75 mcg total) by mouth daily before breakfast. 07/01/14  Yes Andrena Mews, MD  methocarbamol (ROBAXIN) 500 MG tablet Take 1 tablet (500 mg total) by mouth 2 (two) times daily. 07/21/14  Yes Larene Pickett, PA-C  metoprolol succinate (TOPROL-XL) 50 MG 24 hr tablet Take 1 tablet (50 mg total) by mouth daily. Take with or immediately following a meal. 07/01/14  Yes Andrena Mews, MD  PARoxetine (PAXIL) 20 MG tablet Take 1 tablet (20 mg total) by mouth daily. 07/01/14  Yes Andrena Mews, MD  torsemide (DEMADEX) 20 MG tablet Take 20 mg by mouth daily.    Yes Historical Provider, MD   BP 117/60  Pulse 71  Temp(Src) 97.9 F (36.6 C) (Oral)  Resp 16  Ht 5\' 8"  (1.727 m)  Wt 214 lb (97.07 kg)  BMI 32.55 kg/m2  SpO2 98% Physical Exam 1550: Physical examination:  Nursing notes reviewed; Vital signs and O2 SAT reviewed;  Constitutional: Well developed, Well nourished, Well hydrated, In no acute distress; Head:  Normocephalic, atraumatic; Eyes: EOMI, PERRL, No scleral icterus; ENMT: Mouth and pharynx normal, Mucous membranes moist; Neck: Supple, Full range of motion, No lymphadenopathy; Cardiovascular: Regular rate and rhythm, No murmur, rub, or gallop; Respiratory: Breath sounds clear & equal bilaterally, No rales, rhonchi, wheezes.  Speaking full sentences with ease, Normal respiratory effort/excursion; Chest: +right upper anterior-lateral chest wall tender to palp. No soft tissue crepitus, no deformity, no rash. Movement normal; Abdomen: Soft, Nontender, Nondistended, Normal bowel sounds; Genitourinary: No CVA tenderness; Spine:  No midline CS, TS, LS tenderness.;; Extremities: Pulses normal, No tenderness, No edema, No calf  edema or asymmetry.; Neuro: AA&Ox3, Major CN grossly intact.  Speech clear. No gross focal motor or sensory deficits in extremities.; Skin: Color normal, Warm, Dry.   ED Course  Procedures     EKG Interpretation   Date/Time:  Saturday July 30 2014 15:28:38 EDT Ventricular Rate:  69 PR Interval:  154 QRS Duration: 92 QT Interval:  434 QTC Calculation: 465 R Axis:   34 Text Interpretation:  Sinus rhythm When compared with ECG of 07/21/2014 No  significant change was found Confirmed by [facility]  MD, Nunzio Cory [phone])  on 07/30/2014 4:04:28 PM      MDM  MDM Reviewed: previous chart, nursing note and vitals Reviewed previous: labs and ECG Interpretation: labs, ECG and x-ray     Results for orders placed during  the hospital encounter of 07/30/14  PRO B NATRIURETIC PEPTIDE      Result Value Ref Range   Pro B Natriuretic peptide (BNP) 37.9  0 - 125 pg/mL  I-STAT CHEM 8, ED      Result Value Ref Range   Sodium 138  137 - 147 mEq/L   Potassium 3.8  3.7 - 5.3 mEq/L   Chloride 109  96 - 112 mEq/L   BUN 13  6 - 23 mg/dL   Creatinine, Ser 0.80  0.50 - 1.10 mg/dL   Glucose, Bld 84  70 - 99 mg/dL   Calcium, Ion 1.14  1.12 - 1.23 mmol/L   TCO2 24  0 - 100 mmol/L   Hemoglobin 12.6  12.0 - 15.0 g/dL   HCT 37.0  36.0 - 46.0 %  I-STAT TROPOININ, ED      Result Value Ref Range   Troponin i, poc 0.00  0.00 - 0.08 ng/mL   Comment 3            Dg Chest 2 View 07/30/2014   CLINICAL DATA:  Chest pain for 2 days. History of COPD. Asthma. CHF. Smoker.  EXAM: CHEST  2 VIEW  COMPARISON:  07/21/2014  FINDINGS: Midline trachea. Normal heart size and mediastinal contours. No pleural effusion or pneumothorax. Mildly low lung volumes. Mild volume loss at the lung bases.  IMPRESSION: No acute cardiopulmonary disease.   Electronically Signed   By: Abigail Miyamoto M.D.   On: 07/30/2014 17:23    1800:  Doubt PE as cause for symptoms with low risk [place].  Doubt ACS as cause for symptoms with normal  troponin and unchanged EKG from previous after 2 days of constant symptoms. Has tol PO well in the ED without N/V. Feels better after pain meds and wants to go home now. Long hx of chronic pain with multiple ED visits for same.  Pt endorses acute flair of her usual long standing chronic pain today, no change from her usual chronic pain pattern.  Pt encouraged to f/u with her PMD and Pain Management doctor for good continuity of care and control of her chronic pain.  Verb understanding.    Francine Graven, DO 08/02/14 [phone]

## 2014-08-03 ENCOUNTER — Emergency Department (HOSPITAL_COMMUNITY): Payer: Medicaid Other

## 2014-08-03 ENCOUNTER — Emergency Department (HOSPITAL_COMMUNITY)
Admission: EM | Admit: 2014-08-03 | Discharge: 2014-08-03 | Disposition: A | Discharge status: Home / Self Care | Payer: Medicaid Other | Attending: Emergency Medicine | Admitting: Emergency Medicine

## 2014-08-03 ENCOUNTER — Encounter (HOSPITAL_COMMUNITY): Payer: Self-pay | Admitting: Emergency Medicine

## 2014-08-03 DIAGNOSIS — I252 Old myocardial infarction: Secondary | ICD-10-CM | POA: Insufficient documentation

## 2014-08-03 DIAGNOSIS — I5032 Chronic diastolic (congestive) heart failure: Secondary | ICD-10-CM | POA: Insufficient documentation

## 2014-08-03 DIAGNOSIS — E039 Hypothyroidism, unspecified: Secondary | ICD-10-CM | POA: Diagnosis not present

## 2014-08-03 DIAGNOSIS — Y9389 Activity, other specified: Secondary | ICD-10-CM | POA: Diagnosis not present

## 2014-08-03 DIAGNOSIS — Y9289 Other specified places as the place of occurrence of the external cause: Secondary | ICD-10-CM | POA: Insufficient documentation

## 2014-08-03 DIAGNOSIS — S335XXA Sprain of ligaments of lumbar spine, initial encounter: Secondary | ICD-10-CM | POA: Insufficient documentation

## 2014-08-03 DIAGNOSIS — Z79899 Other long term (current) drug therapy: Secondary | ICD-10-CM | POA: Diagnosis not present

## 2014-08-03 DIAGNOSIS — F411 Generalized anxiety disorder: Secondary | ICD-10-CM | POA: Diagnosis not present

## 2014-08-03 DIAGNOSIS — F17200 Nicotine dependence, unspecified, uncomplicated: Secondary | ICD-10-CM | POA: Diagnosis not present

## 2014-08-03 DIAGNOSIS — Z880 Allergy status to penicillin: Secondary | ICD-10-CM | POA: Insufficient documentation

## 2014-08-03 DIAGNOSIS — IMO0002 Reserved for concepts with insufficient information to code with codable children: Secondary | ICD-10-CM | POA: Diagnosis present

## 2014-08-03 DIAGNOSIS — Z7982 Long term (current) use of aspirin: Secondary | ICD-10-CM | POA: Diagnosis not present

## 2014-08-03 DIAGNOSIS — R112 Nausea with vomiting, unspecified: Secondary | ICD-10-CM | POA: Insufficient documentation

## 2014-08-03 DIAGNOSIS — Z9889 Other specified postprocedural states: Secondary | ICD-10-CM | POA: Insufficient documentation

## 2014-08-03 DIAGNOSIS — S43402A Unspecified sprain of left shoulder joint, initial encounter: Secondary | ICD-10-CM

## 2014-08-03 DIAGNOSIS — S39012A Strain of muscle, fascia and tendon of lower back, initial encounter: Secondary | ICD-10-CM

## 2014-08-03 DIAGNOSIS — W108XXA Fall (on) (from) other stairs and steps, initial encounter: Secondary | ICD-10-CM | POA: Diagnosis not present

## 2014-08-03 DIAGNOSIS — G8929 Other chronic pain: Secondary | ICD-10-CM | POA: Diagnosis not present

## 2014-08-03 DIAGNOSIS — E669 Obesity, unspecified: Secondary | ICD-10-CM | POA: Insufficient documentation

## 2014-08-03 DIAGNOSIS — J449 Chronic obstructive pulmonary disease, unspecified: Secondary | ICD-10-CM | POA: Diagnosis not present

## 2014-08-03 DIAGNOSIS — I10 Essential (primary) hypertension: Secondary | ICD-10-CM | POA: Insufficient documentation

## 2014-08-03 DIAGNOSIS — M129 Arthropathy, unspecified: Secondary | ICD-10-CM | POA: Diagnosis not present

## 2014-08-03 DIAGNOSIS — Z8719 Personal history of other diseases of the digestive system: Secondary | ICD-10-CM | POA: Insufficient documentation

## 2014-08-03 DIAGNOSIS — J4489 Other specified chronic obstructive pulmonary disease: Secondary | ICD-10-CM | POA: Insufficient documentation

## 2014-08-03 LAB — COMPREHENSIVE METABOLIC PANEL
ALBUMIN: 4.2 g/dL (ref 3.5–5.2)
ALT: 15 U/L (ref 0–35)
AST: 22 U/L (ref 0–37)
Alkaline Phosphatase: 99 U/L (ref 39–117)
Anion gap: 14 (ref 5–15)
BUN: 14 mg/dL (ref 6–23)
CALCIUM: 9.5 mg/dL (ref 8.4–10.5)
CHLORIDE: 103 meq/L (ref 96–112)
CO2: 24 mEq/L (ref 19–32)
Creatinine, Ser: 0.73 mg/dL (ref 0.50–1.10)
GFR calc Af Amer: >90 mL/min (ref >90)
GFR calc non Af Amer: >90 mL/min (ref >90)
Glucose, Bld: 92 mg/dL (ref 70–99)
Potassium: 4.3 mEq/L (ref 3.7–5.3)
Sodium: 141 mEq/L (ref 137–147)
Total Bilirubin: 0.4 mg/dL (ref 0.3–1.2)
Total Protein: 7.7 g/dL (ref 6.0–8.3)

## 2014-08-03 LAB — CBC WITH DIFFERENTIAL/PLATELET
BASOS ABS: 0.1 10*3/uL (ref 0.0–0.1)
BASOS PCT: 1 % (ref 0–1)
EOS PCT: 4 % (ref 0–5)
Eosinophils Absolute: 0.2 10*3/uL (ref 0.0–0.7)
HCT: 41.3 % (ref 36.0–46.0)
Hemoglobin: 13.6 g/dL (ref 12.0–15.0)
Lymphocytes Relative: 33 % (ref 12–46)
Lymphs Abs: 1.9 10*3/uL (ref 0.7–4.0)
MCH: 27.8 pg (ref 26.0–34.0)
MCHC: 32.9 g/dL (ref 30.0–36.0)
MCV: 84.5 fL (ref 78.0–100.0)
Monocytes Absolute: 0.5 10*3/uL (ref 0.1–1.0)
Monocytes Relative: 9 % (ref 3–12)
NEUTROS ABS: 3.2 10*3/uL (ref 1.7–7.7)
Neutrophils Relative %: 53 % (ref 43–77)
PLATELETS: 298 10*3/uL (ref 150–400)
RBC: 4.89 MIL/uL (ref 3.87–5.11)
RDW: 14.3 % (ref 11.5–15.5)
WBC: 5.9 10*3/uL (ref 4.0–10.5)

## 2014-08-03 LAB — LIPASE, BLOOD: LIPASE: 35 U/L (ref 11–59)

## 2014-08-03 NOTE — ED Provider Notes (Signed)
CSN: [id number]     Arrival date & time 08/03/14  1403 History   First MD Initiated Contact with Patient 08/03/14 1744     Chief Complaint  Patient presents with  . Fall  . Back Pain  . Emesis   HPI Patient fell down a few stairs a few days ago. Since that time she's had some pain in her lower back as well as her left shoulder. The patient does have history of chronic headaches as well as chronic back pain. She has had multiple visits to the emergency room for evaluation in the past. Patient states her pain in her back and shoulder increases with movement. Nothing has made it any better. The patient also has noted some pain in her abdomen. She had a few episodes of nausea and vomiting. No change in appetite. No diarrhea. No dysuria Past Medical History  Diagnosis Date  . Chest pain     a. s/p reported MI in 1997;  b. 09/2012 Cath: nl cors, nl LV ([place]);  c. 05/2012 Non-ischemic myoview.  . Hypertension   . Arthritis   . Problem with literacy   . Shortness of breath   . Anxiety   . Bronchitis   . Chronic headaches   . Obesity   . COPD (chronic obstructive pulmonary disease)   . Chronic diastolic CHF (congestive heart failure)     a. 12/2011 Echo: nl ef, Gr 1 DD;  b. 11/2012 Echo: EF 45-50%, no significant valvular abnormalities.  Marland Kitchen GERD (gastroesophageal reflux disease)   . Myocardial infarction 1997    self reported  . Hypothyroidism   . Asthma   . Chronic chest pain    Past Surgical History  Procedure Laterality Date  . Ectopic pregnancy surgery      right  . Nm myoview ltd  June 2013    no reversible ischemia  . Cardiac catheterization  October 2012    No coronary artery disease  . Cardiac catheterization  2012    normal coronary arteries  . Nm myoview ltd  2012, 2013    normal  . 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 Knee arthroscopy  08/13/13    right   Family History  Problem Relation Age of Onset  . Cancer Mother 53    lung  ca  . Diabetes Mother   . Anxiety disorder Sister   . Cancer Brother 60    lung  . Colon cancer Neg Hx   . Pancreatic cancer Neg Hx   . Rectal cancer Neg Hx   . Stomach cancer Neg Hx    History  Substance Use Topics  . Smoking status: Current Every Day Smoker -- 0.20 packs/day  . Smokeless tobacco: Never Used  . Alcohol Use: No   OB History   Grav Para Term Preterm Abortions TAB SAB Ect Mult Living                 Review of Systems  All other systems reviewed and are negative.     Allergies  Darvocet; Ibuprofen; Tramadol; Tylenol; Celebrex; Naproxen; and Penicillins  Home Medications   Prior to Admission medications   Medication Sig Start Date End Date Taking? Authorizing Provider  aspirin EC 81 MG tablet Take 81 mg by mouth daily.   Yes Historical Provider, MD  atorvastatin (LIPITOR) 40 MG tablet Take 1 tablet (40 mg total) by mouth daily. 07/01/14  Yes Kehinde  Gwendlyn Deutscher, MD  beclomethasone (QVAR) 40 MCG/ACT inhaler Inhale 2 puffs into the lungs 2 (two) times daily.   Yes Historical Provider, MD  levothyroxine (SYNTHROID, LEVOTHROID) 75 MCG tablet Take 1 tablet (75 mcg total) by mouth daily before breakfast. 07/01/14  Yes Andrena Mews, MD  metoprolol succinate (TOPROL-XL) 50 MG 24 hr tablet Take 1 tablet (50 mg total) by mouth daily. Take with or immediately following a meal. 07/01/14  Yes Andrena Mews, MD  PARoxetine (PAXIL) 20 MG tablet Take 1 tablet (20 mg total) by mouth daily. 07/01/14  Yes Andrena Mews, MD  torsemide (DEMADEX) 20 MG tablet Take 20 mg by mouth daily.    Yes Historical Provider, MD   BP 120/61  Pulse 77  Temp(Src) 98.2 F (36.8 C) (Oral)  Resp 14  Ht 5\' 8"  (1.727 m)  Wt 214 lb (97.07 kg)  BMI 32.55 kg/m2  SpO2 99% Physical Exam  Nursing note and vitals reviewed. Constitutional: She appears well-developed and well-nourished. No distress.  HENT:  Head: Normocephalic and atraumatic.  Right Ear: External ear normal.  Left Ear: External ear  normal.  Eyes: Conjunctivae are normal. Right eye exhibits no discharge. Left eye exhibits no discharge. No scleral icterus.  Neck: Neck supple. No tracheal deviation present.  Cardiovascular: Normal rate, regular rhythm and intact distal pulses.   Pulmonary/Chest: Effort normal and breath sounds normal. No stridor. No respiratory distress. She has no wheezes. She has no rales.  Abdominal: Soft. Bowel sounds are normal. She exhibits no distension. There is no tenderness. There is no rebound and no guarding.  Musculoskeletal: She exhibits no edema.       Left shoulder: She exhibits tenderness. She exhibits normal range of motion, no bony tenderness, no swelling, no effusion and no deformity.       Cervical back: Normal.       Thoracic back: Normal.       Lumbar back: She exhibits tenderness and bony tenderness. She exhibits no swelling, no edema and no deformity.  Neurological: She is alert. She has normal strength. No cranial nerve deficit (no facial droop, extraocular movements intact, no slurred speech) or sensory deficit. She exhibits normal muscle tone. She displays no seizure activity. Coordination normal.  Skin: Skin is warm and dry. No rash noted.  Psychiatric: She has a normal mood and affect.    ED Course  Procedures (including critical care time) Labs Review Labs Reviewed  CBC WITH DIFFERENTIAL  COMPREHENSIVE METABOLIC PANEL  LIPASE, BLOOD  URINALYSIS, ROUTINE W REFLEX MICROSCOPIC    Imaging Review Dg Lumbar Spine Complete  08/03/2014   CLINICAL DATA:  [place].  Back pain.  EXAM: LUMBAR SPINE - COMPLETE 4+ VIEW  COMPARISON:  None.  FINDINGS: Normal alignment of the lumbar vertebral bodies. Disc spaces and vertebral bodies are maintained. The facets are normally aligned. No pars defects. The visualized bony pelvis is intact.  IMPRESSION: Normal alignment and no acute bony findings.   Electronically Signed   By: Kalman Jewels M.D.   On: 08/03/2014 18:50   Dg Shoulder  Left  08/03/2014   CLINICAL DATA:  [place].  Left shoulder pain.  EXAM: LEFT SHOULDER - 2+ VIEW  COMPARISON:  04/17/2014.  FINDINGS: The joint spaces are maintained. Mild degenerative changes. No acute fracture. The left ribs are intact and the visualized left lung is clear.  IMPRESSION: No fracture or dislocation.   Electronically Signed   By: Kalman Jewels M.D.   On: 08/03/2014 18:51  EKG Interpretation None      MDM   Final diagnoses:  Lumbar strain, initial encounter  Sprain of left shoulder, initial encounter    The patient's laboratory tests unremarkable. She was unable to give a urine sample in the emergency department. She has no urinary symptoms. I feel that she can safely follow up with her primary doctor for further evaluation if her symptoms persist   Guarding or injuries, there is no evidence of fracture or dislocation. I recommended over-the-counter medications for pain.   Dorie Rank, MD 08/03/14 [phone]

## 2014-08-03 NOTE — ED Notes (Signed)
Pt c/o mid back pain and left arm pain since falling down some stairs a couple of days ago; pt sts N/V x 2 days

## 2014-08-03 NOTE — Discharge Instructions (Signed)
Back Pain, Adult °Back pain is very common. The pain often gets better over time. The cause of back pain is usually not dangerous. Most people can learn to manage their back pain on their own.  °HOME CARE  °· Stay active. Start with short walks on flat ground if you can. Try to walk farther each day. °· Do not sit, drive, or stand in one place for more than 30 minutes. Do not stay in bed. °· Do not avoid exercise or work. Activity can help your back heal faster. °· Be careful when you bend or lift an object. Bend at your knees, keep the object close to you, and do not twist. °· Sleep on a firm mattress. Lie on your side, and bend your knees. If you lie on your back, put a pillow under your knees. °· Only take medicines as told by your doctor. °· Put ice on the injured area. °¨ Put ice in a plastic bag. °¨ Place a towel between your skin and the bag. °¨ Leave the ice on for 15-20 minutes, 03-04 times a day for the first 2 to 3 days. After that, you can switch between ice and heat packs. °· Ask your doctor about back exercises or massage. °· Avoid feeling anxious or stressed. Find good ways to deal with stress, such as exercise. °GET HELP RIGHT AWAY IF:  °· Your pain does not go away with rest or medicine. °· Your pain does not go away in 1 week. °· You have new problems. °· You do not feel well. °· The pain spreads into your legs. °· You cannot control when you poop (bowel movement) or pee (urinate). °· Your arms or legs feel weak or lose feeling (numbness). °· You feel sick to your stomach (nauseous) or throw up (vomit). °· You have belly (abdominal) pain. °· You feel like you may pass out (faint). °MAKE SURE YOU:  °· Understand these instructions. °· Will watch your condition. °· Will get help right away if you are not doing well or get worse. °Document Released: 05/20/2008 Document Revised: 02/24/2012 Document Reviewed: 04/05/2014 °[employer]® Patient Information ©2015 [employer]. This information is not intended  to replace advice given to you by your health care provider. Make sure you discuss any questions you have with your health care provider. ° °

## 2014-08-03 NOTE — ED Notes (Signed)
Pt states slipped while walking dog and fell on wood and since has been having left arm pain, lower back pain.  Pt reports chronic headache

## 2014-08-10 ENCOUNTER — Emergency Department (HOSPITAL_COMMUNITY)
Admission: EM | Admit: 2014-08-10 | Discharge: 2014-08-10 | Disposition: A | Discharge status: Home / Self Care | Payer: Medicaid Other | Attending: Emergency Medicine | Admitting: Emergency Medicine

## 2014-08-10 DIAGNOSIS — J40 Bronchitis, not specified as acute or chronic: Secondary | ICD-10-CM | POA: Diagnosis not present

## 2014-08-10 DIAGNOSIS — K5900 Constipation, unspecified: Secondary | ICD-10-CM | POA: Insufficient documentation

## 2014-08-10 DIAGNOSIS — Z880 Allergy status to penicillin: Secondary | ICD-10-CM | POA: Insufficient documentation

## 2014-08-10 DIAGNOSIS — I10 Essential (primary) hypertension: Secondary | ICD-10-CM | POA: Insufficient documentation

## 2014-08-10 DIAGNOSIS — G8929 Other chronic pain: Secondary | ICD-10-CM | POA: Insufficient documentation

## 2014-08-10 DIAGNOSIS — J45901 Unspecified asthma with (acute) exacerbation: Secondary | ICD-10-CM | POA: Insufficient documentation

## 2014-08-10 DIAGNOSIS — I5032 Chronic diastolic (congestive) heart failure: Secondary | ICD-10-CM | POA: Diagnosis not present

## 2014-08-10 DIAGNOSIS — IMO0002 Reserved for concepts with insufficient information to code with codable children: Secondary | ICD-10-CM | POA: Diagnosis not present

## 2014-08-10 DIAGNOSIS — R0789 Other chest pain: Secondary | ICD-10-CM | POA: Diagnosis not present

## 2014-08-10 DIAGNOSIS — R079 Chest pain, unspecified: Secondary | ICD-10-CM | POA: Insufficient documentation

## 2014-08-10 DIAGNOSIS — J441 Chronic obstructive pulmonary disease with (acute) exacerbation: Secondary | ICD-10-CM | POA: Diagnosis not present

## 2014-08-10 DIAGNOSIS — M129 Arthropathy, unspecified: Secondary | ICD-10-CM | POA: Diagnosis not present

## 2014-08-10 DIAGNOSIS — Z79899 Other long term (current) drug therapy: Secondary | ICD-10-CM | POA: Insufficient documentation

## 2014-08-10 DIAGNOSIS — F17200 Nicotine dependence, unspecified, uncomplicated: Secondary | ICD-10-CM | POA: Diagnosis not present

## 2014-08-10 DIAGNOSIS — I252 Old myocardial infarction: Secondary | ICD-10-CM | POA: Insufficient documentation

## 2014-08-10 DIAGNOSIS — E669 Obesity, unspecified: Secondary | ICD-10-CM | POA: Insufficient documentation

## 2014-08-10 DIAGNOSIS — Z7982 Long term (current) use of aspirin: Secondary | ICD-10-CM | POA: Diagnosis not present

## 2014-08-10 DIAGNOSIS — F411 Generalized anxiety disorder: Secondary | ICD-10-CM | POA: Diagnosis not present

## 2014-08-10 DIAGNOSIS — E039 Hypothyroidism, unspecified: Secondary | ICD-10-CM | POA: Insufficient documentation

## 2014-08-10 DIAGNOSIS — Z9861 Coronary angioplasty status: Secondary | ICD-10-CM | POA: Diagnosis not present

## 2014-08-10 MED ORDER — SENNA 8.6 MG PO TABS: 1.0000 | ORAL_TABLET | Freq: Every day | ORAL | Status: DC

## 2014-08-10 NOTE — ED Provider Notes (Signed)
CSN: [id number]     Arrival date & time 08/10/14  2304 History   First MD Initiated Contact with Patient 08/10/14 2307     Chief Complaint  Patient presents with  . Chest Pain     (Consider location/radiation/quality/duration/timing/severity/associated sxs/prior Treatment) HPI Comments: 58 year old female, history of 24 this is to the emergency department in the last 6 months, chronic chest pain though had a normal catheterization in 2012 and a normal stress test in June of 2013. She states that earlier this morning she had chest pain, she had associated lower back pain and has had associated constipation as well as left knee pain which she states are subacute to chronic conditions. Her chest pain is completely resolved at this time and she denies fevers or chills.  Patient is a 58 y.o. female presenting with chest pain. The history is provided by the patient.  Chest Pain   Past Medical History  Diagnosis Date  . Chest pain     a. s/p reported MI in 1997;  b. 09/2012 Cath: nl cors, nl LV ([place]);  c. 05/2012 Non-ischemic myoview.  . Hypertension   . Arthritis   . Problem with literacy   . Shortness of breath   . Anxiety   . Bronchitis   . Chronic headaches   . Obesity   . COPD (chronic obstructive pulmonary disease)   . Chronic diastolic CHF (congestive heart failure)     a. 12/2011 Echo: nl ef, Gr 1 DD;  b. 11/2012 Echo: EF 45-50%, no significant valvular abnormalities.  Marland Kitchen GERD (gastroesophageal reflux disease)   . Myocardial infarction 1997    self reported  . Hypothyroidism   . Asthma   . Chronic chest pain    Past Surgical History  Procedure Laterality Date  . Ectopic pregnancy surgery      right  . Nm myoview ltd  June 2013    no reversible ischemia  . Cardiac catheterization  October 2012    No coronary artery disease  . Cardiac catheterization  2012    normal coronary arteries  . Nm myoview ltd  2012, 2013    normal  . 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 Knee arthroscopy  08/13/13    right   Family History  Problem Relation Age of Onset  . Cancer Mother 68    lung ca  . Diabetes Mother   . Anxiety disorder Sister   . Cancer Brother 60    lung  . Colon cancer Neg Hx   . Pancreatic cancer Neg Hx   . Rectal cancer Neg Hx   . Stomach cancer Neg Hx    History  Substance Use Topics  . Smoking status: Current Every Day Smoker -- 0.20 packs/day  . Smokeless tobacco: Never Used  . Alcohol Use: No   OB History   Grav Para Term Preterm Abortions TAB SAB Ect Mult Living                 Review of Systems  Cardiovascular: Positive for chest pain.  All other systems reviewed and are negative.     Allergies  Darvocet; Ibuprofen; Tramadol; Tylenol; Celebrex; Naproxen; and Penicillins  Home Medications   Prior to Admission medications   Medication Sig Start Date End Date Taking? Authorizing Provider  aspirin EC 81 MG tablet Take 81 mg by mouth daily.   Yes Historical Provider, MD  atorvastatin (LIPITOR) 40 MG tablet  Take 1 tablet (40 mg total) by mouth daily. 07/01/14  Yes Andrena Mews, MD  beclomethasone (QVAR) 40 MCG/ACT inhaler Inhale 2 puffs into the lungs 2 (two) times daily.   Yes Historical Provider, MD  levothyroxine (SYNTHROID, LEVOTHROID) 75 MCG tablet Take 1 tablet (75 mcg total) by mouth daily before breakfast. 07/01/14  Yes Andrena Mews, MD  metoprolol succinate (TOPROL-XL) 50 MG 24 hr tablet Take 1 tablet (50 mg total) by mouth daily. Take with or immediately following a meal. 07/01/14  Yes Andrena Mews, MD  PARoxetine (PAXIL) 20 MG tablet Take 1 tablet (20 mg total) by mouth daily. 07/01/14  Yes Andrena Mews, MD  torsemide (DEMADEX) 20 MG tablet Take 20 mg by mouth daily.    Yes Historical Provider, MD  senna (SENOKOT) 8.6 MG TABS tablet Take 1 tablet (8.6 mg total) by mouth daily. Until having soft daily stools 08/10/14   Johnna Acosta, MD   BP 118/60  Pulse 72  Temp(Src) 98 F  (36.7 C) (Oral)  Resp 14  Ht 5\' 8"  (1.727 m)  Wt 214 lb (97.07 kg)  BMI 32.55 kg/m2  SpO2 98% Physical Exam  Nursing note and vitals reviewed. Constitutional: She appears well-developed and well-nourished. No distress.  HENT:  Head: Normocephalic and atraumatic.  Mouth/Throat: Oropharynx is clear and moist. No oropharyngeal exudate.  Eyes: Conjunctivae and EOM are normal. Pupils are equal, round, and reactive to light. Right eye exhibits no discharge. Left eye exhibits no discharge. No scleral icterus.  Neck: Normal range of motion. Neck supple. No JVD present. No thyromegaly present.  Cardiovascular: Normal rate, regular rhythm, normal heart sounds and intact distal pulses.  Exam reveals no gallop and no friction rub.   No murmur heard. Pulmonary/Chest: Effort normal and breath sounds normal. No respiratory distress. She has no wheezes. She has no rales.  Abdominal: Soft. Bowel sounds are normal. She exhibits no distension and no mass. There is no tenderness.  Musculoskeletal: Normal range of motion. She exhibits no edema and no tenderness.  Lymphadenopathy:    She has no cervical adenopathy.  Neurological: She is alert. Coordination normal.  Skin: Skin is warm and dry. No rash noted. No erythema.  Psychiatric: She has a normal mood and affect. Her behavior is normal.    ED Course  Procedures (including critical care time) Labs Review Labs Reviewed - No data to display  Imaging Review No results found.   EKG Interpretation   Date/Time:  Wednesday August 10 2014 23:13:58 EDT Ventricular Rate:  70 PR Interval:  155 QRS Duration: 90 QT Interval:  427 QTC Calculation: 461 R Axis:   32 Text Interpretation:  Sinus rhythm Borderline low voltage, extremity leads  ECG OTHERWISE WITHIN NORMAL LIMITS since last tracing no significant  change Confirmed by Amellia Panik  MD, Francies Inch ([id number]) on 08/10/2014 11:35:55 PM       MDM   Final diagnoses:  Other chest pain  Constipation,  unspecified constipation type    The patient is a very soft nondistended abdomen, no guarding, no masses. Her heart and lung exams are totally normal with no murmurs rubs or gallops, no wheezing rhonchi or rales and totally normal vital signs with a blood pressure 118/60, no fever, no tachycardia and no hypoxia. She is not wheezing, she states that she is out of her medications and has not been taking her beta blocker or her bronchodilators for several days. That being said she is not wheezing or short of breath and has no  hypertension. At this time it would not be prudent to treat her with these medications as it might cause an unsafe drop in blood pressure or bradycardia. She can wait until the first of the month when she can get her medications refilled for this purpose. EKG pending though doubt this is related to a cardiac source.  ECG unremarkable, stable for d/c.  Johnna Acosta, MD 08/10/14 [phone]

## 2014-08-10 NOTE — ED Notes (Signed)
Per EMS, the patient complains of chest pain that started at 0800 today, intermittent. Came back this evening rating at 9/10, tender on palpation. Non compliance with meds for financial reasons. 20g in left hand. 324 of ASA given, and 2 nitro.  Chest pain free since. Lower lumbar pain/ lower back pain.  Abdominal pain, constipation, and chronic left knee pain. BP 112/70, P 72, RR 16. Clear bilateral lungs sounds. 12 lead unremarkable.

## 2014-08-10 NOTE — ED Notes (Signed)
Dr. Miller at the bedside.  

## 2014-08-10 NOTE — Discharge Instructions (Signed)
Please call your doctor for a followup appointment within 24-48 hours. When you talk to your doctor please let them know that you were seen in the emergency department and have them acquire all of your records so that they can discuss the findings with you and formulate a treatment plan to fully care for your new and ongoing problems. ° °

## 2014-08-15 ENCOUNTER — Emergency Department (HOSPITAL_COMMUNITY)
Admission: EM | Admit: 2014-08-15 | Discharge: 2014-08-15 | Disposition: A | Discharge status: Home / Self Care | Payer: Medicaid Other | Attending: Emergency Medicine | Admitting: Emergency Medicine

## 2014-08-15 ENCOUNTER — Emergency Department (HOSPITAL_COMMUNITY): Payer: Medicaid Other

## 2014-08-15 ENCOUNTER — Encounter (HOSPITAL_COMMUNITY): Payer: Self-pay | Admitting: Emergency Medicine

## 2014-08-15 DIAGNOSIS — Z8719 Personal history of other diseases of the digestive system: Secondary | ICD-10-CM | POA: Diagnosis not present

## 2014-08-15 DIAGNOSIS — E039 Hypothyroidism, unspecified: Secondary | ICD-10-CM | POA: Diagnosis not present

## 2014-08-15 DIAGNOSIS — R110 Nausea: Secondary | ICD-10-CM | POA: Diagnosis not present

## 2014-08-15 DIAGNOSIS — Y929 Unspecified place or not applicable: Secondary | ICD-10-CM | POA: Diagnosis not present

## 2014-08-15 DIAGNOSIS — IMO0002 Reserved for concepts with insufficient information to code with codable children: Secondary | ICD-10-CM | POA: Insufficient documentation

## 2014-08-15 DIAGNOSIS — I10 Essential (primary) hypertension: Secondary | ICD-10-CM | POA: Diagnosis not present

## 2014-08-15 DIAGNOSIS — W010XXA Fall on same level from slipping, tripping and stumbling without subsequent striking against object, initial encounter: Secondary | ICD-10-CM | POA: Insufficient documentation

## 2014-08-15 DIAGNOSIS — F17200 Nicotine dependence, unspecified, uncomplicated: Secondary | ICD-10-CM | POA: Insufficient documentation

## 2014-08-15 DIAGNOSIS — M5430 Sciatica, unspecified side: Secondary | ICD-10-CM | POA: Insufficient documentation

## 2014-08-15 DIAGNOSIS — G8929 Other chronic pain: Secondary | ICD-10-CM | POA: Insufficient documentation

## 2014-08-15 DIAGNOSIS — Z79899 Other long term (current) drug therapy: Secondary | ICD-10-CM | POA: Insufficient documentation

## 2014-08-15 DIAGNOSIS — M129 Arthropathy, unspecified: Secondary | ICD-10-CM | POA: Insufficient documentation

## 2014-08-15 DIAGNOSIS — Y93K1 Activity, walking an animal: Secondary | ICD-10-CM | POA: Diagnosis not present

## 2014-08-15 DIAGNOSIS — Z7982 Long term (current) use of aspirin: Secondary | ICD-10-CM | POA: Diagnosis not present

## 2014-08-15 DIAGNOSIS — I5032 Chronic diastolic (congestive) heart failure: Secondary | ICD-10-CM | POA: Diagnosis not present

## 2014-08-15 DIAGNOSIS — J449 Chronic obstructive pulmonary disease, unspecified: Secondary | ICD-10-CM | POA: Diagnosis not present

## 2014-08-15 DIAGNOSIS — Z880 Allergy status to penicillin: Secondary | ICD-10-CM | POA: Insufficient documentation

## 2014-08-15 DIAGNOSIS — F411 Generalized anxiety disorder: Secondary | ICD-10-CM | POA: Diagnosis not present

## 2014-08-15 DIAGNOSIS — J4489 Other specified chronic obstructive pulmonary disease: Secondary | ICD-10-CM | POA: Insufficient documentation

## 2014-08-15 DIAGNOSIS — E669 Obesity, unspecified: Secondary | ICD-10-CM | POA: Diagnosis not present

## 2014-08-15 DIAGNOSIS — M545 Low back pain: Secondary | ICD-10-CM

## 2014-08-15 DIAGNOSIS — I252 Old myocardial infarction: Secondary | ICD-10-CM | POA: Insufficient documentation

## 2014-08-15 MED ORDER — OXYCODONE-ACETAMINOPHEN 5-325 MG PO TABS
2.0000 | ORAL_TABLET | Freq: Once | ORAL | Status: AC
  Administered 2014-08-15: 2 via ORAL
  Filled 2014-08-15: qty 2

## 2014-08-15 MED ORDER — ONDANSETRON 4 MG PO TBDP
8.0000 mg | ORAL_TABLET | Freq: Once | ORAL | Status: AC
  Administered 2014-08-15: 8 mg via ORAL
  Filled 2014-08-15: qty 2

## 2014-08-15 MED ORDER — OXYCODONE-ACETAMINOPHEN 5-325 MG PO TABS: 1.0000 | ORAL_TABLET | ORAL | Status: DC | PRN

## 2014-08-15 MED ORDER — ONDANSETRON HCL 4 MG PO TABS: 4.0000 mg | ORAL_TABLET | Freq: Four times a day (QID) | ORAL | Status: DC

## 2014-08-15 NOTE — ED Provider Notes (Signed)
CSN: [id number]     Arrival date & time 08/15/14  1706 History  This chart was scribed for non-physician practitioner, Cleatrice Burke, PA-C working with Carmin Muskrat, MD by Frederich Balding, ED scribe. This patient was seen in room TR09C/TR09C and the patient's care was started at 5:54 PM.   Chief Complaint  Patient presents with  . Back Pain   The history is provided by the patient. No language interpreter was used.   HPI Comments: Emmalynne Courtney is a 58 y.o. female who presents to the Emergency Department complaining of a fall that occurred a few weeks ago. States she slipped down the steps and hit her back when she landed. Denies hitting her head or LOC. Reports sharp lower back pain that does not radiate. States she has nausea when the pain gets very severe. Certain movements worsen the pain. She has taken tylenol with no relief. Denies fever, chills, emesis, bowel or bladder incontinence. Denies history of cancer or IV drug use.,   Past Medical History  Diagnosis Date  . Chest pain     a. s/p reported MI in 1997;  b. 09/2012 Cath: nl cors, nl LV ([place]);  c. 05/2012 Non-ischemic myoview.  . Hypertension   . Arthritis   . Problem with literacy   . Shortness of breath   . Anxiety   . Bronchitis   . Chronic headaches   . Obesity   . COPD (chronic obstructive pulmonary disease)   . Chronic diastolic CHF (congestive heart failure)     a. 12/2011 Echo: nl ef, Gr 1 DD;  b. 11/2012 Echo: EF 45-50%, no significant valvular abnormalities.  Marland Kitchen GERD (gastroesophageal reflux disease)   . Myocardial infarction 1997    self reported  . Hypothyroidism   . Asthma   . Chronic chest pain    Past Surgical History  Procedure Laterality Date  . Ectopic pregnancy surgery      right  . Nm myoview ltd  June 2013    no reversible ischemia  . Cardiac catheterization  October 2012    No coronary artery disease  . Cardiac catheterization  2012    normal coronary arteries  . Nm myoview ltd   2012, 2013    normal  . 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 Knee arthroscopy  08/13/13    right   Family History  Problem Relation Age of Onset  . Cancer Mother 34    lung ca  . Diabetes Mother   . Anxiety disorder Sister   . Cancer Brother 60    lung  . Colon cancer Neg Hx   . Pancreatic cancer Neg Hx   . Rectal cancer Neg Hx   . Stomach cancer Neg Hx    History  Substance Use Topics  . Smoking status: Current Every Day Smoker -- 0.20 packs/day  . Smokeless tobacco: Never Used  . Alcohol Use: No   OB History   Grav Para Term Preterm Abortions TAB SAB Ect Mult Living                 Review of Systems  Constitutional: Negative for fever and chills.  Gastrointestinal: Positive for nausea. Negative for vomiting.  Genitourinary:       Negative for bowel or bladder incontinence.   Musculoskeletal: Positive for back pain and myalgias.  All other systems reviewed and are negative.  Allergies  Darvocet; Ibuprofen; Tramadol; Tylenol;  Celebrex; Naproxen; and Penicillins  Home Medications   Prior to Admission medications   Medication Sig Start Date End Date Taking? Authorizing Provider  aspirin EC 81 MG tablet Take 81 mg by mouth daily.    Historical Provider, MD  atorvastatin (LIPITOR) 40 MG tablet Take 1 tablet (40 mg total) by mouth daily. 07/01/14   Andrena Mews, MD  beclomethasone (QVAR) 40 MCG/ACT inhaler Inhale 2 puffs into the lungs 2 (two) times daily.    Historical Provider, MD  levothyroxine (SYNTHROID, LEVOTHROID) 75 MCG tablet Take 1 tablet (75 mcg total) by mouth daily before breakfast. 07/01/14   Andrena Mews, MD  metoprolol succinate (TOPROL-XL) 50 MG 24 hr tablet Take 1 tablet (50 mg total) by mouth daily. Take with or immediately following a meal. 07/01/14   Andrena Mews, MD  PARoxetine (PAXIL) 20 MG tablet Take 1 tablet (20 mg total) by mouth daily. 07/01/14   Andrena Mews, MD  senna (SENOKOT) 8.6 MG TABS tablet Take  1 tablet (8.6 mg total) by mouth daily. Until having soft daily stools 08/10/14   Johnna Acosta, MD  torsemide (DEMADEX) 20 MG tablet Take 20 mg by mouth daily.     Historical Provider, MD   BP 123/82  Pulse 85  Temp(Src) 98.1 F (36.7 C) (Oral)  Resp 16  Ht 5\' 8"  (1.727 m)  Wt 214 lb (97.07 kg)  BMI 32.55 kg/m2  SpO2 97%  Physical Exam  Nursing note and vitals reviewed. Constitutional: She is oriented to person, place, and time. She appears well-developed and well-nourished. No distress.  No acute distress.  HENT:  Head: Normocephalic and atraumatic.  Right Ear: External ear normal.  Left Ear: External ear normal.  Nose: Nose normal.  Mouth/Throat: Oropharynx is clear and moist.  Eyes: Conjunctivae are normal.  Neck: Normal range of motion.  Cardiovascular: Normal rate, regular rhythm and normal heart sounds.   Pulses:      Posterior tibial pulses are 2+ on the right side, and 2+ on the left side.  Pulmonary/Chest: Effort normal and breath sounds normal. No stridor. No respiratory distress. She has no wheezes. She has no rales.  Abdominal: Soft. She exhibits no distension.  Musculoskeletal: Normal range of motion.  Tender to palpation to lumbar spine. No deformities or step offs. Strength 5/5 in lower extremities bilaterally.   Neurological: She is alert and oriented to person, place, and time. She has normal strength.  Antalgic gait  Skin: Skin is warm and dry. She is not diaphoretic. No erythema.  Psychiatric: She has a normal mood and affect. Her behavior is normal.    ED Course  Procedures (including critical care time)  DIAGNOSTIC STUDIES: Oxygen Saturation is 96% on RA, normal by my interpretation.    COORDINATION OF CARE: 5:55 PM-Discussed treatment plan which includes xray and pain medication with pt at bedside and pt agreed to plan.   Labs Review Labs Reviewed - No data to display  Imaging Review Dg Lumbar Spine Complete  08/15/2014   CLINICAL DATA:  [place]  down stairs 3 weeks ago.  Low back pain.  EXAM: LUMBAR SPINE - COMPLETE 4+ VIEW  COMPARISON:  CT, 08/04/2014.  FINDINGS: No fracture. No spondylolisthesis. Minor loss of disc height at L3-L4 with mild loss disc height at L4-L5. Remaining disc spaces are relatively well preserved.  Mild straightening of the cervical lordosis along the mid to lower lumbar spine.  Soft tissues are unremarkable.  IMPRESSION: No fracture or acute finding. Stable appearance from  the prior CT scan.   Electronically Signed   By: Lajean Manes M.D.   On: 08/15/2014 19:14     EKG Interpretation None      MDM   Final diagnoses:  Midline low back pain, with sciatica presence unspecified   Patient with back pain.  No neurological deficits and normal neuro exam.  Patient can walk but states is painful.  No loss of bowel or bladder control.  No concern for cauda equina.  No fever, night sweats, weight loss, h/o cancer, IVDU.  RICE protocol and pain medicine indicated and discussed with patient.    I personally performed the services described in this documentation, which was scribed in my presence. The recorded information has been reviewed and is accurate.  Elwyn Lade, PA-C 08/15/14 1925

## 2014-08-15 NOTE — ED Notes (Signed)
Patient discharged with all personal belongings. 

## 2014-08-15 NOTE — Discharge Instructions (Signed)
Back Pain, Adult °Back pain is very common. The pain often gets better over time. The cause of back pain is usually not dangerous. Most people can learn to manage their back pain on their own.  °HOME CARE  °· Stay active. Start with short walks on flat ground if you can. Try to walk farther each day. °· Do not sit, drive, or stand in one place for more than 30 minutes. Do not stay in bed. °· Do not avoid exercise or work. Activity can help your back heal faster. °· Be careful when you bend or lift an object. Bend at your knees, keep the object close to you, and do not twist. °· Sleep on a firm mattress. Lie on your side, and bend your knees. If you lie on your back, put a pillow under your knees. °· Only take medicines as told by your doctor. °· Put ice on the injured area. °¨ Put ice in a plastic bag. °¨ Place a towel between your skin and the bag. °¨ Leave the ice on for 15-20 minutes, 03-04 times a day for the first 2 to 3 days. After that, you can switch between ice and heat packs. °· Ask your doctor about back exercises or massage. °· Avoid feeling anxious or stressed. Find good ways to deal with stress, such as exercise. °GET HELP RIGHT AWAY IF:  °· Your pain does not go away with rest or medicine. °· Your pain does not go away in 1 week. °· You have new problems. °· You do not feel well. °· The pain spreads into your legs. °· You cannot control when you poop (bowel movement) or pee (urinate). °· Your arms or legs feel weak or lose feeling (numbness). °· You feel sick to your stomach (nauseous) or throw up (vomit). °· You have belly (abdominal) pain. °· You feel like you may pass out (faint). °MAKE SURE YOU:  °· Understand these instructions. °· Will watch your condition. °· Will get help right away if you are not doing well or get worse. °Document Released: 05/20/2008 Document Revised: 02/24/2012 Document Reviewed: 04/05/2014 °[employer]® Patient Information ©2015 [employer]. This information is not intended  to replace advice given to you by your health care provider. Make sure you discuss any questions you have with your health care provider. ° °

## 2014-08-15 NOTE — ED Provider Notes (Signed)
  Medical screening examination/treatment/procedure(s) were performed by non-physician practitioner and as supervising physician I was immediately available for consultation/collaboration.   EKG Interpretation None         Carmin Muskrat, MD 08/15/14 [phone]

## 2014-08-15 NOTE — ED Notes (Signed)
Pt reports recent fall when walking the dog and now has mid back pain that radiates around to left rib area. No acute distress noted at triage.

## 2014-08-19 ENCOUNTER — Ambulatory Visit: Payer: Medicaid Other | Admitting: Family Medicine

## 2014-09-12 ENCOUNTER — Ambulatory Visit: Payer: Medicaid Other | Admitting: Internal Medicine

## 2014-09-13 ENCOUNTER — Encounter: Payer: Self-pay | Admitting: Internal Medicine

## 2014-09-26 ENCOUNTER — Ambulatory Visit: Payer: Medicaid Other | Admitting: Internal Medicine

## 2014-09-26 ENCOUNTER — Telehealth: Payer: Self-pay | Admitting: Family Medicine

## 2014-09-26 NOTE — Telephone Encounter (Signed)
LM for patient to call back.  Please ask her what kind of symptoms she is having.  This will prepare me for what we are going to discuss.  Thanks [employer]

## 2014-09-26 NOTE — Telephone Encounter (Signed)
Pt called back and said that her symptoms are: hurts when she urinating, also feels like something is falling out of her uterus, back pain, and when she coughs her ribs hurt. jw

## 2014-09-26 NOTE — Telephone Encounter (Signed)
Pt called and would like to talk to the nurse about some of the symptoms she is having. jw

## 2014-09-26 NOTE — Telephone Encounter (Signed)
appt made for 09/29/14 @ 2:30p. Tametra Ahart,CMA

## 2014-09-29 ENCOUNTER — Encounter: Payer: Self-pay | Admitting: Family Medicine

## 2014-09-29 ENCOUNTER — Ambulatory Visit (INDEPENDENT_AMBULATORY_CARE_PROVIDER_SITE_OTHER): Payer: Medicaid Other | Admitting: Family Medicine

## 2014-09-29 VITALS — BP 118/82 | HR 60 | Temp 98.4°F | Ht 68.0 in | Wt 214.0 lb

## 2014-09-29 DIAGNOSIS — N390 Urinary tract infection, site not specified: Secondary | ICD-10-CM | POA: Insufficient documentation

## 2014-09-29 DIAGNOSIS — M5440 Lumbago with sciatica, unspecified side: Secondary | ICD-10-CM

## 2014-09-29 DIAGNOSIS — N3000 Acute cystitis without hematuria: Secondary | ICD-10-CM

## 2014-09-29 DIAGNOSIS — R309 Painful micturition, unspecified: Secondary | ICD-10-CM

## 2014-09-29 LAB — POCT URINALYSIS DIPSTICK
Bilirubin, UA: NEGATIVE
Blood, UA: NEGATIVE
Glucose, UA: NEGATIVE
KETONES UA: NEGATIVE
Nitrite, UA: POSITIVE
Protein, UA: NEGATIVE
Urobilinogen, UA: 0.2
pH, UA: 6

## 2014-09-29 LAB — POCT UA - MICROSCOPIC ONLY

## 2014-09-29 MED ORDER — CYCLOBENZAPRINE HCL 10 MG PO TABS: 10.0000 mg | ORAL_TABLET | Freq: Three times a day (TID) | ORAL | Status: AC | PRN

## 2014-09-29 MED ORDER — CEPHALEXIN 500 MG PO CAPS: 500.0000 mg | ORAL_CAPSULE | Freq: Three times a day (TID) | ORAL | Status: DC

## 2014-09-29 NOTE — Progress Notes (Signed)
   Subjective:    Patient ID: [name], female    DOB: December 09, 1956, 58 y.o.   MRN: [id number]  Patient presents for a same day appointment.  HPI  SUPRAPUBIC PAIN WITH URINATION: - Describes gradual onset of pain with urination over past few days to 1 week, increased urinary frequency q 5-10 min with nocturia - Prior hx UTI with similar symptoms - Admits to some nausea without vomiting - Denies any fevers/chills, hematuria, dysuria, or foul odors  LOW BACK PAIN: - Known chronic hx LBP, multiple regular ED visits for similar complaints - Reports bilateral lower back pain, described as "sharp pains" some radiation down right leg to knee, states increased pain with ambulation, prolonged sitting. Pain started a few days ago. Additionally complains of right side flank pain with cough. - States ran out of Percocet few days ago, now increasing pain, previously improved - Tried Tylenol without relief. Ibuprofen 1x daily with some relief. - Frequent recent ED visits for chronic low back pain, currently treated with Percocet 5/325 from PCP  I have reviewed and updated the following as appropriate: allergies and current medications  Social Hx: - Active smoker  Review of Systems  See above HPI    Objective:   Physical Exam  BP 118/82  Pulse 60  Temp(Src) 98.4 F (36.9 C) (Oral)  Ht 5\' 8"  (1.727 m)  Wt 214 lb (97.07 kg)  BMI 32.55 kg/m2  Gen - well-appearing, cooperative, NAD HEENT - oropharynx clear, MMM Neck - supple, non-tender, no LAD, no thyromegaly Heart - RRR, no murmurs heard Lungs - CTAB, no wheezing, crackles, or rhonchi. Normal work of breathing. Abd - soft, mild suprapubic abdominal tenderness, no guarding, no masses, +active BS MSK - b/l lower lumbar pain on palpation, non-tender over spinous processes, no significant CVAT, seated SLR slump test with reproduction of localized LBP (R>L) without radicular symptoms Ext - non-tender, no edema, peripheral pulses  intact +2 b/l, good cap refill < 3 sec Skin - warm, dry, no rashes Neuro - awake, alert, oriented, grossly non-focal, intact muscle strength 5/5 b/l grip / ankle, intact distal sensation to light touch, gait normal    Assessment & Plan:   See specific A&P problem list for details.

## 2014-09-29 NOTE — Patient Instructions (Addendum)
Dear [name], Thank you for coming in to clinic today.  1. It looks like you have Urinary Tract Infection - we will treat you with Keflex antibiotic take one 500mg  tablet 3 times daily (every 8 hours, with breakfast, lunch, and dinner) for next 10 days. Please complete entire treatment course. 2. We will send your Urine for a culture - if we need to change your antibiotic we will call you to tell you. 3. Take Flexeril (muscle relaxant) for back pain as needed. Otherwise, recommend increasing dose of Ibuprofen to 400 to 600mg  (2 to 3 of the 200mg  capsules) every 6 hours as needed (take with food and drink plenty of water) 4. Make sure that you continue to stay well hydrated, eat well.  If you develop any persistent nausea and vomiting, fevers/chills or worsening symptoms please call clinic back to be seen, we may need to change antibiotics or you may require IV antibiotics if significant worsening.  Please schedule a follow-up appointment with Dr. Gwendlyn Deutscher in 1-2 weeks as needed to follow-up for Back Pain / Urinary Infection if worsening.  If you have any other questions or concerns, please feel free to call the clinic to contact me. You may also schedule an earlier appointment if necessary.  However, if your symptoms get significantly worse, please go to the Emergency Department to seek immediate medical attention.  Nobie Putnam, DO [facility]   Urinary Tract Infection Urinary tract infections (UTIs) can develop anywhere along your urinary tract. Your urinary tract is your body's drainage system for removing wastes and extra water. Your urinary tract includes two kidneys, two ureters, a bladder, and a urethra. Your kidneys are a pair of bean-shaped organs. Each kidney is about the size of your fist. They are located below your ribs, one on each side of your spine. CAUSES Infections are caused by microbes, which are microscopic organisms, including fungi, viruses,  and bacteria. These organisms are so small that they can only be seen through a microscope. Bacteria are the microbes that most commonly cause UTIs. SYMPTOMS  Symptoms of UTIs may vary by age and gender of the patient and by the location of the infection. Symptoms in young women typically include a frequent and intense urge to urinate and a painful, burning feeling in the bladder or urethra during urination. Older women and men are more likely to be tired, shaky, and weak and have muscle aches and abdominal pain. A fever may mean the infection is in your kidneys. Other symptoms of a kidney infection include pain in your back or sides below the ribs, nausea, and vomiting. DIAGNOSIS To diagnose a UTI, your caregiver will ask you about your symptoms. Your caregiver also will ask to provide a urine sample. The urine sample will be tested for bacteria and white blood cells. White blood cells are made by your body to help fight infection. TREATMENT  Typically, UTIs can be treated with medication. Because most UTIs are caused by a bacterial infection, they usually can be treated with the use of antibiotics. The choice of antibiotic and length of treatment depend on your symptoms and the type of bacteria causing your infection. HOME CARE INSTRUCTIONS  If you were prescribed antibiotics, take them exactly as your caregiver instructs you. Finish the medication even if you feel better after you have only taken some of the medication.  Drink enough water and fluids to keep your urine clear or pale yellow.  Avoid caffeine, tea, and carbonated beverages.  They tend to irritate your bladder.  Empty your bladder often. Avoid holding urine for long periods of time.  Empty your bladder before and after sexual intercourse.  After a bowel movement, women should cleanse from front to back. Use each tissue only once. SEEK MEDICAL CARE IF:   You have back pain.  You develop a fever.  Your symptoms do not begin to  resolve within 3 days. SEEK IMMEDIATE MEDICAL CARE IF:   You have severe back pain or lower abdominal pain.  You develop chills.  You have nausea or vomiting.  You have continued burning or discomfort with urination. MAKE SURE YOU:   Understand these instructions.  Will watch your condition.  Will get help right away if you are not doing well or get worse. Document Released: 09/11/2005 Document Revised: 06/02/2012 Document Reviewed: 01/10/2012 [facility] Patient Information 2015 [place], [place]. This information is not intended to replace advice given to you by your health care provider. Make sure you discuss any questions you have with your health care provider.

## 2014-09-30 LAB — URINE CULTURE: Colony Count: >=100000

## 2014-09-30 NOTE — Assessment & Plan Note (Signed)
Recent worsening of chronic LBP in setting of running out of Percocet few days ago, no history of trauma or injury recently, without radicular symptoms or other red flags. Considered may be related to CVAT with possible pyelo, however seems less likely given history and lack of other systemic symptoms with cystitis - Multiple ED visits requesting pain medicines  Plan: 1. Refilled Flexeril (due to reported significant improvement) PRN, continue other meds and conservative therapy, advised Tylenol, NSAIDs, heat, stretching, active 2. Discussed decision to defer Percocet refill to PCP, advised patient to RTC if worsening to discuss pain management

## 2014-09-30 NOTE — Assessment & Plan Note (Signed)
Clinically suggestive of acute cystitis, possible but less likely pyelonephritis given LBP however does not seem consistent with CVAT and known chronic LBP, afebrile, no vomiting but some nausea, tolerating PO. - Recent prior UTIs 3 prior within 2015, E.Coli (1/15, 4/15) and Klebsiella (6/15) all sensitive to cephalosporins, previously treated with Keflex (despite listed allergy with hives/rash) with good resolution and no reaction  Plan: 1. UA / micro - suggestive of infection with positive nitrite, mod leuks, WBC TNTC 2. Ordered urine culture 3. Empiric treatment with Keflex 500mg  TID x 10 days (since >3 months since last Keflex course, decision to repeat, will follow-up culture, may need alternative abx) 4. RTC 1 week PRN if not improving or sooner if worsening, given specific return criteria (not tolerating PO, fevers, worsening pain, vomiting)

## 2014-10-03 ENCOUNTER — Ambulatory Visit: Payer: Medicaid Other | Admitting: Internal Medicine

## 2014-10-05 ENCOUNTER — Ambulatory Visit: Payer: Medicaid Other | Admitting: Family Medicine

## 2014-10-06 ENCOUNTER — Emergency Department (HOSPITAL_COMMUNITY)
Admission: EM | Admit: 2014-10-06 | Discharge: 2014-10-06 | Disposition: A | Discharge status: Home / Self Care | Payer: Medicaid Other | Attending: Emergency Medicine | Admitting: Emergency Medicine

## 2014-10-06 ENCOUNTER — Encounter (HOSPITAL_COMMUNITY): Payer: Self-pay | Admitting: Emergency Medicine

## 2014-10-06 ENCOUNTER — Emergency Department (HOSPITAL_COMMUNITY): Payer: Medicaid Other

## 2014-10-06 DIAGNOSIS — E039 Hypothyroidism, unspecified: Secondary | ICD-10-CM | POA: Diagnosis not present

## 2014-10-06 DIAGNOSIS — Z8719 Personal history of other diseases of the digestive system: Secondary | ICD-10-CM | POA: Insufficient documentation

## 2014-10-06 DIAGNOSIS — I252 Old myocardial infarction: Secondary | ICD-10-CM | POA: Insufficient documentation

## 2014-10-06 DIAGNOSIS — E669 Obesity, unspecified: Secondary | ICD-10-CM | POA: Insufficient documentation

## 2014-10-06 DIAGNOSIS — Z7982 Long term (current) use of aspirin: Secondary | ICD-10-CM | POA: Insufficient documentation

## 2014-10-06 DIAGNOSIS — M79676 Pain in unspecified toe(s): Secondary | ICD-10-CM

## 2014-10-06 DIAGNOSIS — M545 Low back pain: Secondary | ICD-10-CM | POA: Insufficient documentation

## 2014-10-06 DIAGNOSIS — I5032 Chronic diastolic (congestive) heart failure: Secondary | ICD-10-CM | POA: Insufficient documentation

## 2014-10-06 DIAGNOSIS — G8929 Other chronic pain: Secondary | ICD-10-CM | POA: Diagnosis not present

## 2014-10-06 DIAGNOSIS — Z880 Allergy status to penicillin: Secondary | ICD-10-CM | POA: Diagnosis not present

## 2014-10-06 DIAGNOSIS — M25571 Pain in right ankle and joints of right foot: Secondary | ICD-10-CM | POA: Diagnosis present

## 2014-10-06 DIAGNOSIS — F419 Anxiety disorder, unspecified: Secondary | ICD-10-CM | POA: Insufficient documentation

## 2014-10-06 DIAGNOSIS — Z720 Tobacco use: Secondary | ICD-10-CM | POA: Diagnosis not present

## 2014-10-06 DIAGNOSIS — I10 Essential (primary) hypertension: Secondary | ICD-10-CM | POA: Insufficient documentation

## 2014-10-06 DIAGNOSIS — Z792 Long term (current) use of antibiotics: Secondary | ICD-10-CM | POA: Diagnosis not present

## 2014-10-06 DIAGNOSIS — J449 Chronic obstructive pulmonary disease, unspecified: Secondary | ICD-10-CM | POA: Diagnosis not present

## 2014-10-06 DIAGNOSIS — Z9889 Other specified postprocedural states: Secondary | ICD-10-CM | POA: Insufficient documentation

## 2014-10-06 DIAGNOSIS — R51 Headache: Secondary | ICD-10-CM | POA: Diagnosis not present

## 2014-10-06 DIAGNOSIS — M1990 Unspecified osteoarthritis, unspecified site: Secondary | ICD-10-CM | POA: Diagnosis not present

## 2014-10-06 DIAGNOSIS — M79674 Pain in right toe(s): Secondary | ICD-10-CM | POA: Diagnosis not present

## 2014-10-06 MED ORDER — HYDROCODONE-ACETAMINOPHEN 5-325 MG PO TABS: 1.0000 | ORAL_TABLET | Freq: Four times a day (QID) | ORAL | Status: DC | PRN

## 2014-10-06 NOTE — ED Provider Notes (Signed)
CSN: [id number]     Arrival date & time 10/06/14  1420 History  This chart was scribed for non-physician practitioner, Montine Circle, PA-C, working with Debby Freiberg, MD by Ladene Artist, ED Scribe. This patient was seen in room TR06C/TR06C and the patient's care was started at 3:20 PM.   Chief Complaint  Patient presents with  . Back Pain  . Ankle Pain   The history is provided by the patient. No language interpreter was used.   HPI Comments: [name] is a 58 y.o. female, with a h/o arthritis, who presents to the Emergency Department with a chief complaint of constant R lower back pain onset a few weeks ago. She denies any urinary symptoms or constipation. Pt has tried Tylenol without relief.   Pt also presents with constant R toe pain onset a few days ago. She states that she was moving a wooden crate that slipped and landed on her R second toe.   Past Medical History  Diagnosis Date  . Chest pain     a. s/p reported MI in 1997;  b. 09/2012 Cath: nl cors, nl LV ([place]);  c. 05/2012 Non-ischemic myoview.  . Hypertension   . Arthritis   . Problem with literacy   . Shortness of breath   . Anxiety   . Bronchitis   . Chronic headaches   . Obesity   . COPD (chronic obstructive pulmonary disease)   . Chronic diastolic CHF (congestive heart failure)     a. 12/2011 Echo: nl ef, Gr 1 DD;  b. 11/2012 Echo: EF 45-50%, no significant valvular abnormalities.  Marland Kitchen GERD (gastroesophageal reflux disease)   . Myocardial infarction 1997    self reported  . Hypothyroidism   . Asthma   . Chronic chest pain    Past Surgical History  Procedure Laterality Date  . Ectopic pregnancy surgery      right  . Nm myoview ltd  June 2013    no reversible ischemia  . Cardiac catheterization  October 2012    No coronary artery disease  . Cardiac catheterization  2012    normal coronary arteries  . Nm myoview ltd  2012, 2013    normal  . 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 Knee arthroscopy  08/13/13    right   Family History  Problem Relation Age of Onset  . Cancer Mother 38    lung ca  . Diabetes Mother   . Anxiety disorder Sister   . Cancer Brother 60    lung  . Colon cancer Neg Hx   . Pancreatic cancer Neg Hx   . Rectal cancer Neg Hx   . Stomach cancer Neg Hx    History  Substance Use Topics  . Smoking status: Current Every Day Smoker -- 0.20 packs/day  . Smokeless tobacco: Never Used  . Alcohol Use: No   OB History   Grav Para Term Preterm Abortions TAB SAB Ect Mult Living                 Review of Systems  Gastrointestinal: Negative for constipation.  Genitourinary: Negative.   Musculoskeletal: Positive for arthralgias and back pain.  All other systems reviewed and are negative.  Allergies  Darvocet; Ibuprofen; Tramadol; Tylenol; Celebrex; Naproxen; and Penicillins  Home Medications   Prior to Admission medications   Medication Sig Start Date End Date Taking? Authorizing Provider  aspirin EC 81  MG tablet Take 81 mg by mouth daily.    Historical Provider, MD  atorvastatin (LIPITOR) 40 MG tablet Take 40 mg by mouth daily.    Historical Provider, MD  beclomethasone (QVAR) 40 MCG/ACT inhaler Inhale 2 puffs into the lungs 2 (two) times daily.    Historical Provider, MD  cephALEXin (KEFLEX) 500 MG capsule Take 1 capsule (500 mg total) by mouth 3 (three) times daily. For 10 days 09/29/14   Nobie Putnam, DO  cyclobenzaprine (FLEXERIL) 10 MG tablet Take 1 tablet (10 mg total) by mouth 3 (three) times daily as needed for muscle spasms. 09/29/14   Nobie Putnam, DO  levothyroxine (SYNTHROID, LEVOTHROID) 75 MCG tablet Take 75 mcg by mouth daily before breakfast.    Historical Provider, MD  metoprolol succinate (TOPROL-XL) 50 MG 24 hr tablet Take 50 mg by mouth daily. Take with or immediately following a meal.    Historical Provider, MD  ondansetron (ZOFRAN) 4 MG tablet Take 1 tablet (4 mg total) by mouth  every 6 (six) hours. 08/15/14   Elwyn Lade, PA-C  oxyCODONE-acetaminophen (PERCOCET/ROXICET) 5-325 MG per tablet Take 1 tablet by mouth every 4 (four) hours as needed for severe pain. May take 2 tablets PO q 6 hours for severe pain - Do not take with Tylenol as this tablet already contains tylenol 08/15/14   Elwyn Lade, PA-C  PARoxetine (PAXIL) 20 MG tablet Take 20 mg by mouth daily.    Historical Provider, MD  senna (SENOKOT) 8.6 MG tablet Take 1 tablet by mouth daily.    Historical Provider, MD  torsemide (DEMADEX) 20 MG tablet Take 20 mg by mouth daily.     Historical Provider, MD   Triage Vitals: BP 125/77  Pulse 86  Temp(Src) 98.3 F (36.8 C) (Oral)  Resp 18  Ht 5\' 8"  (1.727 m)  Wt 214 lb (97.07 kg)  BMI 32.55 kg/m2  SpO2 97% Physical Exam  Nursing note and vitals reviewed. Constitutional: She is oriented to person, place, and time. She appears well-developed and well-nourished. No distress.  HENT:  Head: Normocephalic and atraumatic.  Eyes: Conjunctivae and EOM are normal. Right eye exhibits no discharge. Left eye exhibits no discharge. No scleral icterus.  Neck: Normal range of motion. Neck supple. No tracheal deviation present.  Cardiovascular: Normal rate, regular rhythm and normal heart sounds.  Exam reveals no gallop and no friction rub.   No murmur heard. Pulmonary/Chest: Effort normal and breath sounds normal. No respiratory distress. She has no wheezes.  Abdominal: Soft. She exhibits no distension. There is no tenderness.  Musculoskeletal: Normal range of motion.  Lumbar paraspinal muscles tender to palpation, no bony tenderness, step-offs, or gross abnormality or deformity of spine, patient is able to ambulate, moves all extremities  Bilateral great toe extension intact Bilateral plantar/dorsiflexion intact  R second toe mildly erythematous and painful at the distal phalanx No bony abnormality or deformity    Neurological: She is alert and oriented to  person, place, and time. She has normal reflexes.  Sensation and strength intact bilaterally Symmetrical reflexes  Skin: Skin is warm and dry. She is not diaphoretic.  Psychiatric: She has a normal mood and affect. Her behavior is normal. Judgment and thought content normal.   ED Course  Procedures (including critical care time) DIAGNOSTIC STUDIES: Oxygen Saturation is 97% on RA, normal by my interpretation.    COORDINATION OF CARE: 3:23 PM-Discussed treatment plan which includes XR with pt at bedside and pt agreed to plan.  Labs Review Labs Reviewed - No data to display  Imaging Review Dg Toe 2nd Right  10/06/2014   CLINICAL DATA:  Crate dropped on distal and of second toe of right foot 2 days ago. Discoloration and pain. Initial encounter.  EXAM: RIGTH SECOND TOE  COMPARISON:  06/08/2014  FINDINGS: There is no evidence of fracture or dislocation. There is no evidence of arthropathy or other focal bone abnormality. No foreign body noted.  IMPRESSION: Negative.   Electronically Signed   By: Sherryl Barters M.D.   On: 10/06/2014 16:04     EKG Interpretation None      MDM   Final diagnoses:  Toe pain  Chronic pain    Patient with back pain.  No neurological deficits and normal neuro exam.  Patient is ambulatory.  No loss of bowel or bladder control.  Doubt cauda equina.  Denies fever,  doubt epidural abscess or other lesion. Recommend back exercises, stretching, RICE, and will treat with a short course of norco.  Patient also has toe pain, no fx.  Encouraged the patient that there could be a need for additional workup and/or imaging such as MRI, if the symptoms do not resolve. Patient advised that if the back pain does not resolve, or radiates, this could progress to more serious conditions and is encouraged to follow-up with PCP or orthopedics within 2 weeks.     I personally performed the services described in this documentation, which was scribed in my presence. The  recorded information has been reviewed and is accurate.    Montine Circle, PA-C 10/06/14 [phone]

## 2014-10-06 NOTE — ED Notes (Signed)
Pts toes buddy taped and placed in post op shoe. Pts vitals updated. Pt awaiting discharge paperwork at bedside.

## 2014-10-06 NOTE — ED Notes (Signed)
Declined W/C at D/C and was escorted to lobby by RN. 

## 2014-10-06 NOTE — Discharge Instructions (Signed)
Turf Toe, with Rehab Injury to the base of the big toe (first metatarsal phalangeal joint) that causes damage to the joint capsule and ligaments is known as turf toe. Turf toe commonly occurs on the bottom side of the joint. SYMPTOMS   Pain, tenderness, inflammation and/or bruising around the big toe (contusion).  Pain that worsens with movement of the big toe, specifically when raising (extending) the toe.  Inability to walk properly on the affected foot, which causes one to limp. CAUSES  Turf toe is caused by a force being placed on the joint capsule and ligaments that is greater than they can withstand. Common mechanisms of injury include:  Repetitive and/or strenuous extension of the big toe (standing on tiptoes).  Explosive running starts (sprinters).  "Stubbing" the big toe.  Another player landing on your foot. RISK INCREASES WITH:  Previous toe injury.  Having a long first toe.  Flat feet.  Arthritis of the great toe.  Improperly fitted shoes or shoes that are not appropriate for a given activity.  Family history of foot abnormalities.  Activities that involve explosive running starts, standing on tiptoes, or jumping. PREVENTION  Wear properly fitted shoes that are appropriate for the sport or activity.  Protect the first toe by taping it to reduce motion.  Maintain physical fitness:  Strength, flexibility, and endurance.  Cardiovascular fitness. PROGNOSIS  If treated properly, the symptoms of turf toe usually resolve with non-surgical (conservative) treatment. Occasionally, surgery is necessary. RELATED COMPLICATIONS  Recurrent symptoms that result in a chronic problem.  Inability to compete in athletics.  Prolonged healing time, if improperly treated or re-injured.  Other foot injuries that occur due to protecting the first toe from pain.  Loss of motion in the first toe (hallux rigidus).  Bunion (hallux valgus). TREATMENT  Treatment initially  involves resting from any activities that aggravate the symptoms, and the use of ice and medications to help reduce pain and inflammation. The use of range-of-motion exercises may help reduce pain with activity. It is important that you wear properly fitted shoes with a stiff sole and a wide toe box, in order to reduce the pressure on the first toe. Protecting your big toe by taping it to restrict movement may allow you to return to sports earlier without pain or discomfort. If the condition becomes chronic, then your caregiver may recommend a corticosteroid injection to help reduce inflammation. If symptoms persist despite non-surgical treatment, then surgery may be recommended. MEDICATION  If pain medication is necessary, then nonsteroidal anti-inflammatory medications, such as aspirin and ibuprofen, or other minor pain relievers, such as acetaminophen, are often recommended.  Do not take pain medication for 7 days before surgery.  Prescription pain relievers may be given if deemed necessary by your caregiver. Use only as directed and only as much as you need.  Ointments applied to the skin may be helpful.  Corticosteroid injections may be given by your caregiver. These injections should be reserved for the most serious cases, because they may only be given a certain number of times. HEAT AND COLD  Cold treatment (icing) relieves pain and reduces inflammation. Cold treatment should be applied for 10 to 15 minutes every 2 to 3 hours for inflammation and pain and immediately after any activity that aggravates your symptoms. Use ice packs or massage the area with a piece of ice (ice massage).  Heat treatment may be used prior to performing the stretching and strengthening activities prescribed by your caregiver, physical therapist, or athletic  trainer. Use a heat pack or soak the injury in warm water. SEEK MEDICAL CARE IF:  Treatment seems to offer no benefit, or the condition worsens.  Any  medications produce adverse side effects. EXERCISES RANGE OF MOTION (ROM) AND STRETCHING EXERCISES - Turf Toe These exercises may help you when beginning to rehabilitate your injury. Your symptoms may resolve with or without further involvement from your physician, physical therapist, or athletic trainer. While completing these exercises, remember:  Restoring tissue flexibility helps normal motion to return to the joints. This allows healthier, less painful movement and activity.  An effective stretch should be held for at least 30 seconds.  A stretch should never be painful. You should only feel a gentle lengthening or release in the stretched tissue. RANGE OF MOTION - Toe Extension, Flexion  Sit with your right / left leg crossed over your opposite knee.  Grasp your toes and gently pull them back toward the top of your foot. You should feel a stretch on the bottom of your toes and/or foot.  Hold this stretch for __________ seconds.  Now, gently pull your toes toward the bottom of your foot. You should feel a stretch on the top of your toes and or foot.  Hold this stretch for __________ seconds. Repeat __________ times. Complete this stretch __________ times per day. RANGE OF MOTION - Ankle Plantar Flexion  Sit with your right / left leg crossed over your opposite knee.  Use your opposite hand to pull the top of your foot and toes toward you.  You should feel a gentle stretch on the top of your foot/ankle. Hold this position for __________ seconds. Repeat __________ times. Complete __________ times per day. STRENGTHENING EXERCISES - Turf Toe These exercises may help you when beginning to rehabilitate your injury. They may resolve your symptoms with or without further involvement from your physician, physical therapist, or athletic trainer. While completing these exercises, remember:  Muscles can gain both the endurance and the strength needed for everyday activities through  controlled exercises.  Complete these exercises as instructed by your physician, physical therapist, or athletic trainer. Progress with the resistance and repetition exercises only as your caregiver advises.  You may experience muscle soreness or fatigue, but the pain or discomfort you are trying to eliminate should never worsen during these exercises. If this pain does worsen, stop and make certain you are following the directions exactly. If the pain is still present after adjustments, discontinue the exercise until you can discuss the trouble with your clinician. STRENGTH - Towel Curls  Sit in a chair positioned on a non-carpeted surface.  Place your foot on a towel, keeping your heel on the floor.  Pull the towel toward your heel by only curling your toes. Keep your heel on the floor.  If instructed by your physician, physical therapist or athletic trainer, add ____________________ at the end of the towel. Repeat __________ times. Complete this exercise __________ times per day. Document Released: 12/02/2005 Document Revised: 04/18/2014 Document Reviewed: 03/16/2009 [facility] Patient Information 2015 [place], [place]. This information is not intended to replace advice given to you by your health care provider. Make sure you discuss any questions you have with your health care provider.

## 2014-10-06 NOTE — ED Notes (Signed)
Pt reports she has chronic back pain. Yesterday noted to be having increasing pain in right lower back and right ankle. Denies fever, recent injury, dysuria. Pt awake, alert, oriented x4, NAD at present.

## 2014-10-10 NOTE — ED Provider Notes (Signed)
Medical screening examination/treatment/procedure(s) were performed by non-physician practitioner and as supervising physician I was immediately available for consultation/collaboration.   EKG Interpretation None        Debby Freiberg, MD 10/10/14 1754

## 2014-11-24 ENCOUNTER — Encounter (HOSPITAL_COMMUNITY): Payer: Self-pay | Admitting: Internal Medicine

## 2014-12-03 ENCOUNTER — Emergency Department (HOSPITAL_COMMUNITY): Payer: Medicaid Other

## 2014-12-03 ENCOUNTER — Emergency Department (HOSPITAL_COMMUNITY)
Admission: EM | Admit: 2014-12-03 | Discharge: 2014-12-03 | Disposition: A | Discharge status: Home / Self Care | Payer: Medicaid Other | Attending: Emergency Medicine | Admitting: Emergency Medicine

## 2014-12-03 DIAGNOSIS — Z9889 Other specified postprocedural states: Secondary | ICD-10-CM | POA: Diagnosis not present

## 2014-12-03 DIAGNOSIS — Y998 Other external cause status: Secondary | ICD-10-CM | POA: Insufficient documentation

## 2014-12-03 DIAGNOSIS — Z880 Allergy status to penicillin: Secondary | ICD-10-CM | POA: Insufficient documentation

## 2014-12-03 DIAGNOSIS — Z7951 Long term (current) use of inhaled steroids: Secondary | ICD-10-CM | POA: Diagnosis not present

## 2014-12-03 DIAGNOSIS — Z8719 Personal history of other diseases of the digestive system: Secondary | ICD-10-CM | POA: Diagnosis not present

## 2014-12-03 DIAGNOSIS — M1990 Unspecified osteoarthritis, unspecified site: Secondary | ICD-10-CM | POA: Insufficient documentation

## 2014-12-03 DIAGNOSIS — Y9389 Activity, other specified: Secondary | ICD-10-CM | POA: Diagnosis not present

## 2014-12-03 DIAGNOSIS — J449 Chronic obstructive pulmonary disease, unspecified: Secondary | ICD-10-CM | POA: Diagnosis not present

## 2014-12-03 DIAGNOSIS — F419 Anxiety disorder, unspecified: Secondary | ICD-10-CM | POA: Insufficient documentation

## 2014-12-03 DIAGNOSIS — S299XXA Unspecified injury of thorax, initial encounter: Secondary | ICD-10-CM | POA: Diagnosis present

## 2014-12-03 DIAGNOSIS — E039 Hypothyroidism, unspecified: Secondary | ICD-10-CM | POA: Insufficient documentation

## 2014-12-03 DIAGNOSIS — I252 Old myocardial infarction: Secondary | ICD-10-CM | POA: Insufficient documentation

## 2014-12-03 DIAGNOSIS — Z7982 Long term (current) use of aspirin: Secondary | ICD-10-CM | POA: Diagnosis not present

## 2014-12-03 DIAGNOSIS — W01198A Fall on same level from slipping, tripping and stumbling with subsequent striking against other object, initial encounter: Secondary | ICD-10-CM | POA: Diagnosis not present

## 2014-12-03 DIAGNOSIS — S20211A Contusion of right front wall of thorax, initial encounter: Secondary | ICD-10-CM

## 2014-12-03 DIAGNOSIS — Z79899 Other long term (current) drug therapy: Secondary | ICD-10-CM | POA: Insufficient documentation

## 2014-12-03 DIAGNOSIS — S8001XA Contusion of right knee, initial encounter: Secondary | ICD-10-CM

## 2014-12-03 DIAGNOSIS — Z720 Tobacco use: Secondary | ICD-10-CM | POA: Diagnosis not present

## 2014-12-03 DIAGNOSIS — E669 Obesity, unspecified: Secondary | ICD-10-CM | POA: Diagnosis not present

## 2014-12-03 DIAGNOSIS — Y9289 Other specified places as the place of occurrence of the external cause: Secondary | ICD-10-CM | POA: Diagnosis not present

## 2014-12-03 DIAGNOSIS — I5032 Chronic diastolic (congestive) heart failure: Secondary | ICD-10-CM | POA: Insufficient documentation

## 2014-12-03 DIAGNOSIS — I10 Essential (primary) hypertension: Secondary | ICD-10-CM | POA: Diagnosis not present

## 2014-12-03 DIAGNOSIS — G8929 Other chronic pain: Secondary | ICD-10-CM | POA: Insufficient documentation

## 2014-12-03 MED ORDER — HYDROCODONE-ACETAMINOPHEN 5-325 MG PO TABS: 1.0000 | ORAL_TABLET | ORAL | Status: DC | PRN

## 2014-12-03 MED ORDER — ONDANSETRON 4 MG PO TBDP
4.0000 mg | ORAL_TABLET | Freq: Once | ORAL | Status: AC
  Administered 2014-12-03: 4 mg via ORAL
  Filled 2014-12-03: qty 1

## 2014-12-03 MED ORDER — MORPHINE SULFATE 4 MG/ML IJ SOLN
4.0000 mg | Freq: Once | INTRAMUSCULAR | Status: AC
  Administered 2014-12-03: 4 mg via INTRAMUSCULAR
  Filled 2014-12-03: qty 1

## 2014-12-03 NOTE — ED Notes (Signed)
Bed: WA09 Expected date: 12/03/14 Expected time: 4:25 PM Means of arrival: Ambulance Comments: Fall

## 2014-12-03 NOTE — Discharge Instructions (Signed)

## 2014-12-03 NOTE — ED Notes (Signed)
She states she tripped as she was removing her sandals this morning after having walked her dog.  She fell forward, striking her right ribs area on the counter edge.  She c/o pain at right knee and right ribs just beneath her right breast.  I see no ecchymosis beneath breast; and she denies striking her head or neck.  Her only other c/o os of sm. Bruise at ant. Right bicep area, however she denies any pain or discomfort at this area.

## 2014-12-03 NOTE — ED Provider Notes (Signed)
CSN: [id number]     Arrival date & time 12/03/14  1631 History   First MD Initiated Contact with Patient 12/03/14 1637     No chief complaint on file.    (Consider location/radiation/quality/duration/timing/severity/associated sxs/prior Treatment) HPI Comments: Patient presents with rib pain. She states that she was wearing flip-flops this morning and she tripped on her flip-flops and fell over hitting her right ribs on the kitchen counter. She's complained of constant throbbing pain to her right ribs since that time. It's been worsening this afternoon. Have him out 12:00 this afternoon. She took a dose of Tylenol with no improvement of symptoms. She denies hitting her head. She denies any neck or back pain. She also has some pain to her right knee where she hit it on the sofa. She denies any shortness of breath. She says the pain is worse with coughing and breathing. Also worse with movement. She's not on any anticoagulants other than a baby aspirin.   Past Medical History  Diagnosis Date  . Chest pain     a. s/p reported MI in 1997;  b. 09/2012 Cath: nl cors, nl LV ([place]);  c. 05/2012 Non-ischemic myoview.  . Hypertension   . Arthritis   . Problem with literacy   . Shortness of breath   . Anxiety   . Bronchitis   . Chronic headaches   . Obesity   . COPD (chronic obstructive pulmonary disease)   . Chronic diastolic CHF (congestive heart failure)     a. 12/2011 Echo: nl ef, Gr 1 DD;  b. 11/2012 Echo: EF 45-50%, no significant valvular abnormalities.  Marland Kitchen GERD (gastroesophageal reflux disease)   . Myocardial infarction 1997    self reported  . Hypothyroidism   . Asthma   . Chronic chest pain    Past Surgical History  Procedure Laterality Date  . Ectopic pregnancy surgery      right  . Nm myoview ltd  June 2013    no reversible ischemia  . Cardiac catheterization  October 2012    No coronary artery disease  . Cardiac catheterization  2012    normal coronary arteries  . Nm  myoview ltd  2012, 2013    normal  . 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 Knee arthroscopy  08/13/13    right  . V-tach ablation N/A 02/09/2013    Procedure: V-TACH ABLATION;  Surgeon: Thompson Grayer, MD;  Location: [facility] CATH LAB;  Service: Cardiovascular;  Laterality: N/A;   Family History  Problem Relation Age of Onset  . Cancer Mother 46    lung ca  . Diabetes Mother   . Anxiety disorder Sister   . Cancer Brother 60    lung  . Colon cancer Neg Hx   . Pancreatic cancer Neg Hx   . Rectal cancer Neg Hx   . Stomach cancer Neg Hx    History  Substance Use Topics  . Smoking status: Current Every Day Smoker -- 0.20 packs/day  . Smokeless tobacco: Never Used  . Alcohol Use: No   OB History    No data available     Review of Systems  Constitutional: Negative for fever, chills, diaphoresis and fatigue.  HENT: Negative for congestion, rhinorrhea and sneezing.   Eyes: Negative.   Respiratory: Negative for cough, chest tightness and shortness of breath.   Cardiovascular: Positive for chest pain (rib pain). Negative for leg swelling.  Gastrointestinal:  Negative for nausea, vomiting, abdominal pain, diarrhea and blood in stool.  Genitourinary: Negative for frequency, hematuria, flank pain and difficulty urinating.  Musculoskeletal: Positive for arthralgias. Negative for back pain.  Skin: Negative for rash.  Neurological: Negative for dizziness, speech difficulty, weakness, numbness and headaches.      Allergies  Darvocet; Ibuprofen; Tramadol; Tylenol; Celebrex; Naproxen; and Penicillins  Home Medications   Prior to Admission medications   Medication Sig Start Date End Date Taking? Authorizing Provider  aspirin EC 81 MG tablet Take 81 mg by mouth daily.   Yes Historical Provider, MD  atorvastatin (LIPITOR) 40 MG tablet Take 40 mg by mouth daily.   Yes Historical Provider, MD  cyclobenzaprine (FLEXERIL) 10 MG tablet Take 1 tablet (10 mg total)  by mouth 3 (three) times daily as needed for muscle spasms. 09/29/14  Yes Alexander Parks Ranger, DO  levothyroxine (SYNTHROID, LEVOTHROID) 75 MCG tablet Take 75 mcg by mouth daily before breakfast.   Yes Historical Provider, MD  metoprolol succinate (TOPROL-XL) 50 MG 24 hr tablet Take 50 mg by mouth daily. Take with or immediately following a meal.   Yes Historical Provider, MD  ondansetron (ZOFRAN) 4 MG tablet Take 4 mg by mouth every 6 (six) hours as needed for nausea or vomiting.   Yes Historical Provider, MD  PARoxetine (PAXIL) 20 MG tablet Take 20 mg by mouth daily.   Yes Historical Provider, MD  senna (SENOKOT) 8.6 MG tablet Take 1 tablet by mouth daily.   Yes Historical Provider, MD  torsemide (DEMADEX) 20 MG tablet Take 20 mg by mouth daily.    Yes Historical Provider, MD  beclomethasone (QVAR) 40 MCG/ACT inhaler Inhale 2 puffs into the lungs 2 (two) times daily.    Historical Provider, MD  cephALEXin (KEFLEX) 500 MG capsule Take 1 capsule (500 mg total) by mouth 3 (three) times daily. For 10 days Patient not taking: Reported on 12/03/2014 09/29/14   Nobie Putnam, DO  HYDROcodone-acetaminophen (NORCO/VICODIN) 5-325 MG per tablet Take 1-2 tablets by mouth every 4 (four) hours as needed. 12/03/14   Malvin Johns, MD   BP 123/74 mmHg  Pulse 60  Temp(Src) 97.7 F (36.5 C) (Oral)  Resp 17  SpO2 99% Physical Exam  Constitutional: She is oriented to person, place, and time. She appears well-developed and well-nourished.  HENT:  Head: Normocephalic and atraumatic.  Eyes: Pupils are equal, round, and reactive to light.  Neck: Normal range of motion. Neck supple.  Cardiovascular: Normal rate, regular rhythm and normal heart sounds.   Pulmonary/Chest: Effort normal and breath sounds normal. No respiratory distress. She has no wheezes. She has no rales. She exhibits tenderness (Positive tenderness under the right breast along the rib cage. No crepitus or deformity is noted. There is no  ecchymosis.).  Abdominal: Soft. Bowel sounds are normal. There is no tenderness. There is no rebound and no guarding.  No signs of external trauma to the abdomen is noted  Musculoskeletal: Normal range of motion. She exhibits no edema.  Positive tenderness on palpation of the anterior right knee over the patella. There is no other bony tenderness on palpation or range of motion extremities. She was complaining of some mild bruising type pain to her right upper arm but I don't elicit any reproducible tenderness. There is no pain along the spine.  Lymphadenopathy:    She has no cervical adenopathy.  Neurological: She is alert and oriented to person, place, and time.  Skin: Skin is warm and dry. No rash noted.  Psychiatric: She has a normal mood and affect.    ED Course  Procedures (including critical care time) Labs Review Labs Reviewed - No data to display  Imaging Review Dg Chest 2 View  12/03/2014   CLINICAL DATA:  Cough with shortness of breath for 3 weeks. Chest pain.  EXAM: CHEST  2 VIEW  COMPARISON:  07/30/2014.  FINDINGS: The heart size and mediastinal contours are within normal limits. Both lungs are clear. The visualized skeletal structures are unremarkable.  IMPRESSION: No active cardiopulmonary disease.  Stable chest.   Electronically Signed   By: Rolla Flatten M.D.   On: 12/03/2014 18:39   Dg Knee Complete 4 Views Right  12/03/2014   CLINICAL DATA:  Right knee pain for several months  EXAM: RIGHT KNEE - COMPLETE 4+ VIEW  COMPARISON:  Radiographs 06/08/2014  FINDINGS: There is mild narrowing of the medial compartment with mild osteophytosis. There is a joint effusion present.  IMPRESSION: 1. Mild osteoarthritis of the medial compartment. 2. Small joint effusion. 3. No change prior.   Electronically Signed   By: Suzy Bouchard M.D.   On: 12/03/2014 18:40     EKG Interpretation None      MDM   Final diagnoses:  Rib contusion, right, initial encounter  Knee contusion,  right, initial encounter    No fractures are seen. There is no evidence of pneumothorax. She was discharged home in good condition. She was given a prescription for Vicodin for pain. She was encouraged to follow-up with her primary care physician if her symptoms are not improving. She was advised to return here if she has any worsening pain or shortness of breath. She was also discharged with an incentive spirometer.    Malvin Johns, MD 12/03/14 [phone]

## 2014-12-10 ENCOUNTER — Encounter (HOSPITAL_COMMUNITY): Payer: Self-pay | Admitting: Emergency Medicine

## 2014-12-10 ENCOUNTER — Emergency Department (HOSPITAL_COMMUNITY)
Admission: EM | Admit: 2014-12-10 | Discharge: 2014-12-11 | Disposition: A | Discharge status: Home / Self Care | Payer: Medicaid Other | Attending: Emergency Medicine | Admitting: Emergency Medicine

## 2014-12-10 DIAGNOSIS — R1030 Lower abdominal pain, unspecified: Secondary | ICD-10-CM

## 2014-12-10 DIAGNOSIS — I5032 Chronic diastolic (congestive) heart failure: Secondary | ICD-10-CM | POA: Insufficient documentation

## 2014-12-10 DIAGNOSIS — Z7982 Long term (current) use of aspirin: Secondary | ICD-10-CM | POA: Insufficient documentation

## 2014-12-10 DIAGNOSIS — S8991XA Unspecified injury of right lower leg, initial encounter: Secondary | ICD-10-CM | POA: Diagnosis not present

## 2014-12-10 DIAGNOSIS — K219 Gastro-esophageal reflux disease without esophagitis: Secondary | ICD-10-CM | POA: Insufficient documentation

## 2014-12-10 DIAGNOSIS — Z9889 Other specified postprocedural states: Secondary | ICD-10-CM | POA: Insufficient documentation

## 2014-12-10 DIAGNOSIS — Z79899 Other long term (current) drug therapy: Secondary | ICD-10-CM | POA: Diagnosis not present

## 2014-12-10 DIAGNOSIS — J449 Chronic obstructive pulmonary disease, unspecified: Secondary | ICD-10-CM | POA: Diagnosis not present

## 2014-12-10 DIAGNOSIS — M25561 Pain in right knee: Secondary | ICD-10-CM

## 2014-12-10 DIAGNOSIS — M1990 Unspecified osteoarthritis, unspecified site: Secondary | ICD-10-CM | POA: Diagnosis not present

## 2014-12-10 DIAGNOSIS — Y998 Other external cause status: Secondary | ICD-10-CM | POA: Insufficient documentation

## 2014-12-10 DIAGNOSIS — I10 Essential (primary) hypertension: Secondary | ICD-10-CM | POA: Diagnosis not present

## 2014-12-10 DIAGNOSIS — Z720 Tobacco use: Secondary | ICD-10-CM | POA: Diagnosis not present

## 2014-12-10 DIAGNOSIS — Z880 Allergy status to penicillin: Secondary | ICD-10-CM | POA: Diagnosis not present

## 2014-12-10 DIAGNOSIS — Y9389 Activity, other specified: Secondary | ICD-10-CM | POA: Insufficient documentation

## 2014-12-10 DIAGNOSIS — R112 Nausea with vomiting, unspecified: Secondary | ICD-10-CM | POA: Diagnosis not present

## 2014-12-10 DIAGNOSIS — S299XXA Unspecified injury of thorax, initial encounter: Secondary | ICD-10-CM | POA: Diagnosis present

## 2014-12-10 DIAGNOSIS — I252 Old myocardial infarction: Secondary | ICD-10-CM | POA: Diagnosis not present

## 2014-12-10 DIAGNOSIS — W1839XA Other fall on same level, initial encounter: Secondary | ICD-10-CM | POA: Insufficient documentation

## 2014-12-10 DIAGNOSIS — Y9289 Other specified places as the place of occurrence of the external cause: Secondary | ICD-10-CM | POA: Diagnosis not present

## 2014-12-10 DIAGNOSIS — H9311 Tinnitus, right ear: Secondary | ICD-10-CM | POA: Diagnosis not present

## 2014-12-10 DIAGNOSIS — E669 Obesity, unspecified: Secondary | ICD-10-CM | POA: Diagnosis not present

## 2014-12-10 DIAGNOSIS — G8929 Other chronic pain: Secondary | ICD-10-CM | POA: Insufficient documentation

## 2014-12-10 DIAGNOSIS — S59911A Unspecified injury of right forearm, initial encounter: Secondary | ICD-10-CM | POA: Diagnosis not present

## 2014-12-10 DIAGNOSIS — R42 Dizziness and giddiness: Secondary | ICD-10-CM

## 2014-12-10 DIAGNOSIS — E039 Hypothyroidism, unspecified: Secondary | ICD-10-CM | POA: Insufficient documentation

## 2014-12-10 DIAGNOSIS — F419 Anxiety disorder, unspecified: Secondary | ICD-10-CM | POA: Diagnosis not present

## 2014-12-10 DIAGNOSIS — R0789 Other chest pain: Secondary | ICD-10-CM

## 2014-12-10 LAB — CBC WITH DIFFERENTIAL/PLATELET
Basophils Absolute: 0 K/uL (ref 0.0–0.1)
Basophils Relative: 1 % (ref 0–1)
Eosinophils Absolute: 0.3 K/uL (ref 0.0–0.7)
Eosinophils Relative: 3 % (ref 0–5)
HCT: 40.9 % (ref 36.0–46.0)
Hemoglobin: 13.5 g/dL (ref 12.0–15.0)
Lymphocytes Relative: 31 % (ref 12–46)
Lymphs Abs: 2.7 K/uL (ref 0.7–4.0)
MCH: 28.1 pg (ref 26.0–34.0)
MCHC: 33 g/dL (ref 30.0–36.0)
MCV: 85.2 fL (ref 78.0–100.0)
Monocytes Absolute: 0.6 K/uL (ref 0.1–1.0)
Monocytes Relative: 7 % (ref 3–12)
Neutro Abs: 5.2 K/uL (ref 1.7–7.7)
Neutrophils Relative %: 58 % (ref 43–77)
Platelets: 272 K/uL (ref 150–400)
RBC: 4.8 MIL/uL (ref 3.87–5.11)
RDW: 14.2 % (ref 11.5–15.5)
WBC: 8.8 K/uL (ref 4.0–10.5)

## 2014-12-10 LAB — URINALYSIS, ROUTINE W REFLEX MICROSCOPIC
Bilirubin Urine: NEGATIVE
Glucose, UA: NEGATIVE mg/dL
Hgb urine dipstick: NEGATIVE
Ketones, ur: NEGATIVE mg/dL
Leukocytes, UA: NEGATIVE
Nitrite: NEGATIVE
Protein, ur: NEGATIVE mg/dL
Specific Gravity, Urine: 1.004 — ABNORMAL LOW (ref 1.005–1.030)
Urobilinogen, UA: 0.2 mg/dL (ref 0.0–1.0)
pH: 6 (ref 5.0–8.0)

## 2014-12-10 LAB — COMPREHENSIVE METABOLIC PANEL WITH GFR
ALT: 13 U/L (ref 0–35)
AST: 19 U/L (ref 0–37)
Albumin: 4.3 g/dL (ref 3.5–5.2)
Alkaline Phosphatase: 110 U/L (ref 39–117)
Anion gap: 8 (ref 5–15)
BUN: 17 mg/dL (ref 6–23)
CO2: 25 mmol/L (ref 19–32)
Calcium: 9.3 mg/dL (ref 8.4–10.5)
Chloride: 104 meq/L (ref 96–112)
Creatinine, Ser: 0.79 mg/dL (ref 0.50–1.10)
GFR calc Af Amer: >90 mL/min (ref >90)
GFR calc non Af Amer: >90 mL/min (ref >90)
Glucose, Bld: 92 mg/dL (ref 70–99)
Potassium: 4.2 mmol/L (ref 3.5–5.1)
Sodium: 137 mmol/L (ref 135–145)
Total Bilirubin: 0.6 mg/dL (ref 0.3–1.2)
Total Protein: 7.5 g/dL (ref 6.0–8.3)

## 2014-12-10 LAB — LIPASE, BLOOD: Lipase: 35 U/L (ref 11–59)

## 2014-12-10 LAB — I-STAT TROPONIN, ED: Troponin i, poc: 0 ng/mL (ref 0.00–0.08)

## 2014-12-10 MED ORDER — MECLIZINE HCL 25 MG PO TABS
25.0000 mg | ORAL_TABLET | Freq: Once | ORAL | Status: AC
  Administered 2014-12-10: 25 mg via ORAL
  Filled 2014-12-10: qty 1

## 2014-12-10 MED ORDER — PROMETHAZINE HCL 25 MG/ML IJ SOLN
12.5000 mg | Freq: Once | INTRAMUSCULAR | Status: AC
  Administered 2014-12-11: 12.5 mg via INTRAVENOUS
  Filled 2014-12-10: qty 1

## 2014-12-10 MED ORDER — ONDANSETRON 8 MG PO TBDP
8.0000 mg | ORAL_TABLET | Freq: Once | ORAL | Status: AC
  Administered 2014-12-10: 8 mg via ORAL
  Filled 2014-12-10: qty 1

## 2014-12-10 MED ORDER — DIAZEPAM 5 MG/ML IJ SOLN
5.0000 mg | Freq: Once | INTRAMUSCULAR | Status: AC
  Administered 2014-12-11: 5 mg via INTRAVENOUS
  Filled 2014-12-10: qty 2

## 2014-12-10 MED ORDER — MORPHINE SULFATE 4 MG/ML IJ SOLN
4.0000 mg | Freq: Once | INTRAMUSCULAR | Status: AC
  Administered 2014-12-10: 4 mg via INTRAVENOUS
  Filled 2014-12-10: qty 1

## 2014-12-10 MED ORDER — SODIUM CHLORIDE 0.9 % IV BOLUS (SEPSIS)
1000.0000 mL | INTRAVENOUS | Status: AC
  Administered 2014-12-10: 1000 mL via INTRAVENOUS

## 2014-12-10 MED ORDER — ONDANSETRON HCL 4 MG/2ML IJ SOLN
4.0000 mg | Freq: Once | INTRAMUSCULAR | Status: AC
  Administered 2014-12-10: 4 mg via INTRAVENOUS
  Filled 2014-12-10: qty 2

## 2014-12-10 NOTE — ED Provider Notes (Signed)
CSN: [id number]     Arrival date & time 12/10/14  1851 History   First MD Initiated Contact with Patient 12/10/14 2138     Chief Complaint  Patient presents with  . Abdominal Pain  . Pleurisy  . Dizziness     (Consider location/radiation/quality/duration/timing/severity/associated sxs/prior Treatment) The history is provided by the patient and medical records. No language interpreter was used.     [name] is a 58 y.o. female  with a hx of HTN, arthritis,  presents to the Emergency Department complaining of intermittent episodes of dizziness onset this morning after waking. Patient with associated emesis noted to be present after episodes of dizziness. Patient also endorses generalized abdominal pain after several bouts of emesis. Patient reports that abdominal pain did not begin first. Patient also states that she fell one week ago, striking her right knee right arm and right chest on a countertop. She reports that she's continued to have bruising and pain to her right knee and pain in her right chest. She reports her right chest pain is exacerbated by movement, palpation, coughing and deep breathing.  She reports that she has taken all the Vicodin given to her in continues to have pain. She requests a refill.  Patient denies numbness, tingling, weakness, slurred speech or gait disturbance. She reports that she is ambulating without difficulty but does feel a need to hold onto something and she feels dizzy.  She has not attempted any over-the-counter treatments for these symptoms. She denies fever, chills, headache, neck pain, neck stiffness, shortness of breath, diarrhea, weakness, to be, dysuria.  Past Medical History  Diagnosis Date  . Chest pain     a. s/p reported MI in 1997;  b. 09/2012 Cath: nl cors, nl LV ([place]);  c. 05/2012 Non-ischemic myoview.  . Hypertension   . Arthritis   . Problem with literacy   . Shortness of breath   . Anxiety   . Bronchitis   . Chronic  headaches   . Obesity   . COPD (chronic obstructive pulmonary disease)   . Chronic diastolic CHF (congestive heart failure)     a. 12/2011 Echo: nl ef, Gr 1 DD;  b. 11/2012 Echo: EF 45-50%, no significant valvular abnormalities.  Marland Kitchen GERD (gastroesophageal reflux disease)   . Myocardial infarction 1997    self reported  . Hypothyroidism   . Asthma   . Chronic chest pain    Past Surgical History  Procedure Laterality Date  . Ectopic pregnancy surgery      right  . Nm myoview ltd  June 2013    no reversible ischemia  . Cardiac catheterization  October 2012    No coronary artery disease  . Cardiac catheterization  2012    normal coronary arteries  . Nm myoview ltd  2012, 2013    normal  . 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 Knee arthroscopy  08/13/13    right  . V-tach ablation N/A 02/09/2013    Procedure: V-TACH ABLATION;  Surgeon: Thompson Grayer, MD;  Location: [facility] CATH LAB;  Service: Cardiovascular;  Laterality: N/A;   Family History  Problem Relation Age of Onset  . Cancer Mother 70    lung ca  . Diabetes Mother   . Anxiety disorder Sister   . Cancer Brother 60    lung  . Colon cancer Neg Hx   . Pancreatic cancer Neg Hx   .  Rectal cancer Neg Hx   . Stomach cancer Neg Hx    History  Substance Use Topics  . Smoking status: Current Every Day Smoker -- 0.20 packs/day  . Smokeless tobacco: Never Used  . Alcohol Use: No   OB History    No data available     Review of Systems  Constitutional: Negative for fever, diaphoresis, appetite change, fatigue and unexpected weight change.  HENT: Positive for tinnitus (Right ear). Negative for mouth sores.   Eyes: Negative for visual disturbance.  Respiratory: Negative for cough, chest tightness, shortness of breath and wheezing.   Cardiovascular: Positive for chest pain.  Gastrointestinal: Negative for nausea, vomiting, abdominal pain, diarrhea and constipation.  Endocrine: Negative for  polydipsia, polyphagia and polyuria.  Genitourinary: Negative for dysuria, urgency, frequency and hematuria.  Musculoskeletal: Positive for arthralgias (right knee). Negative for back pain and neck stiffness.  Skin: Negative for rash.  Allergic/Immunologic: Negative for immunocompromised state.  Neurological: Positive for dizziness. Negative for syncope, light-headedness and headaches.  Hematological: Does not bruise/bleed easily.  Psychiatric/Behavioral: Negative for sleep disturbance. The patient is not nervous/anxious.       Allergies  Darvocet; Ibuprofen; Tramadol; Tylenol; Celebrex; Naproxen; and Penicillins  Home Medications   Prior to Admission medications   Medication Sig Start Date End Date Taking? Authorizing Provider  aspirin EC 81 MG tablet Take 81 mg by mouth daily.   Yes Historical Provider, MD  atorvastatin (LIPITOR) 40 MG tablet Take 40 mg by mouth daily.   Yes Historical Provider, MD  beclomethasone (QVAR) 40 MCG/ACT inhaler Inhale 2 puffs into the lungs 2 (two) times daily.   Yes Historical Provider, MD  cyclobenzaprine (FLEXERIL) 10 MG tablet Take 1 tablet (10 mg total) by mouth 3 (three) times daily as needed for muscle spasms. 09/29/14  Yes Alexander Parks Ranger, DO  levothyroxine (SYNTHROID, LEVOTHROID) 75 MCG tablet Take 75 mcg by mouth daily before breakfast.   Yes Historical Provider, MD  metoprolol succinate (TOPROL-XL) 50 MG 24 hr tablet Take 50 mg by mouth daily. Take with or immediately following a meal.   Yes Historical Provider, MD  ondansetron (ZOFRAN) 4 MG tablet Take 4 mg by mouth every 6 (six) hours as needed for nausea or vomiting.   Yes Historical Provider, MD  PARoxetine (PAXIL) 20 MG tablet Take 20 mg by mouth daily.   Yes Historical Provider, MD  senna (SENOKOT) 8.6 MG tablet Take 1 tablet by mouth daily.   Yes Historical Provider, MD  torsemide (DEMADEX) 20 MG tablet Take 20 mg by mouth daily.    Yes Historical Provider, MD  cephALEXin (KEFLEX)  500 MG capsule Take 1 capsule (500 mg total) by mouth 3 (three) times daily. For 10 days Patient not taking: Reported on 12/03/2014 09/29/14   Nobie Putnam, DO  HYDROcodone-acetaminophen (NORCO/VICODIN) 5-325 MG per tablet Take 1-2 tablets by mouth every 4 (four) hours as needed. Patient not taking: Reported on 12/10/2014 12/03/14   Malvin Johns, MD  meclizine (ANTIVERT) 50 MG tablet Take 1 tablet (50 mg total) by mouth 3 (three) times daily as needed. 12/11/14   Asenath Balash, PA-C  promethazine (PHENERGAN) 25 MG tablet Take 1 tablet (25 mg total) by mouth every 6 (six) hours as needed for nausea or vomiting. 12/11/14   Airiana Elman, PA-C   BP 123/68 mmHg  Pulse 66  Temp(Src) 98.7 F (37.1 C) (Oral)  Resp 18  SpO2 97% Physical Exam  Constitutional: She is oriented to person, place, and time. She appears well-developed  and well-nourished. No distress.  HENT:  Head: Normocephalic and atraumatic.  Mouth/Throat: Oropharynx is clear and moist.  Eyes: Conjunctivae and EOM are normal. Pupils are equal, round, and reactive to light. No scleral icterus.  Slowly fatiguing horizontal nystagmus noted, worse with right gaze No vertical or rotational nystagmus  Neck: Normal range of motion. Neck supple.  Full active and passive ROM without pain No midline or paraspinal tenderness No nuchal rigidity or meningeal signs  Cardiovascular: Normal rate, regular rhythm and intact distal pulses.   Pulmonary/Chest: Effort normal and breath sounds normal. No respiratory distress. She has no wheezes. She has no rales. She exhibits tenderness (tenderness palpation of the right anterior chest).  Abdominal: Soft. Bowel sounds are normal. There is no tenderness. There is no rebound, no guarding and no CVA tenderness.  Patient reports generalized abdominal soreness with palpation but no tenderness, rebound, peritoneal signs or guarding. No CVA tenderness  Musculoskeletal: Normal range of  motion.  Lymphadenopathy:    She has no cervical adenopathy.  Neurological: She is alert and oriented to person, place, and time. She has normal reflexes. No cranial nerve deficit. She exhibits normal muscle tone. Coordination normal.  Mental Status:  Alert, oriented, thought content appropriate. Speech fluent without evidence of aphasia. Able to follow 2 step commands without difficulty.  Cranial Nerves:  II:  Peripheral visual fields grossly normal, pupils equal, round, reactive to light III,IV, VI: ptosis not present, extra-ocular motions intact bilaterally  V,VII: smile symmetric, facial light touch sensation equal VIII: hearing grossly normal bilaterally  IX,X: gag reflex present  XI: bilateral shoulder shrug equal and strong XII: midline tongue extension  Motor:  5/5 in upper and lower extremities bilaterally including strong and equal grip strength and dorsiflexion/plantar flexion Sensory: Pinprick and light touch normal in all extremities.  Deep Tendon Reflexes: 2+ and symmetric  Cerebellar: normal finger-to-nose with bilateral upper extremities Gait: normal gait, but slightly off balance CV: distal pulses palpable throughout   Skin: Skin is warm and dry. No rash noted. She is not diaphoretic.  Psychiatric: She has a normal mood and affect. Her behavior is normal. Judgment and thought content normal.  Nursing note and vitals reviewed.   ED Course  Procedures (including critical care time) Labs Review Labs Reviewed  URINALYSIS, ROUTINE W REFLEX MICROSCOPIC - Abnormal; Notable for the following:    Color, Urine STRAW (*)    Specific Gravity, Urine 1.004 (*)    All other components within normal limits  CBC WITH DIFFERENTIAL  COMPREHENSIVE METABOLIC PANEL  LIPASE, BLOOD  I-STAT TROPOININ, ED    Imaging Review No results found.   EKG Interpretation None       Date: 12/11/2014  Rate: 79  Rhythm: normal sinus rhythm  QRS Axis: normal  Intervals: normal  ST/T  Wave abnormalities: normal  Conduction Disutrbances:none  Narrative Interpretation: Nonischemic ECG, unchanged from 08/12/2014  Old EKG Reviewed: unchanged    MDM   Final diagnoses:  Lower abdominal pain  Vertigo  Non-intractable vomiting with nausea, vomiting of unspecified type  Right knee pain  Chest wall pain   Jakeisha Stricker presents with myriad symptoms.  Patient's right arm pain right knee pain and right chest pain are secondary to her fall approximately 1 week ago. Chest pain is reproducible in nature and began immediately after the fall.  EKG nonischemic and troponin negative. Highly doubt ACS.  Using Avinza abdominal pain but reports that this began only after several courses of vomiting. Abdomen is soft  without tenderness. No rebound or guarding.  Believe this is secondary to several episodes of vomiting. Patient's vomiting began after she became dizzy with discussion of room spinning and mild right-sided ptosis. On exam patient with right-sided horizontal nystagmus consistent with peripheral better ago. Patient without signs or symptoms of centralized vertigo. Will give some symptomatic treatments and reassess.  1:12 AM Patient with unilateral nystagmus, negative skew test and normal neurologic exam.  No vertical or rotational nystagmus. Patient normal finger-nose and normal gait.  No slurred speech or weakness.  Doubt CVA or other central cause of vertigo.  History and physical consistent with peripheral vertigo symptoms.  We'll discharge home with meclizine and phenergan. Patient instructed to followup with her primary care physician or neurology within 3 days for further evaluation. They are to return to the emergency department for new neurologic symptoms, loss of vision or other concerning symptoms.   I have personally reviewed patient's vitals, nursing note and any pertinent labs or imaging.  I performed an undressed physical exam.    It has been determined that no  acute conditions requiring further emergency intervention are present at this time. The patient/guardian have been advised of the diagnosis and plan. I reviewed all labs and imaging including any potential incidental findings. We have discussed signs and symptoms that warrant return to the ED and they are listed in the discharge instructions.    Vital signs are stable at discharge.   BP 123/68 mmHg  Pulse 66  Temp(Src) 98.7 F (37.1 C) (Oral)  Resp 18  SpO2 97%  The patient was discussed with and seen by Dr. Wilson Singer who agrees with the treatment plan.        Jarrett Soho Devlon Dosher, PA-C 12/11/14 0113  Virgel Manifold, MD 12/12/14 [phone]

## 2014-12-10 NOTE — ED Notes (Signed)
Pt reports that she was seen on Saturday for a fall and has not been feeling well since then. Pt states that today she awoke with n/v, feeling lightheaded and mild abdominal and back pain. Pt a&ox4, skin warm and dry, ambulatory to triage but still complains of R knee pain since her fall.

## 2014-12-11 MED ORDER — MECLIZINE HCL 50 MG PO TABS: 50.0000 mg | ORAL_TABLET | Freq: Three times a day (TID) | ORAL | Status: AC | PRN

## 2014-12-11 MED ORDER — PROMETHAZINE HCL 25 MG PO TABS
25.0000 mg | ORAL_TABLET | Freq: Four times a day (QID) | ORAL | Status: DC | PRN
Start: 2014-12-11 — End: 2015-01-04

## 2014-12-11 NOTE — ED Provider Notes (Signed)
Medical screening examination/treatment/procedure(s) were conducted as a shared visit with non-physician practitioner(s) and myself.  I personally evaluated the patient during the encounter.   EKG Interpretation None     58yf with vertigo. Consistent with peripheral etiology. Exacerbated by movements. Improved when still. Horizontal nystagmus noted by Jarrett Soho, although I did not appreciate it on my exam.  Tinnitus. Nonfocal neuro exam. Nothing is making me particularly concerned for central etiology. Plan symptomatic tx at this time.   Virgel Manifold, MD 12/11/14 [phone]

## 2014-12-11 NOTE — Discharge Instructions (Signed)
1. Medications: Valium, meclizine, usual home medications 2. Treatment: rest, drink plenty of fluids,  3. Follow Up: Please followup with your primary doctor in 3 days for discussion of your diagnoses and further evaluation after today's visit; if you do not have a primary care doctor use the resource guide provided to find one; Please return to the ER for worsening symptoms, persistent vomiting, high fevers  Benign Positional Vertigo Vertigo means you feel like you or your surroundings are moving when they are not. Benign positional vertigo is the most common form of vertigo. Benign means that the cause of your condition is not serious. Benign positional vertigo is more common in older adults. CAUSES  Benign positional vertigo is the result of an upset in the labyrinth system. This is an area in the middle ear that helps control your balance. This may be caused by a viral infection, head injury, or repetitive motion. However, often no specific cause is found. SYMPTOMS  Symptoms of benign positional vertigo occur when you move your head or eyes in different directions. Some of the symptoms may include:  Loss of balance and falls.  Vomiting.  Blurred vision.  Dizziness.  Nausea.  Involuntary eye movements (nystagmus). DIAGNOSIS  Benign positional vertigo is usually diagnosed by physical exam. If the specific cause of your benign positional vertigo is unknown, your caregiver may perform imaging tests, such as magnetic resonance imaging (MRI) or computed tomography (CT). TREATMENT  Your caregiver may recommend movements or procedures to correct the benign positional vertigo. Medicines such as meclizine, benzodiazepines, and medicines for nausea may be used to treat your symptoms. In rare cases, if your symptoms are caused by certain conditions that affect the inner ear, you may need surgery. HOME CARE INSTRUCTIONS   Follow your caregiver's instructions.  Move slowly. Do not make sudden  body or head movements.  Avoid driving.  Avoid operating heavy machinery.  Avoid performing any tasks that would be dangerous to you or others during a vertigo episode.  Drink enough fluids to keep your urine clear or pale yellow. SEEK IMMEDIATE MEDICAL CARE IF:   You develop problems with walking, weakness, numbness, or using your arms, hands, or legs.  You have difficulty speaking.  You develop severe headaches.  Your nausea or vomiting continues or gets worse.  You develop visual changes.  Your family or friends notice any behavioral changes.  Your condition gets worse.  You have a fever.  You develop a stiff neck or sensitivity to light. MAKE SURE YOU:   Understand these instructions.  Will watch your condition.  Will get help right away if you are not doing well or get worse. Document Released: 09/09/2006 Document Revised: 02/24/2012 Document Reviewed: 08/22/2011 [facility] Patient Information 2015 [place], [place]. This information is not intended to replace advice given to you by your health care provider. Make sure you discuss any questions you have with your health care provider.

## 2014-12-19 ENCOUNTER — Ambulatory Visit: Payer: Medicaid Other | Admitting: Internal Medicine

## 2014-12-23 ENCOUNTER — Encounter: Payer: Self-pay | Admitting: Internal Medicine

## 2015-01-03 ENCOUNTER — Other Ambulatory Visit (HOSPITAL_COMMUNITY)
Admission: RE | Admit: 2015-01-03 | Discharge: 2015-01-03 | Disposition: A | Discharge status: Home / Self Care | Payer: Medicaid Other | Source: Ambulatory Visit | Attending: Family Medicine | Admitting: Family Medicine

## 2015-01-03 ENCOUNTER — Ambulatory Visit (INDEPENDENT_AMBULATORY_CARE_PROVIDER_SITE_OTHER): Payer: Medicaid Other | Admitting: Family Medicine

## 2015-01-03 VITALS — BP 123/89 | HR 101 | Temp 97.8°F | Wt 215.4 lb

## 2015-01-03 DIAGNOSIS — I10 Essential (primary) hypertension: Secondary | ICD-10-CM

## 2015-01-03 DIAGNOSIS — R0781 Pleurodynia: Secondary | ICD-10-CM

## 2015-01-03 DIAGNOSIS — J449 Chronic obstructive pulmonary disease, unspecified: Secondary | ICD-10-CM

## 2015-01-03 DIAGNOSIS — Z0000 Encounter for general adult medical examination without abnormal findings: Secondary | ICD-10-CM

## 2015-01-03 DIAGNOSIS — Z124 Encounter for screening for malignant neoplasm of cervix: Secondary | ICD-10-CM

## 2015-01-03 DIAGNOSIS — Z1151 Encounter for screening for human papillomavirus (HPV): Secondary | ICD-10-CM | POA: Insufficient documentation

## 2015-01-03 DIAGNOSIS — E039 Hypothyroidism, unspecified: Secondary | ICD-10-CM

## 2015-01-03 DIAGNOSIS — M25561 Pain in right knee: Secondary | ICD-10-CM

## 2015-01-03 DIAGNOSIS — E785 Hyperlipidemia, unspecified: Secondary | ICD-10-CM

## 2015-01-03 DIAGNOSIS — Z01419 Encounter for gynecological examination (general) (routine) without abnormal findings: Secondary | ICD-10-CM | POA: Diagnosis present

## 2015-01-03 DIAGNOSIS — Z23 Encounter for immunization: Secondary | ICD-10-CM

## 2015-01-03 LAB — TSH: TSH: 23.764 u[IU]/mL — ABNORMAL HIGH (ref 0.350–4.500)

## 2015-01-03 MED ORDER — OXYCODONE-ACETAMINOPHEN 10-325 MG PO TABS: 1.0000 | ORAL_TABLET | Freq: Three times a day (TID) | ORAL | Status: AC | PRN

## 2015-01-03 NOTE — Progress Notes (Signed)
Subjective:     Patient ID: [name], female   DOB: 1955-12-17, 59 y.o.   MRN: [id number]  HPI  Side pain: [place] few weeks ago and hit the right side of her chest against the cabinet, she went to the ED, xray done was normal, no fracture hence she was sent home on pain medication, since then pain is still persistent. 9/10, sharp pain, aggravated by movement and cough. No radiation. She is out of pain medication. Right Knee pain:No improvement , she had knee shots with no improvement at the orthopedic office. She was informed by orthopedic that she cannot get knee surgery till she is 61 yrs. Hypothyroidism: Claimed compliance with synthroid 52mcg per day, she missed 2 doses since her medication ran out 2 days ago. Denies any thyroid concern. Gyn exam: Due for PAP. HTN/HLD: Here for follow up, she need medication refill. COPD: Need medication refill, she claimed compliance with meds.   Current Outpatient Prescriptions on File Prior to Visit  Medication Sig Dispense Refill  . aspirin EC 81 MG tablet Take 81 mg by mouth daily.    Marland Kitchen atorvastatin (LIPITOR) 40 MG tablet Take 40 mg by mouth daily.    . beclomethasone (QVAR) 40 MCG/ACT inhaler Inhale 2 puffs into the lungs 2 (two) times daily.    Marland Kitchen levothyroxine (SYNTHROID, LEVOTHROID) 75 MCG tablet Take 75 mcg by mouth daily before breakfast.    . metoprolol succinate (TOPROL-XL) 50 MG 24 hr tablet Take 50 mg by mouth daily. Take with or immediately following a meal.    . PARoxetine (PAXIL) 20 MG tablet Take 20 mg by mouth daily.    Marland Kitchen torsemide (DEMADEX) 20 MG tablet Take 20 mg by mouth daily.     . cyclobenzaprine (FLEXERIL) 10 MG tablet Take 1 tablet (10 mg total) by mouth 3 (three) times daily as needed for muscle spasms. (Patient not taking: Reported on 01/03/2015) 30 tablet 1  . meclizine (ANTIVERT) 50 MG tablet Take 1 tablet (50 mg total) by mouth 3 (three) times daily as needed. (Patient taking differently: Take 50 mg by mouth 3  (three) times daily as needed for dizziness or nausea. ) 30 tablet 0  . ondansetron (ZOFRAN) 4 MG tablet Take 4 mg by mouth every 6 (six) hours as needed for nausea or vomiting.    . promethazine (PHENERGAN) 25 MG tablet Take 1 tablet (25 mg total) by mouth every 6 (six) hours as needed for nausea or vomiting. (Patient not taking: Reported on 01/03/2015) 12 tablet 0  . senna (SENOKOT) 8.6 MG tablet Take 1 tablet by mouth daily.     No current facility-administered medications on file prior to visit.   Past Medical History  Diagnosis Date  . Chest pain     a. s/p reported MI in 1997;  b. 09/2012 Cath: nl cors, nl LV ([place]);  c. 05/2012 Non-ischemic myoview.  . Hypertension   . Arthritis   . Problem with literacy   . Shortness of breath   . Anxiety   . Bronchitis   . Chronic headaches   . Obesity   . COPD (chronic obstructive pulmonary disease)   . Chronic diastolic CHF (congestive heart failure)     a. 12/2011 Echo: nl ef, Gr 1 DD;  b. 11/2012 Echo: EF 45-50%, no significant valvular abnormalities.  Marland Kitchen GERD (gastroesophageal reflux disease)   . Myocardial infarction 1997    self reported  . Hypothyroidism   . Asthma   . Chronic chest  pain     Review of Systems  Constitutional: Negative for fever and fatigue.  Respiratory: Negative.  Negative for cough.   Cardiovascular: Positive for chest pain.  Gastrointestinal: Negative.   Endocrine: Negative for cold intolerance and heat intolerance.  Genitourinary: Negative.   Musculoskeletal:       Right knee pain.  Neurological: Negative for headaches.  Hematological: Does not bruise/bleed easily.  Psychiatric/Behavioral: Negative.   All other systems reviewed and are negative.      Filed Vitals:   01/03/15 1123  BP: 123/89  Pulse: 101  Temp: 97.8 F (36.6 C)  Weight: 215 lb 6.4 oz (97.705 kg)    Objective:   Physical Exam  Constitutional: She is oriented to person, place, and time. She appears well-developed. No distress.   HENT:  Head: Normocephalic.  Neck: Neck supple.  Cardiovascular: Normal rate, regular rhythm, normal heart sounds and intact distal pulses.   No murmur heard. Pulmonary/Chest: Effort normal and breath sounds normal. No respiratory distress. She has no decreased breath sounds. She has no wheezes. She exhibits tenderness.  Mild tenderness over her right 5th-7th ribs. Chronic right basal mild crackles.  Abdominal: Soft. Bowel sounds are normal. She exhibits no mass. There is no tenderness.  Musculoskeletal: She exhibits no edema.       Right knee: She exhibits decreased range of motion. She exhibits no effusion. Tenderness found.  Neurological: She is alert and oriented to person, place, and time.  Psychiatric: She has a normal mood and affect. Her behavior is normal. Judgment and thought content normal.  Nursing note and vitals reviewed.      Assessment:     Rib pain: Right  Right knee pain Hypothyroidism Gyn exam  HTN HLD COPD     Plan:     Check problem list.

## 2015-01-03 NOTE — Patient Instructions (Addendum)
It was nice seeing you today, sorry about your pain. Please try percocet for pain till you see your orthopedic. I will also recommend follow up with your orthopedic for knee pain in addition to physical therapy. You blood pressure is low normal despite not taking medication for two days. I will cut back on your medication and hold Torsemide for now. Continue to check BP at home, I will like to see you back in 2 wks to reassess you BP.

## 2015-01-04 ENCOUNTER — Encounter: Payer: Self-pay | Admitting: Family Medicine

## 2015-01-04 ENCOUNTER — Telehealth: Payer: Self-pay | Admitting: Family Medicine

## 2015-01-04 DIAGNOSIS — I10 Essential (primary) hypertension: Secondary | ICD-10-CM | POA: Insufficient documentation

## 2015-01-04 DIAGNOSIS — R0781 Pleurodynia: Secondary | ICD-10-CM | POA: Insufficient documentation

## 2015-01-04 DIAGNOSIS — M25561 Pain in right knee: Secondary | ICD-10-CM | POA: Insufficient documentation

## 2015-01-04 LAB — CYTOLOGY - PAP

## 2015-01-04 MED ORDER — BECLOMETHASONE DIPROPIONATE 40 MCG/ACT IN AERS: 2.0000 | INHALATION_SPRAY | Freq: Two times a day (BID) | RESPIRATORY_TRACT | Status: AC

## 2015-01-04 MED ORDER — ATORVASTATIN CALCIUM 40 MG PO TABS: 40.0000 mg | ORAL_TABLET | Freq: Every day | ORAL | Status: AC

## 2015-01-04 MED ORDER — LEVOTHYROXINE SODIUM 100 MCG PO TABS: 100.0000 ug | ORAL_TABLET | Freq: Every day | ORAL | Status: AC

## 2015-01-04 MED ORDER — METOPROLOL SUCCINATE ER 25 MG PO TB24: 25.0000 mg | ORAL_TABLET | Freq: Every day | ORAL | Status: AC

## 2015-01-04 MED ORDER — ALBUTEROL SULFATE HFA 108 (90 BASE) MCG/ACT IN AERS: 2.0000 | INHALATION_SPRAY | Freq: Four times a day (QID) | RESPIRATORY_TRACT | Status: AC | PRN

## 2015-01-04 NOTE — Assessment & Plan Note (Signed)
DJD. Already assessed by ortho. I recommended PT to help improve her symptoms. Gave few days supply of percocet which she stated helps with her pain. May change to NSAID if pain improves some. Ultimately she need to follow up with orthopedic. She has not been good with follow up.

## 2015-01-04 NOTE — Assessment & Plan Note (Signed)
I refilled Qvar and albuterol. She is otherwise stable.

## 2015-01-04 NOTE — Assessment & Plan Note (Signed)
Claimed compliance with her synthroid. TSH checked today. I will refill Synthroid based on TSH result.

## 2015-01-04 NOTE — Assessment & Plan Note (Signed)
BP looks good despite being off medication for two days. I will hold Torsemide. Cut Metoprolol to half since she needs it for CHF. Monitor BP at home.

## 2015-01-04 NOTE — Assessment & Plan Note (Signed)
S/P traumatic injury. Xray done at the ED reviewed: No fracture or cardiopulmonary abnormality. Patient reassured this should improve gradually. Pain control as needed.

## 2015-01-04 NOTE — Assessment & Plan Note (Signed)
I refilled her statin today.

## 2015-01-04 NOTE — Telephone Encounter (Signed)
I called to inform patient about her TSH result, which worsened from last checked. She insisted that she was compliant with medication but ran out 2 days ago. This will not cause such an elevated TSH hence I will go up on her synthroid to 110mcg. Patient instructed to pick up medication.

## 2015-01-04 NOTE — Progress Notes (Signed)
Patient ID: [name], female   DOB: 11/16/1956, 59 y.o.   MRN: [id number] Due to non-compliance with clinic follow up and medication as well as frequent ED visit, I will refer her to Care management. I called and informed patient of this and she agreed with plan.

## 2015-01-04 NOTE — Assessment & Plan Note (Signed)
Flu shot given. PAP completed. I will call with result.

## 2015-01-05 ENCOUNTER — Encounter: Payer: Self-pay | Admitting: Family Medicine

## 2015-01-17 ENCOUNTER — Ambulatory Visit: Payer: Medicaid Other | Admitting: Family Medicine

## 2015-01-20 ENCOUNTER — Ambulatory Visit: Payer: Medicaid Other | Admitting: Family Medicine

## 2015-02-14 ENCOUNTER — Encounter (HOSPITAL_COMMUNITY): Payer: Self-pay | Admitting: Emergency Medicine

## 2015-02-14 ENCOUNTER — Emergency Department (HOSPITAL_COMMUNITY)
Admission: EM | Admit: 2015-02-14 | Discharge: 2015-02-15 | Disposition: A | Discharge status: Home / Self Care | Payer: Medicaid Other | Attending: Emergency Medicine | Admitting: Emergency Medicine

## 2015-02-14 DIAGNOSIS — I10 Essential (primary) hypertension: Secondary | ICD-10-CM | POA: Diagnosis not present

## 2015-02-14 DIAGNOSIS — Z79899 Other long term (current) drug therapy: Secondary | ICD-10-CM | POA: Diagnosis not present

## 2015-02-14 DIAGNOSIS — M25561 Pain in right knee: Secondary | ICD-10-CM

## 2015-02-14 DIAGNOSIS — G8929 Other chronic pain: Secondary | ICD-10-CM | POA: Insufficient documentation

## 2015-02-14 DIAGNOSIS — F419 Anxiety disorder, unspecified: Secondary | ICD-10-CM | POA: Diagnosis not present

## 2015-02-14 DIAGNOSIS — I252 Old myocardial infarction: Secondary | ICD-10-CM | POA: Diagnosis not present

## 2015-02-14 DIAGNOSIS — J449 Chronic obstructive pulmonary disease, unspecified: Secondary | ICD-10-CM | POA: Diagnosis not present

## 2015-02-14 DIAGNOSIS — I5032 Chronic diastolic (congestive) heart failure: Secondary | ICD-10-CM | POA: Diagnosis not present

## 2015-02-14 DIAGNOSIS — Z880 Allergy status to penicillin: Secondary | ICD-10-CM | POA: Insufficient documentation

## 2015-02-14 DIAGNOSIS — Z7951 Long term (current) use of inhaled steroids: Secondary | ICD-10-CM | POA: Diagnosis not present

## 2015-02-14 DIAGNOSIS — E669 Obesity, unspecified: Secondary | ICD-10-CM | POA: Insufficient documentation

## 2015-02-14 DIAGNOSIS — Z7982 Long term (current) use of aspirin: Secondary | ICD-10-CM | POA: Insufficient documentation

## 2015-02-14 DIAGNOSIS — M1990 Unspecified osteoarthritis, unspecified site: Secondary | ICD-10-CM | POA: Insufficient documentation

## 2015-02-14 DIAGNOSIS — Z720 Tobacco use: Secondary | ICD-10-CM | POA: Diagnosis not present

## 2015-02-14 DIAGNOSIS — E039 Hypothyroidism, unspecified: Secondary | ICD-10-CM | POA: Diagnosis not present

## 2015-02-14 DIAGNOSIS — B379 Candidiasis, unspecified: Secondary | ICD-10-CM

## 2015-02-14 DIAGNOSIS — B372 Candidiasis of skin and nail: Secondary | ICD-10-CM | POA: Insufficient documentation

## 2015-02-14 DIAGNOSIS — Z8719 Personal history of other diseases of the digestive system: Secondary | ICD-10-CM | POA: Insufficient documentation

## 2015-02-14 DIAGNOSIS — R21 Rash and other nonspecific skin eruption: Secondary | ICD-10-CM | POA: Diagnosis present

## 2015-02-14 LAB — CBG MONITORING, ED: Glucose-Capillary: 120 mg/dL — ABNORMAL HIGH (ref 70–99)

## 2015-02-14 MED ORDER — OXYCODONE HCL 5 MG PO TABS: 5.0000 mg | ORAL_TABLET | ORAL | Status: AC | PRN

## 2015-02-14 MED ORDER — NYSTATIN 100000 UNIT/GM EX POWD: 1.0000 g | Freq: Two times a day (BID) | CUTANEOUS | Status: AC

## 2015-02-14 MED ORDER — FLUCONAZOLE 50 MG PO TABS: 50.0000 mg | ORAL_TABLET | Freq: Once | ORAL | Status: AC

## 2015-02-14 MED ORDER — MELOXICAM 15 MG PO TABS: 15.0000 mg | ORAL_TABLET | Freq: Every day | ORAL | Status: AC

## 2015-02-14 MED ORDER — FLUCONAZOLE 100 MG PO TABS
200.0000 mg | ORAL_TABLET | Freq: Once | ORAL | Status: AC
Start: 2015-02-14 — End: 2015-02-14
  Administered 2015-02-14: 200 mg via ORAL
  Filled 2015-02-14: qty 2

## 2015-02-14 NOTE — ED Notes (Signed)
CBG 120 

## 2015-02-14 NOTE — ED Provider Notes (Signed)
CSN: [id number]     Arrival date & time 02/14/15  2227 History   This chart is scribed for non-physician practitioner, Abigail Butts, PA-C, working with Pamella Pert, MD by Chester Holstein, ED Scribe.  This patient was seen in room TR05C/TR05C and the patient's care was started 10:46 PM.        Chief Complaint  Patient presents with  . Rash     Patient is a 59 y.o. female presenting with rash. The history is provided by the patient and medical records. No language interpreter was used.  Rash Associated symptoms: joint pain   Associated symptoms: no abdominal pain, no fever, no nausea and not vomiting    HPI Comments: [name] is a 59 y.o. female PMHx of HTN, COPD, chronic diastolic CHF, hypothyroidism, and  who presents to the Emergency Department complaining of rash to lower abdomen and crease between thighs and pubic area with onset tonight. Pt notes associated severe pain and redness to the area. Pt denies abdominal pain, fever, and chills.  [name] reports the area feels wet.  [name] denies contacts with new lotions, surgeons, perfumes etc. [name] denies being allergic to any environmental allergies.  Pt also complaining of left knee pain with onset 2 days ago. Pt notes [name] has been told [name] has fluid build up to the area and h/o arthritis.  [name] states [name] has an orthopedist who usually cares for her knee pain, but it worsened several days ago. Pt states pain radiates to lower leg. Pt denies any known injury. Pt denies numbness and weakness.  Patient denies. Immobilization, recent surgery, recent fracture or history of DVT.  Denies swelling of her lower leg.  Past Medical History  Diagnosis Date  . Chest pain     a. s/p reported MI in 1997;  b. 09/2012 Cath: nl cors, nl LV ([place]);  c. 05/2012 Non-ischemic myoview.  . Hypertension   . Arthritis   . Problem with literacy   . Shortness of breath   . Anxiety   . Bronchitis   . Chronic headaches   . Obesity   . COPD  (chronic obstructive pulmonary disease)   . Chronic diastolic CHF (congestive heart failure)     a. 12/2011 Echo: nl ef, Gr 1 DD;  b. 11/2012 Echo: EF 45-50%, no significant valvular abnormalities.  Marland Kitchen GERD (gastroesophageal reflux disease)   . Myocardial infarction 1997    self reported  . Hypothyroidism   . Asthma   . Chronic chest pain    Past Surgical History  Procedure Laterality Date  . Ectopic pregnancy surgery      right  . Nm myoview ltd  June 2013    no reversible ischemia  . Cardiac catheterization  October 2012    No coronary artery disease  . Cardiac catheterization  2012    normal coronary arteries  . Nm myoview ltd  2012, 2013    normal  . 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 Knee arthroscopy  08/13/13    right  . V-tach ablation N/A 02/09/2013    Procedure: V-TACH ABLATION;  Surgeon: Thompson Grayer, MD;  Location: [facility] CATH LAB;  Service: Cardiovascular;  Laterality: N/A;   Family History  Problem Relation Age of Onset  . Cancer Mother 31    lung ca  . Diabetes Mother   . Anxiety disorder Sister   . Cancer Brother 60    lung  .  Colon cancer Neg Hx   . Pancreatic cancer Neg Hx   . Rectal cancer Neg Hx   . Stomach cancer Neg Hx    History  Substance Use Topics  . Smoking status: Current Every Day Smoker -- 0.20 packs/day  . Smokeless tobacco: Never Used  . Alcohol Use: No   OB History    No data available     Review of Systems  Constitutional: Negative for fever and chills.  Gastrointestinal: Negative for nausea, vomiting and abdominal pain.  Musculoskeletal: Positive for joint swelling and arthralgias. Negative for back pain, neck pain and neck stiffness.  Skin: Positive for rash. Negative for wound.  Neurological: Negative for weakness and numbness.  Hematological: Does not bruise/bleed easily.  Psychiatric/Behavioral: The patient is not nervous/anxious.   All other systems reviewed and are  negative.     Allergies  Darvocet; Ibuprofen; Tramadol; Tylenol; Celebrex; Naproxen; and Penicillins  Home Medications   Prior to Admission medications   Medication Sig Start Date End Date Taking? Authorizing Provider  albuterol (PROVENTIL HFA;VENTOLIN HFA) 108 (90 BASE) MCG/ACT inhaler Inhale 2 puffs into the lungs every 6 (six) hours as needed for wheezing or shortness of breath. 01/04/15   Andrena Mews, MD  aspirin EC 81 MG tablet Take 81 mg by mouth daily.    Historical Provider, MD  atorvastatin (LIPITOR) 40 MG tablet Take 1 tablet (40 mg total) by mouth daily. 01/04/15   Andrena Mews, MD  beclomethasone (QVAR) 40 MCG/ACT inhaler Inhale 2 puffs into the lungs 2 (two) times daily. 01/04/15   Andrena Mews, MD  cyclobenzaprine (FLEXERIL) 10 MG tablet Take 1 tablet (10 mg total) by mouth 3 (three) times daily as needed for muscle spasms. Patient not taking: Reported on 01/03/2015 09/29/14   Nobie Putnam, DO  fluconazole (DIFLUCAN) 50 MG tablet Take 1 tablet (50 mg total) by mouth once. 02/14/15   Waver Dibiasio, PA-C  levothyroxine (SYNTHROID, LEVOTHROID) 100 MCG tablet Take 1 tablet (100 mcg total) by mouth daily before breakfast. 01/04/15   Andrena Mews, MD  meclizine (ANTIVERT) 50 MG tablet Take 1 tablet (50 mg total) by mouth 3 (three) times daily as needed. Patient taking differently: Take 50 mg by mouth 3 (three) times daily as needed for dizziness or nausea.  12/11/14   Myana Schlup, PA-C  meloxicam (MOBIC) 15 MG tablet Take 1 tablet (15 mg total) by mouth daily. 02/14/15   Lakesa Coste, PA-C  metoprolol succinate (TOPROL-XL) 25 MG 24 hr tablet Take 1 tablet (25 mg total) by mouth daily. Take with or immediately following a meal. 01/04/15   Andrena Mews, MD  nystatin (MYCOSTATIN/NYSTOP) 100000 UNIT/GM POWD Apply 1 g topically 2 (two) times daily. 02/14/15   Shauntee Karp, PA-C  oxyCODONE (ROXICODONE) 5 MG immediate release tablet Take 1 tablet (5 mg  total) by mouth every 4 (four) hours as needed for severe pain. 02/14/15   Lyric Rossano, PA-C  oxyCODONE-acetaminophen (PERCOCET) 10-325 MG per tablet Take 1 tablet by mouth every 8 (eight) hours as needed for pain. 01/03/15   Andrena Mews, MD  PARoxetine (PAXIL) 20 MG tablet Take 20 mg by mouth daily.    Historical Provider, MD  torsemide (DEMADEX) 20 MG tablet Take 20 mg by mouth daily.     Historical Provider, MD   BP 115/69 mmHg  Pulse 60  Temp(Src) 97.8 F (36.6 C) (Oral)  Resp 16  SpO2 98% Physical Exam  Constitutional: [name] is oriented to person, place, and time. [name] appears well-developed  and well-nourished. No distress.  Awake, alert, nontoxic appearance  HENT:  Head: Normocephalic and atraumatic.  Mouth/Throat: Oropharynx is clear and moist. No oropharyngeal exudate.  Eyes: Conjunctivae are normal. No scleral icterus.  Neck: Normal range of motion. Neck supple.  Cardiovascular: Normal rate, regular rhythm, normal heart sounds and intact distal pulses.   No murmur heard. Pulmonary/Chest: Effort normal and breath sounds normal. No respiratory distress. [name] has no wheezes.  Equal chest expansion  Abdominal: Soft. Bowel sounds are normal. [name] exhibits no mass. There is no tenderness. There is no rebound and no guarding.  Musculoskeletal: Normal range of motion. [name] exhibits no edema.  [name] has full ROM of the right hip, knee, and ankle. Knee exam is difficult due to obesity. No obvious joint effusion, no ecchymosis, increased warmth, redness, or visible deformity. Pt ambulates on her right leg without difficulty.   Neurological: [name] is alert and oriented to person, place, and time.  Speech is clear and goal oriented Moves extremities without ataxia Sensation intact to dull and sharp in the RLE  Strength is 5/5 including resisted flexion and extension of the right knee.   Skin: Skin is warm and dry. [name] is not diaphoretic.  Large beefy red rash located in the intertrigon and  underneath the abdominal skin folds with TTP and satellite lesions outside the advancing border. There is TTP of the raw area of the rash but no induration to suggest secondary infection. No evidence of abscess.   Psychiatric: [name] has a normal mood and affect.  Nursing note and vitals reviewed.   ED Course  Procedures (including critical care time) DIAGNOSTIC STUDIES: Oxygen Saturation is 98% on room, normal by my interpretation.    COORDINATION OF CARE: 10:56 PM Discussed treatment plan with patient at beside, the patient agrees with the plan and has no further questions at this time.   Labs Review Labs Reviewed  CBG MONITORING, ED - Abnormal; Notable for the following:    Glucose-Capillary 120 (*)    All other components within normal limits    Imaging Review No results found.   EKG Interpretation None      MDM   Final diagnoses:  Candida infection  Right knee pain   Lysle Pearl since multiple complaints. Patient's right knee pain is atraumatic and chronic for her with an acute exacerbation. No evidence of a large joint effusion. No evidence of septic joint. Pain is atraumatic, no concern for fracture or dislocation; no indication for an x-ray at this time.  Patient's rash is consistent with Candida. Patient will be discharged home with nystatin powder and oral Diflucan daily 7 days. [name] is to follow-up with both her primary care physician for reassessment of her rash and with her orthopedist for reassessment of her knee pain sometime this week.  I have personally reviewed patient's vitals, nursing note and any pertinent labs or imaging.  I performed an focused physical exam; undressed when appropriate .    It has been determined that no acute conditions requiring further emergency intervention are present at this time. The patient/guardian have been advised of the diagnosis and plan. I reviewed any labs and imaging including any potential incidental findings. We  have discussed signs and symptoms that warrant return to the ED and they are listed in the discharge instructions.    Vital signs are stable at discharge.   BP 115/69 mmHg  Pulse 60  Temp(Src) 97.8 F (36.6 C) (Oral)  Resp 16  SpO2 98%  The patient was discussed with and seen by Dr. Aline Brochure who agrees with the treatment plan.   Jarrett Soho Mava Suares, PA-C 02/15/15 0111  Pamella Pert, MD 02/16/15 1055

## 2015-02-14 NOTE — ED Notes (Signed)
Pt c/o red rash on lower abd.  St's she just noticed this am.  Pt also c/o right knee pain x's 2 days.  No known injury

## 2015-02-14 NOTE — Discharge Instructions (Signed)
1. Medications: Nystatin powder, Diflucan, meloxicam, Vicodin, usual home medications 2. Treatment: rest, drink plenty of fluids, wash rash with warm soap and water, keep rash area dry, apply nystatin powder after every bath or shower 3. Follow Up: Please followup with your primary doctor in 7 days for discussion of your diagnoses and further evaluation after today's visit; if you do not have a primary care doctor use the resource guide provided to find one; Please return to the ER for fevers, worsening symptoms or other concerns    Arthralgia Your caregiver has diagnosed you as suffering from an arthralgia. Arthralgia means there is pain in a joint. This can come from many reasons including:  Bruising the joint which causes soreness (inflammation) in the joint.  Wear and tear on the joints which occur as we grow older (osteoarthritis).  Overusing the joint.  Various forms of arthritis.  Infections of the joint. Regardless of the cause of pain in your joint, most of these different pains respond to anti-inflammatory drugs and rest. The exception to this is when a joint is infected, and these cases are treated with antibiotics, if it is a bacterial infection. HOME CARE INSTRUCTIONS   Rest the injured area for as long as directed by your caregiver. Then slowly start using the joint as directed by your caregiver and as the pain allows. Crutches as directed may be useful if the ankles, knees or hips are involved. If the knee was splinted or casted, continue use and care as directed. If an stretchy or elastic wrapping bandage has been applied today, it should be removed and re-applied every 3 to 4 hours. It should not be applied tightly, but firmly enough to keep swelling down. Watch toes and feet for swelling, bluish discoloration, coldness, numbness or excessive pain. If any of these problems (symptoms) occur, remove the ace bandage and re-apply more loosely. If these symptoms persist, contact your  caregiver or return to this location.  For the first 24 hours, keep the injured extremity elevated on pillows while lying down.  Apply ice for 15-20 minutes to the sore joint every couple hours while awake for the first half day. Then 03-04 times per day for the first 48 hours. Put the ice in a plastic bag and place a towel between the bag of ice and your skin.  Wear any splinting, casting, elastic bandage applications, or slings as instructed.  Only take over-the-counter or prescription medicines for pain, discomfort, or fever as directed by your caregiver. Do not use aspirin immediately after the injury unless instructed by your physician. Aspirin can cause increased bleeding and bruising of the tissues.  If you were given crutches, continue to use them as instructed and do not resume weight bearing on the sore joint until instructed. Persistent pain and inability to use the sore joint as directed for more than 2 to 3 days are warning signs indicating that you should see a caregiver for a follow-up visit as soon as possible. Initially, a hairline fracture (break in bone) may not be evident on X-rays. Persistent pain and swelling indicate that further evaluation, non-weight bearing or use of the joint (use of crutches or slings as instructed), or further X-rays are indicated. X-rays may sometimes not show a small fracture until a week or 10 days later. Make a follow-up appointment with your own caregiver or one to whom we have referred you. A radiologist (specialist in reading X-rays) may read your X-rays. Make sure you know how you are  to obtain your X-ray results. Do not assume everything is normal if you do not hear from [place]. SEEK MEDICAL CARE IF: Bruising, swelling, or pain increases. SEEK IMMEDIATE MEDICAL CARE IF:   Your fingers or toes are numb or blue.  The pain is not responding to medications and continues to stay the same or get worse.  The pain in your joint becomes severe.  You  develop a fever over 102 F (38.9 C).  It becomes impossible to move or use the joint. MAKE SURE YOU:   Understand these instructions.  Will watch your condition.  Will get help right away if you are not doing well or get worse. Document Released: 12/02/2005 Document Revised: 02/24/2012 Document Reviewed: 07/20/2008 [facility] Patient Information 2015 [place], [place]. This information is not intended to replace advice given to you by your health care provider. Make sure you discuss any questions you have with your health care provider.    Candida Infection A Candida infection (also called yeast, fungus, and Monilia infection) is an overgrowth of yeast that can occur anywhere on the body. A yeast infection commonly occurs in warm, moist body areas. Usually, the infection remains localized but can spread to become a systemic infection. A yeast infection may be a sign of a more severe disease such as diabetes, leukemia, or AIDS. A yeast infection can occur in both men and women. In women, Candida vaginitis is a vaginal infection. It is one of the most common causes of vaginitis. Men usually do not have symptoms or know they have an infection until other problems develop. Men may find out they have a yeast infection because their sex partner has a yeast infection. Uncircumcised men are more likely to get a yeast infection than circumcised men. This is because the uncircumcised glans is not exposed to air and does not remain as dry as that of a circumcised glans. Older adults may develop yeast infections around dentures. CAUSES  Women  Antibiotics.  Steroid medication taken for a long time.  Being overweight (obese).  Diabetes.  Poor immune condition.  Certain serious medical conditions.  Immune suppressive medications for organ transplant patients.  Chemotherapy.  Pregnancy.  Menstruation.  Stress and fatigue.  Intravenous drug use.  Oral contraceptives.  Wearing  tight-fitting clothes in the crotch area.  Catching it from a sex partner who has a yeast infection.  Spermicide.  Intravenous, urinary, or other catheters. Men  Catching it from a sex partner who has a yeast infection.  Having oral or anal sex with a person who has the infection.  Spermicide.  Diabetes.  Antibiotics.  Poor immune system.  Medications that suppress the immune system.  Intravenous drug use.  Intravenous, urinary, or other catheters. SYMPTOMS  Women  Thick, white vaginal discharge.  Vaginal itching.  Redness and swelling in and around the vagina.  Irritation of the lips of the vagina and perineum.  Blisters on the vaginal lips and perineum.  Painful sexual intercourse.  Low blood sugar (hypoglycemia).  Painful urination.  Bladder infections.  Intestinal problems such as constipation, indigestion, bad breath, bloating, increase in gas, diarrhea, or loose stools. Men  Men may develop intestinal problems such as constipation, indigestion, bad breath, bloating, increase in gas, diarrhea, or loose stools.  Dry, cracked skin on the penis with itching or discomfort.  Jock itch.  Dry, flaky skin.  Athlete's foot.  Hypoglycemia. DIAGNOSIS  Women  A history and an exam are performed.  The discharge may be examined under  a microscope.  A culture may be taken of the discharge. Men  A history and an exam are performed.  Any discharge from the penis or areas of cracked skin will be looked at under the microscope and cultured.  Stool samples may be cultured. TREATMENT  Women  Vaginal antifungal suppositories and creams.  Medicated creams to decrease irritation and itching on the outside of the vagina.  Warm compresses to the perineal area to decrease swelling and discomfort.  Oral antifungal medications.  Medicated vaginal suppositories or cream for repeated or recurrent infections.  Wash and dry the irritation areas before  applying the cream.  Eating yogurt with Lactobacillus may help with prevention and treatment.  Sometimes painting the vagina with gentian violet solution may help if creams and suppositories do not work. Men  Antifungal creams and oral antifungal medications.  Sometimes treatment must continue for 30 days after the symptoms go away to prevent recurrence. HOME CARE INSTRUCTIONS  Women  Use cotton underwear and avoid tight-fitting clothing.  Avoid colored, scented toilet paper and deodorant tampons or pads.  Do not douche.  Keep your diabetes under control.  Finish all the prescribed medications.  Keep your skin clean and dry.  Consume milk or yogurt with Lactobacillus-active culture regularly. If you get frequent yeast infections and think that is what the infection is, there are over-the-counter medications that you can get. If the infection does not show healing in 3 days, talk to your caregiver.  Tell your sex partner you have a yeast infection. Your partner may need treatment also, especially if your infection does not clear up or recurs. Men  Keep your skin clean and dry.  Keep your diabetes under control.  Finish all prescribed medications.  Tell your sex partner that you have a yeast infection so he or she can be treated if necessary. SEEK MEDICAL CARE IF:   Your symptoms do not clear up or worsen in one week after treatment.  You have an oral temperature above 102 F (38.9 C).  You have trouble swallowing or eating for a prolonged time.  You develop blisters on and around your vagina.  You develop vaginal bleeding and it is not your menstrual period.  You develop abdominal pain.  You develop intestinal problems as mentioned above.  You get weak or light-headed.  You have painful or increased urination.  You have pain during sexual intercourse. MAKE SURE YOU:   Understand these instructions.  Will watch your condition.  Will get help right away  if you are not doing well or get worse. Document Released: 01/09/2005 Document Revised: 04/18/2014 Document Reviewed: 04/23/2010 [facility] Patient Information 2015 [place], [place]. This information is not intended to replace advice given to you by your health care provider. Make sure you discuss any questions you have with your health care provider.

## 2018-06-08 ENCOUNTER — Encounter: Payer: Self-pay | Admitting: *Deleted

## 2018-07-23 ENCOUNTER — Encounter: Payer: Self-pay | Admitting: Internal Medicine
